# Patient Record
Sex: Male | Born: 1955
Health system: Southern US, Community
[De-identification: ages and names within clinical notes are randomized; demographics above are authoritative.]

## PROBLEM LIST (undated history)

## (undated) ENCOUNTER — Inpatient Hospital Stay: Admission: EM | Payer: Self-pay

## (undated) DIAGNOSIS — E119 Type 2 diabetes mellitus without complications: Secondary | ICD-10-CM

## (undated) DIAGNOSIS — G4730 Sleep apnea, unspecified: Secondary | ICD-10-CM

## (undated) DIAGNOSIS — E785 Hyperlipidemia, unspecified: Secondary | ICD-10-CM

## (undated) DIAGNOSIS — F17200 Nicotine dependence, unspecified, uncomplicated: Secondary | ICD-10-CM

## (undated) DIAGNOSIS — G629 Polyneuropathy, unspecified: Secondary | ICD-10-CM

## (undated) DIAGNOSIS — C3490 Malignant neoplasm of unspecified part of unspecified bronchus or lung: Secondary | ICD-10-CM

## (undated) DIAGNOSIS — I10 Essential (primary) hypertension: Secondary | ICD-10-CM

## (undated) DIAGNOSIS — C801 Malignant (primary) neoplasm, unspecified: Secondary | ICD-10-CM

## (undated) DIAGNOSIS — G709 Myoneural disorder, unspecified: Secondary | ICD-10-CM

## (undated) HISTORY — DX: Sleep apnea, unspecified: G47.30

## (undated) HISTORY — DX: Hyperlipidemia, unspecified: E78.5

## (undated) HISTORY — DX: Type 2 diabetes mellitus without complications: E11.9

## (undated) HISTORY — PX: COLONOSCOPY: SHX174

## (undated) HISTORY — DX: Malignant (primary) neoplasm, unspecified: C80.1

## (undated) HISTORY — PX: PROSTATECTOMY: SHX69

## (undated) HISTORY — DX: Nicotine dependence, unspecified, uncomplicated: F17.200

## (undated) HISTORY — DX: Essential (primary) hypertension: I10

## (undated) HISTORY — PX: BACK SURGERY: SHX140

## (undated) HISTORY — DX: Myoneural disorder, unspecified: G70.9

---

## 2002-08-03 ENCOUNTER — Ambulatory Visit (HOSPITAL_BASED_OUTPATIENT_CLINIC_OR_DEPARTMENT_OTHER): Admission: RE | Admit: 2002-08-03 | Discharge: 2002-08-03 | Payer: Self-pay | Admitting: Internal Medicine

## 2003-06-13 ENCOUNTER — Ambulatory Visit (HOSPITAL_BASED_OUTPATIENT_CLINIC_OR_DEPARTMENT_OTHER): Admission: RE | Admit: 2003-06-13 | Discharge: 2003-06-13 | Payer: Self-pay | Admitting: Orthopedic Surgery

## 2006-01-28 ENCOUNTER — Encounter: Admission: RE | Admit: 2006-01-28 | Discharge: 2006-01-28 | Payer: Self-pay | Admitting: Internal Medicine

## 2006-07-25 ENCOUNTER — Encounter: Admission: RE | Admit: 2006-07-25 | Discharge: 2006-07-25 | Payer: Self-pay | Admitting: Internal Medicine

## 2009-09-30 ENCOUNTER — Encounter: Admission: RE | Admit: 2009-09-30 | Discharge: 2009-09-30 | Payer: Self-pay | Admitting: Internal Medicine

## 2010-08-05 ENCOUNTER — Encounter (INDEPENDENT_AMBULATORY_CARE_PROVIDER_SITE_OTHER): Payer: Self-pay | Admitting: Urology

## 2010-08-05 ENCOUNTER — Inpatient Hospital Stay (HOSPITAL_COMMUNITY)
Admission: RE | Admit: 2010-08-05 | Discharge: 2010-08-07 | Payer: Self-pay | Source: Home / Self Care | Attending: Urology | Admitting: Urology

## 2010-09-22 ENCOUNTER — Encounter: Payer: Self-pay | Admitting: Internal Medicine

## 2010-09-22 ENCOUNTER — Ambulatory Visit (INDEPENDENT_AMBULATORY_CARE_PROVIDER_SITE_OTHER): Payer: PRIVATE HEALTH INSURANCE | Admitting: Internal Medicine

## 2010-09-22 VITALS — BP 120/82 | HR 86 | Ht 68.5 in | Wt 189.0 lb

## 2010-09-22 DIAGNOSIS — E785 Hyperlipidemia, unspecified: Secondary | ICD-10-CM

## 2010-09-22 DIAGNOSIS — E1169 Type 2 diabetes mellitus with other specified complication: Secondary | ICD-10-CM | POA: Insufficient documentation

## 2010-09-22 DIAGNOSIS — F17200 Nicotine dependence, unspecified, uncomplicated: Secondary | ICD-10-CM | POA: Insufficient documentation

## 2010-09-22 DIAGNOSIS — Z79899 Other long term (current) drug therapy: Secondary | ICD-10-CM

## 2010-09-22 DIAGNOSIS — E781 Pure hyperglyceridemia: Secondary | ICD-10-CM

## 2010-09-22 DIAGNOSIS — Z9889 Other specified postprocedural states: Secondary | ICD-10-CM | POA: Insufficient documentation

## 2010-09-22 DIAGNOSIS — C61 Malignant neoplasm of prostate: Secondary | ICD-10-CM | POA: Insufficient documentation

## 2010-09-22 DIAGNOSIS — G5600 Carpal tunnel syndrome, unspecified upper limb: Secondary | ICD-10-CM | POA: Insufficient documentation

## 2010-09-22 LAB — BASIC METABOLIC PANEL
BUN: 12 mg/dL (ref 6–23)
CO2: 30 mEq/L (ref 19–32)
Calcium: 9.4 mg/dL (ref 8.4–10.5)
Chloride: 106 mEq/L (ref 96–112)
Creatinine, Ser: 0.9 mg/dL (ref 0.4–1.5)
GFR: 92.93 mL/min (ref >60.00)
Glucose, Bld: 155 mg/dL — ABNORMAL HIGH (ref 70–99)
Potassium: 4.2 mEq/L (ref 3.5–5.1)
Sodium: 142 mEq/L (ref 135–145)

## 2010-09-22 LAB — HEPATIC FUNCTION PANEL
ALT: 26 U/L (ref 0–53)
AST: 25 U/L (ref 0–37)
Albumin: 4.4 g/dL (ref 3.5–5.2)
Alkaline Phosphatase: 76 U/L (ref 39–117)
Bilirubin, Direct: 0 mg/dL (ref 0.0–0.3)
Total Bilirubin: 0.2 mg/dL — ABNORMAL LOW (ref 0.3–1.2)
Total Protein: 7.2 g/dL (ref 6.0–8.3)

## 2010-09-22 LAB — LIPID PANEL
Cholesterol: 114 mg/dL (ref 0–200)
HDL: 18.3 mg/dL — ABNORMAL LOW (ref >39.00)
Total CHOL/HDL Ratio: 6
Triglycerides: 816 mg/dL — ABNORMAL HIGH (ref 0.0–149.0)
VLDL: 163.2 mg/dL — ABNORMAL HIGH (ref 0.0–40.0)

## 2010-09-22 LAB — LDL CHOLESTEROL, DIRECT: Direct LDL: 25 mg/dL

## 2010-09-22 MED ORDER — ROSUVASTATIN CALCIUM 10 MG PO TABS: 10.0000 mg | ORAL_TABLET | Freq: Every day | ORAL | Status: DC

## 2010-09-22 NOTE — Assessment & Plan Note (Signed)
Obtain fasting lipid profile and liver function tests. Cost may be a consideration for Crestor according to patient. Consider possible Lipitor if Crestor is too expensive. Continue Fish oil.  Recommend low fat diet and regular aerobic exercise thirty minutes at least four days a week. Obtain Chem-7.

## 2010-09-22 NOTE — Assessment & Plan Note (Signed)
Long discussion held. Tobacco cessation reviewed and recommended. Declines Chantix. Discussed Zyban. Wishes to attempt nicotine patches. Instructed to avoid smoking while using patches.

## 2010-09-22 NOTE — Assessment & Plan Note (Signed)
Stable. Status post recent prostatectomy with undetectable PSA. Continue surveillance per urology.

## 2010-09-22 NOTE — Progress Notes (Signed)
  Subjective:    Patient ID: Gregory Jensen, male    DOB: 1955/11/03, 55 y.o.   MRN: [id number]  HPI  Patient presents to clinic to establish primary medical care and for followup of hyperlipidemia. Has long-standing history of hyperlipidemia primarily hypertriglyceridemia currently taking Crestor 10 mg a day with samples. Also takes  Fish oil  Approximately 2400 mg daily. States untreated his triglycerides are approximately 3000 and treated  are approximately five hundred-eight hundred.  Tolerates statin therapy without myalgias or abnormal LFTs. T-System approximately one and half packs a serous daily but is ready for cessation. Wishes to avoid Chantix. States had unremarkable chest x-ray approximately 5 months ago. Believes underwent colonoscopy approximately age 12 without polyps and no family history of colon cancer.  Recently diagnosed with prostate cancer and underwent prostatectomy in December 2011. Followed closely by urology and recalls undetectable PSA within the past week.     reviewed past medical history, problem list, medications, allergies, family history and social history  Review of Systems  Constitutional: Negative for fever, chills, activity change and fatigue.  HENT: Negative for hearing loss, congestion and voice change.   Eyes: Negative for pain, discharge and redness.  Respiratory: Negative for cough, shortness of breath and wheezing.   Cardiovascular: Negative for chest pain and palpitations.  Gastrointestinal: Negative for abdominal pain, diarrhea, constipation and blood in stool.  Musculoskeletal: Positive for back pain. Negative for joint swelling and arthralgias.  Skin: Negative for color change and rash.  Neurological: Negative for dizziness, seizures, syncope and headaches.  Hematological: Negative for adenopathy. Does not bruise/bleed easily.  Psychiatric/Behavioral: Negative for behavioral problems and agitation.       Objective:   Physical Exam    Constitutional: He appears well-developed and well-nourished. No distress.  HENT:  Head: Normocephalic and atraumatic.  Right Ear: External ear normal.  Left Ear: External ear normal.  Nose: Nose normal.  Mouth/Throat: Oropharynx is clear and moist. No oropharyngeal exudate.  Eyes: EOM are normal. Pupils are equal, round, and reactive to light. Right eye exhibits no discharge. Left eye exhibits no discharge. No scleral icterus.  Neck: Normal range of motion. Neck supple. No JVD present.  Cardiovascular: Normal rate, regular rhythm and normal heart sounds.  Exam reveals no gallop and no friction rub.   No murmur heard. Pulmonary/Chest: No respiratory distress. He has no wheezes. He has no rales.  Abdominal: Soft. He exhibits no distension and no mass. There is no tenderness. There is no rebound and no guarding.  Musculoskeletal: Normal range of motion.  Lymphadenopathy:    He has no cervical adenopathy.  Neurological: He is alert.  Skin: Skin is warm and dry. No rash noted. He is not diaphoretic. No erythema. No pallor.  Psychiatric: He has a normal mood and affect. His behavior is normal.          Assessment & Plan:

## 2010-09-24 ENCOUNTER — Telehealth: Payer: Self-pay

## 2010-09-24 NOTE — Telephone Encounter (Signed)
Message copied by Kyung Rudd on Thu Sep 24, 2010  4:49 PM ------      Message from: Letitia Libra, Maisie Fus      Created: Thu Sep 24, 2010  4:28 PM       TG 800. Cont crestor. inrease fish oil to at least 3000mg  daily. (believe is taking around 2000mg ). Blood sugar mildly high. Low sugar/carb diet and regular exercise at least 3x/week. Will recheck sugar at next visit

## 2010-09-24 NOTE — Telephone Encounter (Signed)
Pt aware and verbalized understanding.  

## 2010-10-19 ENCOUNTER — Telehealth: Payer: Self-pay | Admitting: Internal Medicine

## 2010-10-19 NOTE — Telephone Encounter (Signed)
Pt would like to have a cheaper med to replace Crestor sent to [facility] [place]... Pt has taken Simvastatin before and it worked well.... Could it be switched to this?  Pt can be reached at [phone].

## 2010-10-20 MED ORDER — ATORVASTATIN CALCIUM 40 MG PO TABS: 40.0000 mg | ORAL_TABLET | Freq: Every day | ORAL | Status: DC

## 2010-10-20 NOTE — Telephone Encounter (Signed)
Recommend lipitor 40mg  qd.

## 2010-10-20 NOTE — Telephone Encounter (Signed)
Med escribed to [facility]. Pt aware

## 2010-10-26 LAB — DIFFERENTIAL
Basophils Absolute: 0 10*3/uL (ref 0.0–0.1)
Basophils Absolute: 0.1 10*3/uL (ref 0.0–0.1)
Basophils Relative: 0 % (ref 0–1)
Basophils Relative: 0 % (ref 0–1)
Eosinophils Absolute: 0 10*3/uL (ref 0.0–0.7)
Eosinophils Absolute: 0 10*3/uL (ref 0.0–0.7)
Eosinophils Relative: 0 % (ref 0–5)
Eosinophils Relative: 0 % (ref 0–5)
Lymphocytes Relative: 13 % (ref 12–46)
Lymphocytes Relative: 15 % (ref 12–46)
Lymphs Abs: 1.6 10*3/uL (ref 0.7–4.0)
Lymphs Abs: 2 10*3/uL (ref 0.7–4.0)
Monocytes Absolute: 0.2 10*3/uL (ref 0.1–1.0)
Monocytes Absolute: 0.7 10*3/uL (ref 0.1–1.0)
Monocytes Relative: 2 % — ABNORMAL LOW (ref 3–12)
Monocytes Relative: 6 % (ref 3–12)
Neutro Abs: 11.3 10*3/uL — ABNORMAL HIGH (ref 1.7–7.7)
Neutro Abs: 9.8 10*3/uL — ABNORMAL HIGH (ref 1.7–7.7)
Neutrophils Relative %: 80 % — ABNORMAL HIGH (ref 43–77)
Neutrophils Relative %: 83 % — ABNORMAL HIGH (ref 43–77)

## 2010-10-26 LAB — BASIC METABOLIC PANEL
BUN: 10 mg/dL (ref 6–23)
BUN: 15 mg/dL (ref 6–23)
CO2: 25 mEq/L (ref 19–32)
CO2: 25 mEq/L (ref 19–32)
Calcium: 8.2 mg/dL — ABNORMAL LOW (ref 8.4–10.5)
Calcium: 8.5 mg/dL (ref 8.4–10.5)
Chloride: 104 mEq/L (ref 96–112)
Chloride: 109 mEq/L (ref 96–112)
Creatinine, Ser: 1.03 mg/dL (ref 0.4–1.5)
Creatinine, Ser: 1.23 mg/dL (ref 0.4–1.5)
GFR calc Af Amer: >60 mL/min (ref >60)
GFR calc Af Amer: >60 mL/min (ref >60)
GFR calc non Af Amer: >60 mL/min (ref >60)
GFR calc non Af Amer: >60 mL/min (ref >60)
Glucose, Bld: 153 mg/dL — ABNORMAL HIGH (ref 70–99)
Glucose, Bld: 175 mg/dL — ABNORMAL HIGH (ref 70–99)
Potassium: 3.6 mEq/L (ref 3.5–5.1)
Potassium: 3.9 mEq/L (ref 3.5–5.1)
Sodium: 138 mEq/L (ref 135–145)
Sodium: 141 mEq/L (ref 135–145)

## 2010-10-26 LAB — CBC
HCT: 35.7 % — ABNORMAL LOW (ref 39.0–52.0)
HCT: 39 % (ref 39.0–52.0)
Hemoglobin: 12.2 g/dL — ABNORMAL LOW (ref 13.0–17.0)
Hemoglobin: 13.6 g/dL (ref 13.0–17.0)
MCH: 31.4 pg (ref 26.0–34.0)
MCH: 32.2 pg (ref 26.0–34.0)
MCHC: 34.2 g/dL (ref 30.0–36.0)
MCHC: 34.9 g/dL (ref 30.0–36.0)
MCV: 92 fL (ref 78.0–100.0)
MCV: 92.2 fL (ref 78.0–100.0)
Platelets: 138 10*3/uL — ABNORMAL LOW (ref 150–400)
Platelets: 148 10*3/uL — ABNORMAL LOW (ref 150–400)
RBC: 3.88 MIL/uL — ABNORMAL LOW (ref 4.22–5.81)
RBC: 4.23 MIL/uL (ref 4.22–5.81)
RDW: 12.2 % (ref 11.5–15.5)
RDW: 12.2 % (ref 11.5–15.5)
WBC: 12.2 10*3/uL — ABNORMAL HIGH (ref 4.0–10.5)
WBC: 13.5 10*3/uL — ABNORMAL HIGH (ref 4.0–10.5)

## 2010-10-26 LAB — TYPE AND SCREEN
ABO/RH(D): O POS
Antibody Screen: NEGATIVE

## 2010-10-26 LAB — ABO/RH: ABO/RH(D): O POS

## 2010-10-27 LAB — COMPREHENSIVE METABOLIC PANEL
ALT: 33 U/L (ref 0–53)
AST: 29 U/L (ref 0–37)
Albumin: 4.6 g/dL (ref 3.5–5.2)
Alkaline Phosphatase: 79 U/L (ref 39–117)
BUN: 13 mg/dL (ref 6–23)
CO2: 25 mEq/L (ref 19–32)
Calcium: 10.3 mg/dL (ref 8.4–10.5)
Chloride: 105 mEq/L (ref 96–112)
Creatinine, Ser: 0.96 mg/dL (ref 0.4–1.5)
GFR calc Af Amer: >60 mL/min (ref >60)
GFR calc non Af Amer: >60 mL/min (ref >60)
Glucose, Bld: 117 mg/dL — ABNORMAL HIGH (ref 70–99)
Potassium: 4.1 mEq/L (ref 3.5–5.1)
Sodium: 141 mEq/L (ref 135–145)
Total Bilirubin: 0.7 mg/dL (ref 0.3–1.2)
Total Protein: 7.8 g/dL (ref 6.0–8.3)

## 2010-10-27 LAB — CBC
HCT: 44.9 % (ref 39.0–52.0)
Hemoglobin: 16 g/dL (ref 13.0–17.0)
MCH: 32.8 pg (ref 26.0–34.0)
MCHC: 35.6 g/dL (ref 30.0–36.0)
MCV: 92 fL (ref 78.0–100.0)
Platelets: 149 10*3/uL — ABNORMAL LOW (ref 150–400)
RBC: 4.88 MIL/uL (ref 4.22–5.81)
RDW: 12.1 % (ref 11.5–15.5)
WBC: 10.3 10*3/uL (ref 4.0–10.5)

## 2010-10-27 LAB — SURGICAL PCR SCREEN
MRSA, PCR: NEGATIVE
Staphylococcus aureus: NEGATIVE

## 2011-01-01 ENCOUNTER — Ambulatory Visit (INDEPENDENT_AMBULATORY_CARE_PROVIDER_SITE_OTHER): Payer: PRIVATE HEALTH INSURANCE | Admitting: Internal Medicine

## 2011-01-01 ENCOUNTER — Encounter: Payer: Self-pay | Admitting: Internal Medicine

## 2011-01-01 DIAGNOSIS — E785 Hyperlipidemia, unspecified: Secondary | ICD-10-CM

## 2011-01-01 DIAGNOSIS — L0390 Cellulitis, unspecified: Secondary | ICD-10-CM

## 2011-01-01 DIAGNOSIS — L0291 Cutaneous abscess, unspecified: Secondary | ICD-10-CM

## 2011-01-01 MED ORDER — SULFAMETHOXAZOLE-TRIMETHOPRIM 800-160 MG PO TABS: 1.0000 | ORAL_TABLET | Freq: Two times a day (BID) | ORAL | Status: AC

## 2011-01-01 MED ORDER — SIMVASTATIN 40 MG PO TABS: 40.0000 mg | ORAL_TABLET | Freq: Every day | ORAL | Status: DC

## 2011-01-01 NOTE — Op Note (Signed)
NAME:  Gregory Jensen, Gregory Jensen                     ACCOUNT NO.:  [id number]   MEDICAL RECORD NO.:  [id number]                   PATIENT TYPE:  AMB   LOCATION:  [facility]                                  FACILITY:  [facility]   PHYSICIAN:  Katy Fitch. Naaman Plummer., M.D.          DATE OF BIRTH:  04/28/1956   DATE OF PROCEDURE:  06/13/2003  DATE OF DISCHARGE:                                 OPERATIVE REPORT   PREOPERATIVE DIAGNOSES:  1. Entrapment neuropathy, median nerve, right carpal tunnel, with positive     electrodiagnostic studies.  2. Enlarging mass of palmar aspect of right thumb at A-1 pulley with     stenosing tenosynovitis signs, rule out ganglion and stenosing     tenosynovitis at A-1 pulley.  3. Right dorsal ganglion.   POSTOPERATIVE DIAGNOSES:  1. Entrapment neuropathy, median nerve, right carpal tunnel, with positive     electrodiagnostic studies.  2. Enlarging mass of palmar aspect of right thumb at A-1 pulley with     stenosing tenosynovitis signs, rule out ganglion and stenosing     tenosynovitis at A-1 pulley.  3. Right dorsal ganglion.   OPERATION:  1. Release of right transverse carpal ligament.  2. Through separate incision, excision of ganglion from A-1 pulley, right     thumb, followed by release of right thumb A-1 pulley.  3. Through separate incision, excision of large dorsal ganglion overlying     scapholunate interosseous ligament.   OPERATING SURGEON:  Katy Fitch. Sypher, M.D.   ASSISTANT:  Jonni Sanger, P.A.   ANESTHESIA:  General by LMA.   SUPERVISING ANESTHESIOLOGIST:  Janetta Hora. Gelene Mink, M.D.   INDICATIONS:  Gregory Jensen is a 55 year old gentleman employed by  [employer].  He presented for evaluation of a painful hand with a number of  bumps and numbness symptoms.  Clinical examination revealed four  predicaments:  #1 - He had signs of carpal tunnel syndrome; #2 = he had a  mass at the palmar aspect of his right thumb A-1 pulley with triggering; #3  - he had a mass on the dorsal aspect of his wrist; and #4 - he had early  Dupuytren's contracture.  Electrodiagnostic studies confirmed significant  entrapment neuropathy.   After informed consent, he is brought to the operating room at this time  with plans to remedy three of the four predicaments.  He understands his  Dupuytren's contracture may progress over time.   PROCEDURE:  Gregory Jensen was brought to the operating room and placed  in a supine position upon the operating table.  Following induction of  general anesthesia by LMA, the right arm was prepped with Betadine soaping  solution and sterilely draped.  Following exsanguination of the limb with an  Esmarch bandage, the arterial tourniquet was inflated to 230 mmHg.  Procedure commenced with an initial short incision in the line of the ring  finger and palm.  Subcutaneous tissues were carefully divided, revealing the  palmar fascia.  This was split longitudinally to reveal a massive amount of  fat in the mid-palmar space.  Careful dissection revealed the common sensory  branches of the median nerve and the superficial palmar arch.  Due to the  degree of fat, we needed to extend the incision to approximately 3 cm to  allow safe visualization of all the vital structures.   The common sensory branches were followed back to the median nerve proper,  which was gently isolated from the transverse carpal ligament.  The ligament  was then released along its ulnar border with scissors, extending into the  distal forearm.  This widely opened the carpal canal.   The nerve was under significant compression at the distal margin of the  transverse carpal ligament.   This wound was then repaired with two segmental intradermal 3-0 Prolene  sutures.  Attention was then directed to the thumb.  A short transverse  incision was fashioned directly over the palpable mass.  Subcutaneous  tissues were carefully divided, revealing a ganglion  with blood staining  growing off the A-1 pulley.  This was removed with a rongeur, followed by  release of the A-1 pulley.  The tendon was delivered and found to be  otherwise normal.  Thereafter, free range of motion of the IP joint was  recovered.  This wound was repaired with intradermal 3-0 Prolene.   A third incision was fashioned on the dorsal aspect of the wrist directly  over the palpable mass.  Subcutaneous tissues were carefully divided,  revealing the extensor retinaculum.  The radial wrist extensors were  retracted in a radial direction, the fourth dorsal compartment tendons in an  ulnar direction and the capsule dissected.  A large sessile dorsal ganglion  measuring 2.5 cm in maximum width was circumferentially dissected and  removed with a portion of the capsule.  This directly exposed the  scapholunate interosseous ligament.  The ligament appeared normal.  Bleeding  points were electrocauterized with bipolar current.  All abnormal tissues  were carefully removed with the rongeur.   This wound was then repaired with intradermal 3-0 Prolene, allowing the  tendons to return to an anatomic position.   All wounds were dressed with Steri-Strips, followed by sterile gauze,  sterile Webril and a volar plaster splint.  There were no apparent  complications.   NOTE:  For aftercare, he will return to our office for followup in one week.  He is given prescriptions for Percocet 5 mg one or two tablets q.4-6 h.  p.r.n. pain, a total of 24 tablets without refill, also Keflex 500 mg one  p.o. q.8 h. x4 days as a prophylactic antibiotic.                                               Katy Fitch Naaman Plummer., M.D.    RVS/MEDQ  D:  06/13/2003  T:  06/13/2003  Job:  [id number]

## 2011-01-02 ENCOUNTER — Encounter: Payer: Self-pay | Admitting: Internal Medicine

## 2011-01-02 NOTE — Assessment & Plan Note (Signed)
stop Lipitor because of cost. Patient requests Zocor and we'll attempt 40 mg daily. Schedule followup in approximately6-8 weeks.

## 2011-01-02 NOTE — Progress Notes (Signed)
  Subjective:    Patient ID: Gregory Jensen, male    DOB: [birth year], 55 y.o.   MRN: [id number]  HPI Patient presents to clinic for evaluation of skin abscesses. States has had recurrent skin abscesses along her lower abdomen roughly at his belt line. Has had a total of 5. This recently developed one in the left lower area initially larger than today and temporarily had spontaneous minimal discharge of pus. The area now is smaller in size without discharge and remains erythematous. No fever or chills. No known history of MRSA however was told by his urologist that he needs to be tested for it. No exacerbating or alleviating factors. Infection occurs in no other locations. Also has history of hyperlipidemia and was changed from Crestor to Lipitor for cost consideration.Unfortunately even with generic Lipitor the medication is quite expensive. He tolerates it without myalgias. Has a history of predominantly elevated triglycerides also taking fish oil. No other complaints  Reviewed past medical history, medications and allergies    Review of Systems see history of present illness     Objective:   Physical Exam  Nursing note and vitals reviewed. Constitutional: He appears well-developed and well-nourished. No distress.  HENT:  Head: Normocephalic and atraumatic.  Right Ear: External ear normal.  Left Ear: External ear normal.  Eyes: Conjunctivae are normal. No scleral icterus.  Neurological: He is alert.  Skin: Skin is warm and dry. No rash noted. He is not diaphoretic. There is erythema. No pallor.       Left lower abdomen reveals erythematous raised soft tissue mass approximately 1 cm in transverse diameter. No expressible discharge. Slightly tender to palpation. No warmth.          Assessment & Plan:

## 2011-01-02 NOTE — Assessment & Plan Note (Signed)
Recurrent. Not currently amenable to I&D. Agree with consideration of MRSA given recurrent nature however discourage external skin swab culture. In future if as abscess amenable  I&D will obtain wound culture. Begin Septra DS b.i.d. x10 days.Followup if no improvement or worsening.

## 2011-01-21 ENCOUNTER — Ambulatory Visit: Payer: PRIVATE HEALTH INSURANCE | Admitting: Internal Medicine

## 2011-02-26 ENCOUNTER — Encounter: Payer: Self-pay | Admitting: Internal Medicine

## 2011-02-26 ENCOUNTER — Ambulatory Visit (INDEPENDENT_AMBULATORY_CARE_PROVIDER_SITE_OTHER): Payer: PRIVATE HEALTH INSURANCE | Admitting: Internal Medicine

## 2011-02-26 DIAGNOSIS — F17200 Nicotine dependence, unspecified, uncomplicated: Secondary | ICD-10-CM

## 2011-02-26 DIAGNOSIS — E781 Pure hyperglyceridemia: Secondary | ICD-10-CM

## 2011-02-26 MED ORDER — VARENICLINE TARTRATE 1 MG PO TABS: 1.0000 mg | ORAL_TABLET | Freq: Two times a day (BID) | ORAL | Status: DC

## 2011-02-26 MED ORDER — VARENICLINE TARTRATE 1 MG PO TABS: 1.0000 mg | ORAL_TABLET | Freq: Two times a day (BID) | ORAL | Status: AC

## 2011-02-26 MED ORDER — VARENICLINE TARTRATE 0.5 MG X 11 & 1 MG X 42 PO MISC: ORAL | Status: AC

## 2011-02-26 NOTE — Progress Notes (Signed)
  Subjective:    Patient ID: Gregory Jensen, male    DOB: [birth year], 55 y.o.   MRN: [id number]  HPI  55 year old patient who has a history of dyslipidemia. He is on statin therapy. He has a family history of coronary artery disease. Sr. age 56 is status post CABG as well as aortic valve repair a brother age 44 has had a recent MI. He continues to smoke.    Review of Systems  Constitutional: Negative for fever, chills, appetite change and fatigue.  HENT: Negative for hearing loss, ear pain, congestion, sore throat, trouble swallowing, neck stiffness, dental problem, voice change and tinnitus.   Eyes: Negative for pain, discharge and visual disturbance.  Respiratory: Negative for cough, chest tightness, wheezing and stridor.   Cardiovascular: Negative for chest pain, palpitations and leg swelling.  Gastrointestinal: Negative for nausea, vomiting, abdominal pain, diarrhea, constipation, blood in stool and abdominal distention.  Genitourinary: Negative for urgency, hematuria, flank pain, discharge, difficulty urinating and genital sores.  Musculoskeletal: Negative for myalgias, back pain, joint swelling, arthralgias and gait problem.  Skin: Negative for rash.       Skin and soft tissue infections have stabilized  Neurological: Negative for dizziness, syncope, speech difficulty, weakness, numbness and headaches.  Hematological: Negative for adenopathy. Does not bruise/bleed easily.  Psychiatric/Behavioral: Negative for behavioral problems and dysphoric mood. The patient is not nervous/anxious.        Objective:   Physical Exam  Constitutional: He is oriented to person, place, and time. He appears well-developed.  HENT:  Head: Normocephalic.  Right Ear: External ear normal.  Left Ear: External ear normal.  Eyes: Conjunctivae and EOM are normal.  Neck: Normal range of motion.  Cardiovascular: Normal rate and normal heart sounds.   Pulmonary/Chest: Breath sounds normal.  Abdominal:  Bowel sounds are normal.  Musculoskeletal: Normal range of motion. He exhibits no edema and no tenderness.  Neurological: He is alert and oriented to person, place, and time.  Psychiatric: He has a normal mood and affect. His behavior is normal.          Assessment & Plan:    Dyslipidemia. Will continue simvastatin 40 Tobacco abuse. Will give a prescription for Chantix he will try nicotine replacement measures initially  Exercise weight loss encouraged  Recheck 6 months

## 2011-02-26 NOTE — Patient Instructions (Signed)
It is important that you exercise regularly, at least 20 minutes 3 to 4 times per week.  If you develop chest pain or shortness of breath seek  medical attention.  You need to lose weight.  Consider a lower calorie diet and regular exercise.  Smoking tobacco is very bad for your health. You should stop smoking immediately.  CPX in 6 months

## 2011-03-15 ENCOUNTER — Encounter: Payer: Self-pay | Admitting: Family Medicine

## 2011-03-15 ENCOUNTER — Ambulatory Visit (INDEPENDENT_AMBULATORY_CARE_PROVIDER_SITE_OTHER): Payer: PRIVATE HEALTH INSURANCE | Admitting: Family Medicine

## 2011-03-15 VITALS — BP 130/78 | HR 88 | Temp 98.6°F | Wt 190.0 lb

## 2011-03-15 DIAGNOSIS — J329 Chronic sinusitis, unspecified: Secondary | ICD-10-CM

## 2011-03-15 MED ORDER — AZITHROMYCIN 250 MG PO TABS: ORAL_TABLET | ORAL | Status: AC

## 2011-03-15 NOTE — Progress Notes (Signed)
  Subjective:    Patient ID: Synetta Fail, male    DOB: 02-12-1956, 55 y.o.   MRN: [id number]  HPI Here for 4 days of sinus pressure, PND, ST, and a dry cough, No fever.    Review of Systems  Constitutional: Negative.   HENT: Positive for congestion, postnasal drip and sinus pressure.   Eyes: Negative.   Respiratory: Positive for cough.        Objective:   Physical Exam  Constitutional: He appears well-nourished.  HENT:  Right Ear: External ear normal.  Left Ear: External ear normal.  Nose: Nose normal.  Mouth/Throat: Oropharynx is clear and moist. No oropharyngeal exudate.  Eyes: Conjunctivae are normal. Pupils are equal, round, and reactive to light.  Neck: Neck supple. No thyromegaly present.  Pulmonary/Chest: Effort normal and breath sounds normal.  Lymphadenopathy:    He has no cervical adenopathy.          Assessment & Plan:   Add Mucinex prn

## 2011-08-23 ENCOUNTER — Other Ambulatory Visit (INDEPENDENT_AMBULATORY_CARE_PROVIDER_SITE_OTHER): Payer: PRIVATE HEALTH INSURANCE

## 2011-08-23 ENCOUNTER — Other Ambulatory Visit: Payer: Self-pay | Admitting: Internal Medicine

## 2011-08-23 DIAGNOSIS — Z0000 Encounter for general adult medical examination without abnormal findings: Secondary | ICD-10-CM

## 2011-08-23 LAB — LIPID PANEL
Cholesterol: 226 mg/dL — ABNORMAL HIGH (ref 0–200)
HDL: 15.9 mg/dL — ABNORMAL LOW (ref >39.00)
Total CHOL/HDL Ratio: 14
Triglycerides: 1236 mg/dL — ABNORMAL HIGH (ref 0.0–149.0)
VLDL: 247.2 mg/dL — ABNORMAL HIGH (ref 0.0–40.0)

## 2011-08-23 LAB — POCT URINALYSIS DIPSTICK
Glucose, UA: NEGATIVE
Leukocytes, UA: NEGATIVE
Nitrite, UA: NEGATIVE
Spec Grav, UA: >=1.03
Urobilinogen, UA: 0.2
pH, UA: 5

## 2011-08-23 LAB — CBC WITH DIFFERENTIAL/PLATELET
Basophils Absolute: 0 10*3/uL (ref 0.0–0.1)
Basophils Relative: 0.4 % (ref 0.0–3.0)
Eosinophils Absolute: 0.1 10*3/uL (ref 0.0–0.7)
Eosinophils Relative: 1.8 % (ref 0.0–5.0)
HCT: 41.9 % (ref 39.0–52.0)
Hemoglobin: 14.8 g/dL (ref 13.0–17.0)
Lymphocytes Relative: 26.9 % (ref 12.0–46.0)
Lymphs Abs: 2.1 10*3/uL (ref 0.7–4.0)
MCHC: 35.3 g/dL (ref 30.0–36.0)
MCV: 95 fl (ref 78.0–100.0)
Monocytes Absolute: 0.5 10*3/uL (ref 0.1–1.0)
Monocytes Relative: 6.3 % (ref 3.0–12.0)
Neutro Abs: 5.1 10*3/uL (ref 1.4–7.7)
Neutrophils Relative %: 64.6 % (ref 43.0–77.0)
Platelets: 130 10*3/uL — ABNORMAL LOW (ref 150.0–400.0)
RBC: 4.42 Mil/uL (ref 4.22–5.81)
RDW: 12.6 % (ref 11.5–14.6)
WBC: 7.9 10*3/uL (ref 4.5–10.5)

## 2011-08-23 LAB — HEPATIC FUNCTION PANEL
ALT: 5 U/L (ref 0–53)
AST: 32 U/L (ref 0–37)
Albumin: 4.3 g/dL (ref 3.5–5.2)
Alkaline Phosphatase: 73 U/L (ref 39–117)
Bilirubin, Direct: 0 mg/dL (ref 0.0–0.3)
Total Bilirubin: 0.5 mg/dL (ref 0.3–1.2)
Total Protein: 6.6 g/dL (ref 6.0–8.3)

## 2011-08-23 LAB — BASIC METABOLIC PANEL
BUN: 20 mg/dL (ref 6–23)
CO2: 26 mEq/L (ref 19–32)
Calcium: 9.1 mg/dL (ref 8.4–10.5)
Chloride: 105 mEq/L (ref 96–112)
Creatinine, Ser: 0.9 mg/dL (ref 0.4–1.5)
GFR: 95.06 mL/min (ref >60.00)
Glucose, Bld: 138 mg/dL — ABNORMAL HIGH (ref 70–99)
Potassium: 4 mEq/L (ref 3.5–5.1)
Sodium: 140 mEq/L (ref 135–145)

## 2011-08-23 LAB — LDL CHOLESTEROL, DIRECT: Direct LDL: 26.4 mg/dL

## 2011-08-23 LAB — PSA: PSA: 0.01 ng/mL — ABNORMAL LOW (ref 0.10–4.00)

## 2011-08-23 LAB — TSH: TSH: 3.4 u[IU]/mL (ref 0.35–5.50)

## 2011-08-23 MED ORDER — FENOFIBRATE 160 MG PO TABS: 160.0000 mg | ORAL_TABLET | Freq: Every day | ORAL | Status: DC

## 2011-08-24 ENCOUNTER — Other Ambulatory Visit: Payer: Self-pay | Admitting: Internal Medicine

## 2011-08-24 DIAGNOSIS — E785 Hyperlipidemia, unspecified: Secondary | ICD-10-CM

## 2011-08-24 NOTE — Progress Notes (Signed)
Quick Note:  Attempt to call- VM - left instructions r/t lab and med at pharm - repeat lab in 3 month , need to call and schedule lab appt - fasting in 3 months to check lipids ______

## 2011-08-30 ENCOUNTER — Encounter: Payer: Self-pay | Admitting: Internal Medicine

## 2011-08-30 ENCOUNTER — Ambulatory Visit (INDEPENDENT_AMBULATORY_CARE_PROVIDER_SITE_OTHER): Payer: PRIVATE HEALTH INSURANCE | Admitting: Internal Medicine

## 2011-08-30 DIAGNOSIS — C61 Malignant neoplasm of prostate: Secondary | ICD-10-CM

## 2011-08-30 DIAGNOSIS — Z0000 Encounter for general adult medical examination without abnormal findings: Secondary | ICD-10-CM

## 2011-08-30 DIAGNOSIS — E781 Pure hyperglyceridemia: Secondary | ICD-10-CM

## 2011-08-30 DIAGNOSIS — R079 Chest pain, unspecified: Secondary | ICD-10-CM

## 2011-08-30 DIAGNOSIS — F17200 Nicotine dependence, unspecified, uncomplicated: Secondary | ICD-10-CM

## 2011-08-30 NOTE — Patient Instructions (Signed)
Limit your sodium (Salt) intake    It is important that you exercise regularly, at least 20 minutes 3 to 4 times per week.  If you develop chest pain or shortness of breath seek  medical attention.  You need to lose weight.  Consider a lower calorie diet and regular exercise.  Add omega-3 fatty acids in your diet a   followup lipid profile in 3 monthsHypertriglyceridemia   Diet for High blood levels of Triglycerides Most fats in food are triglycerides. Triglycerides in your blood are stored as fat in your body. High levels of triglycerides in your blood may put you at a greater risk for heart disease and stroke.   Normal triglyceride levels are less than 150 mg/dL. Borderline high levels are 150-199 mg/dl. High levels are 200 - 499 mg/dL, and very high triglyceride levels are greater than 500 mg/dL. The decision to treat high triglycerides is generally based on the level. For people with borderline or high triglyceride levels, treatment includes weight loss and exercise. Drugs are recommended for people with very high triglyceride levels. Many people who need treatment for high triglyceride levels have metabolic syndrome. This syndrome is a collection of disorders that often include: insulin resistance, high blood pressure, blood clotting problems, high cholesterol and triglycerides. TESTING PROCEDURE FOR TRIGLYCERIDES  You should not eat 4 hours before getting your triglycerides measured. The normal range of triglycerides is between 10 and 250 milligrams per deciliter (mg/dl). Some people may have extreme levels (1000 or above), but your triglyceride level may be too high if it is above 150 mg/dl, depending on what other risk factors you have for heart disease.     People with high blood triglycerides may also have high blood cholesterol levels. If you have high blood cholesterol as well as high blood triglycerides, your risk for heart disease is probably greater than if you only had high  triglycerides. High blood cholesterol is one of the main risk factors for heart disease.  CHANGING YOUR DIET   Your weight can affect your blood triglyceride level. If you are more than 20% above your ideal body weight, you may be able to lower your blood triglycerides by losing weight. Eating less and exercising regularly is the best way to combat this. Fat provides more calories than any other food. The best way to lose weight is to eat less fat. Only 30% of your total calories should come from fat. Less than 7% of your diet should come from saturated fat. A diet low in fat and saturated fat is the same as a diet to decrease blood cholesterol. By eating a diet lower in fat, you may lose weight, lower your blood cholesterol, and lower your blood triglyceride level.   Eating a diet low in fat, especially saturated fat, may also help you lower your blood triglyceride level. Ask your dietitian to help you figure how much fat you can eat based on the number of calories your caregiver has prescribed for you.   Exercise, in addition to helping with weight loss may also help lower triglyceride levels.   Alcohol can increase blood triglycerides. You may need to stop drinking alcoholic beverages.     Too much carbohydrate in your diet may also increase your blood triglycerides. Some complex carbohydrates are necessary in your diet. These may include bread, rice, potatoes, other starchy vegetables and cereals.     Reduce "simple" carbohydrates. These may include pure sugars, candy, honey, and jelly without losing other nutrients. If  you have the kind of high blood triglycerides that is affected by the amount of carbohydrates in your diet, you will need to eat less sugar and less high-sugar foods. Your caregiver can help you with this.     Adding 2-4 grams of fish oil (EPA+ DHA) may also help lower triglycerides. Speak with your caregiver before adding any supplements to your regimen.  Following the Diet    Maintain your ideal weight. Your caregivers can help you with a diet. Generally, eating less food and getting more exercise will help you lose weight. Joining a weight control group may also help. Ask your caregivers for a good weight control group in your area.   Eat low-fat foods instead of high-fat foods. This can help you lose weight too.   These foods are lower in fat. Eat MORE of these:   Dried beans, peas, and lentils.     Egg whites.     Low-fat cottage cheese.     Fish.    Lean cuts of meat, such as round, sirloin, rump, and flank (cut extra fat off meat you fix).     Whole grain breads, cereals and pasta.     Skim and nonfat dry milk.     Low-fat yogurt.     Poultry without the skin.     Cheese made with skim or part-skim milk, such as mozzarella, parmesan, farmers', ricotta, or pot cheese.  These are higher fat foods. Eat LESS of these:    Whole milk and foods made from whole milk, such as American, blue, cheddar, monterey jack, and swiss cheese     High-fat meats, such as luncheon meats, sausages, knockwurst, bratwurst, hot dogs, ribs, corned beef, ground pork, and regular ground beef.     Fried foods.  Limit saturated fats in your diet. Substituting unsaturated fat for saturated fat may decrease your blood triglyceride level. You will need to read package labels to know which products contain saturated fats.   These foods are high in saturated fat. Eat LESS of these:   Fried pork skins.     Whole milk.     Skin and fat from poultry.     Palm oil.     Butter.    Shortening.    Cream cheese.     Tomasa Blase.    Margarines and baked goods made from listed oils.     Vegetable shortenings.     Chitterlings.    Fat from meats.     Coconut oil.     Palm kernel oil.     Lard.    Cream.    Sour cream.     Fatback.    Coffee whiteners and non-dairy creamers made with these oils.     Cheese made from whole milk.  Use unsaturated fats (both  polyunsaturated and monounsaturated) moderately. Remember, even though unsaturated fats are better than saturated fats; you still want a diet low in total fat.   These foods are high in unsaturated fat:   Canola oil.     Sunflower oil.     Mayonnaise.    Almonds.    Peanuts.    Pine nuts.     Margarines made with these oils.     Safflower oil.     Olive oil.     Avocados.    Cashews.    Peanut butter.     Sunflower seeds.     Soybean oil.     Peanut oil.  Olives.    Pecans.    Walnuts.    Pumpkin seeds.  Avoid sugar and other high-sugar foods. This will decrease carbohydrates without decreasing other nutrients. Sugar in your food goes rapidly to your blood. When there is excess sugar in your blood, your liver may use it to make more triglycerides. Sugar also contains calories without other important nutrients.   Eat LESS of these:   Sugar, brown sugar, powdered sugar, jam, jelly, preserves, honey, syrup, molasses, pies, candy, cakes, cookies, frosting, pastries, colas, soft drinks, punches, fruit drinks, and regular gelatin.     Avoid alcohol. Alcohol, even more than sugar, may increase blood triglycerides. In addition, alcohol is high in calories and low in nutrients. Ask for sparkling water, or a diet soft drink instead of an alcoholic beverage.  Suggestions for planning and preparing meals   Bake, broil, grill or roast meats instead of frying.     Remove fat from meats and skin from poultry before cooking.     Add spices, herbs, lemon juice or vinegar to vegetables instead of salt, rich sauces or gravies.     Use a non-stick skillet without fat or use no-stick sprays.     Cool and refrigerate stews and broth. Then remove the hardened fat floating on the surface before serving.     Refrigerate meat drippings and skim off fat to make low-fat gravies.     Serve more fish.     Use less butter, margarine and other high-fat spreads on bread or vegetables.      Use skim or reconstituted non-fat dry milk for cooking.     Cook with low-fat cheeses.     Substitute low-fat yogurt or cottage cheese for all or part of the sour cream in recipes for sauces, dips or congealed salads.     Use half yogurt/half mayonnaise in salad recipes.     Substitute evaporated skim milk for cream. Evaporated skim milk or reconstituted non-fat dry milk can be whipped and substituted for whipped cream in certain recipes.     Choose fresh fruits for dessert instead of high-fat foods such as pies or cakes. Fruits are naturally low in fat.  When Dining Out   Order low-fat appetizers such as fruit or vegetable juice, pasta with vegetables or tomato sauce.     Select clear, rather than cream soups.     Ask that dressings and gravies be served on the side. Then use less of them.     Order foods that are baked, broiled, poached, steamed, stir-fried, or roasted.     Ask for margarine instead of butter, and use only a small amount.     Drink sparkling water, unsweetened tea or coffee, or diet soft drinks instead of alcohol or other sweet beverages.  QUESTIONS AND ANSWERS ABOUT OTHER FATS IN THE BLOOD: SATURATED FAT, TRANS FAT, AND CHOLESTEROL What is trans fat? Trans fat is a type of fat that is formed when vegetable oil is hardened through a process called hydrogenation. This process helps makes foods more solid, gives them shape, and prolongs their shelf life. Trans fats are also called hydrogenated or partially hydrogenated oils.   What do saturated fat, trans fat, and cholesterol in foods have to do with heart disease? Saturated fat, trans fat, and cholesterol in the diet all raise the level of LDL "bad" cholesterol in the blood. The higher the LDL cholesterol, the greater the risk for coronary heart disease (CHD). Saturated fat and trans fat raise  LDL similarly.   What foods contain saturated fat, trans fat, and cholesterol? High amounts of saturated fat are found in  animal products, such as fatty cuts of meat, chicken skin, and full-fat dairy products like butter, whole milk, cream, and cheese, and in tropical vegetable oils such as palm, palm kernel, and coconut oil. Trans fat is found in some of the same foods as saturated fat, such as vegetable shortening, some margarines (especially hard or stick margarine), crackers, cookies, baked goods, fried foods, salad dressings, and other processed foods made with partially hydrogenated vegetable oils. Small amounts of trans fat also occur naturally in some animal products, such as milk products, beef, and lamb. Foods high in cholesterol include liver, other organ meats, egg yolks, shrimp, and full-fat dairy products. How can I use the new food label to make heart-healthy food choices? Check the Nutrition Facts panel of the food label. Choose foods lower in saturated fat, trans fat, and cholesterol. For saturated fat and cholesterol, you can also use the Percent Daily Value (%DV): 5% DV or less is low, and 20% DV or more is high. (There is no %DV for trans fat.) Use the Nutrition Facts panel to choose foods low in saturated fat and cholesterol, and if the trans fat is not listed, read the ingredients and limit products that list shortening or hydrogenated or partially hydrogenated vegetable oil, which tend to be high in trans fat. POINTS TO REMEMBER: YOU NEED A LITTLE TLC (THERAPEUTIC LIFESTYLE CHANGES)  Discuss your risk for heart disease with your caregivers, and take steps to reduce risk factors.     Change your diet. Choose foods that are low in saturated fat, trans fat, and cholesterol.     Add exercise to your daily routine if it is not already being done. Participate in physical activity of moderate intensity, like brisk walking, for at least 30 minutes on most, and preferably all days of the week. No time? Break the 30 minutes into three, 10-minute segments during the day.     Stop smoking. If you do smoke, contact  your caregiver to discuss ways in which they can help you quit.     Do not use street drugs.     Maintain a normal weight.     Maintain a healthy blood pressure.     Keep up with your blood work for checking the fats in your blood as directed by your caregiver.  Document Released: 05/20/2004 Document Revised: 04/14/2011 Document Reviewed: 12/16/2008 [facility] Patient Information 2012 [place], [place].

## 2011-08-30 NOTE — Progress Notes (Signed)
Subjective:    Patient ID: Gregory Jensen, male    DOB: 1956/04/28, 56 y.o.   MRN: [id number]  HPI  56 year old patient is seen today for a preventive health examination. He has metabolic syndrome and continues to smoke. He has a strong family history of coronary artery disease with a father brother and sister status post CABG. His father and sister have also had aortic valvular stenosis. Laboratory studies were reviewed recently and  Do to  worsening hypertriglyceridemia, and a fibrate  was added to statin therapy.  He is sedentary with suboptimal dietary habits. He is followed closely by urology due to prostate cancer and did have a screening colonoscopy at age 72 or 51   No past medical history on file.  History   Social History  . Marital Status: Divorced    Spouse Name: N/A    Number of Children: N/A  . Years of Education: N/A   Occupational History  . [employer]    Social History Main Topics  . Smoking status: Current Everyday Smoker -- 1.5 packs/day for 40 years    Types: Cigarettes  . Smokeless tobacco: Never Used  . Alcohol Use: Yes  . Drug Use: No  . Sexually Active: Not on file   Other Topics Concern  . Not on file   Social History Narrative  . No narrative on file    No past surgical history on file.  Family History  Problem Relation Age of Onset  . Diabetes      family hx  . Hypertension Sister   . Heart disease Sister   . Heart disease Father   . Alzheimer's disease Mother   . Hypertension Sister     No Known Allergies  Current Outpatient Prescriptions on File Prior to Visit  Medication Sig Dispense Refill  . aspirin 81 MG EC tablet Take 81 mg by mouth daily.        . Cholecalciferol (VITAMIN D3) 1000 UNITS capsule Take 1,000 Units by mouth daily.        . Omega-3 Fatty Acids (FISH OIL) 1200 MG CAPS Take 1,200 mg by mouth daily. 2 caps once daily       . simvastatin (ZOCOR) 40 MG tablet Take 1 tablet (40 mg total) by mouth at bedtime.   30 tablet  11  . fenofibrate 160 MG tablet Take 1 tablet (160 mg total) by mouth daily.  90 tablet  3    BP 130/80  Pulse 78  Temp(Src) 98.4 F (36.9 C) (Oral)  Resp 18  Ht 5' 8.75" (1.746 m)  Wt 207 lb (93.895 kg)  BMI 30.79 kg/m2  SpO2 98%       Review of Systems  Constitutional: Negative for fever, chills, activity change, appetite change and fatigue.  HENT: Negative for hearing loss, ear pain, congestion, rhinorrhea, sneezing, mouth sores, trouble swallowing, neck pain, neck stiffness, dental problem, voice change, sinus pressure and tinnitus.   Eyes: Negative for photophobia, pain, redness and visual disturbance.  Respiratory: Negative for apnea, cough, choking, chest tightness, shortness of breath and wheezing.   Cardiovascular: Negative for chest pain, palpitations and leg swelling.  Gastrointestinal: Negative for nausea, vomiting, abdominal pain, diarrhea, constipation, blood in stool, abdominal distention, anal bleeding and rectal pain.  Genitourinary: Negative for dysuria, urgency, frequency, hematuria, flank pain, decreased urine volume, discharge, penile swelling, scrotal swelling, difficulty urinating, genital sores and testicular pain.  Musculoskeletal: Negative for myalgias, back pain, joint swelling, arthralgias and gait problem.  Skin:  Negative for color change, rash and wound.  Neurological: Negative for dizziness, tremors, seizures, syncope, facial asymmetry, speech difficulty, weakness, light-headedness, numbness (slight numbness of both feet) and headaches.  Hematological: Negative for adenopathy. Does not bruise/bleed easily.  Psychiatric/Behavioral: Negative for suicidal ideas, hallucinations, behavioral problems, confusion, sleep disturbance, self-injury, dysphoric mood, decreased concentration and agitation. The patient is not nervous/anxious.        Objective:   Physical Exam  Constitutional: He appears well-developed and well-nourished.  HENT:  Head:  Normocephalic and atraumatic.  Right Ear: External ear normal.  Left Ear: External ear normal.  Nose: Nose normal.  Mouth/Throat: Oropharynx is clear and moist.  Eyes: Conjunctivae and EOM are normal. Pupils are equal, round, and reactive to light. No scleral icterus.  Neck: Normal range of motion. Neck supple. No JVD present. No thyromegaly present.  Cardiovascular: Regular rhythm, normal heart sounds and intact distal pulses.  Exam reveals no gallop and no friction rub.   No murmur heard. Pulmonary/Chest: Effort normal and breath sounds normal. He exhibits no tenderness.  Abdominal: Soft. Bowel sounds are normal. He exhibits no distension and no mass. There is no tenderness.  Genitourinary: Penis normal.  Musculoskeletal: Normal range of motion. He exhibits no edema and no tenderness.  Lymphadenopathy:    He has no cervical adenopathy.  Neurological: He is alert. He has normal reflexes. No cranial nerve deficit. Coordination normal.  Skin: Skin is warm and dry. No rash noted.  Psychiatric: He has a normal mood and affect. His behavior is normal.          Assessment & Plan:    Metabolic syndrome with mild excess obesity dyslipidemia impaired glucose tolerance and low HDL cholesterol Ongoing tobacco use  family history coronary artery disease  Will add a fibrate as well as omega-3 fatty acids to his regimen. Weight loss and more rigorous exercise discussed at length. We'll check a lipid profile in 3 months. We'll consider a exercise stress test for risk stratification

## 2011-12-13 ENCOUNTER — Other Ambulatory Visit (INDEPENDENT_AMBULATORY_CARE_PROVIDER_SITE_OTHER): Payer: PRIVATE HEALTH INSURANCE

## 2011-12-13 DIAGNOSIS — E785 Hyperlipidemia, unspecified: Secondary | ICD-10-CM

## 2011-12-13 LAB — LIPID PANEL
Cholesterol: 128 mg/dL (ref 0–200)
HDL: 25.9 mg/dL — ABNORMAL LOW (ref >39.00)
Total CHOL/HDL Ratio: 5
Triglycerides: 403 mg/dL — ABNORMAL HIGH (ref 0.0–149.0)
VLDL: 80.6 mg/dL — ABNORMAL HIGH (ref 0.0–40.0)

## 2011-12-13 LAB — LDL CHOLESTEROL, DIRECT: Direct LDL: 43.7 mg/dL

## 2011-12-27 ENCOUNTER — Telehealth: Payer: Self-pay | Admitting: Internal Medicine

## 2011-12-27 NOTE — Telephone Encounter (Signed)
Please advise - this was lab only - was seen in Jan - CPX - repeat lipid

## 2011-12-27 NOTE — Telephone Encounter (Signed)
Pt would like blood work results °

## 2011-12-27 NOTE — Telephone Encounter (Signed)
t cholesterol 128 (down from 226) Triglycerides 403 (down from 1236) HDL 26  (improved from 15)  Nice response- continue same -encourage diet, weight loss, exercise  And d/c tobacco

## 2011-12-27 NOTE — Telephone Encounter (Signed)
spoke with pt- informed of lab results and dr. Vernon Prey instructions

## 2012-02-13 ENCOUNTER — Other Ambulatory Visit: Payer: Self-pay | Admitting: Internal Medicine

## 2012-08-31 ENCOUNTER — Other Ambulatory Visit: Payer: Self-pay | Admitting: Internal Medicine

## 2012-10-06 ENCOUNTER — Other Ambulatory Visit: Payer: Self-pay | Admitting: *Deleted

## 2012-10-06 MED ORDER — SIMVASTATIN 40 MG PO TABS: 40.0000 mg | ORAL_TABLET | Freq: Every day | ORAL | Status: DC

## 2012-12-06 ENCOUNTER — Other Ambulatory Visit: Payer: Self-pay | Admitting: Internal Medicine

## 2012-12-12 ENCOUNTER — Other Ambulatory Visit (INDEPENDENT_AMBULATORY_CARE_PROVIDER_SITE_OTHER): Payer: BC Managed Care – PPO

## 2012-12-12 DIAGNOSIS — Z0000 Encounter for general adult medical examination without abnormal findings: Secondary | ICD-10-CM

## 2012-12-12 LAB — HEPATIC FUNCTION PANEL
ALT: 36 U/L (ref 0–53)
AST: 29 U/L (ref 0–37)
Albumin: 4.4 g/dL (ref 3.5–5.2)
Alkaline Phosphatase: 73 U/L (ref 39–117)
Bilirubin, Direct: 0.1 mg/dL (ref 0.0–0.3)
Total Bilirubin: 0.6 mg/dL (ref 0.3–1.2)
Total Protein: 7.2 g/dL (ref 6.0–8.3)

## 2012-12-12 LAB — BASIC METABOLIC PANEL
BUN: 15 mg/dL (ref 6–23)
CO2: 27 mEq/L (ref 19–32)
Calcium: 9.9 mg/dL (ref 8.4–10.5)
Chloride: 103 mEq/L (ref 96–112)
Creatinine, Ser: 1 mg/dL (ref 0.4–1.5)
GFR: 82.59 mL/min (ref >60.00)
Glucose, Bld: 166 mg/dL — ABNORMAL HIGH (ref 70–99)
Potassium: 4.4 mEq/L (ref 3.5–5.1)
Sodium: 138 mEq/L (ref 135–145)

## 2012-12-12 LAB — POCT URINALYSIS DIPSTICK
Bilirubin, UA: NEGATIVE
Ketones, UA: NEGATIVE
Leukocytes, UA: NEGATIVE
Nitrite, UA: NEGATIVE
Spec Grav, UA: 1.025
Urobilinogen, UA: 0.2
pH, UA: 5

## 2012-12-12 LAB — CBC WITH DIFFERENTIAL/PLATELET
Basophils Absolute: 0 10*3/uL (ref 0.0–0.1)
Basophils Relative: 0.4 % (ref 0.0–3.0)
Eosinophils Absolute: 0.1 10*3/uL (ref 0.0–0.7)
Eosinophils Relative: 1.6 % (ref 0.0–5.0)
HCT: 42.7 % (ref 39.0–52.0)
Hemoglobin: 15.4 g/dL (ref 13.0–17.0)
Lymphocytes Relative: 24.7 % (ref 12.0–46.0)
Lymphs Abs: 2.2 10*3/uL (ref 0.7–4.0)
MCHC: 36 g/dL (ref 30.0–36.0)
MCV: 92.8 fl (ref 78.0–100.0)
Monocytes Absolute: 0.5 10*3/uL (ref 0.1–1.0)
Monocytes Relative: 5.5 % (ref 3.0–12.0)
Neutro Abs: 6.1 10*3/uL (ref 1.4–7.7)
Neutrophils Relative %: 67.8 % (ref 43.0–77.0)
Platelets: 168 10*3/uL (ref 150.0–400.0)
RBC: 4.6 Mil/uL (ref 4.22–5.81)
RDW: 12.8 % (ref 11.5–14.6)
WBC: 9 10*3/uL (ref 4.5–10.5)

## 2012-12-12 LAB — LIPID PANEL
Cholesterol: 137 mg/dL (ref 0–200)
HDL: 18.9 mg/dL — ABNORMAL LOW (ref >39.00)
Total CHOL/HDL Ratio: 7
Triglycerides: 653 mg/dL — ABNORMAL HIGH (ref 0.0–149.0)
VLDL: 130.6 mg/dL — ABNORMAL HIGH (ref 0.0–40.0)

## 2012-12-12 LAB — LDL CHOLESTEROL, DIRECT: Direct LDL: 33.3 mg/dL

## 2012-12-13 LAB — TSH: TSH: 2.52 u[IU]/mL (ref 0.35–5.50)

## 2012-12-13 LAB — PSA: PSA: 0 ng/mL — ABNORMAL LOW (ref 0.10–4.00)

## 2012-12-18 ENCOUNTER — Encounter: Payer: Self-pay | Admitting: Internal Medicine

## 2012-12-18 ENCOUNTER — Ambulatory Visit (INDEPENDENT_AMBULATORY_CARE_PROVIDER_SITE_OTHER): Payer: BC Managed Care – PPO | Admitting: Internal Medicine

## 2012-12-18 VITALS — BP 138/76 | HR 78 | Temp 98.1°F | Resp 18 | Ht 68.5 in | Wt 203.0 lb

## 2012-12-18 DIAGNOSIS — E8881 Metabolic syndrome: Secondary | ICD-10-CM

## 2012-12-18 DIAGNOSIS — R7309 Other abnormal glucose: Secondary | ICD-10-CM

## 2012-12-18 DIAGNOSIS — Z0000 Encounter for general adult medical examination without abnormal findings: Secondary | ICD-10-CM

## 2012-12-18 DIAGNOSIS — R7302 Impaired glucose tolerance (oral): Secondary | ICD-10-CM

## 2012-12-18 NOTE — Patient Instructions (Addendum)
Limit your sodium (Salt) intake    It is important that you exercise regularly, at least 20 minutes 3 to 4 times per week.  If you develop chest pain or shortness of breath seek  medical attention.  You need to lose weight.  Consider a lower calorie diet and regular exercise.  Smoking tobacco is very bad for your health. You should stop smoking immediately.  Return in one year for follow-up  Followup urology  Exercise stress test as discussed

## 2012-12-18 NOTE — Progress Notes (Signed)
Subjective:    Patient ID: Synetta Fail, male    DOB: August 20, 1955, 57 y.o.   MRN: [id number]  HPI Subjective:    Patient ID: Synetta Fail, male    DOB: Nov 18, 1955, 57 y.o.   MRN: [id number]  HPI 10 -year-old patient is seen today for a preventive health examination. He has metabolic syndrome and continues to smoke. He has a strong family history of coronary artery disease with a father brother and sister status post CABG. His father and sister have also had aortic valvular stenosis. Laboratory studies were reviewed recently and  Due  to  worsening hypertriglyceridemia,  a fibrate  was added to statin therapy.  He is sedentary with suboptimal dietary habits. He is followed closely by urology due to prostate cancer and did have a screening colonoscopy at age 16 or 70   No past medical history on file.  History   Social History  . Marital Status: Divorced    Spouse Name: N/A    Number of Children: N/A  . Years of Education: N/A   Occupational History  . [employer]    Social History Main Topics  . Smoking status: Current Everyday Smoker -- 1.5 packs/day for 40 years    Types: Cigarettes  . Smokeless tobacco: Never Used  . Alcohol Use: Yes  . Drug Use: No  . Sexually Active: Not on file   Other Topics Concern  . Not on file   Social History Narrative  . No narrative on file    No past surgical history on file.  Family History  Problem Relation Age of Onset  . Diabetes      family hx  . Hypertension Sister   . Heart disease Sister   . Heart disease Father   . Alzheimer's disease Mother   . Hypertension Sister     No Known Allergies  Current Outpatient Prescriptions on File Prior to Visit  Medication Sig Dispense Refill  . aspirin 81 MG EC tablet Take 81 mg by mouth daily.        . Cholecalciferol (VITAMIN D3) 1000 UNITS capsule Take 1,000 Units by mouth daily.        . Omega-3 Fatty Acids (FISH OIL) 1200 MG CAPS Take 1,200 mg by mouth daily. 2  caps once daily       . simvastatin (ZOCOR) 40 MG tablet Take 1 tablet (40 mg total) by mouth at bedtime.  30 tablet  11  . fenofibrate 160 MG tablet Take 1 tablet (160 mg total) by mouth daily.  90 tablet  3    BP 130/80  Pulse 78  Temp(Src) 98.4 F (36.9 C) (Oral)  Resp 18  Ht 5' 8.75" (1.746 m)  Wt 207 lb (93.895 kg)  BMI 30.79 kg/m2  SpO2 98%       Review of Systems  Constitutional: Negative for fever, chills, activity change, appetite change and fatigue.  HENT: Negative for hearing loss, ear pain, congestion, rhinorrhea, sneezing, mouth sores, trouble swallowing, neck pain, neck stiffness, dental problem, voice change, sinus pressure and tinnitus.   Eyes: Negative for photophobia, pain, redness and visual disturbance.  Respiratory: Negative for apnea, cough, choking, chest tightness, shortness of breath and wheezing.   Cardiovascular: Negative for chest pain, palpitations and leg swelling.  Gastrointestinal: Negative for nausea, vomiting, abdominal pain, diarrhea, constipation, blood in stool, abdominal distention, anal bleeding and rectal pain.  Genitourinary: Negative for dysuria, urgency, frequency, hematuria, flank pain, decreased urine volume, discharge, penile swelling,  scrotal swelling, difficulty urinating, genital sores and testicular pain.  Musculoskeletal: Negative for myalgias, back pain, joint swelling, arthralgias and gait problem.  Skin: Negative for color change, rash and wound.  Neurological: Negative for dizziness, tremors, seizures, syncope, facial asymmetry, speech difficulty, weakness, light-headedness, numbness (slight numbness of both feet) and headaches.  Hematological: Negative for adenopathy. Does not bruise/bleed easily.  Psychiatric/Behavioral: Negative for suicidal ideas, hallucinations, behavioral problems, confusion, sleep disturbance, self-injury, dysphoric mood, decreased concentration and agitation. The patient is not nervous/anxious.         Objective:   Physical Exam  Constitutional: He appears well-developed and well-nourished.  HENT:  Head: Normocephalic and atraumatic.  Right Ear: External ear normal.  Left Ear: External ear normal.  Nose: Nose normal.  Mouth/Throat: Oropharynx is clear and moist.  Eyes: Conjunctivae and EOM are normal. Pupils are equal, round, and reactive to light. No scleral icterus.  Neck: Normal range of motion. Neck supple. No JVD present. No thyromegaly present.  Cardiovascular: Regular rhythm, normal heart sounds and intact distal pulses.  Exam reveals no gallop and no friction rub.   No murmur heard. Pulmonary/Chest: Effort normal and breath sounds normal. He exhibits no tenderness.  Abdominal: Soft. Bowel sounds are normal. He exhibits no distension and no mass. There is no tenderness.  Genitourinary: Penis normal.  Musculoskeletal: Normal range of motion. He exhibits no edema and no tenderness.  Lymphadenopathy:    He has no cervical adenopathy.  Neurological: He is alert. He has normal reflexes. No cranial nerve deficit. Coordination normal.  Skin: Skin is warm and dry. No rash noted.  Psychiatric: He has a normal mood and affect. His behavior is normal.          Assessment & Plan:    Metabolic syndrome with mild excess obesity dyslipidemia impaired glucose tolerance and low HDL cholesterol Ongoing tobacco use Strong family history coronary artery disease  Will add a fibrate as well as omega-3 fatty acids to his regimen. Weight loss and more rigorous exercise discussed at length. We'll check a lipid profile in 3 months. We'll consider a exercise stress test for risk stratification  Wt Readings from Last 3 Encounters:  12/18/12 203 lb (92.08 kg)  08/30/11 207 lb (93.895 kg)  03/15/11 190 lb (86.183 kg)    Review of Systems    see above  Objective:   Physical Exam   As  Above      Assessment & Plan:    Preventive health examination Multiple cardiac risk factors  with dyslipidemia impaired glucose tolerance tobacco use and strong family history of coronary artery disease. Will discuss stress test for further risk stratification. Remains asymptomatic but quite sedentary  Modest weight loss regular exercise regimen encouraged Followup urology

## 2012-12-21 ENCOUNTER — Telehealth: Payer: Self-pay | Admitting: Internal Medicine

## 2012-12-21 NOTE — Telephone Encounter (Signed)
Gregory Jensen called from heart care, and stated that there was no referral in the systems for this patients exercise test. She stated that she has assigned the appointment on 01/16/13 at 10:30am to this patient. They needs to come fasting. Please assist.

## 2013-01-16 ENCOUNTER — Ambulatory Visit (INDEPENDENT_AMBULATORY_CARE_PROVIDER_SITE_OTHER): Payer: BC Managed Care – PPO | Admitting: Physician Assistant

## 2013-01-16 DIAGNOSIS — Z8249 Family history of ischemic heart disease and other diseases of the circulatory system: Secondary | ICD-10-CM

## 2013-01-16 DIAGNOSIS — E8881 Metabolic syndrome: Secondary | ICD-10-CM

## 2013-01-16 DIAGNOSIS — R0789 Other chest pain: Secondary | ICD-10-CM

## 2013-01-16 DIAGNOSIS — R7302 Impaired glucose tolerance (oral): Secondary | ICD-10-CM

## 2013-01-16 NOTE — Progress Notes (Signed)
Gregory Jensen is a 57 y.o. male who is referred by his PCP for an ETT.  He has a hx of HL, tobacco abuse and strong FHx of CAD.  Father and sister also had significant AS. He has occasional atypical chest pain with emotional stress.  No significant dyspnea or syncope.  Exam unremarkable.  ECG with NSR, NSSTTW changes.   Exercise Treadmill Test  Pre-Exercise Testing Evaluation Rhythm: normal sinus  Rate: 71     Test  Exercise Tolerance Test Ordering MD: Cassell Clement, MD  Interpreting MD: Tereso Newcomer, PA-C  Unique Test No: 1  Treadmill:  1  Indication for ETT: chest pain - rule out ischemia  Contraindication to ETT: No   Stress Modality: exercise - treadmill  Cardiac Imaging Performed: non   Protocol: standard Bruce - maximal  Max BP:  217/88  Max MPHR (bpm):  163 85% MPR (bpm):  139  MPHR obtained (bpm):  144 % MPHR obtained:  88  Reached 85% MPHR (min:sec):  6:19 Total Exercise Time (min-sec):  6:30  Workload in METS:  7.7 Borg Scale: 19  Reason ETT Terminated:  fatigue    ST Segment Analysis At Rest: normal ST segments - no evidence of significant ST depression With Exercise: non-specific ST changes  Other Information Arrhythmia:  No Angina during ETT:  absent (0) Quality of ETT:  diagnostic  ETT Interpretation:  normal - no evidence of ischemia by ST analysis  Comments: Fair exercise tolerance. No chest pain.  He did report shortness of breath.  Hypertensive BP response to exercise. Non-specific up-sloping ST depression. No changes to suggest ischemia.   Recommendations: F/u with PCP as directed. Signed,  Tereso Newcomer, PA-C   01/16/2013 11:13 AM

## 2013-02-23 ENCOUNTER — Other Ambulatory Visit: Payer: Self-pay | Admitting: Internal Medicine

## 2013-09-17 ENCOUNTER — Telehealth: Payer: Self-pay | Admitting: Internal Medicine

## 2013-09-17 NOTE — Telephone Encounter (Signed)
Patient Information:  Caller Name: Ashkan  Phone: [phone]  Patient: Gregory, Jensen  Gender: Male  DOB: Jun 21, 1956  Age: 58 Years  PCP: Bluford Kaufmann (Family Practice > 62yrs old)  Office Follow Up:  Does the office need to follow up with this patient?: No  Instructions For The Office: N/A  RN Note:  Pt has concerns about his blood pressure. BP was 145/90 on 09/15/13. Asympomatic today though he feels his pulse in his head at times.  Symptoms  Reason For Call & Symptoms: BP concerns  Reviewed Health History In EMR: Yes  Reviewed Medications In EMR: Yes  Reviewed Allergies In EMR: Yes  Reviewed Surgeries / Procedures: Yes  Date of Onset of Symptoms: 09/15/2013  Guideline(s) Used:  High Blood Pressure  Disposition Per Guideline:   See Within 2 Weeks in Office  Reason For Disposition Reached:   BP > 160/100  Advice Given:  BP less than 120 / 80   This is considered normal blood pressure  Lifestyle Changes  Maintain a healthy weight. Lose weight if you are overweight.  Do 30 minutes of aerobic physical activity (e.g., brisk walking) most days of the week.  Eat a diet high in fresh fruits and low-fat dairy products. Limit your intake of saturated and total fat. Choose foods that are lower in salt.  Call Back If:  Chest pain or difficulty breathing occurs  You become worse.  Patient Will Follow Care Advice:  YES  Appointment Scheduled:  09/19/2013 11:00:00 Appointment Scheduled Provider:  Bluford Kaufmann (Family Practice > 52yrs old)

## 2013-09-19 ENCOUNTER — Encounter: Payer: Self-pay | Admitting: Internal Medicine

## 2013-09-19 ENCOUNTER — Ambulatory Visit (INDEPENDENT_AMBULATORY_CARE_PROVIDER_SITE_OTHER): Payer: BC Managed Care – PPO | Admitting: Internal Medicine

## 2013-09-19 VITALS — BP 130/80 | HR 88 | Temp 97.7°F | Resp 20 | Ht 68.5 in | Wt 201.0 lb

## 2013-09-19 DIAGNOSIS — F17200 Nicotine dependence, unspecified, uncomplicated: Secondary | ICD-10-CM

## 2013-09-19 DIAGNOSIS — E785 Hyperlipidemia, unspecified: Secondary | ICD-10-CM

## 2013-09-19 NOTE — Progress Notes (Signed)
   Subjective:    Patient ID: Gregory Jensen, male    DOB: [birth year], 58 y.o.   MRN: [id number]  HPI BP Readings from Last 3 Encounters:  09/19/13 130/80  12/18/12 138/76  08/30/11 130/80     Review of Systems     Objective:   Physical Exam        Assessment & Plan:

## 2013-09-19 NOTE — Progress Notes (Signed)
Subjective:    Patient ID: Gregory Jensen, male    DOB: 16-Sep-1955, 58 y.o.   MRN: [id number]  HPI  58 year old patient who is seen today with blood pressure concerns. He has obtained a blood pressure monitor for home use and blood pressure readings are occasionally elevated he reports systolic readings as high as 151-761 and diastolics 60-73. Pulse rate is occasionally elevated in the 100 range. He describes some mild headaches.  BP Readings from Last 3 Encounters:  09/19/13 130/80  12/18/12 138/76  08/30/11 130/80    History reviewed. No pertinent past medical history.  History   Social History  . Marital Status: Divorced    Spouse Name: N/A    Number of Children: N/A  . Years of Education: N/A   Occupational History  . [employer]    Social History Main Topics  . Smoking status: Current Every Day Smoker -- 1.50 packs/day for 40 years    Types: Cigarettes  . Smokeless tobacco: Never Used  . Alcohol Use: Yes  . Drug Use: No  . Sexual Activity: Not on file   Other Topics Concern  . Not on file   Social History Narrative  . No narrative on file    History reviewed. No pertinent past surgical history.  Family History  Problem Relation Age of Onset  . Diabetes      family hx  . Hypertension Sister   . Heart disease Sister   . Heart disease Father   . Alzheimer's disease Mother   . Hypertension Sister     No Known Allergies  Current Outpatient Prescriptions on File Prior to Visit  Medication Sig Dispense Refill  . aspirin 81 MG EC tablet Take 81 mg by mouth daily.        . simvastatin (ZOCOR) 40 MG tablet TAKE 1 TABLET BY MOUTH AT BEDTIME  30 tablet  11   No current facility-administered medications on file prior to visit.    BP 130/80  Pulse 88  Temp(Src) 97.7 F (36.5 C) (Oral)  Resp 20  Ht 5' 8.5" (1.74 m)  Wt 201 lb (91.173 kg)  BMI 30.11 kg/m2  SpO2 97%     Review of Systems  Constitutional: Negative for fever, chills, appetite  change and fatigue.  HENT: Negative for congestion, dental problem, ear pain, hearing loss, sore throat, tinnitus, trouble swallowing and voice change.   Eyes: Negative for pain, discharge and visual disturbance.  Respiratory: Negative for cough, chest tightness, wheezing and stridor.   Cardiovascular: Negative for chest pain, palpitations and leg swelling.  Gastrointestinal: Negative for nausea, vomiting, abdominal pain, diarrhea, constipation, blood in stool and abdominal distention.  Genitourinary: Negative for urgency, hematuria, flank pain, discharge, difficulty urinating and genital sores.  Musculoskeletal: Negative for arthralgias, back pain, gait problem, joint swelling, myalgias and neck stiffness.  Skin: Negative for rash.  Neurological: Positive for headaches. Negative for dizziness, syncope, speech difficulty, weakness and numbness.  Hematological: Negative for adenopathy. Does not bruise/bleed easily.  Psychiatric/Behavioral: Negative for behavioral problems and dysphoric mood. The patient is not nervous/anxious.        Objective:   Physical Exam  Constitutional: He is oriented to person, place, and time. He appears well-developed.  Blood pressure 710 to 6:26 systolic over 94-85 diastolic  HENT:  Head: Normocephalic.  Right Ear: External ear normal.  Left Ear: External ear normal.  Eyes: Conjunctivae and EOM are normal.  Neck: Normal range of motion.  Cardiovascular: Normal rate and normal  heart sounds.   Pulmonary/Chest: Breath sounds normal.  Abdominal: Bowel sounds are normal.  Musculoskeletal: Normal range of motion. He exhibits no edema and no tenderness.  Neurological: He is alert and oriented to person, place, and time.  Psychiatric: He has a normal mood and affect. His behavior is normal.          Assessment & Plan:    History labile hypertension          History of labile hypertension History of exaggerated blood pressure response to exercise on  Bruce protocol ETT  Options discussed. We'll try a nonpharmacologic approach. Smoking cessation encouraged as well as low-salt diet. Exercise modest weight loss all recommended. Information concerning a DASH diet dispensed

## 2013-09-19 NOTE — Patient Instructions (Signed)
Limit your sodium (Salt) intake  Smoking tobacco is very bad for your health. You should stop smoking immediately.  Please check your blood pressure on a regular basis.  If it is consistently greater than 150/90, please make an office appointment.  DASH Diet The DASH diet stands for "Dietary Approaches to Stop Hypertension." It is a healthy eating plan that has been shown to reduce high blood pressure (hypertension) in as little as 14 days, while also possibly providing other significant health benefits. These other health benefits include reducing the risk of breast cancer after menopause and reducing the risk of type 2 diabetes, heart disease, colon cancer, and stroke. Health benefits also include weight loss and slowing kidney failure in patients with chronic kidney disease.  DIET GUIDELINES  Limit salt (sodium). Your diet should contain less than 1500 mg of sodium daily.  Limit refined or processed carbohydrates. Your diet should include mostly whole grains. Desserts and added sugars should be used sparingly.  Include small amounts of heart-healthy fats. These types of fats include nuts, oils, and tub margarine. Limit saturated and trans fats. These fats have been shown to be harmful in the body. CHOOSING FOODS  The following food groups are based on a 2000 calorie diet. See your Registered Dietitian for individual calorie needs. Grains and Grain Products (6 to 8 servings daily)  Eat More Often: Whole-wheat bread, brown rice, whole-grain or wheat pasta, quinoa, popcorn without added fat or salt (air popped).  Eat Less Often: White bread, white pasta, white rice, cornbread. Vegetables (4 to 5 servings daily)  Eat More Often: Fresh, frozen, and canned vegetables. Vegetables may be raw, steamed, roasted, or grilled with a minimal amount of fat.  Eat Less Often/Avoid: Creamed or fried vegetables. Vegetables in a cheese sauce. Fruit (4 to 5 servings daily)  Eat More Often: All fresh,  canned (in natural juice), or frozen fruits. Dried fruits without added sugar. One hundred percent fruit juice ( cup [237 mL] daily).  Eat Less Often: Dried fruits with added sugar. Canned fruit in light or heavy syrup. [employer], Fish, and Poultry (2 servings or less daily. One serving is 3 to 4 oz [85-114 g]).  Eat More Often: Ninety percent or leaner ground beef, tenderloin, sirloin. Round cuts of beef, chicken breast, [place] breast. All fish. Grill, bake, or broil your meat. Nothing should be fried.  Eat Less Often/Avoid: Fatty cuts of meat, [place], or chicken leg, thigh, or wing. Fried cuts of meat or fish. Dairy (2 to 3 servings)  Eat More Often: Low-fat or fat-free milk, low-fat plain or light yogurt, reduced-fat or part-skim cheese.  Eat Less Often/Avoid: Milk (whole, 2%).Whole milk yogurt. Full-fat cheeses. Nuts, Seeds, and Legumes (4 to 5 servings per week)  Eat More Often: All without added salt.  Eat Less Often/Avoid: Salted nuts and seeds, canned beans with added salt. Fats and Sweets (limited)  Eat More Often: Vegetable oils, tub margarines without trans fats, sugar-free gelatin. Mayonnaise and salad dressings.  Eat Less Often/Avoid: Coconut oils, palm oils, butter, stick margarine, cream, half and half, cookies, candy, pie. FOR MORE INFORMATION The Dash Diet Eating Plan: www.dashdiet.org Document Released: 07/22/2011 Document Revised: 10/25/2011 Document Reviewed: 07/22/2011 [facility] Patient Information 2014 [place], [place].

## 2013-09-19 NOTE — Progress Notes (Signed)
Pre-visit discussion using our clinic review tool. No additional management support is needed unless otherwise documented below in the visit note.  

## 2013-09-21 ENCOUNTER — Telehealth: Payer: Self-pay | Admitting: Internal Medicine

## 2013-09-21 NOTE — Telephone Encounter (Signed)
Relevant patient education assigned to patient using Emmi. ° °

## 2013-12-13 ENCOUNTER — Other Ambulatory Visit (INDEPENDENT_AMBULATORY_CARE_PROVIDER_SITE_OTHER): Payer: BC Managed Care – PPO

## 2013-12-13 ENCOUNTER — Ambulatory Visit: Payer: BC Managed Care – PPO

## 2013-12-13 DIAGNOSIS — Z0000 Encounter for general adult medical examination without abnormal findings: Secondary | ICD-10-CM

## 2013-12-13 LAB — HEPATIC FUNCTION PANEL
ALT: 25 U/L (ref 0–53)
AST: 19 U/L (ref 0–37)
Albumin: 4.3 g/dL (ref 3.5–5.2)
Alkaline Phosphatase: 98 U/L (ref 39–117)
Bilirubin, Direct: 0.1 mg/dL (ref 0.0–0.3)
Indirect Bilirubin: 0.4 mg/dL (ref 0.2–1.2)
Total Bilirubin: 0.5 mg/dL (ref 0.2–1.2)
Total Protein: 6.8 g/dL (ref 6.0–8.3)

## 2013-12-13 LAB — CBC WITH DIFFERENTIAL/PLATELET
Basophils Absolute: 0 10*3/uL (ref 0.0–0.1)
Basophils Relative: 0 % (ref 0–1)
Eosinophils Absolute: 0.2 10*3/uL (ref 0.0–0.7)
Eosinophils Relative: 2 % (ref 0–5)
HCT: 42 % (ref 39.0–52.0)
Hemoglobin: 15 g/dL (ref 13.0–17.0)
Lymphocytes Relative: 19 % (ref 12–46)
Lymphs Abs: 2 10*3/uL (ref 0.7–4.0)
MCH: 32.5 pg (ref 26.0–34.0)
MCHC: 35.7 g/dL (ref 30.0–36.0)
MCV: 91.1 fL (ref 78.0–100.0)
Monocytes Absolute: 0.6 10*3/uL (ref 0.1–1.0)
Monocytes Relative: 6 % (ref 3–12)
Neutro Abs: 7.5 10*3/uL (ref 1.7–7.7)
Neutrophils Relative %: 73 % (ref 43–77)
Platelets: 148 10*3/uL — ABNORMAL LOW (ref 150–400)
RBC: 4.61 MIL/uL (ref 4.22–5.81)
RDW: 13.3 % (ref 11.5–15.5)
WBC: 10.3 10*3/uL (ref 4.0–10.5)

## 2013-12-13 LAB — POCT URINALYSIS DIPSTICK
Bilirubin, UA: NEGATIVE
Leukocytes, UA: NEGATIVE
Nitrite, UA: NEGATIVE
Spec Grav, UA: 1.025
Urobilinogen, UA: 0.2
pH, UA: 5

## 2013-12-13 LAB — BASIC METABOLIC PANEL
BUN: 11 mg/dL (ref 6–23)
CO2: 25 mEq/L (ref 19–32)
Calcium: 8.7 mg/dL (ref 8.4–10.5)
Chloride: 100 mEq/L (ref 96–112)
Creat: 0.92 mg/dL (ref 0.50–1.35)
Glucose, Bld: 204 mg/dL — ABNORMAL HIGH (ref 70–99)
Potassium: 4 mEq/L (ref 3.5–5.3)
Sodium: 132 mEq/L — ABNORMAL LOW (ref 135–145)

## 2013-12-13 LAB — LIPID PANEL
Cholesterol: 227 mg/dL — ABNORMAL HIGH (ref 0–200)
HDL: 13 mg/dL — ABNORMAL LOW (ref >39)
Total CHOL/HDL Ratio: 17.5 Ratio
Triglycerides: 1909 mg/dL — ABNORMAL HIGH (ref <150)

## 2013-12-14 LAB — TSH: TSH: 3.217 u[IU]/mL (ref 0.350–4.500)

## 2013-12-14 LAB — PSA: PSA: <0.01 ng/mL (ref <=4.00)

## 2013-12-14 NOTE — Progress Notes (Signed)
Pt scheduled for Physical on 5/6 will discuss labs at visit per Dr. Raliegh Ip.

## 2013-12-19 ENCOUNTER — Encounter: Payer: Self-pay | Admitting: Internal Medicine

## 2013-12-19 ENCOUNTER — Ambulatory Visit (INDEPENDENT_AMBULATORY_CARE_PROVIDER_SITE_OTHER): Payer: BC Managed Care – PPO | Admitting: Internal Medicine

## 2013-12-19 ENCOUNTER — Other Ambulatory Visit: Payer: Self-pay | Admitting: *Deleted

## 2013-12-19 VITALS — BP 132/74 | HR 84 | Temp 98.1°F | Resp 20 | Ht 68.5 in | Wt 199.0 lb

## 2013-12-19 DIAGNOSIS — F17200 Nicotine dependence, unspecified, uncomplicated: Secondary | ICD-10-CM

## 2013-12-19 DIAGNOSIS — Z23 Encounter for immunization: Secondary | ICD-10-CM

## 2013-12-19 DIAGNOSIS — E119 Type 2 diabetes mellitus without complications: Secondary | ICD-10-CM

## 2013-12-19 DIAGNOSIS — Z0000 Encounter for general adult medical examination without abnormal findings: Secondary | ICD-10-CM

## 2013-12-19 DIAGNOSIS — R739 Hyperglycemia, unspecified: Secondary | ICD-10-CM

## 2013-12-19 DIAGNOSIS — C61 Malignant neoplasm of prostate: Secondary | ICD-10-CM

## 2013-12-19 MED ORDER — ATORVASTATIN CALCIUM 40 MG PO TABS: 40.0000 mg | ORAL_TABLET | Freq: Every day | ORAL | Status: DC

## 2013-12-19 MED ORDER — FENOFIBRATE 150 MG PO CAPS
150.0000 mg | ORAL_CAPSULE | Freq: Every day | ORAL | Status: DC
Start: 2013-12-19 — End: 2013-12-21

## 2013-12-19 MED ORDER — ONETOUCH DELICA LANCETS 33G MISC: 1.0000 | Freq: Every day | Status: DC | PRN

## 2013-12-19 MED ORDER — GLUCOSE BLOOD VI STRP: 1.0000 | ORAL_STRIP | Freq: Every day | Status: DC | PRN

## 2013-12-19 MED ORDER — SIMVASTATIN 40 MG PO TABS: ORAL_TABLET | ORAL | Status: DC

## 2013-12-19 MED ORDER — METFORMIN HCL 500 MG PO TABS: 500.0000 mg | ORAL_TABLET | Freq: Two times a day (BID) | ORAL | Status: DC

## 2013-12-19 NOTE — Patient Instructions (Addendum)
Smoking tobacco is very bad for your health. You should stop smoking immediately.    It is important that you exercise regularly, at least 20 minutes 3 to 4 times per week.  If you develop chest pain or shortness of breath seek  medical attention.  Resume daily, fish oil supplements  Hypertriglyceridemia  Diet for High blood levels of Triglycerides Most fats in food are triglycerides. Triglycerides in your blood are stored as fat in your body. High levels of triglycerides in your blood may put you at a greater risk for heart disease and stroke.  Normal triglyceride levels are less than 150 mg/dL. Borderline high levels are 150-199 mg/dl. High levels are 200 - 499 mg/dL, and very high triglyceride levels are greater than 500 mg/dL. The decision to treat high triglycerides is generally based on the level. For people with borderline or high triglyceride levels, treatment includes weight loss and exercise. Drugs are recommended for people with very high triglyceride levels. Many people who need treatment for high triglyceride levels have metabolic syndrome. This syndrome is a collection of disorders that often include: insulin resistance, high blood pressure, blood clotting problems, high cholesterol and triglycerides. TESTING PROCEDURE FOR TRIGLYCERIDES  You should not eat 4 hours before getting your triglycerides measured. The normal range of triglycerides is between 10 and 250 milligrams per deciliter (mg/dl). Some people may have extreme levels (1000 or above), but your triglyceride level may be too high if it is above 150 mg/dl, depending on what other risk factors you have for heart disease.  People with high blood triglycerides may also have high blood cholesterol levels. If you have high blood cholesterol as well as high blood triglycerides, your risk for heart disease is probably greater than if you only had high triglycerides. High blood cholesterol is one of the main risk factors for heart  disease. CHANGING YOUR DIET  Your weight can affect your blood triglyceride level. If you are more than 20% above your ideal body weight, you may be able to lower your blood triglycerides by losing weight. Eating less and exercising regularly is the best way to combat this. Fat provides more calories than any other food. The best way to lose weight is to eat less fat. Only 30% of your total calories should come from fat. Less than 7% of your diet should come from saturated fat. A diet low in fat and saturated fat is the same as a diet to decrease blood cholesterol. By eating a diet lower in fat, you may lose weight, lower your blood cholesterol, and lower your blood triglyceride level.  Eating a diet low in fat, especially saturated fat, may also help you lower your blood triglyceride level. Ask your dietitian to help you figure how much fat you can eat based on the number of calories your caregiver has prescribed for you.  Exercise, in addition to helping with weight loss may also help lower triglyceride levels.   Alcohol can increase blood triglycerides. You may need to stop drinking alcoholic beverages.  Too much carbohydrate in your diet may also increase your blood triglycerides. Some complex carbohydrates are necessary in your diet. These may include bread, rice, potatoes, other starchy vegetables and cereals.  Reduce "simple" carbohydrates. These may include pure sugars, candy, honey, and jelly without losing other nutrients. If you have the kind of high blood triglycerides that is affected by the amount of carbohydrates in your diet, you will need to eat less sugar and less high-sugar foods. Your  caregiver can help you with this.  Adding 2-4 grams of fish oil (EPA+ DHA) may also help lower triglycerides. Speak with your caregiver before adding any supplements to your regimen. Following the Diet  Maintain your ideal weight. Your caregivers can help you with a diet. Generally, eating less food  and getting more exercise will help you lose weight. Joining a weight control group may also help. Ask your caregivers for a good weight control group in your area.  Eat low-fat foods instead of high-fat foods. This can help you lose weight too.  These foods are lower in fat. Eat MORE of these:   Dried beans, peas, and lentils.  Egg whites.  Low-fat cottage cheese.  Fish.  Lean cuts of meat, such as round, sirloin, rump, and flank (cut extra fat off meat you fix).  Whole grain breads, cereals and pasta.  Skim and nonfat dry milk.  Low-fat yogurt.  Poultry without the skin.  Cheese made with skim or part-skim milk, such as mozzarella, parmesan, farmers', ricotta, or pot cheese. These are higher fat foods. Eat LESS of these:   Whole milk and foods made from whole milk, such as American, blue, cheddar, monterey jack, and swiss cheese  High-fat meats, such as luncheon meats, sausages, knockwurst, bratwurst, hot dogs, ribs, corned beef, ground pork, and regular ground beef.  Fried foods. Limit saturated fats in your diet. Substituting unsaturated fat for saturated fat may decrease your blood triglyceride level. You will need to read package labels to know which products contain saturated fats.  These foods are high in saturated fat. Eat LESS of these:   Fried pork skins.  Whole milk.  Skin and fat from poultry.  Palm oil.  Butter.  Shortening.  Cream cheese.  Berniece Salines.  Margarines and baked goods made from listed oils.  Vegetable shortenings.  Chitterlings.  Fat from meats.  Coconut oil.  Palm kernel oil.  Lard.  Cream.  Sour cream.  Fatback.  Coffee whiteners and non-dairy creamers made with these oils.  Cheese made from whole milk. Use unsaturated fats (both polyunsaturated and monounsaturated) moderately. Remember, even though unsaturated fats are better than saturated fats; you still want a diet low in total fat.  These foods are high in  unsaturated fat:   Canola oil.  Sunflower oil.  Mayonnaise.  Almonds.  Peanuts.  Pine nuts.  Margarines made with these oils.  Safflower oil.  Olive oil.  Avocados.  Cashews.  Peanut butter.  Sunflower seeds.  Soybean oil.  Peanut oil.  Olives.  Pecans.  Walnuts.  Pumpkin seeds. Avoid sugar and other high-sugar foods. This will decrease carbohydrates without decreasing other nutrients. Sugar in your food goes rapidly to your blood. When there is excess sugar in your blood, your liver may use it to make more triglycerides. Sugar also contains calories without other important nutrients.  Eat LESS of these:   Sugar, brown sugar, powdered sugar, jam, jelly, preserves, honey, syrup, molasses, pies, candy, cakes, cookies, frosting, pastries, colas, soft drinks, punches, fruit drinks, and regular gelatin.  Avoid alcohol. Alcohol, even more than sugar, may increase blood triglycerides. In addition, alcohol is high in calories and low in nutrients. Ask for sparkling water, or a diet soft drink instead of an alcoholic beverage. Suggestions for planning and preparing meals   Bake, broil, grill or roast meats instead of frying.  Remove fat from meats and skin from poultry before cooking.  Add spices, herbs, lemon juice or vinegar to vegetables instead of  salt, rich sauces or gravies.  Use a non-stick skillet without fat or use no-stick sprays.  Cool and refrigerate stews and broth. Then remove the hardened fat floating on the surface before serving.  Refrigerate meat drippings and skim off fat to make low-fat gravies.  Serve more fish.  Use less butter, margarine and other high-fat spreads on bread or vegetables.  Use skim or reconstituted non-fat dry milk for cooking.  Cook with low-fat cheeses.  Substitute low-fat yogurt or cottage cheese for all or part of the sour cream in recipes for sauces, dips or congealed salads.  Use half yogurt/half mayonnaise in  salad recipes.  Substitute evaporated skim milk for cream. Evaporated skim milk or reconstituted non-fat dry milk can be whipped and substituted for whipped cream in certain recipes.  Choose fresh fruits for dessert instead of high-fat foods such as pies or cakes. Fruits are naturally low in fat. When Dining Out   Order low-fat appetizers such as fruit or vegetable juice, pasta with vegetables or tomato sauce.  Select clear, rather than cream soups.  Ask that dressings and gravies be served on the side. Then use less of them.  Order foods that are baked, broiled, poached, steamed, stir-fried, or roasted.  Ask for margarine instead of butter, and use only a small amount.  Drink sparkling water, unsweetened tea or coffee, or diet soft drinks instead of alcohol or other sweet beverages. QUESTIONS AND ANSWERS ABOUT OTHER FATS IN THE BLOOD: SATURATED FAT, TRANS FAT, AND CHOLESTEROL What is trans fat? Trans fat is a type of fat that is formed when vegetable oil is hardened through a process called hydrogenation. This process helps makes foods more solid, gives them shape, and prolongs their shelf life. Trans fats are also called hydrogenated or partially hydrogenated oils.  What do saturated fat, trans fat, and cholesterol in foods have to do with heart disease? Saturated fat, trans fat, and cholesterol in the diet all raise the level of LDL "bad" cholesterol in the blood. The higher the LDL cholesterol, the greater the risk for coronary heart disease (CHD). Saturated fat and trans fat raise LDL similarly.  What foods contain saturated fat, trans fat, and cholesterol? High amounts of saturated fat are found in animal products, such as fatty cuts of meat, chicken skin, and full-fat dairy products like butter, whole milk, cream, and cheese, and in tropical vegetable oils such as palm, palm kernel, and coconut oil. Trans fat is found in some of the same foods as saturated fat, such as vegetable  shortening, some margarines (especially hard or stick margarine), crackers, cookies, baked goods, fried foods, salad dressings, and other processed foods made with partially hydrogenated vegetable oils. Small amounts of trans fat also occur naturally in some animal products, such as milk products, beef, and lamb. Foods high in cholesterol include liver, other organ meats, egg yolks, shrimp, and full-fat dairy products. How can I use the new food label to make heart-healthy food choices? Check the Nutrition Facts panel of the food label. Choose foods lower in saturated fat, trans fat, and cholesterol. For saturated fat and cholesterol, you can also use the Percent Daily Value (%DV): 5% DV or less is low, and 20% DV or more is high. (There is no %DV for trans fat.) Use the Nutrition Facts panel to choose foods low in saturated fat and cholesterol, and if the trans fat is not listed, read the ingredients and limit products that list shortening or hydrogenated or partially hydrogenated vegetable  oil, which tend to be high in trans fat. POINTS TO REMEMBER:   Discuss your risk for heart disease with your caregivers, and take steps to reduce risk factors.  Change your diet. Choose foods that are low in saturated fat, trans fat, and cholesterol.  Add exercise to your daily routine if it is not already being done. Participate in physical activity of moderate intensity, like brisk walking, for at least 30 minutes on most, and preferably all days of the week. No time? Break the 30 minutes into three, 10-minute segments during the day.  Stop smoking. If you do smoke, contact your caregiver to discuss ways in which they can help you quit.  Do not use street drugs.  Maintain a normal weight.  Maintain a healthy blood pressure.  Keep up with your blood work for checking the fats in your blood as directed by your caregiver. Document Released: 05/20/2004 Document Revised: 02/01/2012 Document Reviewed:  12/16/2008 [facility] Patient Information 2014 [place]. Diabetes and Exercise Exercising regularly is important. It is not just about losing weight. It has many health benefits, such as:  Improving your overall fitness, flexibility, and endurance.  Increasing your bone density.  Helping with weight control.  Decreasing your body fat.  Increasing your muscle strength.  Reducing stress and tension.  Improving your overall health. People with diabetes who exercise gain additional benefits because exercise:  Reduces appetite.  Improves the body's use of blood sugar (glucose).  Helps lower or control blood glucose.  Decreases blood pressure.  Helps control blood lipids (such as cholesterol and triglycerides).  Improves the body's use of the hormone insulin by:  Increasing the body's insulin sensitivity.  Reducing the body's insulin needs.  Decreases the risk for heart disease because exercising:  Lowers cholesterol and triglycerides levels.  Increases the levels of good cholesterol (such as high-density lipoproteins [HDL]) in the body.  Lowers blood glucose levels. YOUR ACTIVITY PLAN  Choose an activity that you enjoy and set realistic goals. Your health care provider or diabetes educator can help you make an activity plan that works for you. You can break activities into 2 or 3 sessions throughout the day. Doing so is as good as one long session. Exercise ideas include:  Taking the dog for a walk.  Taking the stairs instead of the elevator.  Dancing to your favorite song.  Doing your favorite exercise with a friend. RECOMMENDATIONS FOR EXERCISING WITH TYPE 1 OR TYPE 2 DIABETES   Check your blood glucose before exercising. If blood glucose levels are greater than 240 mg/dL, check for urine ketones. Do not exercise if ketones are present.  Avoid injecting insulin into areas of the body that are going to be exercised. For example, avoid injecting insulin  into:  The arms when playing tennis.  The legs when jogging.  Keep a record of:  Food intake before and after you exercise.  Expected peak times of insulin action.  Blood glucose levels before and after you exercise.  The type and amount of exercise you have done.  Review your records with your health care provider. Your health care provider will help you to develop guidelines for adjusting food intake and insulin amounts before and after exercising.  If you take insulin or oral hypoglycemic agents, watch for signs and symptoms of hypoglycemia. They include:  Dizziness.  Shaking.  Sweating.  Chills.  Confusion.  Drink plenty of water while you exercise to prevent dehydration or heat stroke. Body water is lost during  exercise and must be replaced.  Talk to your health care provider before starting an exercise program to make sure it is safe for you. Remember, almost any type of activity is better than none. Document Released: 10/23/2003 Document Revised: 04/04/2013 Document Reviewed: 01/09/2013 [facility] Patient Information 2014 [place]. Diabetes and Standards of Medical Care  Diabetes is complicated. You may find that your diabetes team includes a dietitian, nurse, diabetes educator, eye doctor, and more. To help everyone know what is going on and to help you get the care you deserve, the following schedule of care was developed to help keep you on track. Below are the tests, exams, vaccines, medicines, education, and plans you will need. HbA1c test This test shows how well you have controlled your glucose over the past 2 3 months. It is used to see if your diabetes management plan needs to be adjusted.   It is performed at least 2 times a year if you are meeting treatment goals.  It is performed 4 times a year if therapy has changed or if you are not meeting treatment goals. Blood pressure test  This test is performed at every routine medical visit. The goal is  less than 140/90 mmHg for most people, but 130/80 mmHg in some cases. Ask your health care provider about your goal. Dental exam  Follow up with the dentist regularly. Eye exam  If you are diagnosed with type 1 diabetes as a child, get an exam upon reaching the age of 56 years or older and have had diabetes for 3 5 years. Yearly eye exams are recommended after that initial eye exam.  If you are diagnosed with type 1 diabetes as an adult, get an exam within 5 years of diagnosis and then yearly.  If you are diagnosed with type 2 diabetes, get an exam as soon as possible after the diagnosis and then yearly. Foot care exam  Visual foot exams are performed at every routine medical visit. The exams check for cuts, injuries, or other problems with the feet.  A comprehensive foot exam should be done yearly. This includes visual inspection as well as assessing foot pulses and testing for loss of sensation.  Check your feet nightly for cuts, injuries, or other problems with your feet. Tell your health care provider if anything is not healing. Kidney function test (urine microalbumin)  This test is performed once a year.  Type 1 diabetes: The first test is performed 5 years after diagnosis.  Type 2 diabetes: The first test is performed at the time of diagnosis.  A serum creatinine and estimated glomerular filtration rate (eGFR) test is done once a year to assess the level of chronic kidney disease (CKD), if present. Lipid profile (cholesterol, HDL, LDL, triglycerides)  Performed every 5 years for most people.  The goal for LDL is less than 100 mg/dL. If you are at high risk, the goal is less than 70 mg/dL.  The goal for HDL is 40 mg/dL 50 mg/dL for men and 50 mg/dL 60 mg/dL for women. An HDL cholesterol of 60 mg/dL or higher gives some protection against heart disease.  The goal for triglycerides is less than 150 mg/dL. Influenza vaccine, pneumococcal vaccine, and hepatitis B vaccine  The  influenza vaccine is recommended yearly.  The pneumococcal vaccine is generally given once in a lifetime. However, there are some instances when another vaccination is recommended. Check with your health care provider.  The hepatitis B vaccine is also recommended for adults  with diabetes. Diabetes self-management education  Education is recommended at diagnosis and ongoing as needed. Treatment plan  Your treatment plan is reviewed at every medical visit. Document Released: 05/30/2009 Document Revised: 04/04/2013 Document Reviewed: 01/02/2013 [facility] Patient Information 2014 [place]. Diabetes Meal Planning Guide The diabetes meal planning guide is a tool to help you plan your meals and snacks. It is important for people with diabetes to manage their blood glucose (sugar) levels. Choosing the right foods and the right amounts throughout your day will help control your blood glucose. Eating right can even help you improve your blood pressure and reach or maintain a healthy weight. CARBOHYDRATE COUNTING MADE EASY When you eat carbohydrates, they turn to sugar. This raises your blood glucose level. Counting carbohydrates can help you control this level so you feel better. When you plan your meals by counting carbohydrates, you can have more flexibility in what you eat and balance your medicine with your food intake. Carbohydrate counting simply means adding up the total amount of carbohydrate grams in your meals and snacks. Try to eat about the same amount at each meal. Foods with carbohydrates are listed below. Each portion below is 1 carbohydrate serving or 15 grams of carbohydrates. Ask your dietician how many grams of carbohydrates you should eat at each meal or snack. Grains and Starches  1 slice bread.   English muffin or hotdog/hamburger bun.   cup cold cereal (unsweetened).   cup cooked pasta or rice.   cup starchy vegetables (corn, potatoes, peas, beans, winter  squash).  1 tortilla (6 inches).   bagel.  1 waffle or pancake (size of a CD).   cup cooked cereal.  4 to 6 small crackers. *Whole grain is recommended. Fruit  1 cup fresh unsweetened berries, melon, papaya, pineapple.  1 small fresh fruit.   banana or mango.   cup fruit juice (4 oz unsweetened).   cup canned fruit in natural juice or water.  2 tbs dried fruit.  12 to 15 grapes or cherries. Milk and Yogurt  1 cup fat-free or 1% milk.  1 cup soy milk.  6 oz light yogurt with sugar-free sweetener.  6 oz low-fat soy yogurt.  6 oz plain yogurt. Vegetables  1 cup raw or  cup cooked is counted as 0 carbohydrates or a "free" food.  If you eat 3 or more servings at 1 meal, count them as 1 carbohydrate serving. Other Carbohydrates   oz chips or pretzels.   cup ice cream or frozen yogurt.   cup sherbet or sorbet.  2 inch square cake, no frosting.  1 tbs honey, sugar, jam, jelly, or syrup.  2 small cookies.  3 squares of graham crackers.  3 cups popcorn.  6 crackers.  1 cup broth-based soup.  Count 1 cup casserole or other mixed foods as 2 carbohydrate servings.  Foods with less than 20 calories in a serving may be counted as 0 carbohydrates or a "free" food. You may want to purchase a book or computer software that lists the carbohydrate gram counts of different foods. In addition, the nutrition facts panel on the labels of the foods you eat are a good source of this information. The label will tell you how big the serving size is and the total number of carbohydrate grams you will be eating per serving. Divide this number by 15 to obtain the number of carbohydrate servings in a portion. Remember, 1 carbohydrate serving equals 15 grams of carbohydrate. SERVING SIZES Measuring  foods and serving sizes helps you make sure you are getting the right amount of food. The list below tells how big or small some common serving sizes are.  1 oz.........4  stacked dice.  3 oz........Marland KitchenDeck of cards.  1 tsp.......Marland KitchenTip of little finger.  1 tbs......Marland KitchenMarland KitchenThumb.  2 tbs.......Marland KitchenGolf ball.   cup......Marland KitchenHalf of a fist.  1 cup.......Marland KitchenA fist. SAMPLE DIABETES MEAL PLAN Below is a sample meal plan that includes foods from the grain and starches, dairy, vegetable, fruit, and meat groups. A dietician can individualize a meal plan to fit your calorie needs and tell you the number of servings needed from each food group. However, controlling the total amount of carbohydrates in your meal or snack is more important than making sure you include all of the food groups at every meal. You may interchange carbohydrate containing foods (dairy, starches, and fruits). The meal plan below is an example of a 2000 calorie diet using carbohydrate counting. This meal plan has 17 carbohydrate servings. Breakfast  1 cup oatmeal (2 carb servings).   cup light yogurt (1 carb serving).  1 cup blueberries (1 carb serving).   cup almonds. Snack  1 large apple (2 carb servings).  1 low-fat string cheese stick. Lunch  Chicken breast salad.  1 cup spinach.   cup chopped tomatoes.  2 oz chicken breast, sliced.  2 tbs low-fat [place] dressing.  12 whole-wheat crackers (2 carb servings).  12 to 15 grapes (1 carb serving).  1 cup low-fat milk (1 carb serving). Snack  1 cup carrots.   cup hummus (1 carb serving). Dinner  3 oz broiled salmon.  1 cup brown rice (3 carb servings). Snack  1  cups steamed broccoli (1 carb serving) drizzled with 1 tsp olive oil and lemon juice.  1 cup light pudding (2 carb servings). DIABETES MEAL PLANNING WORKSHEET Your dietician can use this worksheet to help you decide how many servings of foods and what types of foods are right for you.  BREAKFAST Food Group and Servings / Carb Servings Grain/Starches __________________________________ Dairy __________________________________________ Vegetable  ______________________________________ Fruit ___________________________________________ Meat __________________________________________ Fat ____________________________________________ LUNCH Food Group and Servings / Carb Servings Grain/Starches ___________________________________ Dairy ___________________________________________ Fruit ____________________________________________ Meat ___________________________________________ Fat _____________________________________________ Wonda Cheng Food Group and Servings / Carb Servings Grain/Starches ___________________________________ Dairy ___________________________________________ Fruit ____________________________________________ Meat ___________________________________________ Fat _____________________________________________ SNACKS Food Group and Servings / Carb Servings Grain/Starches ___________________________________ Dairy ___________________________________________ Vegetable _______________________________________ Fruit ____________________________________________ Meat ___________________________________________ Fat _____________________________________________ DAILY TOTALS Starches _________________________ Vegetable ________________________ Fruit ____________________________ Dairy ____________________________ Meat ____________________________ Fat ______________________________ Document Released: 04/29/2005 Document Revised: 10/25/2011 Document Reviewed: 03/10/2009 [employer] Patient Information 2014 [employer] Diabetes, Type 2, Am I At Risk? Diabetes is a lasting (chronic) disease. In type 2 diabetes, the pancreas does not make enough insulin, and the body does not respond normally to the insulin that is made. This type of diabetes was also previously called adult onset diabetes. About 90% of all those who have diabetes have type 2. It usually occurs after the age of 45, but can occur at any age.  People develop type 2  diabetes because they do not use insulin properly. Eventually, the pancreas cannot make enough insulin for the body's needs. Over time, the amount of glucose (sugar) in the blood increases. RISK FACTORS  Overweight  the more weight you have, the more resistant your cells become to insulin.  Family history  you are more likely to get diabetes if a parent or sibling has diabetes.  Race certain races get diabetes  more.  African Americans.  American Indians.  Asian Americans.  Hispanics.  Pacific Islander.  Inactive exercise helps control weight and helps your cells be more sensitive to insulin.  Gestational diabetes  some women develop diabetes while they are pregnant. This goes away when they deliver. However, they are 50-60% more likely to develop type 2 diabetes at a later time.  Having a baby over 9 pounds  a sign that you may have had gestational diabetes.  Age the risk of diabetes goes up as you get older, especially after age 71.  High blood pressure (hypertension). SYMPTOMS Many people have no signs or symptoms. Symptoms can be so mild that you might not even notice them. Some of these signs are:  Increased thirst.  Increased hunger.  Tiredness (fatigue).  Increased urination, especially at night.  Weight loss.  Blurred vision.  Sores that do not heal. WHO SHOULD BE TESTED?  Anyone 34 years or older, especially if overweight, should consider getting tested.  If you are younger than 73, overweight, and have one or more of the risk factors, you should consider getting tested. DIAGNOSIS  Fasting blood glucose (FBS). Usually, 2 are done.  FBS 101-125 mg/dl is considered pre-diabetes.  FBS 126 mg/dl or greater is considered diabetes.  2 hour Oral Glucose Tolerance Test (OGTT). This test is preformed by first having you not eat or drink for several hours. You are then given something sweet to drink and your blood glucose is measured fasting, at one hour and 2  hours. This test tells how well you are able to handle sugars or carbohydrates.  Fasting: 60-100 mg/dl.  1 hour: less than 200 mg/dl.  2 hours: less than 140 mg/dl.  A1c A1c is a blood glucose test that gives and average of your blood glucose over 3 months. It is the accepted method to use to diagnose diabetes.  A1c 5.7-6.4% is considered pre-diabetes.  A1c 6.5% or greater is considered diabetes. WHAT DOES IT MEAN TO HAVE PRE-DIABETES? Pre-diabetes means you are at risk for getting type 2 diabetes. Your blood glucose is higher than normal, but not yet high enough to diagnose diabetes. The good news is, if you have pre-diabetes you can reduce the risk of getting diabetes and even return to normal blood glucose levels. With modest weight loss and moderate physical activity, you can delay or prevent type 2 diabetes.  PREVENTION You cannot do anything about race, age or family history, but you can lower your chances of getting diabetes. You can:   Exercise regularly and be active.  Reduce fat and calorie intake.  Make wise food choices as much as you can.  Reduce your intake of salt and alcohol.  Maintain a reasonable weight.  Keep blood pressure in an acceptable range. Take medication if needed.  Not smoke.  Maintain an acceptable cholesterol level (HDL, LDL, Triglycerides). Take medication if needed. DOING MY PART: GETTING STARTED Making big changes in your life is hard, especially if you are faced with more than one change. You can make it easier by taking these steps:  Make a plan to change behavior.  Decide exactly what you will do and when you will do it.  Plan what you need to get ready.  Think about what might prevent you from reaching your goals.  Find family and friends who will support and encourage you.  Decide how you will reward yourself when you do what you have planned.  Your doctor, dietitian, or counselor can  help you make a plan. HERE ARE SOME OF THE  AREAS YOU MAY WISH TO CHANGE TO REDUCE YOUR RISK OF DIABETES. If you are overweight or obese, choose sensible ways to get in shape. Even small amounts of weight loss, like 5-10 pounds, can help reduce the effects of insulin resistance and help blood glucose control. Diet  Avoid crash diets. Instead, eat less of the foods you usually have. Limit the amount of fat you eat.  Increase your physical activity. Aim for at least 30 minutes of exercise most days of the week.  Set a reasonable weight-loss goal, such as losing 1 pound a week. Aim for a long-term goal of losing 5-7% of your total body weight.  Make wise food choices most of the time.  What you eat has a big impact on your health. By making wise food choices, you can help control your body weight, blood pressure, and cholesterol.  Take a hard look at the serving sizes of the foods you eat. Reduce serving sizes of meat, desserts, and foods high in fat. Increase your intake of fruits and vegetables.  Limit your fat intake to about 25% of your total calories. For example, if your food choices add up to about 2,000 calories a day, try to eat no more than 56 grams of fat. Your caregiver or a dietitian can help you figure out how much fat to have. You can check food labels for fat content too.  You may also want to reduce the number of calories you have each day.  Keep a food log. Write down what you eat, how much you eat, and anything else that helps keep you on track.  When you meet your goal, reward yourself with a nonfood item or activity. Exercise  Be physically active every day.  Keep and exercise log. Write down what exercise you did, for how long, and anything else that keeps you on track.  Regular exercise (like brisk walking) tackles several risk factors at once. It helps you lose weight, it keeps your cholesterol and blood pressure under control, and it helps your body use insulin. People who are physically active for 30 minutes  a day, 5 days a week, reduced their risk of type 2 diabetes. If you are not very active, you should start slowly at first. Talk with your caregiver first about what kinds of exercise would be safe for you. Make a plan to increase your activity level with the goal of being active for at least 30 minutes a day, most days of the week.  Choose activities you enjoy. Here are some ways to work extra activity into your daily routine:  Take the stairs rather than an elevator or escalator.  Park at the far end of the lot and walk.  Get off the bus a few stops early and walk the rest of the way.  Walk or bicycle instead of drive whenever you can. Medications Some people need medication to help control their blood pressure or cholesterol levels. If you do, take your medicines as directed. Ask your caregiver whether there are any medicines you can take to prevent type 2 diabetes. Document Released: 08/05/2003 Document Revised: 10/25/2011 Document Reviewed: 04/30/2009 [facility] Patient Information 2014 [place]. Diets for Diabetes, Food Labeling Look at food labels to help you decide how much of a product you can eat. You will want to check the amount of total carbohydrate in a serving to see how the food fits into your meal  plan. In the list of ingredients, the ingredient present in the largest amount by weight must be listed first, followed by the other ingredients in descending order. STANDARD OF IDENTITY Most products have a list of ingredients. However, foods that the Food and Drug Administration (FDA) has given a standard of identity do not need a list of ingredients. A standard of identity means that a food must contain certain ingredients if it is called a particular name. Examples are mayonnaise, peanut butter, ketchup, jelly, and cheese. LABELING TERMS There are many terms found on food labels. Some of these terms have specific definitions. Some terms are regulated by the FDA, and the FDA has  clearly specified how they can be used. Others are not regulated or well-defined and can be misleading and confusing. SPECIFICALLY DEFINED TERMS Nutritive Sweetener.  A sweetener that contains calories,such as table sugar or honey. Nonnutritive Sweetener.  A sweetener with few or no calories,such as saccharin, aspartame, sucralose, and cyclamate. LABELING TERMS REGULATED BY THE FDA Free.  The product contains only a tiny or small amount of fat, cholesterol, sodium, sugar, or calories. For example, a "fat-free" product will contain less than 0.5 g of fat per serving. Low.  A food described as "low" in fat, saturated fat, cholesterol, sodium, or calories could be eaten fairly often without exceeding dietary guidelines. For example, "low in fat" means no more than 3 g of fat per serving. Lean.  "Lean" and "extra lean" are U.S. Department of Agriculture Scientist, research (physical sciences)) terms for use on meat and poultry products. "Lean" means the product contains less than 10 g of fat, 4 g of saturated fat, and 95 mg of cholesterol per serving. "Lean" is not as low in fat as a product labeled "low." Extra Lean.  "Extra lean" means the product contains less than 5 g of fat, 2 g of saturated fat, and 95 mg of cholesterol per serving. While "extra lean" has less fat than "lean," it is still higher in fat than a product labeled "low." Reduced, Less, Fewer.  A diet product that contains 25% less of a nutrient or calories than the regular version. For example, hot dogs might be labeled "25% less fat than our regular hot dogs." Light/Lite.  A diet product that contains  fewer calories or  the fat of the original. For example, "light in sodium" means a product with  the usual sodium. More.  One serving contains at least 10% more of the daily value of a vitamin, mineral, or fiber than usual. Good Source Of.  One serving contains 10% to 19% of the daily value for a particular vitamin, mineral, or fiber. Excellent Source  Of.  One serving contains 20% or more of the daily value for a particular nutrient. Other terms used might be "high in" or "rich in." Enriched or Fortified.  The product contains added vitamins, minerals, or protein. Nutrition labeling must be used on enriched or fortified foods. Imitation.  The product has been altered so that it is lower in protein, vitamins, or minerals than the usual food,such as imitation peanut butter. Total Fat.  The number listed is the total of all fat found in a serving of the product. Under total fat, food labels must list saturated fat and trans fat, which are associated with raising bad cholesterol and an increased risk of heart blood vessel disease. Saturated Fat.  Mainly fats from animal-based sources. Some examples are red meat, cheese, cream, whole milk, and coconut oil. Trans Fat.  Found  in some fried snack foods, packaged foods, and fried restaurant foods. It is recommended you eat as close to 0 g of trans fat as possible, since it raises bad cholesterol and lowers good cholesterol. Polyunsaturated and Monounsaturated Fats.  More healthful fats. These fats are from plant sources. Total Carbohydrate.  The number of carbohydrate grams in a serving of the product. Under total carbohydrate are listed the other carbohydrate sources, such as dietary fiber and sugars. Dietary Fiber.  A carbohydrate from plant sources. Sugars.  Sugars listed on the label contain all naturally occurring sugars as well as added sugars. LABELING TERMS NOT REGULATED BY THE FDA Sugarless.  Table sugar (sucrose) has not been added. However, the manufacturer may use another form of sugar in place of sucrose to sweeten the product. For example, sugar alcohols are used to sweeten foods. Sugar alcohols are a form of sugar but are not table sugar. If a product contains sugar alcohols in place of sucrose, it can still be labeled "sugarless." Low Salt, Salt-Free, Unsalted, No Salt, No  Salt Added, Without Added Salt.  Food that is usually processed with salt has been made without salt. However, the food may contain sodium-containing additives, such as preservatives, leavening agents, or flavorings. Natural.  This term has no legal meaning. Organic.  Foods that are certified as organic have been inspected and approved by the USDA to ensure they are produced without pesticides, fertilizers containing synthetic ingredients, bioengineering, or ionizing radiation. Document Released: 08/05/2003 Document Revised: 10/25/2011 Document Reviewed: 02/20/2009 [facility] Patient Information 2014 [place], [place]. Type 2 Diabetes Mellitus, Adult Type 2 diabetes mellitus, often simply referred to as type 2 diabetes, is a long-lasting (chronic) disease. In type 2 diabetes, the pancreas does not make enough insulin (a hormone), the cells are less responsive to the insulin that is made (insulin resistance), or both. Normally, insulin moves sugars from food into the tissue cells. The tissue cells use the sugars for energy. The lack of insulin or the lack of normal response to insulin causes excess sugars to build up in the blood instead of going into the tissue cells. As a result, high blood sugar (hyperglycemia) develops. The effect of high sugar (glucose) levels can cause many complications. Type 2 diabetes was also previously called adult-onset diabetes but it can occur at any age.  RISK FACTORS  A person is predisposed to developing type 2 diabetes if someone in the family has the disease and also has one or more of the following primary risk factors:  Overweight.  An inactive lifestyle.  A history of consistently eating high-calorie foods. Maintaining a normal weight and regular physical activity can reduce the chance of developing type 2 diabetes. SYMPTOMS  A person with type 2 diabetes may not show symptoms initially. The symptoms of type 2 diabetes appear slowly. The symptoms  include:  Increased thirst (polydipsia).  Increased urination (polyuria).  Increased urination during the night (nocturia).  Weight loss. This weight loss may be rapid.  Frequent, recurring infections.  Tiredness (fatigue).  Weakness.  Vision changes, such as blurred vision.  Fruity smell to your breath.  Abdominal pain.  Nausea or vomiting.  Cuts or bruises which are slow to heal.  Tingling or numbness in the hands or feet. DIAGNOSIS Type 2 diabetes is frequently not diagnosed until complications of diabetes are present. Type 2 diabetes is diagnosed when symptoms or complications are present and when blood glucose levels are increased. Your blood glucose level may be checked by one or  more of the following blood tests:  A fasting blood glucose test. You will not be allowed to eat for at least 8 hours before a blood sample is taken.  A random blood glucose test. Your blood glucose is checked at any time of the day regardless of when you ate.  A hemoglobin A1c blood glucose test. A hemoglobin A1c test provides information about blood glucose control over the previous 3 months.  An oral glucose tolerance test (OGTT). Your blood glucose is measured after you have not eaten (fasted) for 2 hours and then after you drink a glucose-containing beverage. TREATMENT   You may need to take insulin or diabetes medicine daily to keep blood glucose levels in the desired range.  You will need to match insulin dosing with exercise and healthy food choices. The treatment goal is to maintain the before meal blood sugar (preprandial glucose) level at 70 130 mg/dL. HOME CARE INSTRUCTIONS   Have your hemoglobin A1c level checked twice a year.  Perform daily blood glucose monitoring as directed by your caregiver.  Monitor urine ketones when you are ill and as directed by your caregiver.  Take your diabetes medicine or insulin as directed by your caregiver to maintain your blood glucose  levels in the desired range.  Never run out of diabetes medicine or insulin. It is needed every day.  Adjust insulin based on your intake of carbohydrates. Carbohydrates can raise blood glucose levels but need to be included in your diet. Carbohydrates provide vitamins, minerals, and fiber which are an essential part of a healthy diet. Carbohydrates are found in fruits, vegetables, whole grains, dairy products, legumes, and foods containing added sugars.    Eat healthy foods. Alternate 3 meals with 3 snacks.  Lose weight if overweight.  Carry a medical alert card or wear your medical alert jewelry.  Carry a 15 gram carbohydrate snack with you at all times to treat low blood glucose (hypoglycemia). Some examples of 15 gram carbohydrate snacks include:  Glucose tablets, 3 or 4   Glucose gel, 15 gram tube  Raisins, 2 tablespoons (24 grams)  Jelly beans, 6  Animal crackers, 8  Regular pop, 4 ounces (120 mL)  Gummy treats, 9  Recognize hypoglycemia. Hypoglycemia occurs with blood glucose levels of 70 mg/dL and below. The risk for hypoglycemia increases when fasting or skipping meals, during or after intense exercise, and during sleep. Hypoglycemia symptoms can include:  Tremors or shakes.  Decreased ability to concentrate.  Sweating.  Increased heart rate.  Headache.  Dry mouth.  Hunger.  Irritability.  Anxiety.  Restless sleep.  Altered speech or coordination.  Confusion.  Treat hypoglycemia promptly. If you are alert and able to safely swallow, follow the 15:15 rule:  Take 15 20 grams of rapid-acting glucose or carbohydrate. Rapid-acting options include glucose gel, glucose tablets, or 4 ounces (120 mL) of fruit juice, regular soda, or low fat milk.  Check your blood glucose level 15 minutes after taking the glucose.  Take 15 20 grams more of glucose if the repeat blood glucose level is still 70 mg/dL or below.  Eat a meal or snack within 1 hour once  blood glucose levels return to normal.    Be alert to polyuria and polydipsia which are early signs of hyperglycemia. An early awareness of hyperglycemia allows for prompt treatment. Treat hyperglycemia as directed by your caregiver.  Engage in at least 150 minutes of moderate-intensity physical activity a week, spread over at least 3 days of  the week or as directed by your caregiver. In addition, you should engage in resistance exercise at least 2 times a week or as directed by your caregiver.  Adjust your medicine and food intake as needed if you start a new exercise or sport.  Follow your sick day plan at any time you are unable to eat or drink as usual.  Avoid tobacco use.  Limit alcohol intake to no more than 1 drink per day for nonpregnant women and 2 drinks per day for men. You should drink alcohol only when you are also eating food. Talk with your caregiver whether alcohol is safe for you. Tell your caregiver if you drink alcohol several times a week.  Follow up with your caregiver regularly.  Schedule an eye exam soon after the diagnosis of type 2 diabetes and then annually.  Perform daily skin and foot care. Examine your skin and feet daily for cuts, bruises, redness, nail problems, bleeding, blisters, or sores. A foot exam by a caregiver should be done annually.  Brush your teeth and gums at least twice a day and floss at least once a day. Follow up with your dentist regularly.  Share your diabetes management plan with your workplace or school.  Stay up-to-date with immunizations.  Learn to manage stress.  Obtain ongoing diabetes education and support as needed.  Participate in, or seek rehabilitation as needed to maintain or improve independence and quality of life. Request a physical or occupational therapy referral if you are having foot or hand numbness or difficulties with grooming, dressing, eating, or physical activity. SEEK MEDICAL CARE IF:   You are unable to  eat food or drink fluids for more than 6 hours.  You have nausea and vomiting for more than 6 hours.  Your blood glucose level is over 240 mg/dL.  There is a change in mental status.  You develop an additional serious illness.  You have diarrhea for more than 6 hours.  You have been sick or have had a fever for a couple of days and are not getting better.  You have pain during any physical activity.  SEEK IMMEDIATE MEDICAL CARE IF:  You have difficulty breathing.  You have moderate to large ketone levels. MAKE SURE YOU:  Understand these instructions.  Will watch your condition.  Will get help right away if you are not doing well or get worse. Document Released: 08/02/2005 Document Revised: 04/26/2012 Document Reviewed: 02/29/2012 [facility] Patient Information 2014 [place].

## 2013-12-19 NOTE — Progress Notes (Signed)
Pre-visit discussion using our clinic review tool. No additional management support is needed unless otherwise documented below in the visit note.  

## 2013-12-20 ENCOUNTER — Encounter: Payer: Self-pay | Admitting: Internal Medicine

## 2013-12-20 DIAGNOSIS — E119 Type 2 diabetes mellitus without complications: Secondary | ICD-10-CM | POA: Insufficient documentation

## 2013-12-20 NOTE — Progress Notes (Signed)
Subjective:    Patient ID: Gregory Jensen, male    DOB: Aug 01, 1956, 58 y.o.   MRN: [id number]  HPI  Subjective:    Patient ID: Gregory Jensen, male    DOB: [birth year], 58 y.o.   MRN: [id number]  HPI 58 year old patient is seen today for a preventive health examination.  He has history of metabolic syndrome and continues to smoke. He has a strong family history of coronary artery disease with a father brother and sister status post CABG. His father and sister have also had aortic valvular stenosis. Laboratory studies were reviewed recently and  due  to  worsening hypertriglyceridemia,  a fibrate  was added to statin therapy at the time of his last exam.  He no longer takes this medication, but remains on simvastatin 10 daily, aspirin.  He is sedentary with suboptimal dietary habits. He is followed closely by urology due to prostate cancer and did have a screening colonoscopy at age 18 or 43   Results for orders placed in visit on 12/13/13  CBC WITH DIFFERENTIAL      Result Value Ref Range   WBC 10.3  4.0 - 10.5 K/uL   RBC 4.61  4.22 - 5.81 MIL/uL   Hemoglobin 15.0  13.0 - 17.0 g/dL   HCT 42.0  39.0 - 52.0 %   MCV 91.1  78.0 - 100.0 fL   MCH 32.5  26.0 - 34.0 pg   MCHC 35.7  30.0 - 36.0 g/dL   RDW 13.3  11.5 - 15.5 %   Platelets 148 (*) 150 - 400 K/uL   Neutrophils Relative % 73  43 - 77 %   Neutro Abs 7.5  1.7 - 7.7 K/uL   Lymphocytes Relative 19  12 - 46 %   Lymphs Abs 2.0  0.7 - 4.0 K/uL   Monocytes Relative 6  3 - 12 %   Monocytes Absolute 0.6  0.1 - 1.0 K/uL   Eosinophils Relative 2  0 - 5 %   Eosinophils Absolute 0.2  0.0 - 0.7 K/uL   Basophils Relative 0  0 - 1 %   Basophils Absolute 0.0  0.0 - 0.1 K/uL   Smear Review Criteria for review not met    HEPATIC FUNCTION PANEL      Result Value Ref Range   Total Bilirubin 0.5  0.2 - 1.2 mg/dL   Bilirubin, Direct 0.1  0.0 - 0.3 mg/dL   Indirect Bilirubin 0.4  0.2 - 1.2 mg/dL   Alkaline Phosphatase 98  39 - 117 U/L   AST 19  0 - 37 U/L   ALT 25  0 - 53 U/L   Total Protein 6.8  6.0 - 8.3 g/dL   Albumin 4.3  3.5 - 5.2 g/dL  LIPID PANEL      Result Value Ref Range   Cholesterol 227 (*) 0 - 200 mg/dL   Triglycerides 1909 (*) <150 mg/dL   HDL 13 (*) >39 mg/dL   Total CHOL/HDL Ratio 17.5     VLDL NOT CALC  0 - 40 mg/dL   LDL Cholesterol NOT CALC  0 - 99 mg/dL  BASIC METABOLIC PANEL      Result Value Ref Range   Sodium 132 (*) 135 - 145 mEq/L   Potassium 4.0  3.5 - 5.3 mEq/L   Chloride 100  96 - 112 mEq/L   CO2 25  19 - 32 mEq/L   Glucose, Bld 204 (*) 70 - 99 mg/dL  BUN 11  6 - 23 mg/dL   Creat 0.92  0.50 - 1.35 mg/dL   Calcium 8.7  8.4 - 10.5 mg/dL  PSA      Result Value Ref Range   PSA <0.01  <=4.00 ng/mL  TSH      Result Value Ref Range   TSH 3.217  0.350 - 4.500 uIU/mL    History   Social History  . Marital Status: Divorced    Spouse Name: N/A    Number of Children: N/A  . Years of Education: N/A   Occupational History  . [employer]    Social History Main Topics  . Smoking status: Current Everyday Smoker -- 1.5 packs/day for 40 years    Types: Cigarettes  . Smokeless tobacco: Never Used  . Alcohol Use: Yes  . Drug Use: No  . Sexually Active: Not on file   Other Topics Concern  . Not on file   Social History Narrative  . No narrative on file    No past surgical history on file.  Family History  Problem Relation Age of Onset  . Diabetes      family hx  . Hypertension Sister   . Heart disease Sister   . Heart disease Father   . Alzheimer's disease Mother   . Hypertension Sister     No Known Allergies  Current Outpatient Prescriptions on File Prior to Visit  Medication Sig Dispense Refill  . aspirin 81 MG EC tablet Take 81 mg by mouth daily.        . Cholecalciferol (VITAMIN D3) 1000 UNITS capsule Take 1,000 Units by mouth daily.        . Omega-3 Fatty Acids (FISH OIL) 1200 MG CAPS Take 1,200 mg by mouth daily. 2 caps once daily       . simvastatin (ZOCOR)  40 MG tablet Take 1 tablet (40 mg total) by mouth at bedtime.  30 tablet  11  . fenofibrate 160 MG tablet Take 1 tablet (160 mg total) by mouth daily.  90 tablet  3    BP 130/80  Pulse 78  Temp(Src) 98.4 F (36.9 C) (Oral)  Resp 18  Ht 5' 8.75" (1.746 m)  Wt 207 lb (93.895 kg)  BMI 30.79 kg/m2  SpO2 98%       Review of Systems  Constitutional: Negative for fever, chills, activity change, appetite change and fatigue.  HENT: Negative for hearing loss, ear pain, congestion, rhinorrhea, sneezing, mouth sores, trouble swallowing, neck pain, neck stiffness, dental problem, voice change, sinus pressure and tinnitus.   Eyes: Negative for photophobia, pain, redness and visual disturbance.  Respiratory: Negative for apnea, cough, choking, chest tightness, shortness of breath and wheezing.   Cardiovascular: Negative for chest pain, palpitations and leg swelling.  Gastrointestinal: Negative for nausea, vomiting, abdominal pain, diarrhea, constipation, blood in stool, abdominal distention, anal bleeding and rectal pain.  Genitourinary: Negative for dysuria, urgency, frequency, hematuria, flank pain, decreased urine volume, discharge, penile swelling, scrotal swelling, difficulty urinating, genital sores and testicular pain.  Musculoskeletal: Negative for myalgias, back pain, joint swelling, arthralgias and gait problem.  Skin: Negative for color change, rash and wound.  Neurological: Negative for dizziness, tremors, seizures, syncope, facial asymmetry, speech difficulty, weakness, light-headedness, numbness (slight numbness of both feet) and headaches.  Hematological: Negative for adenopathy. Does not bruise/bleed easily.  Psychiatric/Behavioral: Negative for suicidal ideas, hallucinations, behavioral problems, confusion, sleep disturbance, self-injury, dysphoric mood, decreased concentration and agitation. The patient is not nervous/anxious.  Objective:   Physical Exam  Constitutional:  He appears well-developed and well-nourished.  HENT:  Head: Normocephalic and atraumatic.  Right Ear: External ear normal.  Left Ear: External ear normal.  Nose: Nose normal.  Mouth/Throat: Oropharynx is clear and moist.  Eyes: Conjunctivae and EOM are normal. Pupils are equal, round, and reactive to light. No scleral icterus.  Neck: Normal range of motion. Neck supple. No JVD present. No thyromegaly present.  Cardiovascular: Regular rhythm,.  Diabetes mellitus type 2 normal heart sounds and intact distal pulses.  Exam reveals no gallop and no friction rub.   No murmur heard. Pulmonary/Chest: Effort normal and breath sounds normal. He exhibits no tenderness.  Abdominal: Soft. Bowel sounds are normal. He exhibits no distension and no mass. There is no tenderness.  Genitourinary: Penis normal.  Musculoskeletal: Normal range of motion. He exhibits no edema and no tenderness.  Lymphadenopathy:    He has no cervical adenopathy.  Neurological: He is alert. He has normal reflexes. No cranial nerve deficit. Coordination normal.  Skin: Skin is warm and dry. No rash noted.  Psychiatric: He has a normal mood and affect. His behavior is normal.          Assessment & Plan:    Metabolic syndrome with mild excess obesity dyslipidemia impaired glucose tolerance and low HDL cholesterol Ongoing tobacco use Strong family history coronary artery disease  Will add a fibrate as well as omega-3 fatty acids to his regimen. Weight loss and more rigorous exercise discussed at length. We'll check a lipid profile in 3 months. We'll consider a exercise stress test for risk stratification  Wt Readings from Last 3 Encounters:  12/19/13 199 lb (90.266 kg)  09/19/13 201 lb (91.173 kg)  12/18/12 203 lb (92.08 kg)    Review of Systems     see above  Objective:   Physical Exam    As  Above      Assessment & Plan:    Preventive health examination Multiple cardiac risk factors with dyslipidemia  impaired glucose tolerance tobacco use and strong family history of coronary artery disease. Will discuss stress test for further risk stratification. Remains asymptomatic but quite sedentary DM2- will start Metformin Rx  Resume fibrates and fish oil Cessation of smoking encouraged Home BS monitoring  Modest weight loss regular exercise regimen encouraged Followup urology

## 2013-12-21 ENCOUNTER — Encounter: Payer: Self-pay | Admitting: Internal Medicine

## 2013-12-21 ENCOUNTER — Telehealth: Payer: Self-pay | Admitting: Internal Medicine

## 2013-12-21 ENCOUNTER — Ambulatory Visit (INDEPENDENT_AMBULATORY_CARE_PROVIDER_SITE_OTHER): Payer: BC Managed Care – PPO | Admitting: Internal Medicine

## 2013-12-21 VITALS — BP 118/70 | HR 89 | Temp 99.2°F | Resp 20 | Ht 68.5 in | Wt 195.0 lb

## 2013-12-21 DIAGNOSIS — J329 Chronic sinusitis, unspecified: Secondary | ICD-10-CM

## 2013-12-21 DIAGNOSIS — F17200 Nicotine dependence, unspecified, uncomplicated: Secondary | ICD-10-CM

## 2013-12-21 DIAGNOSIS — E119 Type 2 diabetes mellitus without complications: Secondary | ICD-10-CM

## 2013-12-21 MED ORDER — FENOFIBRATE 160 MG PO TABS: 160.0000 mg | ORAL_TABLET | Freq: Every day | ORAL | Status: DC

## 2013-12-21 MED ORDER — DOXYCYCLINE HYCLATE 100 MG PO TABS: 100.0000 mg | ORAL_TABLET | Freq: Two times a day (BID) | ORAL | Status: DC

## 2013-12-21 MED ORDER — HYDROCODONE-HOMATROPINE 5-1.5 MG/5ML PO SYRP: 5.0000 mL | ORAL_SOLUTION | Freq: Four times a day (QID) | ORAL | Status: DC | PRN

## 2013-12-21 NOTE — Telephone Encounter (Signed)
Discuss with Dr. Raliegh Ip, okay to change Rx to Fenofibrate 160 mg one tablet daily. New Rx sent to pharmacy. Pt aware.

## 2013-12-21 NOTE — Telephone Encounter (Signed)
Pharm margaret called to ask if we could changed the dosage of Fenofibrate 150 MG CAPS to 160MG .  She states 150mg  is an uncommon dosage and insurance company is charging an outrageous amount. To change this to 160mg  will cut copay in half. Pt has appt at 11 am today.  [facility] [place]

## 2013-12-21 NOTE — Patient Instructions (Signed)
Use saline irrigation, warm  moist compresses and over-the-counter decongestants only as directed.  Call if there is no improvement in 5 to 7 days, or sooner if you develop increasing pain, fever, or any new symptoms. 

## 2013-12-21 NOTE — Progress Notes (Signed)
Pre-visit discussion using our clinic review tool. No additional management support is needed unless otherwise documented below in the visit note.  

## 2013-12-21 NOTE — Progress Notes (Signed)
Subjective:    Patient ID: Gregory Jensen, male    DOB: 1955-09-26, 58 y.o.   MRN: [id number]  HPI  58 year old patient who was seen recently for a preventive health examination.  At that time.  URI symptoms were treated symptomatically with Allegra-D.  He feels he has clinically worsened with refractory cough quite bothersome through the night.  He's developed more productive cough, as well as yellow sinus drainage.  He has had progressive headache and some sore throat, fever.   History reviewed. No pertinent past medical history.  History   Social History  . Marital Status: Divorced    Spouse Name: N/A    Number of Children: N/A  . Years of Education: N/A   Occupational History  . [employer]    Social History Main Topics  . Smoking status: Current Every Day Smoker -- 1.50 packs/day for 40 years    Types: Cigarettes  . Smokeless tobacco: Never Used  . Alcohol Use: Yes  . Drug Use: No  . Sexual Activity: Not on file   Other Topics Concern  . Not on file   Social History Narrative  . No narrative on file    History reviewed. No pertinent past surgical history.  Family History  Problem Relation Age of Onset  . Diabetes      family hx  . Hypertension Sister   . Heart disease Sister   . Heart disease Father   . Alzheimer's disease Mother   . Hypertension Sister     No Known Allergies  Current Outpatient Prescriptions on File Prior to Visit  Medication Sig Dispense Refill  . aspirin 81 MG EC tablet Take 81 mg by mouth daily.        Marland Kitchen atorvastatin (LIPITOR) 40 MG tablet Take 1 tablet (40 mg total) by mouth daily.  90 tablet  3  . glucose blood (ONETOUCH VERIO) test strip 1 each by Other route daily as needed for other. Use as instructed  100 each  4  . metFORMIN (GLUCOPHAGE) 500 MG tablet Take 1 tablet (500 mg total) by mouth 2 (two) times daily with a meal.  180 tablet  3  . ONETOUCH DELICA LANCETS 10F MISC 1 each by Does not apply route daily as needed.   100 each  4   No current facility-administered medications on file prior to visit.    BP 118/70  Pulse 89  Temp(Src) 99.2 F (37.3 C) (Oral)  Resp 20  Ht 5' 8.5" (1.74 m)  Wt 195 lb (88.451 kg)  BMI 29.21 kg/m2  SpO2 97%     Review of Systems  Constitutional: Positive for fever, activity change, appetite change and fatigue. Negative for chills.  HENT: Positive for congestion, postnasal drip, rhinorrhea and sinus pressure. Negative for dental problem, ear pain, hearing loss, sore throat, tinnitus, trouble swallowing and voice change.   Eyes: Negative for pain, discharge and visual disturbance.  Respiratory: Positive for cough. Negative for chest tightness, wheezing and stridor.   Cardiovascular: Negative for chest pain, palpitations and leg swelling.  Gastrointestinal: Negative for nausea, vomiting, abdominal pain, diarrhea, constipation, blood in stool and abdominal distention.  Genitourinary: Negative for urgency, hematuria, flank pain, discharge, difficulty urinating and genital sores.  Musculoskeletal: Negative for arthralgias, back pain, gait problem, joint swelling, myalgias and neck stiffness.  Skin: Negative for rash.  Neurological: Positive for headaches. Negative for dizziness, syncope, speech difficulty, weakness and numbness.  Hematological: Negative for adenopathy. Does not bruise/bleed easily.  Psychiatric/Behavioral:  Negative for behavioral problems and dysphoric mood. The patient is not nervous/anxious.        Objective:   Physical Exam  Constitutional: He is oriented to person, place, and time. He appears well-developed.  Appears unwell temperature 9 .2   HENT:  Head: Normocephalic.  Right Ear: External ear normal.  Left Ear: External ear normal.  Eyes: Conjunctivae and EOM are normal.  Neck: Normal range of motion.  Cardiovascular: Normal rate and normal heart sounds.   Pulmonary/Chest: Breath sounds normal.  Abdominal: Bowel sounds are normal.    Musculoskeletal: Normal range of motion. He exhibits no edema and no tenderness.  Neurological: He is alert and oriented to person, place, and time.  Psychiatric: He has a normal mood and affect. His behavior is normal.          Assessment & Plan:   URI with sinusitis - will treat with doxy and cont expectorants and decongestants DM Metformin started

## 2014-01-16 ENCOUNTER — Ambulatory Visit (INDEPENDENT_AMBULATORY_CARE_PROVIDER_SITE_OTHER): Payer: BC Managed Care – PPO | Admitting: Internal Medicine

## 2014-01-16 ENCOUNTER — Encounter: Payer: Self-pay | Admitting: Internal Medicine

## 2014-01-16 VITALS — BP 110/62 | HR 69 | Temp 98.1°F | Resp 20 | Ht 68.5 in | Wt 187.0 lb

## 2014-01-16 DIAGNOSIS — E785 Hyperlipidemia, unspecified: Secondary | ICD-10-CM

## 2014-01-16 DIAGNOSIS — E119 Type 2 diabetes mellitus without complications: Secondary | ICD-10-CM

## 2014-01-16 DIAGNOSIS — F17200 Nicotine dependence, unspecified, uncomplicated: Secondary | ICD-10-CM

## 2014-01-16 NOTE — Progress Notes (Signed)
Pre-visit discussion using our clinic review tool. No additional management support is needed unless otherwise documented below in the visit note.  

## 2014-01-16 NOTE — Patient Instructions (Signed)
It is important that you exercise regularly, at least 20 minutes 3 to 4 times per week.  If you develop chest pain or shortness of breath seek  medical attention.  Add omega-3 fatty acids to your regimenHypertriglyceridemia  Diet for High blood levels of Triglycerides Most fats in food are triglycerides. Triglycerides in your blood are stored as fat in your body. High levels of triglycerides in your blood may put you at a greater risk for heart disease and stroke.  Normal triglyceride levels are less than 150 mg/dL. Borderline high levels are 150-199 mg/dl. High levels are 200 - 499 mg/dL, and very high triglyceride levels are greater than 500 mg/dL. The decision to treat high triglycerides is generally based on the level. For people with borderline or high triglyceride levels, treatment includes weight loss and exercise. Drugs are recommended for people with very high triglyceride levels. Many people who need treatment for high triglyceride levels have metabolic syndrome. This syndrome is a collection of disorders that often include: insulin resistance, high blood pressure, blood clotting problems, high cholesterol and triglycerides. TESTING PROCEDURE FOR TRIGLYCERIDES  You should not eat 4 hours before getting your triglycerides measured. The normal range of triglycerides is between 10 and 250 milligrams per deciliter (mg/dl). Some people may have extreme levels (1000 or above), but your triglyceride level may be too high if it is above 150 mg/dl, depending on what other risk factors you have for heart disease.  People with high blood triglycerides may also have high blood cholesterol levels. If you have high blood cholesterol as well as high blood triglycerides, your risk for heart disease is probably greater than if you only had high triglycerides. High blood cholesterol is one of the main risk factors for heart disease. CHANGING YOUR DIET  Your weight can affect your blood triglyceride level. If  you are more than 20% above your ideal body weight, you may be able to lower your blood triglycerides by losing weight. Eating less and exercising regularly is the best way to combat this. Fat provides more calories than any other food. The best way to lose weight is to eat less fat. Only 30% of your total calories should come from fat. Less than 7% of your diet should come from saturated fat. A diet low in fat and saturated fat is the same as a diet to decrease blood cholesterol. By eating a diet lower in fat, you may lose weight, lower your blood cholesterol, and lower your blood triglyceride level.  Eating a diet low in fat, especially saturated fat, may also help you lower your blood triglyceride level. Ask your dietitian to help you figure how much fat you can eat based on the number of calories your caregiver has prescribed for you.  Exercise, in addition to helping with weight loss may also help lower triglyceride levels.   Alcohol can increase blood triglycerides. You may need to stop drinking alcoholic beverages.  Too much carbohydrate in your diet may also increase your blood triglycerides. Some complex carbohydrates are necessary in your diet. These may include bread, rice, potatoes, other starchy vegetables and cereals.  Reduce "simple" carbohydrates. These may include pure sugars, candy, honey, and jelly without losing other nutrients. If you have the kind of high blood triglycerides that is affected by the amount of carbohydrates in your diet, you will need to eat less sugar and less high-sugar foods. Your caregiver can help you with this.  Adding 2-4 grams of fish oil (EPA+ DHA) may  also help lower triglycerides. Speak with your caregiver before adding any supplements to your regimen. Following the Diet  Maintain your ideal weight. Your caregivers can help you with a diet. Generally, eating less food and getting more exercise will help you lose weight. Joining a weight control group may  also help. Ask your caregivers for a good weight control group in your area.  Eat low-fat foods instead of high-fat foods. This can help you lose weight too.  These foods are lower in fat. Eat MORE of these:   Dried beans, peas, and lentils.  Egg whites.  Low-fat cottage cheese.  Fish.  Lean cuts of meat, such as round, sirloin, rump, and flank (cut extra fat off meat you fix).  Whole grain breads, cereals and pasta.  Skim and nonfat dry milk.  Low-fat yogurt.  Poultry without the skin.  Cheese made with skim or part-skim milk, such as mozzarella, parmesan, farmers', ricotta, or pot cheese. These are higher fat foods. Eat LESS of these:   Whole milk and foods made from whole milk, such as American, blue, cheddar, monterey jack, and swiss cheese  High-fat meats, such as luncheon meats, sausages, knockwurst, bratwurst, hot dogs, ribs, corned beef, ground pork, and regular ground beef.  Fried foods. Limit saturated fats in your diet. Substituting unsaturated fat for saturated fat may decrease your blood triglyceride level. You will need to read package labels to know which products contain saturated fats.  These foods are high in saturated fat. Eat LESS of these:   Fried pork skins.  Whole milk.  Skin and fat from poultry.  Palm oil.  Butter.  Shortening.  Cream cheese.  Berniece Salines.  Margarines and baked goods made from listed oils.  Vegetable shortenings.  Chitterlings.  Fat from meats.  Coconut oil.  Palm kernel oil.  Lard.  Cream.  Sour cream.  Fatback.  Coffee whiteners and non-dairy creamers made with these oils.  Cheese made from whole milk. Use unsaturated fats (both polyunsaturated and monounsaturated) moderately. Remember, even though unsaturated fats are better than saturated fats; you still want a diet low in total fat.  These foods are high in unsaturated fat:   Canola oil.  Sunflower oil.  Mayonnaise.  Almonds.  Peanuts.  Pine  nuts.  Margarines made with these oils.  Safflower oil.  Olive oil.  Avocados.  Cashews.  Peanut butter.  Sunflower seeds.  Soybean oil.  Peanut oil.  Olives.  Pecans.  Walnuts.  Pumpkin seeds. Avoid sugar and other high-sugar foods. This will decrease carbohydrates without decreasing other nutrients. Sugar in your food goes rapidly to your blood. When there is excess sugar in your blood, your liver may use it to make more triglycerides. Sugar also contains calories without other important nutrients.  Eat LESS of these:   Sugar, brown sugar, powdered sugar, jam, jelly, preserves, honey, syrup, molasses, pies, candy, cakes, cookies, frosting, pastries, colas, soft drinks, punches, fruit drinks, and regular gelatin.  Avoid alcohol. Alcohol, even more than sugar, may increase blood triglycerides. In addition, alcohol is high in calories and low in nutrients. Ask for sparkling water, or a diet soft drink instead of an alcoholic beverage. Suggestions for planning and preparing meals   Bake, broil, grill or roast meats instead of frying.  Remove fat from meats and skin from poultry before cooking.  Add spices, herbs, lemon juice or vinegar to vegetables instead of salt, rich sauces or gravies.  Use a non-stick skillet without fat or use no-stick sprays.  Cool and refrigerate stews and broth. Then remove the hardened fat floating on the surface before serving.  Refrigerate meat drippings and skim off fat to make low-fat gravies.  Serve more fish.  Use less butter, margarine and other high-fat spreads on bread or vegetables.  Use skim or reconstituted non-fat dry milk for cooking.  Cook with low-fat cheeses.  Substitute low-fat yogurt or cottage cheese for all or part of the sour cream in recipes for sauces, dips or congealed salads.  Use half yogurt/half mayonnaise in salad recipes.  Substitute evaporated skim milk for cream. Evaporated skim milk or reconstituted  non-fat dry milk can be whipped and substituted for whipped cream in certain recipes.  Choose fresh fruits for dessert instead of high-fat foods such as pies or cakes. Fruits are naturally low in fat. When Dining Out   Order low-fat appetizers such as fruit or vegetable juice, pasta with vegetables or tomato sauce.  Select clear, rather than cream soups.  Ask that dressings and gravies be served on the side. Then use less of them.  Order foods that are baked, broiled, poached, steamed, stir-fried, or roasted.  Ask for margarine instead of butter, and use only a small amount.  Drink sparkling water, unsweetened tea or coffee, or diet soft drinks instead of alcohol or other sweet beverages. QUESTIONS AND ANSWERS ABOUT OTHER FATS IN THE BLOOD: SATURATED FAT, TRANS FAT, AND CHOLESTEROL What is trans fat? Trans fat is a type of fat that is formed when vegetable oil is hardened through a process called hydrogenation. This process helps makes foods more solid, gives them shape, and prolongs their shelf life. Trans fats are also called hydrogenated or partially hydrogenated oils.  What do saturated fat, trans fat, and cholesterol in foods have to do with heart disease? Saturated fat, trans fat, and cholesterol in the diet all raise the level of LDL "bad" cholesterol in the blood. The higher the LDL cholesterol, the greater the risk for coronary heart disease (CHD). Saturated fat and trans fat raise LDL similarly.  What foods contain saturated fat, trans fat, and cholesterol? High amounts of saturated fat are found in animal products, such as fatty cuts of meat, chicken skin, and full-fat dairy products like butter, whole milk, cream, and cheese, and in tropical vegetable oils such as palm, palm kernel, and coconut oil. Trans fat is found in some of the same foods as saturated fat, such as vegetable shortening, some margarines (especially hard or stick margarine), crackers, cookies, baked goods, fried  foods, salad dressings, and other processed foods made with partially hydrogenated vegetable oils. Small amounts of trans fat also occur naturally in some animal products, such as milk products, beef, and lamb. Foods high in cholesterol include liver, other organ meats, egg yolks, shrimp, and full-fat dairy products. How can I use the new food label to make heart-healthy food choices? Check the Nutrition Facts panel of the food label. Choose foods lower in saturated fat, trans fat, and cholesterol. For saturated fat and cholesterol, you can also use the Percent Daily Value (%DV): 5% DV or less is low, and 20% DV or more is high. (There is no %DV for trans fat.) Use the Nutrition Facts panel to choose foods low in saturated fat and cholesterol, and if the trans fat is not listed, read the ingredients and limit products that list shortening or hydrogenated or partially hydrogenated vegetable oil, which tend to be high in trans fat. POINTS TO REMEMBER:   Discuss your risk  for heart disease with your caregivers, and take steps to reduce risk factors.  Change your diet. Choose foods that are low in saturated fat, trans fat, and cholesterol.  Add exercise to your daily routine if it is not already being done. Participate in physical activity of moderate intensity, like brisk walking, for at least 30 minutes on most, and preferably all days of the week. No time? Break the 30 minutes into three, 10-minute segments during the day.  Stop smoking. If you do smoke, contact your caregiver to discuss ways in which they can help you quit.  Do not use street drugs.  Maintain a normal weight.  Maintain a healthy blood pressure.  Keep up with your blood work for checking the fats in your blood as directed by your caregiver. Document Released: 05/20/2004 Document Revised: 02/01/2012 Document Reviewed: 12/16/2008 [facility] Patient Information 2014 [place]. Triglycerides, TG, TRIG This is a test to check  your risk of developing heart disease. It is often done as part of a lipid profile during a regular medical exam or if you are being treated for high triglycerides. This test measures the amount of triglycerides in your blood. Triglycerides are the body's storage form for fat. Most triglycerides are found in fat tissue. Some triglycerides circulate in the blood to provide fuel for muscles to work. Extra triglycerides are found in the blood after eating a meal when fat is being sent from the gut to fat tissue for storage. The test for triglycerides should be done when you are fasting and no extra triglycerides from a recent meal are present.  SAMPLE COLLECTION The test for triglycerides uses a blood sample. Most often, the blood sample is collected using a needle to collect blood from a vein. Sometimes triglycerides are measured using a drop of blood collected by puncturing the skin on a finger. Testing should be done when you are fasting. For 12 to 14 hours before the test, only water is permitted. In addition, alcohol should not be consumed for the 24 hours just before the test. If you are diabetic and your blood sugar is out of control, triglycerides will be very high. NORMAL FINDINGS  Adult/elderly  Male: 40-160 mg/dL or 0.45-1.81 mmol/L (SI units)  Male: 35-135 mg/dL or 0.40-1.52 mmol/L (SI units)  0-59 years  Male: 30-86 mg/dL  Male: 32-99 mg/dL  6-59 years  Male: 31-108 mg/dL  Male: 35-114 mg/dL  12-59 years  Male: 36-138 mg/dL  Male: 41-138 mg/dL  16-59 years  Male: 40-163 mg/dL  Male: 40-128 mg/dL Ranges for normal findings may vary among different laboratories and hospitals. You should always check with your doctor after having lab work or other tests done to discuss the meaning of your test results and whether your values are considered within normal limits. MEANING OF TEST  Your caregiver will go over the test results with you and discuss the importance and  meaning of your results, as well as treatment options and the need for additional tests if necessary. OBTAINING THE TEST RESULTS It is your responsibility to obtain your test results. Ask the lab or department performing the test when and how you will get your results. Document Released: 09/04/2004 Document Revised: 10/25/2011 Document Reviewed: 07/14/2008 [facility] Patient Information 2014 [place], [place].

## 2014-01-16 NOTE — Progress Notes (Signed)
Subjective:    Patient ID: Gregory Jensen, male    DOB: [birth year], 58 y.o.   MRN: [id number]  HPI  58 year old patient who has placed on metformin therapy due to diabetes.  A random blood sugar was greater than 200.  He has a history of hypertriglyceridemia and has also recently placed on fibrate therapy.  He remains on statin therapy.  He has tolerated this medication well.  He is adhering to a much better.  Diet and there has been some modest weight loss.  2 days ago.  He has made a significant effort to discontinue tobacco use.  No concerns or complaints today.  History reviewed. No pertinent past medical history.  History   Social History  . Marital Status: Divorced    Spouse Name: N/A    Number of Children: N/A  . Years of Education: N/A   Occupational History  . [employer]    Social History Main Topics  . Smoking status: Current Every Day Smoker -- 1.50 packs/day for 40 years    Types: Cigarettes  . Smokeless tobacco: Never Used  . Alcohol Use: Yes  . Drug Use: No  . Sexual Activity: Not on file   Other Topics Concern  . Not on file   Social History Narrative  . No narrative on file    History reviewed. No pertinent past surgical history.  Family History  Problem Relation Age of Onset  . Diabetes      family hx  . Hypertension Sister   . Heart disease Sister   . Heart disease Father   . Alzheimer's disease Mother   . Hypertension Sister     No Known Allergies  Current Outpatient Prescriptions on File Prior to Visit  Medication Sig Dispense Refill  . aspirin 81 MG EC tablet Take 81 mg by mouth daily.        Marland Kitchen atorvastatin (LIPITOR) 40 MG tablet Take 1 tablet (40 mg total) by mouth daily.  90 tablet  3  . fenofibrate 160 MG tablet Take 1 tablet (160 mg total) by mouth daily.  90 tablet  3  . glucose blood (ONETOUCH VERIO) test strip 1 each by Other route daily as needed for other. Use as instructed  100 each  4  . metFORMIN (GLUCOPHAGE) 500 MG  tablet Take 1 tablet (500 mg total) by mouth 2 (two) times daily with a meal.  180 tablet  3  . ONETOUCH DELICA LANCETS 38G MISC 1 each by Does not apply route daily as needed.  100 each  4  . HYDROcodone-homatropine (HYCODAN) 5-1.5 MG/5ML syrup Take 5 mLs by mouth every 6 (six) hours as needed for cough.  120 mL  0   No current facility-administered medications on file prior to visit.    BP 110/62  Pulse 69  Temp(Src) 98.1 F (36.7 C) (Oral)  Resp 20  Ht 5' 8.5" (1.74 m)  Wt 187 lb (84.823 kg)  BMI 28.02 kg/m2  SpO2 98%       Review of Systems  Constitutional: Negative for fever, chills, appetite change and fatigue.  HENT: Negative for congestion, dental problem, ear pain, hearing loss, sore throat, tinnitus, trouble swallowing and voice change.   Eyes: Negative for pain, discharge and visual disturbance.  Respiratory: Negative for cough, chest tightness, wheezing and stridor.   Cardiovascular: Negative for chest pain, palpitations and leg swelling.  Gastrointestinal: Negative for nausea, vomiting, abdominal pain, diarrhea, constipation, blood in stool and abdominal distention.  Genitourinary: Negative for urgency, hematuria, flank pain, discharge, difficulty urinating and genital sores.  Musculoskeletal: Negative for arthralgias, back pain, gait problem, joint swelling, myalgias and neck stiffness.  Skin: Negative for rash.  Neurological: Negative for dizziness, syncope, speech difficulty, weakness, numbness and headaches.  Hematological: Negative for adenopathy. Does not bruise/bleed easily.  Psychiatric/Behavioral: Negative for behavioral problems and dysphoric mood. The patient is not nervous/anxious.        Objective:   Physical Exam  Constitutional: He is oriented to person, place, and time. He appears well-developed.  HENT:  Head: Normocephalic.  Right Ear: External ear normal.  Left Ear: External ear normal.  Eyes: Conjunctivae and EOM are normal.  Neck: Normal  range of motion.  Cardiovascular: Normal rate and normal heart sounds.   Pulmonary/Chest: Breath sounds normal.  Abdominal: Bowel sounds are normal.  Musculoskeletal: Normal range of motion. He exhibits no edema and no tenderness.  Neurological: He is alert and oriented to person, place, and time.  Psychiatric: He has a normal mood and affect. His behavior is normal.          Assessment & Plan:   Diabetes mellitus.  We'll continue metformin 500 twice a day.  Check hemoglobin A1c in 3 months Hypertriglyceridemia.  We'll continue fibrate therapy and diet.  We'll check lipid panel in 3 months Tobacco use.  Continue efforts at smoking cessation

## 2014-01-21 ENCOUNTER — Telehealth: Payer: Self-pay

## 2014-01-21 NOTE — Telephone Encounter (Signed)
Relevant patient education assigned to patient using Emmi. ° °

## 2014-04-15 ENCOUNTER — Encounter: Payer: Self-pay | Admitting: Internal Medicine

## 2014-04-15 ENCOUNTER — Ambulatory Visit (INDEPENDENT_AMBULATORY_CARE_PROVIDER_SITE_OTHER): Payer: BC Managed Care – PPO | Admitting: Internal Medicine

## 2014-04-15 VITALS — BP 97/60 | HR 61 | Temp 98.1°F | Resp 18 | Ht 68.5 in | Wt 180.0 lb

## 2014-04-15 DIAGNOSIS — F17200 Nicotine dependence, unspecified, uncomplicated: Secondary | ICD-10-CM

## 2014-04-15 DIAGNOSIS — E785 Hyperlipidemia, unspecified: Secondary | ICD-10-CM

## 2014-04-15 DIAGNOSIS — E1165 Type 2 diabetes mellitus with hyperglycemia: Secondary | ICD-10-CM

## 2014-04-15 DIAGNOSIS — E119 Type 2 diabetes mellitus without complications: Secondary | ICD-10-CM

## 2014-04-15 DIAGNOSIS — E8881 Metabolic syndrome: Secondary | ICD-10-CM

## 2014-04-15 LAB — LIPID PANEL
Cholesterol: 91 mg/dL (ref 0–200)
HDL: 21.4 mg/dL — ABNORMAL LOW (ref >39.00)
LDL Cholesterol: 49 mg/dL (ref 0–99)
NonHDL: 69.6
Total CHOL/HDL Ratio: 4
Triglycerides: 105 mg/dL (ref 0.0–149.0)
VLDL: 21 mg/dL (ref 0.0–40.0)

## 2014-04-15 LAB — HEMOGLOBIN A1C: Hgb A1c MFr Bld: 6 % (ref 4.6–6.5)

## 2014-04-15 MED ORDER — METFORMIN HCL ER (MOD) 500 MG PO TB24: 1000.0000 mg | ORAL_TABLET | Freq: Every day | ORAL | Status: DC

## 2014-04-15 NOTE — Progress Notes (Signed)
Pre visit review using our clinic review tool, if applicable. No additional management support is needed unless otherwise documented below in the visit note. 

## 2014-04-15 NOTE — Progress Notes (Signed)
Subjective:    Patient ID: Gregory Jensen, male    DOB: 1955-09-21, 58 y.o.   MRN: [id number]  HPI  58 year old patient who has a history of impaired glucose tolerance.  A recent diagnosis of diabetes was made with a random blood sugar of greater than 200.  He has been on metformin 500 mg twice a day.  He often omits the second daily dose.  He has done well and tolerated the medication well. He was also resumed on statin therapy He has discontinued tobacco use as of June 1  He faees ls well today  History reviewed. No pertinent past medical history.  History   Social History  . Marital Status: Divorced    Spouse Name: N/A    Number of Children: N/A  . Years of Education: N/A   Occupational History  . [employer]    Social History Main Topics  . Smoking status: Current Every Day Smoker -- 1.50 packs/day for 40 years    Types: Cigarettes  . Smokeless tobacco: Never Used  . Alcohol Use: Yes  . Drug Use: No  . Sexual Activity: Not on file   Other Topics Concern  . Not on file   Social History Narrative  . No narrative on file    History reviewed. No pertinent past surgical history.  Family History  Problem Relation Age of Onset  . Diabetes      family hx  . Hypertension Sister   . Heart disease Sister   . Heart disease Father   . Alzheimer's disease Mother   . Hypertension Sister     No Known Allergies  Current Outpatient Prescriptions on File Prior to Visit  Medication Sig Dispense Refill  . aspirin 81 MG EC tablet Take 81 mg by mouth daily.        Marland Kitchen atorvastatin (LIPITOR) 40 MG tablet Take 1 tablet (40 mg total) by mouth daily.  90 tablet  3  . fenofibrate 160 MG tablet Take 1 tablet (160 mg total) by mouth daily.  90 tablet  3  . glucose blood (ONETOUCH VERIO) test strip 1 each by Other route daily as needed for other. Use as instructed  100 each  4  . metFORMIN (GLUCOPHAGE) 500 MG tablet Take 1 tablet (500 mg total) by mouth 2 (two) times daily  with a meal.  180 tablet  3  . ONETOUCH DELICA LANCETS 81X MISC 1 each by Does not apply route daily as needed.  100 each  4   No current facility-administered medications on file prior to visit.    BP 97/60  Pulse 61  Temp(Src) 98.1 F (36.7 C) (Oral)  Resp 18  Ht 5' 8.5" (1.74 m)  Wt 180 lb (81.647 kg)  BMI 26.97 kg/m2    Review of Systems  Constitutional: Negative for fever, chills, appetite change and fatigue.  HENT: Negative for congestion, dental problem, ear pain, hearing loss, sore throat, tinnitus, trouble swallowing and voice change.   Eyes: Negative for pain, discharge and visual disturbance.  Respiratory: Negative for cough, chest tightness, wheezing and stridor.   Cardiovascular: Negative for chest pain, palpitations and leg swelling.  Gastrointestinal: Negative for nausea, vomiting, abdominal pain, diarrhea, constipation, blood in stool and abdominal distention.  Genitourinary: Negative for urgency, hematuria, flank pain, discharge, difficulty urinating and genital sores.  Musculoskeletal: Negative for arthralgias, back pain, gait problem, joint swelling, myalgias and neck stiffness.  Skin: Negative for rash.  Neurological: Negative for dizziness, syncope, speech  difficulty, weakness, numbness and headaches.  Hematological: Negative for adenopathy. Does not bruise/bleed easily.  Psychiatric/Behavioral: Negative for behavioral problems and dysphoric mood. The patient is not nervous/anxious.        Objective:   Physical Exam  Constitutional: He is oriented to person, place, and time. He appears well-developed.  Blood pressure 100/52  HENT:  Head: Normocephalic.  Right Ear: External ear normal.  Left Ear: External ear normal.  Eyes: Conjunctivae and EOM are normal.  Neck: Normal range of motion.  Cardiovascular: Normal rate and normal heart sounds.   Pulmonary/Chest: Breath sounds normal.  Abdominal: Bowel sounds are normal.  Musculoskeletal: Normal range of  motion. He exhibits no edema and no tenderness.  Neurological: He is alert and oriented to person, place, and time.  Psychiatric: He has a normal mood and affect. His behavior is normal.          Assessment & Plan:   Diabetes mellitus.  We'll check a hemoglobin A1c.  Will switch to extended release metformin therapy Dyslipidemia.  Will continue statin therapy.  Check lipid profile History tobacco use  Recheck 3 months

## 2014-04-15 NOTE — Patient Instructions (Signed)
Please check your hemoglobin A1c every 3 months    It is important that you exercise regularly, at least 20 minutes 3 to 4 times per week.  If you develop chest pain or shortness of breath seek  medical attention.  Please see your eye doctor yearly to check for diabetic eye damage

## 2014-04-18 ENCOUNTER — Telehealth: Payer: Self-pay | Admitting: Internal Medicine

## 2014-04-18 NOTE — Telephone Encounter (Signed)
Spoke to pt, gave pt results for Lipid panel and Hemoglobin A1c. Pt verbalized understanding.

## 2014-04-18 NOTE — Telephone Encounter (Signed)
Pt would like a call about his lab results .Marland Kitchen

## 2014-04-23 ENCOUNTER — Telehealth: Payer: Self-pay | Admitting: *Deleted

## 2014-04-23 NOTE — Telephone Encounter (Signed)
Spoke to pt, told him Hemoglobin A1c came back at 6.0 and Cholesterol and Triglycerides were normal. Pt verbalized understanding.

## 2014-07-08 ENCOUNTER — Encounter: Payer: Self-pay | Admitting: Family Medicine

## 2014-07-08 ENCOUNTER — Ambulatory Visit (INDEPENDENT_AMBULATORY_CARE_PROVIDER_SITE_OTHER): Payer: BC Managed Care – PPO | Admitting: Family Medicine

## 2014-07-08 VITALS — BP 111/60 | HR 64 | Temp 98.0°F | Ht 68.5 in | Wt 187.0 lb

## 2014-07-08 DIAGNOSIS — H109 Unspecified conjunctivitis: Secondary | ICD-10-CM

## 2014-07-08 DIAGNOSIS — J0100 Acute maxillary sinusitis, unspecified: Secondary | ICD-10-CM

## 2014-07-08 MED ORDER — AZITHROMYCIN 250 MG PO TABS: ORAL_TABLET | ORAL | Status: DC

## 2014-07-08 MED ORDER — NEOMYCIN-POLYMYXIN-HC 3.5-10000-1 OP SUSP: 4.0000 [drp] | Freq: Four times a day (QID) | OPHTHALMIC | Status: DC

## 2014-07-08 NOTE — Progress Notes (Signed)
   Subjective:    Patient ID: Gregory Jensen, male    DOB: 1956-07-28, 58 y.o.   MRN: [id number]  HPI Here for one week of sinus pressure, PND, ST ,and a dry cough. Now for 2 days he also has redness and itching in the left eye with a yellow DC.    Review of Systems  Constitutional: Negative.   HENT: Positive for congestion, postnasal drip and sinus pressure.   Eyes: Positive for discharge, redness and itching. Negative for photophobia and pain.  Respiratory: Positive for cough.        Objective:   Physical Exam  Constitutional: He appears well-developed and well-nourished.  HENT:  Right Ear: External ear normal.  Left Ear: External ear normal.  Nose: Nose normal.  Mouth/Throat: Oropharynx is clear and moist.  Eyes: Pupils are equal, round, and reactive to light. Right eye exhibits no discharge. Left eye exhibits no discharge.  Left conjunctiva is red, the right is clear. No photophobia   Pulmonary/Chest: Effort normal and breath sounds normal.  Lymphadenopathy:    He has no cervical adenopathy.          Assessment & Plan:  Treat the sinusitis with a Zpack. Use Cortisporin drops and hot compresses for the eye.

## 2014-07-08 NOTE — Progress Notes (Signed)
Pre visit review using our clinic review tool, if applicable. No additional management support is needed unless otherwise documented below in the visit note. 

## 2014-07-10 ENCOUNTER — Telehealth: Payer: Self-pay | Admitting: Family Medicine

## 2014-07-10 MED ORDER — NEOMYCIN-POLYMYXIN-DEXAMETH 0.1 % OP SUSP: 2.0000 [drp] | Freq: Four times a day (QID) | OPHTHALMIC | Status: DC

## 2014-07-10 NOTE — Telephone Encounter (Signed)
Request to change eye drops due to cost of $ 120 out of pocket. Can we substitute Maxitrol, the cost is a $ 21 co pay?

## 2014-07-10 NOTE — Telephone Encounter (Signed)
Per Dr. Sarajane Jews change to Maxitrol suspension 2 drops to left eye 4 times a day. I did send new script e-scribe.

## 2014-07-15 ENCOUNTER — Encounter: Payer: Self-pay | Admitting: Internal Medicine

## 2014-07-15 ENCOUNTER — Ambulatory Visit (INDEPENDENT_AMBULATORY_CARE_PROVIDER_SITE_OTHER): Payer: BC Managed Care – PPO | Admitting: Internal Medicine

## 2014-07-15 VITALS — BP 128/72 | HR 76 | Temp 98.0°F | Resp 20 | Ht 68.5 in | Wt 190.0 lb

## 2014-07-15 DIAGNOSIS — M25511 Pain in right shoulder: Secondary | ICD-10-CM

## 2014-07-15 DIAGNOSIS — E109 Type 1 diabetes mellitus without complications: Secondary | ICD-10-CM | POA: Diagnosis not present

## 2014-07-15 DIAGNOSIS — F17200 Nicotine dependence, unspecified, uncomplicated: Secondary | ICD-10-CM

## 2014-07-15 NOTE — Progress Notes (Signed)
Pre visit review using our clinic review tool, if applicable. No additional management support is needed unless otherwise documented below in the visit note. 

## 2014-07-15 NOTE — Patient Instructions (Signed)
Smoking tobacco is very bad for your health. You should stop smoking immediately.Impingement Syndrome, Rotator Cuff, Bursitis with Rehab Impingement syndrome is a condition that involves inflammation of the tendons of the rotator cuff and the subacromial bursa, that causes pain in the shoulder. The rotator cuff consists of four tendons and muscles that control much of the shoulder and upper arm function. The subacromial bursa is a fluid filled sac that helps reduce friction between the rotator cuff and one of the bones of the shoulder (acromion). Impingement syndrome is usually an overuse injury that causes swelling of the bursa (bursitis), swelling of the tendon (tendonitis), and/or a tear of the tendon (strain). Strains are classified into three categories. Grade 1 strains cause pain, but the tendon is not lengthened. Grade 2 strains include a lengthened ligament, due to the ligament being stretched or partially ruptured. With grade 2 strains there is still function, although the function may be decreased. Grade 3 strains include a complete tear of the tendon or muscle, and function is usually impaired. SYMPTOMS   Pain around the shoulder, often at the outer portion of the upper arm.  Pain that gets worse with shoulder function, especially when reaching overhead or lifting.  Sometimes, aching when not using the arm.  Pain that wakes you up at night.  Sometimes, tenderness, swelling, warmth, or redness over the affected area.  Loss of strength.  Limited motion of the shoulder, especially reaching behind the back (to the back pocket or to unhook bra) or across your body.  Crackling sound (crepitation) when moving the arm.  Biceps tendon pain and inflammation (in the front of the shoulder). Worse when bending the elbow or lifting. CAUSES  Impingement syndrome is often an overuse injury, in which chronic (repetitive) motions cause the tendons or bursa to become inflamed. A strain occurs when a  force is paced on the tendon or muscle that is greater than it can withstand. Common mechanisms of injury include: Stress from sudden increase in duration, frequency, or intensity of training.  Direct hit (trauma) to the shoulder.  Aging, erosion of the tendon with normal use.  Bony bump on shoulder (acromial spur). RISK INCREASES WITH:  Contact sports (football, wrestling, boxing).  Throwing sports (baseball, tennis, volleyball).  Weightlifting and bodybuilding.  Heavy labor.  Previous injury to the rotator cuff, including impingement.  Poor shoulder strength and flexibility.  Failure to warm up properly before activity.  Inadequate protective equipment.  Old age.  Bony bump on shoulder (acromial spur). PREVENTION   Warm up and stretch properly before activity.  Allow for adequate recovery between workouts.  Maintain physical fitness:  Strength, flexibility, and endurance.  Cardiovascular fitness.  Learn and use proper exercise technique. PROGNOSIS  If treated properly, impingement syndrome usually goes away within 6 weeks. Sometimes surgery is required.  RELATED COMPLICATIONS   Longer healing time if not properly treated, or if not given enough time to heal.  Recurring symptoms, that result in a chronic condition.  Shoulder stiffness, frozen shoulder, or loss of motion.  Rotator cuff tendon tear.  Recurring symptoms, especially if activity is resumed too soon, with overuse, with a direct blow, or when using poor technique. TREATMENT  Treatment first involves the use of ice and medicine, to reduce pain and inflammation. The use of strengthening and stretching exercises may help reduce pain with activity. These exercises may be performed at home or with a therapist. If non-surgical treatment is unsuccessful after more than 6 months, surgery may be  advised. After surgery and rehabilitation, activity is usually possible in 3 months.  MEDICATION  If pain  medicine is needed, nonsteroidal anti-inflammatory medicines (aspirin and ibuprofen), or other minor pain relievers (acetaminophen), are often advised.  Do not take pain medicine for 7 days before surgery.  Prescription pain relievers may be given, if your caregiver thinks they are needed. Use only as directed and only as much as you need.  Corticosteroid injections may be given by your caregiver. These injections should be reserved for the most serious cases, because they may only be given a certain number of times. HEAT AND COLD  Cold treatment (icing) should be applied for 10 to 15 minutes every 2 to 3 hours for inflammation and pain, and immediately after activity that aggravates your symptoms. Use ice packs or an ice massage.  Heat treatment may be used before performing stretching and strengthening activities prescribed by your caregiver, physical therapist, or athletic trainer. Use a heat pack or a warm water soak. SEEK MEDICAL CARE IF:   Symptoms get worse or do not improve in 4 to 6 weeks, despite treatment.  New, unexplained symptoms develop. (Drugs used in treatment may produce side effects.) EXERCISES  RANGE OF MOTION (ROM) AND STRETCHING EXERCISES - Impingement Syndrome (Rotator Cuff  Tendinitis, Bursitis) These exercises may help you when beginning to rehabilitate your injury. Your symptoms may go away with or without further involvement from your physician, physical therapist or athletic trainer. While completing these exercises, remember:   Restoring tissue flexibility helps normal motion to return to the joints. This allows healthier, less painful movement and activity.  An effective stretch should be held for at least 30 seconds.  A stretch should never be painful. You should only feel a gentle lengthening or release in the stretched tissue. STRETCH - Flexion, Standing  Stand with good posture. With an underhand grip on your right / left hand, and an overhand grip on  the opposite hand, grasp a broomstick or cane so that your hands are a little more than shoulder width apart.  Keeping your right / left elbow straight and shoulder muscles relaxed, push the stick with your opposite hand, to raise your right / left arm in front of your body and then overhead. Raise your arm until you feel a stretch in your right / left shoulder, but before you have increased shoulder pain.  Try to avoid shrugging your right / left shoulder as your arm rises, by keeping your shoulder blade tucked down and toward your mid-back spine. Hold for __________ seconds.  Slowly return to the starting position. Repeat __________ times. Complete this exercise __________ times per day. STRETCH - Abduction, Supine  Lie on your back. With an underhand grip on your right / left hand and an overhand grip on the opposite hand, grasp a broomstick or cane so that your hands are a little more than shoulder width apart.  Keeping your right / left elbow straight and your shoulder muscles relaxed, push the stick with your opposite hand, to raise your right / left arm out to the side of your body and then overhead. Raise your arm until you feel a stretch in your right / left shoulder, but before you have increased shoulder pain.  Try to avoid shrugging your right / left shoulder as your arm rises, by keeping your shoulder blade tucked down and toward your mid-back spine. Hold for __________ seconds.  Slowly return to the starting position. Repeat __________ times. Complete this exercise  __________ times per day. ROM - Flexion, Active-Assisted  Lie on your back. You may bend your knees for comfort.  Grasp a broomstick or cane so your hands are about shoulder width apart. Your right / left hand should grip the end of the stick, so that your hand is positioned "thumbs-up," as if you were about to shake hands.  Using your healthy arm to lead, raise your right / left arm overhead, until you feel a gentle  stretch in your shoulder. Hold for __________ seconds.  Use the stick to assist in returning your right / left arm to its starting position. Repeat __________ times. Complete this exercise __________ times per day.  ROM - Internal Rotation, Supine   Lie on your back on a firm surface. Place your right / left elbow about 60 degrees away from your side. Elevate your elbow with a folded towel, so that the elbow and shoulder are the same height.  Using a broomstick or cane and your strong arm, pull your right / left hand toward your body until you feel a gentle stretch, but no increase in your shoulder pain. Keep your shoulder and elbow in place throughout the exercise.  Hold for __________ seconds. Slowly return to the starting position. Repeat __________ times. Complete this exercise __________ times per day. STRETCH - Internal Rotation  Place your right / left hand behind your back, palm up.  Throw a towel or belt over your opposite shoulder. Grasp the towel with your right / left hand.  While keeping an upright posture, gently pull up on the towel, until you feel a stretch in the front of your right / left shoulder.  Avoid shrugging your right / left shoulder as your arm rises, by keeping your shoulder blade tucked down and toward your mid-back spine.  Hold for __________ seconds. Release the stretch, by lowering your healthy hand. Repeat __________ times. Complete this exercise __________ times per day. ROM - Internal Rotation   Using an underhand grip, grasp a stick behind your back with both hands.  While standing upright with good posture, slide the stick up your back until you feel a mild stretch in the front of your shoulder.  Hold for __________ seconds. Slowly return to your starting position. Repeat __________ times. Complete this exercise __________ times per day.  STRETCH - Posterior Shoulder Capsule   Stand or sit with good posture. Grasp your right / left elbow and draw  it across your chest, keeping it at the same height as your shoulder.  Pull your elbow, so your upper arm comes in closer to your chest. Pull until you feel a gentle stretch in the back of your shoulder.  Hold for __________ seconds. Repeat __________ times. Complete this exercise __________ times per day. STRENGTHENING EXERCISES - Impingement Syndrome (Rotator Cuff Tendinitis, Bursitis) These exercises may help you when beginning to rehabilitate your injury. They may resolve your symptoms with or without further involvement from your physician, physical therapist or athletic trainer. While completing these exercises, remember:  Muscles can gain both the endurance and the strength needed for everyday activities through controlled exercises.  Complete these exercises as instructed by your physician, physical therapist or athletic trainer. Increase the resistance and repetitions only as guided.  You may experience muscle soreness or fatigue, but the pain or discomfort you are trying to eliminate should never worsen during these exercises. If this pain does get worse, stop and make sure you are following the directions exactly. If the  pain is still present after adjustments, discontinue the exercise until you can discuss the trouble with your clinician.  During your recovery, avoid activity or exercises which involve actions that place your injured hand or elbow above your head or behind your back or head. These positions stress the tissues which you are trying to heal. STRENGTH - Scapular Depression and Adduction   With good posture, sit on a firm chair. Support your arms in front of you, with pillows, arm rests, or on a table top. Have your elbows in line with the sides of your body.  Gently draw your shoulder blades down and toward your mid-back spine. Gradually increase the tension, without tensing the muscles along the top of your shoulders and the back of your neck.  Hold for __________  seconds. Slowly release the tension and relax your muscles completely before starting the next repetition.  After you have practiced this exercise, remove the arm support and complete the exercise in standing as well as sitting position. Repeat __________ times. Complete this exercise __________ times per day.  STRENGTH - Shoulder Abductors, Isometric  With good posture, stand or sit about 4-6 inches from a wall, with your right / left side facing the wall.  Bend your right / left elbow. Gently press your right / left elbow into the wall. Increase the pressure gradually, until you are pressing as hard as you can, without shrugging your shoulder or increasing any shoulder discomfort.  Hold for __________ seconds.  Release the tension slowly. Relax your shoulder muscles completely before you begin the next repetition. Repeat __________ times. Complete this exercise __________ times per day.  STRENGTH - External Rotators, Isometric  Keep your right / left elbow at your side and bend it 90 degrees.  Step into a door frame so that the outside of your right / left wrist can press against the door frame without your upper arm leaving your side.  Gently press your right / left wrist into the door frame, as if you were trying to swing the back of your hand away from your stomach. Gradually increase the tension, until you are pressing as hard as you can, without shrugging your shoulder or increasing any shoulder discomfort.  Hold for __________ seconds.  Release the tension slowly. Relax your shoulder muscles completely before you begin the next repetition. Repeat __________ times. Complete this exercise __________ times per day.  STRENGTH - Supraspinatus   Stand or sit with good posture. Grasp a __________ weight, or an exercise band or tubing, so that your hand is "thumbs-up," like you are shaking hands.  Slowly lift your right / left arm in a "V" away from your thigh, diagonally into the space  between your side and straight ahead. Lift your hand to shoulder height or as far as you can, without increasing any shoulder pain. At first, many people do not lift their hands above shoulder height.  Avoid shrugging your right / left shoulder as your arm rises, by keeping your shoulder blade tucked down and toward your mid-back spine.  Hold for __________ seconds. Control the descent of your hand, as you slowly return to your starting position. Repeat __________ times. Complete this exercise __________ times per day.  STRENGTH - External Rotators  Secure a rubber exercise band or tubing to a fixed object (table, pole) so that it is at the same height as your right / left elbow when you are standing or sitting on a firm surface.  Stand or sit  so that the secured exercise band is at your uninjured side.  Bend your right / left elbow 90 degrees. Place a folded towel or small pillow under your right / left arm, so that your elbow is a few inches away from your side.  Keeping the tension on the exercise band, pull it away from your body, as if pivoting on your elbow. Be sure to keep your body steady, so that the movement is coming only from your rotating shoulder.  Hold for __________ seconds. Release the tension in a controlled manner, as you return to the starting position. Repeat __________ times. Complete this exercise __________ times per day.  STRENGTH - Internal Rotators   Secure a rubber exercise band or tubing to a fixed object (table, pole) so that it is at the same height as your right / left elbow when you are standing or sitting on a firm surface.  Stand or sit so that the secured exercise band is at your right / left side.  Bend your elbow 90 degrees. Place a folded towel or small pillow under your right / left arm so that your elbow is a few inches away from your side.  Keeping the tension on the exercise band, pull it across your body, toward your stomach. Be sure to keep your  body steady, so that the movement is coming only from your rotating shoulder.  Hold for __________ seconds. Release the tension in a controlled manner, as you return to the starting position. Repeat __________ times. Complete this exercise __________ times per day.  STRENGTH - Scapular Protractors, Standing   Stand arms length away from a wall. Place your hands on the wall, keeping your elbows straight.  Begin by dropping your shoulder blades down and toward your mid-back spine.  To strengthen your protractors, keep your shoulder blades down, but slide them forward on your rib cage. It will feel as if you are lifting the back of your rib cage away from the wall. This is a subtle motion and can be challenging to complete. Ask your caregiver for further instruction, if you are not sure you are doing the exercise correctly.  Hold for __________ seconds. Slowly return to the starting position, resting the muscles completely before starting the next repetition. Repeat __________ times. Complete this exercise __________ times per day. STRENGTH - Scapular Protractors, Supine  Lie on your back on a firm surface. Extend your right / left arm straight into the air while holding a __________ weight in your hand.  Keeping your head and back in place, lift your shoulder off the floor.  Hold for __________ seconds. Slowly return to the starting position, and allow your muscles to relax completely before starting the next repetition. Repeat __________ times. Complete this exercise __________ times per day. STRENGTH - Scapular Protractors, Quadruped  Get onto your hands and knees, with your shoulders directly over your hands (or as close as you can be, comfortably).  Keeping your elbows locked, lift the back of your rib cage up into your shoulder blades, so your mid-back rounds out. Keep your neck muscles relaxed.  Hold this position for __________ seconds. Slowly return to the starting position and  allow your muscles to relax completely before starting the next repetition. Repeat __________ times. Complete this exercise __________ times per day.  STRENGTH - Scapular Retractors  Secure a rubber exercise band or tubing to a fixed object (table, pole), so that it is at the height of your shoulders when you  are either standing, or sitting on a firm armless chair.  With a palm down grip, grasp an end of the band in each hand. Straighten your elbows and lift your hands straight in front of you, at shoulder height. Step back, away from the secured end of the band, until it becomes tense.  Squeezing your shoulder blades together, draw your elbows back toward your sides, as you bend them. Keep your upper arms lifted away from your body throughout the exercise.  Hold for __________ seconds. Slowly ease the tension on the band, as you reverse the directions and return to the starting position. Repeat __________ times. Complete this exercise __________ times per day. STRENGTH - Shoulder Extensors   Secure a rubber exercise band or tubing to a fixed object (table, pole) so that it is at the height of your shoulders when you are either standing, or sitting on a firm armless chair.  With a thumbs-up grip, grasp an end of the band in each hand. Straighten your elbows and lift your hands straight in front of you, at shoulder height. Step back, away from the secured end of the band, until it becomes tense.  Squeezing your shoulder blades together, pull your hands down to the sides of your thighs. Do not allow your hands to go behind you.  Hold for __________ seconds. Slowly ease the tension on the band, as you reverse the directions and return to the starting position. Repeat __________ times. Complete this exercise __________ times per day.  STRENGTH - Scapular Retractors and External Rotators   Secure a rubber exercise band or tubing to a fixed object (table, pole) so that it is at the height as your  shoulders, when you are either standing, or sitting on a firm armless chair.  With a palm down grip, grasp an end of the band in each hand. Bend your elbows 90 degrees and lift your elbows to shoulder height, at your sides. Step back, away from the secured end of the band, until it becomes tense.  Squeezing your shoulder blades together, rotate your shoulders so that your upper arms and elbows remain stationary, but your fists travel upward to head height.  Hold for __________ seconds. Slowly ease the tension on the band, as you reverse the directions and return to the starting position. Repeat __________ times. Complete this exercise __________ times per day.  STRENGTH - Scapular Retractors and External Rotators, Rowing   Secure a rubber exercise band or tubing to a fixed object (table, pole) so that it is at the height of your shoulders, when you are either standing, or sitting on a firm armless chair.  With a palm down grip, grasp an end of the band in each hand. Straighten your elbows and lift your hands straight in front of you, at shoulder height. Step back, away from the secured end of the band, until it becomes tense.  Step 1: Squeeze your shoulder blades together. Bending your elbows, draw your hands to your chest, as if you are rowing a boat. At the end of this motion, your hands and elbow should be at shoulder height and your elbows should be out to your sides.  Step 2: Rotate your shoulders, to raise your hands above your head. Your forearms should be vertical and your upper arms should be horizontal.  Hold for __________ seconds. Slowly ease the tension on the band, as you reverse the directions and return to the starting position. Repeat __________ times. Complete this exercise __________  times per day.  STRENGTH - Scapular Depressors  Find a sturdy chair without wheels, such as a dining room chair.  Keeping your feet on the floor, and your hands on the chair arms, lift your  bottom up from the seat, and lock your elbows.  Keeping your elbows straight, allow gravity to pull your body weight down. Your shoulders will rise toward your ears.  Raise your body against gravity by drawing your shoulder blades down your back, shortening the distance between your shoulders and ears. Although your feet should always maintain contact with the floor, your feet should progressively support less body weight, as you get stronger.  Hold for __________ seconds. In a controlled and slow manner, lower your body weight to begin the next repetition. Repeat __________ times. Complete this exercise __________ times per day.  Document Released: 08/02/2005 Document Revised: 10/25/2011 Document Reviewed: 11/14/2008 [facility] Patient Information 2015 [place], [place]. This information is not intended to replace advice given to you by your health care provider. Make sure you discuss any questions you have with your health care provider.

## 2014-07-15 NOTE — Progress Notes (Signed)
Subjective:    Patient ID: Gregory Jensen, male    DOB: September 28, 1955, 58 y.o.   MRN: [id number]  HPI  58 year old patient who has type 2 diabetes and dyslipidemia. He has recently resumed smoking after 4 months of abstinence. Doing quite well today except for some right shoulder discomfort with activity.  Lipid profile was reviewed from 3 months ago.  Hemoglobin A1c on metformin therapy alone was 6.0.  He generally feels well  No past medical history on file.  History   Social History  . Marital Status: Divorced    Spouse Name: N/A    Number of Children: N/A  . Years of Education: N/A   Occupational History  . [employer]    Social History Main Topics  . Smoking status: Current Every Day Smoker -- 1.00 packs/day for 40 years    Types: Cigarettes  . Smokeless tobacco: Never Used  . Alcohol Use: No  . Drug Use: No  . Sexual Activity: Not on file   Other Topics Concern  . Not on file   Social History Narrative    No past surgical history on file.  Family History  Problem Relation Age of Onset  . Diabetes      family hx  . Hypertension Sister   . Heart disease Sister   . Heart disease Father   . Alzheimer's disease Mother   . Hypertension Sister     No Known Allergies  Current Outpatient Prescriptions on File Prior to Visit  Medication Sig Dispense Refill  . aspirin 81 MG EC tablet Take 81 mg by mouth daily.      Marland Kitchen atorvastatin (LIPITOR) 40 MG tablet Take 1 tablet (40 mg total) by mouth daily. 90 tablet 3  . fenofibrate 160 MG tablet Take 1 tablet (160 mg total) by mouth daily. 90 tablet 3  . glucose blood (ONETOUCH VERIO) test strip 1 each by Other route daily as needed for other. Use as instructed 100 each 4  . metFORMIN (GLUMETZA) 500 MG (MOD) 24 hr tablet Take 2 tablets (1,000 mg total) by mouth daily with breakfast. 180 tablet 4  . ONETOUCH DELICA LANCETS 40H MISC 1 each by Does not apply route daily as needed. 100 each 4   No current  facility-administered medications on file prior to visit.    BP 128/72 mmHg  Pulse 76  Temp(Src) 98 F (36.7 C) (Oral)  Resp 20  Ht 5' 8.5" (1.74 m)  Wt 190 lb (86.183 kg)  BMI 28.47 kg/m2  SpO2 97%      Review of Systems  Constitutional: Negative for fever, chills, appetite change and fatigue.  HENT: Negative for congestion, dental problem, ear pain, hearing loss, sore throat, tinnitus, trouble swallowing and voice change.   Eyes: Negative for pain, discharge and visual disturbance.  Respiratory: Negative for cough, chest tightness, wheezing and stridor.   Cardiovascular: Negative for chest pain, palpitations and leg swelling.  Gastrointestinal: Negative for nausea, vomiting, abdominal pain, diarrhea, constipation, blood in stool and abdominal distention.  Genitourinary: Negative for urgency, hematuria, flank pain, discharge, difficulty urinating and genital sores.  Musculoskeletal: Negative for myalgias, back pain, joint swelling, arthralgias, gait problem and neck stiffness.       Right shoulder pain  Skin: Negative for rash.  Neurological: Negative for dizziness, syncope, speech difficulty, weakness, numbness and headaches.  Hematological: Negative for adenopathy. Does not bruise/bleed easily.  Psychiatric/Behavioral: Negative for behavioral problems and dysphoric mood. The patient is not nervous/anxious.  Objective:   Physical Exam  Constitutional: He is oriented to person, place, and time. He appears well-developed.  HENT:  Head: Normocephalic.  Right Ear: External ear normal.  Left Ear: External ear normal.  Eyes: Conjunctivae and EOM are normal.  Neck: Normal range of motion.  Cardiovascular: Normal rate and normal heart sounds.   Pulmonary/Chest: Breath sounds normal.  Abdominal: Bowel sounds are normal.  Musculoskeletal: Normal range of motion. He exhibits no edema or tenderness.  Neurological: He is alert and oriented to person, place, and time.    Psychiatric: He has a normal mood and affect. His behavior is normal.          Assessment & Plan:   DM2 controlled.  Well-controlled on metformin therapy HLD- stable and excellent control R  Shoulder pain- probable rotator cuff tendinopathy.  Will provide the rehabilitation exercises and stretching maneuvers

## 2014-12-15 ENCOUNTER — Other Ambulatory Visit: Payer: Self-pay | Admitting: Internal Medicine

## 2014-12-16 ENCOUNTER — Ambulatory Visit: Payer: BC Managed Care – PPO | Admitting: Internal Medicine

## 2014-12-19 ENCOUNTER — Telehealth: Payer: Self-pay

## 2014-12-19 MED ORDER — FENOFIBRATE 160 MG PO TABS: 160.0000 mg | ORAL_TABLET | Freq: Every day | ORAL | Status: DC

## 2014-12-19 NOTE — Telephone Encounter (Signed)
[facility] request for fenofibrate 160 MG tablet

## 2014-12-19 NOTE — Telephone Encounter (Signed)
Rx sent 

## 2014-12-23 ENCOUNTER — Ambulatory Visit (INDEPENDENT_AMBULATORY_CARE_PROVIDER_SITE_OTHER): Payer: 59 | Admitting: Internal Medicine

## 2014-12-23 ENCOUNTER — Encounter: Payer: Self-pay | Admitting: Internal Medicine

## 2014-12-23 VITALS — BP 130/76 | HR 70 | Temp 98.2°F | Resp 20 | Ht 68.25 in | Wt 186.0 lb

## 2014-12-23 DIAGNOSIS — C61 Malignant neoplasm of prostate: Secondary | ICD-10-CM

## 2014-12-23 DIAGNOSIS — E119 Type 2 diabetes mellitus without complications: Secondary | ICD-10-CM | POA: Diagnosis not present

## 2014-12-23 DIAGNOSIS — Z0000 Encounter for general adult medical examination without abnormal findings: Secondary | ICD-10-CM | POA: Diagnosis not present

## 2014-12-23 DIAGNOSIS — M25511 Pain in right shoulder: Secondary | ICD-10-CM

## 2014-12-23 LAB — CBC WITH DIFFERENTIAL/PLATELET
Basophils Absolute: 0.1 10*3/uL (ref 0.0–0.1)
Basophils Relative: 0.8 % (ref 0.0–3.0)
Eosinophils Absolute: 0.1 10*3/uL (ref 0.0–0.7)
Eosinophils Relative: 0.9 % (ref 0.0–5.0)
HCT: 42.2 % (ref 39.0–52.0)
Hemoglobin: 14.8 g/dL (ref 13.0–17.0)
Lymphocytes Relative: 23.9 % (ref 12.0–46.0)
Lymphs Abs: 1.8 10*3/uL (ref 0.7–4.0)
MCHC: 35 g/dL (ref 30.0–36.0)
MCV: 90.6 fl (ref 78.0–100.0)
Monocytes Absolute: 0.4 10*3/uL (ref 0.1–1.0)
Monocytes Relative: 5.3 % (ref 3.0–12.0)
Neutro Abs: 5.2 10*3/uL (ref 1.4–7.7)
Neutrophils Relative %: 69.1 % (ref 43.0–77.0)
Platelets: 184 10*3/uL (ref 150.0–400.0)
RBC: 4.66 Mil/uL (ref 4.22–5.81)
RDW: 12.5 % (ref 11.5–15.5)
WBC: 7.5 10*3/uL (ref 4.0–10.5)

## 2014-12-23 LAB — COMPREHENSIVE METABOLIC PANEL
ALT: 22 U/L (ref 0–53)
AST: 20 U/L (ref 0–37)
Albumin: 4.3 g/dL (ref 3.5–5.2)
Alkaline Phosphatase: 57 U/L (ref 39–117)
BUN: 15 mg/dL (ref 6–23)
CO2: 27 mEq/L (ref 19–32)
Calcium: 9.5 mg/dL (ref 8.4–10.5)
Chloride: 105 mEq/L (ref 96–112)
Creatinine, Ser: 0.93 mg/dL (ref 0.40–1.50)
GFR: 88.15 mL/min (ref >60.00)
Glucose, Bld: 119 mg/dL — ABNORMAL HIGH (ref 70–99)
Potassium: 3.8 mEq/L (ref 3.5–5.1)
Sodium: 139 mEq/L (ref 135–145)
Total Bilirubin: 0.4 mg/dL (ref 0.2–1.2)
Total Protein: 6.9 g/dL (ref 6.0–8.3)

## 2014-12-23 LAB — LIPID PANEL
Cholesterol: 91 mg/dL (ref 0–200)
HDL: 21.2 mg/dL — ABNORMAL LOW (ref >39.00)
NonHDL: 69.8
Total CHOL/HDL Ratio: 4
Triglycerides: 208 mg/dL — ABNORMAL HIGH (ref 0.0–149.0)
VLDL: 41.6 mg/dL — ABNORMAL HIGH (ref 0.0–40.0)

## 2014-12-23 LAB — PSA: PSA: 0 ng/mL — ABNORMAL LOW (ref 0.10–4.00)

## 2014-12-23 LAB — MICROALBUMIN / CREATININE URINE RATIO
Creatinine,U: 169.2 mg/dL
Microalb Creat Ratio: 1.5 mg/g (ref 0.0–30.0)
Microalb, Ur: 2.6 mg/dL — ABNORMAL HIGH (ref 0.0–1.9)

## 2014-12-23 LAB — LDL CHOLESTEROL, DIRECT: Direct LDL: 39 mg/dL

## 2014-12-23 LAB — TSH: TSH: 1.91 u[IU]/mL (ref 0.35–4.50)

## 2014-12-23 LAB — HEMOGLOBIN A1C: Hgb A1c MFr Bld: 6.2 % (ref 4.6–6.5)

## 2014-12-23 NOTE — Progress Notes (Signed)
Pre visit review using our clinic review tool, if applicable. No additional management support is needed unless otherwise documented below in the visit note. 

## 2014-12-23 NOTE — Patient Instructions (Addendum)
Limit your sodium (Salt) intake    It is important that you exercise regularly, at least 20 minutes 3 to 4 times per week.  If you develop chest pain or shortness of breath seek  medical attention.  Return in 6 months for follow-up  Smoking tobacco is very bad for your health. You should stop smoking immediately.    Health Maintenance A healthy lifestyle and preventative care can promote health and wellness.  Maintain regular health, dental, and eye exams.  Eat a healthy diet. Foods like vegetables, fruits, whole grains, low-fat dairy products, and lean protein foods contain the nutrients you need and are low in calories. Decrease your intake of foods high in solid fats, added sugars, and salt. Get information about a proper diet from your health care provider, if necessary.  Regular physical exercise is one of the most important things you can do for your health. Most adults should get at least 150 minutes of moderate-intensity exercise (any activity that increases your heart rate and causes you to sweat) each week. In addition, most adults need muscle-strengthening exercises on 2 or more days a week.   Maintain a healthy weight. The body mass index (BMI) is a screening tool to identify possible weight problems. It provides an estimate of body fat based on height and weight. Your health care provider can find your BMI and can help you achieve or maintain a healthy weight. For males 20 years and older:  A BMI below 18.5 is considered underweight.  A BMI of 18.5 to 24.9 is normal.  A BMI of 25 to 29.9 is considered overweight.  A BMI of 30 and above is considered obese.  Maintain normal blood lipids and cholesterol by exercising and minimizing your intake of saturated fat. Eat a balanced diet with plenty of fruits and vegetables. Blood tests for lipids and cholesterol should begin at age 63 and be repeated every 5 years. If your lipid or cholesterol levels are high, you are over age 84,  or you are at high risk for heart disease, you may need your cholesterol levels checked more frequently.Ongoing high lipid and cholesterol levels should be treated with medicines if diet and exercise are not working.  If you smoke, find out from your health care provider how to quit. If you do not use tobacco, do not start.  Lung cancer screening is recommended for adults aged 51-80 years who are at high risk for developing lung cancer because of a history of smoking. A yearly low-dose CT scan of the lungs is recommended for people who have at least a 30-pack-year history of smoking and are current smokers or have quit within the past 15 years. A pack year of smoking is smoking an average of 1 pack of cigarettes a day for 1 year (for example, a 30-pack-year history of smoking could mean smoking 1 pack a day for 30 years or 2 packs a day for 15 years). Yearly screening should continue until the smoker has stopped smoking for at least 15 years. Yearly screening should be stopped for people who develop a health problem that would prevent them from having lung cancer treatment.  If you choose to drink alcohol, do not have more than 2 drinks per day. One drink is considered to be 12 oz (360 mL) of beer, 5 oz (150 mL) of wine, or 1.5 oz (45 mL) of liquor.  Avoid the use of street drugs. Do not share needles with anyone. Ask for help if you  need support or instructions about stopping the use of drugs.  High blood pressure causes heart disease and increases the risk of stroke. Blood pressure should be checked at least every 1-2 years. Ongoing high blood pressure should be treated with medicines if weight loss and exercise are not effective.  If you are 63-42 years old, ask your health care provider if you should take aspirin to prevent heart disease.  Diabetes screening involves taking a blood sample to check your fasting blood sugar level. This should be done once every 3 years after age 54 if you are at a  normal weight and without risk factors for diabetes. Testing should be considered at a younger age or be carried out more frequently if you are overweight and have at least 1 risk factor for diabetes.  Colorectal cancer can be detected and often prevented. Most routine colorectal cancer screening begins at the age of 48 and continues through age 64. However, your health care provider may recommend screening at an earlier age if you have risk factors for colon cancer. On a yearly basis, your health care provider may provide home test kits to check for hidden blood in the stool. A small camera at the end of a tube may be used to directly examine the colon (sigmoidoscopy or colonoscopy) to detect the earliest forms of colorectal cancer. Talk to your health care provider about this at age 68 when routine screening begins. A direct exam of the colon should be repeated every 5-10 years through age 72, unless early forms of precancerous polyps or small growths are found.  People who are at an increased risk for hepatitis B should be screened for this virus. You are considered at high risk for hepatitis B if:  You were born in a country where hepatitis B occurs often. Talk with your health care provider about which countries are considered high risk.  Your parents were born in a high-risk country and you have not received a shot to protect against hepatitis B (hepatitis B vaccine).  You have HIV or AIDS.  You use needles to inject street drugs.  You live with, or have sex with, someone who has hepatitis B.  You are a man who has sex with other men (MSM).  You get hemodialysis treatment.  You take certain medicines for conditions like cancer, organ transplantation, and autoimmune conditions.  Hepatitis C blood testing is recommended for all people born from [birth year] through 1965 and any individual with known risk factors for hepatitis C.  Healthy men should no longer receive prostate-specific antigen  (PSA) blood tests as part of routine cancer screening. Talk to your health care provider about prostate cancer screening.  Testicular cancer screening is not recommended for adolescents or adult males who have no symptoms. Screening includes self-exam, a health care provider exam, and other screening tests. Consult with your health care provider about any symptoms you have or any concerns you have about testicular cancer.  Practice safe sex. Use condoms and avoid high-risk sexual practices to reduce the spread of sexually transmitted infections (STIs).  You should be screened for STIs, including gonorrhea and chlamydia if:  You are sexually active and are younger than 24 years.  You are older than 24 years, and your health care provider tells you that you are at risk for this type of infection.  Your sexual activity has changed since you were last screened, and you are at an increased risk for chlamydia or gonorrhea. Ask your health  care provider if you are at risk.  If you are at risk of being infected with HIV, it is recommended that you take a prescription medicine daily to prevent HIV infection. This is called pre-exposure prophylaxis (PrEP). You are considered at risk if:  You are a man who has sex with other men (MSM).  You are a heterosexual man who is sexually active with multiple partners.  You take drugs by injection.  You are sexually active with a partner who has HIV.  Talk with your health care provider about whether you are at high risk of being infected with HIV. If you choose to begin PrEP, you should first be tested for HIV. You should then be tested every 3 months for as long as you are taking PrEP.  Use sunscreen. Apply sunscreen liberally and repeatedly throughout the day. You should seek shade when your shadow is shorter than you. Protect yourself by wearing long sleeves, pants, a wide-brimmed hat, and sunglasses year round whenever you are outdoors.  Tell your health  care provider of new moles or changes in moles, especially if there is a change in shape or color. Also, tell your health care provider if a mole is larger than the size of a pencil eraser.  A one-time screening for abdominal aortic aneurysm (AAA) and surgical repair of large AAAs by ultrasound is recommended for men aged 18-75 years who are current or former smokers.  Stay current with your vaccines (immunizations). Document Released: 01/29/2008 Document Revised: 08/07/2013 Document Reviewed: 12/28/2010 [facility] Patient Information 2015 [place], [place]. This information is not intended to replace advice given to you by your health care provider. Make sure you discuss any questions you have with your health care provider. Cardiac Diet A cardiac diet can help stop heart disease or a stroke from happening. It involves eating less unhealthy fats and eating more healthy fats.  FOODS TO AVOID OR LIMIT  Limit saturated fats. This type of fat is found in oils and dairy products, such as:  Coconut oil.  Palm oil.  Cocoa butter.  Butter.  Avoid trans-fat or hydrogenated oils. These are found in fried or pre-made baked goods, such as:  Margarine.  Pre-made cookies, cakes, and crackers.  Limit processed meats (hot dogs, deli meats, sausage) to 3 ounces a week.  Limit high-fat meats (marbled meats, fried chicken, or chicken with skin) to 3 ounces a week.  Limit salt (sodium) to 1500 milligrams a day.   Limit sweets and drinks with added sugar to no more than 5 servings a week. One serving is:  1 tablespoon of sugar.  1 tablespoon of jelly or jam.   cup sorbet.  1 cup lemonade.   cup regular soda. EAT MORE OF THE FOLLOWING FOODS Fruit  Eat 4to 5 servings a day. One serving of fruit is:  1 medium whole fruit.   cup dried fruit.   cup of fresh, frozen, or canned fruit.   cup 100% fruit juice. Vegetables  Eat 4 to 5 servings a day. One serving is:  1 cup raw leafy  vegetables.   cup raw or cooked, cut-up vegetables.   cup vegetable juice. Whole Grains  Eat 3 servings a day (1 ounce equals 1 serving). Legumes (such as beans, peas, and lentils)   Eat at least 4 servings a week ( cup equals 1 serving). Nuts and Seeds   Eat at least 4 servings a week ( cup equals 1 serving). Dietary Fiber  Eat 20 to  30 grams a day. Some foods high in dietary fiber include:  Dried beans.  Citrus fruits.  Apples, bananas.  Broccoli, Brussels sprouts, and eggplant.  Oats. Omega-3 Fats  Eat food with omega-3 fats. You can also take a dietary pill (supplement) that has 1 gram of DHA and EPA. Have 3.5 ounces of fatty fish a week, such as:  Salmon.  Mackerel.  [place] tuna.  Sardines.  Lake trout.  Herring. PREPARING YOUR FOOD  Broil, bake, steam, or roast foods. Do not fry food. Do not cook food in butter (fat).  Use non-stick cooking sprays.  Remove skin from poultry, such as chicken and [place].  Remove fat from meat.  Take the fat off the top of stews, soups, and gravy.  Use lemon or herbs to flavor food instead of using butter or margarine.  Use nonfat yogurt, salsa, or low-fat dressings for salads. Document Released: 02/01/2012 Document Reviewed: 02/01/2012 [facility] Patient Information 2015 [place]. This information is not intended to replace advice given to you by your health care provider. Make sure you discuss any questions you have with your health care provider.

## 2014-12-23 NOTE — Progress Notes (Signed)
Subjective:    Patient ID: Gregory Jensen, male    DOB: 1955/08/29, 59 y.o.   MRN: [id number]  HPI  Subjective:    Patient ID: Gregory Jensen, male    DOB: Aug 17, 1955, 59 y.o.   MRN: [id number]  HPI 59 year-old patient is seen today for a preventive health examination.  He has history of metabolic syndrome and continues to smoke. He has a strong family history of coronary artery disease with a father brother and sister status post CABG. His father and sister have also had aortic valvular stenosis. He is sedentary with suboptimal dietary habits. He is followed closely by urology due to prostate cancer and did have a screening colonoscopy at age 19 or 54   Results for orders placed or performed in visit on 04/15/14  Lipid panel  Result Value Ref Range   Cholesterol 91 0 - 200 mg/dL   Triglycerides 105.0 0.0 - 149.0 mg/dL   HDL 21.40 (L) >39.00 mg/dL   VLDL 21.0 0.0 - 40.0 mg/dL   LDL Cholesterol 49 0 - 99 mg/dL   Total CHOL/HDL Ratio 4    NonHDL 69.60   Hemoglobin A1c  Result Value Ref Range   Hgb A1c MFr Bld 6.0 4.6 - 6.5 %    History   Social History  . Marital Status: Divorced    Spouse Name: N/A    Number of Children: N/A  . Years of Education: N/A   Occupational History  . [employer]    Social History Main Topics  . Smoking status: Current Everyday Smoker -- 1.5 packs/day for 40 years    Types: Cigarettes  . Smokeless tobacco: Never Used  . Alcohol Use: Yes  . Drug Use: No  . Sexually Active: Not on file   Other Topics Concern  . Not on file   Social History Narrative  . No narrative on file    No past surgical history on file.  Family History  Problem Relation Age of Onset  . Diabetes      family hx  . Hypertension Sister   . Heart disease Sister   . Heart disease Father   . Alzheimer's disease Mother   . Hypertension Sister     No Known Allergies  Current Outpatient Prescriptions on File Prior to Visit  Medication Sig Dispense  Refill  . aspirin 81 MG EC tablet Take 81 mg by mouth daily.        . Cholecalciferol (VITAMIN D3) 1000 UNITS capsule Take 1,000 Units by mouth daily.        . Omega-3 Fatty Acids (FISH OIL) 1200 MG CAPS Take 1,200 mg by mouth daily. 2 caps once daily       . simvastatin (ZOCOR) 40 MG tablet Take 1 tablet (40 mg total) by mouth at bedtime.  30 tablet  11  . fenofibrate 160 MG tablet Take 1 tablet (160 mg total) by mouth daily.  90 tablet  3    BP 130/80  Pulse 78  Temp(Src) 98.4 F (36.9 C) (Oral)  Resp 18  Ht 5' 8.75" (1.746 m)  Wt 207 lb (93.895 kg)  BMI 30.79 kg/m2  SpO2 98%       Review of Systems  Constitutional: Negative for fever, chills, activity change, appetite change and fatigue.  HENT: Negative for hearing loss, ear pain, congestion, rhinorrhea, sneezing, mouth sores, trouble swallowing, neck pain, neck stiffness, dental problem, voice change, sinus pressure and tinnitus.   Eyes: Negative for photophobia,  pain, redness and visual disturbance.  Respiratory: Negative for apnea, cough, choking, chest tightness, shortness of breath and wheezing.   Cardiovascular: Negative for chest pain, palpitations and leg swelling.  Gastrointestinal: Negative for nausea, vomiting, abdominal pain, diarrhea, constipation, blood in stool, abdominal distention, anal bleeding and rectal pain.  Genitourinary: Negative for dysuria, urgency, frequency, hematuria, flank pain, decreased urine volume, discharge, penile swelling, scrotal swelling, difficulty urinating, genital sores and testicular pain.  Musculoskeletal: Negative for myalgias, back pain, joint swelling, arthralgias and gait problem.  Skin: Negative for color change, rash and wound.  Neurological: Negative for dizziness, tremors, seizures, syncope, facial asymmetry, speech difficulty, weakness, light-headedness, numbness (slight numbness of both feet) and headaches.  Hematological: Negative for adenopathy. Does not bruise/bleed  easily.  Psychiatric/Behavioral: Negative for suicidal ideas, hallucinations, behavioral problems, confusion, sleep disturbance, self-injury, dysphoric mood, decreased concentration and agitation. The patient is not nervous/anxious.        Objective:   Physical Exam  Constitutional: He appears well-developed and well-nourished.  HENT:  Head: Normocephalic and atraumatic.  Right Ear: External ear normal.  Left Ear: External ear normal.  Nose: Nose normal.  Mouth/Throat: Oropharynx is clear and moist.  Eyes: Conjunctivae and EOM are normal. Pupils are equal, round, and reactive to light. No scleral icterus.  Neck: Normal range of motion. Neck supple. No JVD present. No thyromegaly present.  Cardiovascular: Regular rhythm,.  Diabetes mellitus type 2 normal heart sounds and intact distal pulses.  Exam reveals no gallop and no friction rub.   No murmur heard. Pulmonary/Chest: Effort normal and breath sounds normal. He exhibits no tenderness.  Abdominal: Soft. Bowel sounds are normal. He exhibits no distension and no mass. There is no tenderness.  Genitourinary: Penis normal.  Musculoskeletal: Normal range of motion. He exhibits no edema and no tenderness.  Lymphadenopathy:    He has no cervical adenopathy.  Neurological: He is alert. He has normal reflexes. No cranial nerve deficit. Coordination normal.  Skin: Skin is warm and dry. No rash noted.  Psychiatric: He has a normal mood and affect. His behavior is normal.          Assessment & Plan:    Metabolic syndrome with mild excess obesity dyslipidemia impaired glucose tolerance and low HDL cholesterol Ongoing tobacco use Strong family history coronary artery disease  We'll continue aggressive risk factor modification.  Total smoking cessation encouraged.  Early last year he was abstinent for about 3 months but has resumed  Wt Readings from Last 3 Encounters:  07/15/14 190 lb (86.183 kg)  07/08/14 187 lb (84.823 kg)  04/15/14  180 lb (81.647 kg)    Review of Systems     see above  Objective:   Physical Exam    As  Above      Assessment & Plan:    Preventive health examination Multiple cardiac risk factors with dyslipidemia impaired glucose tolerance tobacco use and strong family history of coronary artery disease.  Remains asymptomatic but quite sedentary DM2- will continue Metformin Rx Chronic right shoulder pain.  Will set up for orthopedic evaluation  Continue fibrate  and statin therapy Cessation of smoking encouraged Home BS monitoring  Modest weight loss regular exercise regimen encouraged Followup urology Orthopedic evaluation Check screening lab and PSA

## 2014-12-30 ENCOUNTER — Telehealth: Payer: Self-pay | Admitting: *Deleted

## 2014-12-30 NOTE — Telephone Encounter (Signed)
Pt wanting lab results from 5/9, please advise.

## 2014-12-30 NOTE — Telephone Encounter (Signed)
Please call/notify patient that lab/test/procedure is normal  PSA 0 Hemoglobin A1c 6.2 Cholesterol 91

## 2014-12-31 NOTE — Telephone Encounter (Signed)
Spoke to pt, told him labs normal, PSA 0, Hemoglobin A1c was 6.2 and Cholesterol was 91. Pt verbalized understanding.

## 2015-02-18 ENCOUNTER — Other Ambulatory Visit: Payer: Self-pay | Admitting: Internal Medicine

## 2015-03-16 ENCOUNTER — Other Ambulatory Visit: Payer: Self-pay | Admitting: Internal Medicine

## 2015-03-17 NOTE — Telephone Encounter (Signed)
He was wanting to know if Dr. Burnice Logan can do the prescription for a year so he don't have to keep doing this every time he needs his prescription filled.

## 2015-03-17 NOTE — Telephone Encounter (Signed)
Gregory Jensen needs refills for: atorvastatin (LIPITOR) 40 MG tablet, fenofibrate 160 MG tablet, and metFORMIN (GLUCOPHAGE) 500 MG tablet sent to Pharmacy:  [id number] - [place], [place] - [address]

## 2015-03-25 ENCOUNTER — Ambulatory Visit: Payer: 59 | Admitting: Internal Medicine

## 2015-06-14 ENCOUNTER — Other Ambulatory Visit: Payer: Self-pay | Admitting: Internal Medicine

## 2015-06-24 ENCOUNTER — Ambulatory Visit: Payer: 59 | Admitting: Internal Medicine

## 2015-06-25 ENCOUNTER — Ambulatory Visit: Payer: 59 | Admitting: Internal Medicine

## 2015-07-15 ENCOUNTER — Encounter: Payer: Self-pay | Admitting: Internal Medicine

## 2015-07-15 ENCOUNTER — Ambulatory Visit (INDEPENDENT_AMBULATORY_CARE_PROVIDER_SITE_OTHER): Payer: 59 | Admitting: Internal Medicine

## 2015-07-15 VITALS — BP 130/70 | HR 70 | Temp 98.4°F | Resp 20 | Ht 68.25 in | Wt 195.0 lb

## 2015-07-15 DIAGNOSIS — E785 Hyperlipidemia, unspecified: Secondary | ICD-10-CM

## 2015-07-15 DIAGNOSIS — E1165 Type 2 diabetes mellitus with hyperglycemia: Secondary | ICD-10-CM | POA: Insufficient documentation

## 2015-07-15 DIAGNOSIS — E119 Type 2 diabetes mellitus without complications: Secondary | ICD-10-CM

## 2015-07-15 LAB — HEMOGLOBIN A1C: Hgb A1c MFr Bld: 6.7 % — ABNORMAL HIGH (ref 4.6–6.5)

## 2015-07-15 NOTE — Progress Notes (Signed)
Pre visit review using our clinic review tool, if applicable. No additional management support is needed unless otherwise documented below in the visit note. 

## 2015-07-15 NOTE — Patient Instructions (Signed)
Limit your sodium (Salt) intake   Please check your hemoglobin A1c every 3-6 months     It is important that you exercise regularly, at least 20 minutes 3 to 4 times per week.  If you develop chest pain or shortness of breath seek  medical attention.  Please see your eye doctor yearly to check for diabetic eye damage  Smoking tobacco is very bad for your health. You should stop smoking immediately.

## 2015-07-15 NOTE — Progress Notes (Signed)
Subjective:    Patient ID: Gregory Jensen, male    DOB: 1956-06-14, 59 y.o.   MRN: [id number]  HPI  59 year old patient seen today for follow-up of diabetes.  He has dyslipidemia.  Doing quite well.  No cardiopulmonary complaints.  Lab Results  Component Value Date   HGBA1C 6.2 12/23/2014    No past medical history on file.  Social History   Social History  . Marital Status: Divorced    Spouse Name: N/A  . Number of Children: N/A  . Years of Education: N/A   Occupational History  . [employer]    Social History Main Topics  . Smoking status: Current Every Day Smoker -- 1.00 packs/day for 40 years    Types: Cigarettes  . Smokeless tobacco: Never Used  . Alcohol Use: No  . Drug Use: No  . Sexual Activity: Not on file   Other Topics Concern  . Not on file   Social History Narrative    No past surgical history on file.  Family History  Problem Relation Age of Onset  . Diabetes      family hx  . Hypertension Sister   . Heart disease Sister   . Heart disease Father   . Alzheimer's disease Mother   . Hypertension Sister     No Known Allergies  Current Outpatient Prescriptions on File Prior to Visit  Medication Sig Dispense Refill  . aspirin 81 MG EC tablet Take 81 mg by mouth daily.      Marland Kitchen atorvastatin (LIPITOR) 40 MG tablet TAKE 1 TABLET BY MOUTH EVERY DAY 90 tablet 3  . fenofibrate 160 MG tablet TAKE 1 TABLET(160 MG) BY MOUTH DAILY 90 tablet 1  . glucose blood (ONETOUCH VERIO) test strip 1 each by Other route daily as needed for other. Use as instructed 100 each 4  . metFORMIN (GLUCOPHAGE) 500 MG tablet TAKE 1 TABLET BY MOUTH TWICE DAILY WITH A MEAL (Patient taking differently: TAKE 1 TABLET BY MOUTH DAILY WITH A MEAL) 180 tablet 1  . ONETOUCH DELICA LANCETS 14G MISC 1 each by Does not apply route daily as needed. 100 each 4   No current facility-administered medications on file prior to visit.    BP 130/70 mmHg  Pulse 70  Temp(Src) 98.4 F  (36.9 C) (Oral)  Resp 20  Ht 5' 8.25" (1.734 m)  Wt 195 lb (88.451 kg)  BMI 29.42 kg/m2  SpO2 99%     Review of Systems  Constitutional: Negative for fever, chills, appetite change and fatigue.  HENT: Negative for congestion, dental problem, ear pain, hearing loss, sore throat, tinnitus, trouble swallowing and voice change.   Eyes: Negative for pain, discharge and visual disturbance.  Respiratory: Negative for cough, chest tightness, wheezing and stridor.   Cardiovascular: Negative for chest pain, palpitations and leg swelling.  Gastrointestinal: Negative for nausea, vomiting, abdominal pain, diarrhea, constipation, blood in stool and abdominal distention.  Genitourinary: Negative for urgency, hematuria, flank pain, discharge, difficulty urinating and genital sores.  Musculoskeletal: Negative for myalgias, back pain, joint swelling, arthralgias, gait problem and neck stiffness.  Skin: Negative for rash.  Neurological: Negative for dizziness, syncope, speech difficulty, weakness, numbness and headaches.  Hematological: Negative for adenopathy. Does not bruise/bleed easily.  Psychiatric/Behavioral: Negative for behavioral problems and dysphoric mood. The patient is not nervous/anxious.        Objective:   Physical Exam  Constitutional: He is oriented to person, place, and time. He appears well-developed.  HENT:  Head: Normocephalic.  Right Ear: External ear normal.  Left Ear: External ear normal.  Eyes: Conjunctivae and EOM are normal.  Neck: Normal range of motion.  Cardiovascular: Normal rate and normal heart sounds.   Pulmonary/Chest: Breath sounds normal.  Abdominal: Bowel sounds are normal.  Musculoskeletal: Normal range of motion. He exhibits no edema or tenderness.  Neurological: He is alert and oriented to person, place, and time.  Psychiatric: He has a normal mood and affect. His behavior is normal.          Assessment & Plan:   Diabetes mellitus  stable Dyslipidemia Ongoing tobacco use.  Total smoking cessation encouraged History prostate cancer.  PSA 0   Check hemoglobin A1c CPX 6 months

## 2015-12-13 ENCOUNTER — Other Ambulatory Visit: Payer: Self-pay | Admitting: Internal Medicine

## 2015-12-31 ENCOUNTER — Ambulatory Visit (INDEPENDENT_AMBULATORY_CARE_PROVIDER_SITE_OTHER): Payer: BLUE CROSS/BLUE SHIELD | Admitting: Internal Medicine

## 2015-12-31 ENCOUNTER — Encounter: Payer: Self-pay | Admitting: Internal Medicine

## 2015-12-31 VITALS — BP 124/70 | HR 76 | Temp 98.2°F | Resp 20 | Ht 68.25 in | Wt 193.0 lb

## 2015-12-31 DIAGNOSIS — E119 Type 2 diabetes mellitus without complications: Secondary | ICD-10-CM | POA: Diagnosis not present

## 2015-12-31 DIAGNOSIS — Z1211 Encounter for screening for malignant neoplasm of colon: Secondary | ICD-10-CM | POA: Diagnosis not present

## 2015-12-31 DIAGNOSIS — M25552 Pain in left hip: Secondary | ICD-10-CM | POA: Diagnosis not present

## 2015-12-31 MED ORDER — METHYLPREDNISOLONE ACETATE 80 MG/ML IJ SUSP
80.0000 mg | Freq: Once | INTRAMUSCULAR | Status: AC
  Administered 2015-12-31: 80 mg via INTRAMUSCULAR

## 2015-12-31 MED ORDER — TRAMADOL HCL 50 MG PO TABS: 50.0000 mg | ORAL_TABLET | Freq: Four times a day (QID) | ORAL | Status: DC | PRN

## 2015-12-31 NOTE — Addendum Note (Signed)
Addended by: Marian Sorrow on: 12/31/2015 12:09 PM   Modules accepted: Orders

## 2015-12-31 NOTE — Progress Notes (Signed)
Subjective:    Patient ID: Gregory Jensen, male    DOB: Oct 19, 1955, 60 y.o.   MRN: [id number]  HPI  60 year old patient has a history of type 2 diabetes.  He also has a history of prior back surgery for a lumbar radiculopathy.  He presents with a four-day history of pain in the left hip area.  Pain occasionally radiates as far as the knee.  There is been no motor weakness.  Pain is improved with the activity and moving about and seems to intensify at night.  No fever or other constitutional complaints. He does use his wallet in his left back, hip pocket  No past medical history on file.   Social History   Social History  . Marital Status: Divorced    Spouse Name: N/A  . Number of Children: N/A  . Years of Education: N/A   Occupational History  . [employer]    Social History Main Topics  . Smoking status: Current Every Day Smoker -- 1.00 packs/day for 40 years    Types: Cigarettes  . Smokeless tobacco: Never Used  . Alcohol Use: No  . Drug Use: No  . Sexual Activity: Not on file   Other Topics Concern  . Not on file   Social History Narrative    No past surgical history on file.  Family History  Problem Relation Age of Onset  . Diabetes      family hx  . Hypertension Sister   . Heart disease Sister   . Heart disease Father   . Alzheimer's disease Mother   . Hypertension Sister     No Known Allergies  Current Outpatient Prescriptions on File Prior to Visit  Medication Sig Dispense Refill  . aspirin 81 MG EC tablet Take 81 mg by mouth daily.      Marland Kitchen atorvastatin (LIPITOR) 40 MG tablet TAKE 1 TABLET BY MOUTH EVERY DAY 90 tablet 3  . fenofibrate 160 MG tablet TAKE 1 TABLET(160 MG) BY MOUTH DAILY 90 tablet 1  . glucose blood (ONETOUCH VERIO) test strip 1 each by Other route daily as needed for other. Use as instructed 100 each 4  . metFORMIN (GLUCOPHAGE) 500 MG tablet TAKE 1 TABLET BY MOUTH TWICE DAILY WITH A MEAL (Patient taking differently: TAKE 1 TABLET  BY MOUTH DAILY WITH A MEAL) 180 tablet 1  . ONETOUCH DELICA LANCETS 93X MISC 1 each by Does not apply route daily as needed. 100 each 4   No current facility-administered medications on file prior to visit.    BP 124/70 mmHg  Pulse 76  Temp(Src) 98.2 F (36.8 C) (Oral)  Resp 20  Ht 5' 8.25" (1.734 m)  Wt 193 lb (87.544 kg)  BMI 29.12 kg/m2  SpO2 98%     Review of Systems  Constitutional: Negative for fever, chills, appetite change and fatigue.  HENT: Negative for congestion, dental problem, ear pain, hearing loss, sore throat, tinnitus, trouble swallowing and voice change.   Eyes: Negative for pain, discharge and visual disturbance.  Respiratory: Negative for cough, chest tightness, wheezing and stridor.   Cardiovascular: Negative for chest pain, palpitations and leg swelling.  Gastrointestinal: Negative for nausea, vomiting, abdominal pain, diarrhea, constipation, blood in stool and abdominal distention.  Genitourinary: Negative for urgency, hematuria, flank pain, discharge, difficulty urinating and genital sores.  Musculoskeletal: Negative for myalgias, back pain, joint swelling, arthralgias, gait problem and neck stiffness.       Right hip pain  Skin: Negative for rash.  Neurological: Negative for dizziness, syncope, speech difficulty, weakness, numbness and headaches.  Hematological: Negative for adenopathy. Does not bruise/bleed easily.  Psychiatric/Behavioral: Negative for behavioral problems and dysphoric mood. The patient is not nervous/anxious.        Objective:   Physical Exam  Constitutional: He appears well-developed and well-nourished. No distress.  Musculoskeletal:  Negative straight leg test Full range of motion both hips Reflexes symmetrical No motor weakness Able to walk on his toes and heels without difficulty          Assessment & Plan:   Left hip pain.  Lumbar radiculopathy versus hip bursitis.  Will treat with Depo-Medrol 80.  Patient will  moderate his activities and call if any new or worsening symptoms.  He has been asked to not use his left back, hip pocket for his wallet Will consider lumbar MRI.  If unimproved

## 2015-12-31 NOTE — Patient Instructions (Signed)
Call or return to clinic prn if these symptoms worsen or fail to improve as anticipated.  Sciatica Sciatica is pain, weakness, numbness, or tingling along the path of the sciatic nerve. The nerve starts in the lower back and runs down the back of each leg. The nerve controls the muscles in the lower leg and in the back of the knee, while also providing sensation to the back of the thigh, lower leg, and the sole of your foot. Sciatica is a symptom of another medical condition. For instance, nerve damage or certain conditions, such as a herniated disk or bone spur on the spine, pinch or put pressure on the sciatic nerve. This causes the pain, weakness, or other sensations normally associated with sciatica. Generally, sciatica only affects one side of the body. CAUSES   Herniated or slipped disc.  Degenerative disk disease.  A pain disorder involving the narrow muscle in the buttocks (piriformis syndrome).  Pelvic injury or fracture.  Pregnancy.  Tumor (rare). SYMPTOMS  Symptoms can vary from mild to very severe. The symptoms usually travel from the low back to the buttocks and down the back of the leg. Symptoms can include:  Mild tingling or dull aches in the lower back, leg, or hip.  Numbness in the back of the calf or sole of the foot.  Burning sensations in the lower back, leg, or hip.  Sharp pains in the lower back, leg, or hip.  Leg weakness.  Severe back pain inhibiting movement. These symptoms may get worse with coughing, sneezing, laughing, or prolonged sitting or standing. Also, being overweight may worsen symptoms. DIAGNOSIS  Your caregiver will perform a physical exam to look for common symptoms of sciatica. He or she may ask you to do certain movements or activities that would trigger sciatic nerve pain. Other tests may be performed to find the cause of the sciatica. These may include:  Blood tests.  X-rays.  Imaging tests, such as an MRI or CT scan. TREATMENT    Treatment is directed at the cause of the sciatic pain. Sometimes, treatment is not necessary and the pain and discomfort goes away on its own. If treatment is needed, your caregiver may suggest:  Over-the-counter medicines to relieve pain.  Prescription medicines, such as anti-inflammatory medicine, muscle relaxants, or narcotics.  Applying heat or ice to the painful area.  Steroid injections to lessen pain, irritation, and inflammation around the nerve.  Reducing activity during periods of pain.  Exercising and stretching to strengthen your abdomen and improve flexibility of your spine. Your caregiver may suggest losing weight if the extra weight makes the back pain worse.  Physical therapy.  Surgery to eliminate what is pressing or pinching the nerve, such as a bone spur or part of a herniated disk. HOME CARE INSTRUCTIONS   Only take over-the-counter or prescription medicines for pain or discomfort as directed by your caregiver.  Apply ice to the affected area for 20 minutes, 3-4 times a day for the first 48-72 hours. Then try heat in the same way.  Exercise, stretch, or perform your usual activities if these do not aggravate your pain.  Attend physical therapy sessions as directed by your caregiver.  Keep all follow-up appointments as directed by your caregiver.  Do not wear high heels or shoes that do not provide proper support.  Check your mattress to see if it is too soft. A firm mattress may lessen your pain and discomfort. SEEK IMMEDIATE MEDICAL CARE IF:   You lose  control of your bowel or bladder (incontinence).  You have increasing weakness in the lower back, pelvis, buttocks, or legs.  You have redness or swelling of your back.  You have a burning sensation when you urinate.  You have pain that gets worse when you lie down or awakens you at night.  Your pain is worse than you have experienced in the past.  Your pain is lasting longer than 4 weeks.  You  are suddenly losing weight without reason. MAKE SURE YOU:  Understand these instructions.  Will watch your condition.  Will get help right away if you are not doing well or get worse.   This information is not intended to replace advice given to you by your health care provider. Make sure you discuss any questions you have with your health care provider.   Document Released: 07/27/2001 Document Revised: 04/23/2015 Document Reviewed: 12/12/2011 Elsevier Interactive Patient Education [employer].

## 2015-12-31 NOTE — Progress Notes (Signed)
Pre visit review using our clinic review tool, if applicable. No additional management support is needed unless otherwise documented below in the visit note. 

## 2016-01-01 ENCOUNTER — Encounter: Payer: Self-pay | Admitting: Gastroenterology

## 2016-01-07 ENCOUNTER — Other Ambulatory Visit (INDEPENDENT_AMBULATORY_CARE_PROVIDER_SITE_OTHER): Payer: BLUE CROSS/BLUE SHIELD

## 2016-01-07 DIAGNOSIS — R7989 Other specified abnormal findings of blood chemistry: Secondary | ICD-10-CM

## 2016-01-07 DIAGNOSIS — Z113 Encounter for screening for infections with a predominantly sexual mode of transmission: Secondary | ICD-10-CM

## 2016-01-07 DIAGNOSIS — Z0000 Encounter for general adult medical examination without abnormal findings: Secondary | ICD-10-CM

## 2016-01-07 LAB — POC URINALSYSI DIPSTICK (AUTOMATED)
Bilirubin, UA: NEGATIVE
Glucose, UA: NEGATIVE
Ketones, UA: NEGATIVE
Leukocytes, UA: NEGATIVE
Nitrite, UA: NEGATIVE
Spec Grav, UA: >=1.03
Urobilinogen, UA: 0.2
pH, UA: 5

## 2016-01-07 LAB — CBC WITH DIFFERENTIAL/PLATELET
Basophils Absolute: 0.1 10*3/uL (ref 0.0–0.1)
Basophils Relative: 0.5 % (ref 0.0–3.0)
Eosinophils Absolute: 0.2 10*3/uL (ref 0.0–0.7)
Eosinophils Relative: 1.5 % (ref 0.0–5.0)
HCT: 43.6 % (ref 39.0–52.0)
Hemoglobin: 15.3 g/dL (ref 13.0–17.0)
Lymphocytes Relative: 23.2 % (ref 12.0–46.0)
Lymphs Abs: 2.5 10*3/uL (ref 0.7–4.0)
MCHC: 35.1 g/dL (ref 30.0–36.0)
MCV: 92.1 fl (ref 78.0–100.0)
Monocytes Absolute: 0.6 10*3/uL (ref 0.1–1.0)
Monocytes Relative: 5.7 % (ref 3.0–12.0)
Neutro Abs: 7.5 10*3/uL (ref 1.4–7.7)
Neutrophils Relative %: 69.1 % (ref 43.0–77.0)
Platelets: 176 10*3/uL (ref 150.0–400.0)
RBC: 4.74 Mil/uL (ref 4.22–5.81)
RDW: 12.9 % (ref 11.5–15.5)
WBC: 10.9 10*3/uL — ABNORMAL HIGH (ref 4.0–10.5)

## 2016-01-07 LAB — HEPATIC FUNCTION PANEL
ALT: 31 U/L (ref 0–53)
AST: 27 U/L (ref 0–37)
Albumin: 4.9 g/dL (ref 3.5–5.2)
Alkaline Phosphatase: 55 U/L (ref 39–117)
Bilirubin, Direct: 0.1 mg/dL (ref 0.0–0.3)
Total Bilirubin: 0.5 mg/dL (ref 0.2–1.2)
Total Protein: 7.1 g/dL (ref 6.0–8.3)

## 2016-01-07 LAB — HEMOGLOBIN A1C: Hgb A1c MFr Bld: 6.5 % (ref 4.6–6.5)

## 2016-01-07 LAB — BASIC METABOLIC PANEL
BUN: 28 mg/dL — ABNORMAL HIGH (ref 6–23)
CO2: 26 mEq/L (ref 19–32)
Calcium: 10 mg/dL (ref 8.4–10.5)
Chloride: 108 mEq/L (ref 96–112)
Creatinine, Ser: 1.1 mg/dL (ref 0.40–1.50)
GFR: 72.37 mL/min (ref >60.00)
Glucose, Bld: 107 mg/dL — ABNORMAL HIGH (ref 70–99)
Potassium: 3.9 mEq/L (ref 3.5–5.1)
Sodium: 141 mEq/L (ref 135–145)

## 2016-01-07 LAB — PSA: PSA: 0 ng/mL — ABNORMAL LOW (ref 0.10–4.00)

## 2016-01-07 LAB — LIPID PANEL
Cholesterol: 114 mg/dL (ref 0–200)
HDL: 20.4 mg/dL — ABNORMAL LOW (ref >39.00)
NonHDL: 93.74
Total CHOL/HDL Ratio: 6
Triglycerides: 211 mg/dL — ABNORMAL HIGH (ref 0.0–149.0)
VLDL: 42.2 mg/dL — ABNORMAL HIGH (ref 0.0–40.0)

## 2016-01-07 LAB — MICROALBUMIN / CREATININE URINE RATIO
Creatinine,U: 207.7 mg/dL
Microalb Creat Ratio: 1.8 mg/g (ref 0.0–30.0)
Microalb, Ur: 3.7 mg/dL — ABNORMAL HIGH (ref 0.0–1.9)

## 2016-01-07 LAB — TSH: TSH: 3.54 u[IU]/mL (ref 0.35–4.50)

## 2016-01-07 LAB — LDL CHOLESTEROL, DIRECT: Direct LDL: 57 mg/dL

## 2016-01-08 LAB — HIV ANTIBODY (ROUTINE TESTING W REFLEX): HIV 1&2 Ab, 4th Generation: NONREACTIVE

## 2016-01-08 LAB — HEPATITIS C ANTIBODY: HCV Ab: NEGATIVE

## 2016-01-14 ENCOUNTER — Encounter: Payer: Self-pay | Admitting: Internal Medicine

## 2016-01-14 ENCOUNTER — Ambulatory Visit (INDEPENDENT_AMBULATORY_CARE_PROVIDER_SITE_OTHER): Payer: BLUE CROSS/BLUE SHIELD | Admitting: Internal Medicine

## 2016-01-14 VITALS — BP 140/74 | HR 72 | Temp 97.9°F | Ht 69.5 in | Wt 189.0 lb

## 2016-01-14 DIAGNOSIS — Z0000 Encounter for general adult medical examination without abnormal findings: Secondary | ICD-10-CM

## 2016-01-14 DIAGNOSIS — C61 Malignant neoplasm of prostate: Secondary | ICD-10-CM

## 2016-01-14 DIAGNOSIS — E785 Hyperlipidemia, unspecified: Secondary | ICD-10-CM | POA: Diagnosis not present

## 2016-01-14 DIAGNOSIS — E119 Type 2 diabetes mellitus without complications: Secondary | ICD-10-CM

## 2016-01-14 DIAGNOSIS — F17200 Nicotine dependence, unspecified, uncomplicated: Secondary | ICD-10-CM

## 2016-01-14 MED ORDER — VARENICLINE TARTRATE 1 MG PO TABS: 1.0000 mg | ORAL_TABLET | Freq: Two times a day (BID) | ORAL | Status: DC

## 2016-01-14 NOTE — Progress Notes (Signed)
Pre visit review using our clinic review tool, if applicable. No additional management support is needed unless otherwise documented below in the visit note. 

## 2016-01-14 NOTE — Progress Notes (Signed)
Subjective:    Patient ID: Gregory Jensen, male    DOB: March 08, 1956, 60 y.o.   MRN: [id number]  HPI  Subjective:    Patient ID: Gregory Jensen, male    DOB: 07/13/1956, 60 y.o.   MRN: [id number]  HPI 60 year-old patient is seen today for a preventive health examination.  He has history of metabolic syndrome and continues to smoke. He has a strong family history of coronary artery disease with a father brother and sister status post CABG. His father and sister have also had aortic valvular stenosis. He is sedentary with suboptimal dietary habits. He is followed closely by urology due to prostate cancer and did have a screening colonoscopy at age 42 or 27.  He is scheduled for follow-up colonoscopy July 2017   Results for orders placed or performed in visit on 85/27/78  Basic metabolic panel  Result Value Ref Range   Sodium 141 135 - 145 mEq/L   Potassium 3.9 3.5 - 5.1 mEq/L   Chloride 108 96 - 112 mEq/L   CO2 26 19 - 32 mEq/L   Glucose, Bld 107 (H) 70 - 99 mg/dL   BUN 28 (H) 6 - 23 mg/dL   Creatinine, Ser 1.10 0.40 - 1.50 mg/dL   Calcium 10.0 8.4 - 10.5 mg/dL   GFR 72.37 >60.00 mL/min  CBC with Differential/Platelet  Result Value Ref Range   WBC 10.9 (H) 4.0 - 10.5 K/uL   RBC 4.74 4.22 - 5.81 Mil/uL   Hemoglobin 15.3 13.0 - 17.0 g/dL   HCT 43.6 39.0 - 52.0 %   MCV 92.1 78.0 - 100.0 fl   MCHC 35.1 30.0 - 36.0 g/dL   RDW 12.9 11.5 - 15.5 %   Platelets 176.0 150.0 - 400.0 K/uL   Neutrophils Relative % 69.1 43.0 - 77.0 %   Lymphocytes Relative 23.2 12.0 - 46.0 %   Monocytes Relative 5.7 3.0 - 12.0 %   Eosinophils Relative 1.5 0.0 - 5.0 %   Basophils Relative 0.5 0.0 - 3.0 %   Neutro Abs 7.5 1.4 - 7.7 K/uL   Lymphs Abs 2.5 0.7 - 4.0 K/uL   Monocytes Absolute 0.6 0.1 - 1.0 K/uL   Eosinophils Absolute 0.2 0.0 - 0.7 K/uL   Basophils Absolute 0.1 0.0 - 0.1 K/uL  Hepatic function panel  Result Value Ref Range   Total Bilirubin 0.5 0.2 - 1.2 mg/dL   Bilirubin, Direct 0.1  0.0 - 0.3 mg/dL   Alkaline Phosphatase 55 39 - 117 U/L   AST 27 0 - 37 U/L   ALT 31 0 - 53 U/L   Total Protein 7.1 6.0 - 8.3 g/dL   Albumin 4.9 3.5 - 5.2 g/dL  Hepatitis C antibody  Result Value Ref Range   HCV Ab NEGATIVE NEGATIVE  TSH  Result Value Ref Range   TSH 3.54 0.35 - 4.50 uIU/mL  PSA  Result Value Ref Range   PSA 0.00 (L) 0.10 - 4.00 ng/mL  Microalbumin / creatinine urine ratio  Result Value Ref Range   Microalb, Ur 3.7 (H) 0.0 - 1.9 mg/dL   Creatinine,U 207.7 mg/dL   Microalb Creat Ratio 1.8 0.0 - 30.0 mg/g  Lipid panel  Result Value Ref Range   Cholesterol 114 0 - 200 mg/dL   Triglycerides 211.0 (H) 0.0 - 149.0 mg/dL   HDL 20.40 (L) >39.00 mg/dL   VLDL 42.2 (H) 0.0 - 40.0 mg/dL   Total CHOL/HDL Ratio 6    NonHDL 93.74  HIV antibody  Result Value Ref Range   HIV 1&2 Ab, 4th Generation NONREACTIVE NONREACTIVE  Hemoglobin A1c  Result Value Ref Range   Hgb A1c MFr Bld 6.5 4.6 - 6.5 %  LDL cholesterol, direct  Result Value Ref Range   Direct LDL 57.0 mg/dL  POCT Urinalysis Dipstick (Automated)  Result Value Ref Range   Color, UA yellow    Clarity, UA clear    Glucose, UA n    Bilirubin, UA n    Ketones, UA n    Spec Grav, UA >=1.030    Blood, UA trace-lysed    pH, UA 5.0    Protein, UA trace    Urobilinogen, UA 0.2    Nitrite, UA n    Leukocytes, UA Negative Negative    History   Social History  . Marital Status: Divorced    Spouse Name: N/A    Number of Children: N/A  . Years of Education: N/A   Occupational History  . [employer]    Social History Main Topics  . Smoking status: Current Everyday Smoker -- 1.5 packs/day for 40 years    Types: Cigarettes  . Smokeless tobacco: Never Used  . Alcohol Use: Yes  . Drug Use: No  . Sexually Active: Not on file   Other Topics Concern  . Not on file   Social History Narrative  . No narrative on file    No past surgical history on file.  Family History  Problem Relation Age of Onset   . Diabetes      family hx  . Hypertension Sister   . Heart disease Sister   . Heart disease Father   . Alzheimer's disease Mother   . Hypertension Sister     No Known Allergies  Current Outpatient Prescriptions on File Prior to Visit  Medication Sig Dispense Refill  . aspirin 81 MG EC tablet Take 81 mg by mouth daily.        . Cholecalciferol (VITAMIN D3) 1000 UNITS capsule Take 1,000 Units by mouth daily.        . Omega-3 Fatty Acids (FISH OIL) 1200 MG CAPS Take 1,200 mg by mouth daily. 2 caps once daily       . simvastatin (ZOCOR) 40 MG tablet Take 1 tablet (40 mg total) by mouth at bedtime.  30 tablet  11  . fenofibrate 160 MG tablet Take 1 tablet (160 mg total) by mouth daily.  90 tablet  3    BP 130/80  Pulse 78  Temp(Src) 98.4 F (36.9 C) (Oral)  Resp 18  Ht 5' 8.75" (1.746 m)  Wt 207 lb (93.895 kg)  BMI 30.79 kg/m2  SpO2 98%       Review of Systems  Constitutional: Negative for fever, chills, activity change, appetite change and fatigue.  HENT: Negative for hearing loss, ear pain, congestion, rhinorrhea, sneezing, mouth sores, trouble swallowing, neck pain, neck stiffness, dental problem, voice change, sinus pressure and tinnitus.   Eyes: Negative for photophobia, pain, redness and visual disturbance.  Respiratory: Negative for apnea, cough, choking, chest tightness, shortness of breath and wheezing.   Cardiovascular: Negative for chest pain, palpitations and leg swelling.  Gastrointestinal: Negative for nausea, vomiting, abdominal pain, diarrhea, constipation, blood in stool, abdominal distention, anal bleeding and rectal pain.  Genitourinary: Negative for dysuria, urgency, frequency, hematuria, flank pain, decreased urine volume, discharge, penile swelling, scrotal swelling, difficulty urinating, genital sores and testicular pain.  Musculoskeletal: Negative for myalgias, back pain, joint swelling,  arthralgias and gait problem.  Skin: Negative for color change,  rash and wound.  Neurological: Negative for dizziness, tremors, seizures, syncope, facial asymmetry, speech difficulty, weakness, light-headedness, numbness (slight numbness of both feet) and headaches.  Hematological: Negative for adenopathy. Does not bruise/bleed easily.  Psychiatric/Behavioral: Negative for suicidal ideas, hallucinations, behavioral problems, confusion, sleep disturbance, self-injury, dysphoric mood, decreased concentration and agitation. The patient is not nervous/anxious.        Objective:   Physical Exam  Constitutional: He appears well-developed and well-nourished.  HENT:  Head: Normocephalic and atraumatic.  Right Ear: External ear normal.  Left Ear: External ear normal.  Nose: Nose normal.  Mouth/Throat: Oropharynx is clear and moist.  Eyes: Conjunctivae and EOM are normal. Pupils are equal, round, and reactive to light. No scleral icterus.  Neck: Normal range of motion. Neck supple. No JVD present. No thyromegaly present.  Cardiovascular: Regular rhythm,.  Diabetes mellitus type 2 normal heart sounds and intact distal pulses.  Exam reveals no gallop and no friction rub.   No murmur heard. Pulmonary/Chest: Effort normal and breath sounds normal. He exhibits no tenderness.  Abdominal: Soft. Bowel sounds are normal. He exhibits no distension and no mass. There is no tenderness.  Genitourinary: Penis normal.  Musculoskeletal: Normal range of motion. He exhibits no edema and no tenderness.  Lymphadenopathy:    He has no cervical adenopathy.  Neurological: He is alert. He has normal reflexes. No cranial nerve deficit. Coordination normal.  Skin: Skin is warm and dry. No rash noted.  Psychiatric: He has a normal mood and affect. His behavior is normal.          Assessment & Plan:    Metabolic syndrome with mild  obesity dyslipidemia impaired glucose tolerance and low HDL cholesterol Ongoing tobacco use Strong family history coronary artery disease  We'll  continue aggressive risk factor modification.  Total smoking cessation encouraged.  Early last year he was abstinent for about 3 months but has resumed  Wt Readings from Last 3 Encounters:  01/14/16 189 lb (85.73 kg)  12/31/15 193 lb (87.544 kg)  07/15/15 195 lb (88.451 kg)    Review of Systems     see above  Objective:   Physical Exam  Cardiovascular:  Decreased left dorsalis pedis pulse  Genitourinary:  Uncircumcised     As  Above      Assessment & Plan:    Preventive health examination Multiple cardiac risk factors with dyslipidemia impaired glucose tolerance tobacco use and strong family history of coronary artery disease.  Remains asymptomatic but quite sedentary DM2- will continue Metformin Rx   Continue fibrate  and statin therapy Cessation of smoking encouraged.  Patient agreeable to trial of Chantix Home BS monitoring  Modest weight loss regular exercise regimen encouraged Followup urology  Screening lab reviewed Colonoscopy is scheduled Patient will consider low intensity, chest CT scan

## 2016-01-14 NOTE — Patient Instructions (Signed)
Schedule your colonoscopy to help detect colon cancer.  Smoking tobacco is very bad for your health. You should stop smoking immediately.  Continue low-dose chest CT scanning to rule out primary lung cancer  Return in 6 months for follow-up  Health Maintenance, Male A healthy lifestyle and preventative care can promote health and wellness.  Maintain regular health, dental, and eye exams.  Eat a healthy diet. Foods like vegetables, fruits, whole grains, low-fat dairy products, and lean protein foods contain the nutrients you need and are low in calories. Decrease your intake of foods high in solid fats, added sugars, and salt. Get information about a proper diet from your health care provider, if necessary.  Regular physical exercise is one of the most important things you can do for your health. Most adults should get at least 150 minutes of moderate-intensity exercise (any activity that increases your heart rate and causes you to sweat) each week. In addition, most adults need muscle-strengthening exercises on 2 or more days a week.   Maintain a healthy weight. The body mass index (BMI) is a screening tool to identify possible weight problems. It provides an estimate of body fat based on height and weight. Your health care provider can find your BMI and can help you achieve or maintain a healthy weight. For males 20 years and older:  A BMI below 18.5 is considered underweight.  A BMI of 18.5 to 24.9 is normal.  A BMI of 25 to 29.9 is considered overweight.  A BMI of 30 and above is considered obese.  Maintain normal blood lipids and cholesterol by exercising and minimizing your intake of saturated fat. Eat a balanced diet with plenty of fruits and vegetables. Blood tests for lipids and cholesterol should begin at age 53 and be repeated every 5 years. If your lipid or cholesterol levels are high, you are over age 80, or you are at high risk for heart disease, you may need your cholesterol  levels checked more frequently.Ongoing high lipid and cholesterol levels should be treated with medicines if diet and exercise are not working.  If you smoke, find out from your health care provider how to quit. If you do not use tobacco, do not start.  Lung cancer screening is recommended for adults aged 33-80 years who are at high risk for developing lung cancer because of a history of smoking. A yearly low-dose CT scan of the lungs is recommended for people who have at least a 30-pack-year history of smoking and are current smokers or have quit within the past 15 years. A pack year of smoking is smoking an average of 1 pack of cigarettes a day for 1 year (for example, a 30-pack-year history of smoking could mean smoking 1 pack a day for 30 years or 2 packs a day for 15 years). Yearly screening should continue until the smoker has stopped smoking for at least 15 years. Yearly screening should be stopped for people who develop a health problem that would prevent them from having lung cancer treatment.  If you choose to drink alcohol, do not have more than 2 drinks per day. One drink is considered to be 12 oz (360 mL) of beer, 5 oz (150 mL) of wine, or 1.5 oz (45 mL) of liquor.  Avoid the use of street drugs. Do not share needles with anyone. Ask for help if you need support or instructions about stopping the use of drugs.  High blood pressure causes heart disease and increases the  risk of stroke. High blood pressure is more likely to develop in:  People who have blood pressure in the end of the normal range (100-139/85-89 mm Hg).  People who are overweight or obese.  People who are African American.  If you are 57-28 years of age, have your blood pressure checked every 3-5 years. If you are 32 years of age or older, have your blood pressure checked every year. You should have your blood pressure measured twice--once when you are at a hospital or clinic, and once when you are not at a hospital or  clinic. Record the average of the two measurements. To check your blood pressure when you are not at a hospital or clinic, you can use:  An automated blood pressure machine at a pharmacy.  A home blood pressure monitor.  If you are 31-40 years old, ask your health care provider if you should take aspirin to prevent heart disease.  Diabetes screening involves taking a blood sample to check your fasting blood sugar level. This should be done once every 3 years after age 2 if you are at a normal weight and without risk factors for diabetes. Testing should be considered at a younger age or be carried out more frequently if you are overweight and have at least 1 risk factor for diabetes.  Colorectal cancer can be detected and often prevented. Most routine colorectal cancer screening begins at the age of 94 and continues through age 61. However, your health care provider may recommend screening at an earlier age if you have risk factors for colon cancer. On a yearly basis, your health care provider may provide home test kits to check for hidden blood in the stool. A small camera at the end of a tube may be used to directly examine the colon (sigmoidoscopy or colonoscopy) to detect the earliest forms of colorectal cancer. Talk to your health care provider about this at age 53 when routine screening begins. A direct exam of the colon should be repeated every 5-10 years through age 32, unless early forms of precancerous polyps or small growths are found.  People who are at an increased risk for hepatitis B should be screened for this virus. You are considered at high risk for hepatitis B if:  You were born in a country where hepatitis B occurs often. Talk with your health care provider about which countries are considered high risk.  Your parents were born in a high-risk country and you have not received a shot to protect against hepatitis B (hepatitis B vaccine).  You have HIV or AIDS.  You use needles  to inject street drugs.  You live with, or have sex with, someone who has hepatitis B.  You are a man who has sex with other men (MSM).  You get hemodialysis treatment.  You take certain medicines for conditions like cancer, organ transplantation, and autoimmune conditions.  Hepatitis C blood testing is recommended for all people born from [birth year] through 1966 and any individual with known risk factors for hepatitis C.  Healthy men should no longer receive prostate-specific antigen (PSA) blood tests as part of routine cancer screening. Talk to your health care provider about prostate cancer screening.  Testicular cancer screening is not recommended for adolescents or adult males who have no symptoms. Screening includes self-exam, a health care provider exam, and other screening tests. Consult with your health care provider about any symptoms you have or any concerns you have about testicular cancer.  Practice safe  sex. Use condoms and avoid high-risk sexual practices to reduce the spread of sexually transmitted infections (STIs).  You should be screened for STIs, including gonorrhea and chlamydia if:  You are sexually active and are younger than 24 years.  You are older than 24 years, and your health care provider tells you that you are at risk for this type of infection.  Your sexual activity has changed since you were last screened, and you are at an increased risk for chlamydia or gonorrhea. Ask your health care provider if you are at risk.  If you are at risk of being infected with HIV, it is recommended that you take a prescription medicine daily to prevent HIV infection. This is called pre-exposure prophylaxis (PrEP). You are considered at risk if:  You are a man who has sex with other men (MSM).  You are a heterosexual man who is sexually active with multiple partners.  You take drugs by injection.  You are sexually active with a partner who has HIV.  Talk with your health  care provider about whether you are at high risk of being infected with HIV. If you choose to begin PrEP, you should first be tested for HIV. You should then be tested every 3 months for as long as you are taking PrEP.  Use sunscreen. Apply sunscreen liberally and repeatedly throughout the day. You should seek shade when your shadow is shorter than you. Protect yourself by wearing long sleeves, pants, a wide-brimmed hat, and sunglasses year round whenever you are outdoors.  Tell your health care provider of new moles or changes in moles, especially if there is a change in shape or color. Also, tell your health care provider if a mole is larger than the size of a pencil eraser.  A one-time screening for abdominal aortic aneurysm (AAA) and surgical repair of large AAAs by ultrasound is recommended for men aged 64-75 years who are current or former smokers.  Stay current with your vaccines (immunizations).   This information is not intended to replace advice given to you by your health care provider. Make sure you discuss any questions you have with your health care provider.   Document Released: 01/29/2008 Document Revised: 08/23/2014 Document Reviewed: 12/28/2010 Elsevier Interactive Patient Education 2016 [employer] Fat and Cholesterol Restricted Diet High levels of fat and cholesterol in your blood may lead to various health problems, such as diseases of the heart, blood vessels, gallbladder, liver, and pancreas. Fats are concentrated sources of energy that come in various forms. Certain types of fat, including saturated fat, may be harmful in excess. Cholesterol is a substance needed by your body in small amounts. Your body makes all the cholesterol it needs. Excess cholesterol comes from the food you eat. When you have high levels of cholesterol and saturated fat in your blood, health problems can develop because the excess fat and cholesterol will gather along the walls of your blood vessels,  causing them to narrow. Choosing the right foods will help you control your intake of fat and cholesterol. This will help keep the levels of these substances in your blood within normal limits and reduce your risk of disease. WHAT IS MY PLAN? Your health care provider recommends that you:  Get no more than __________ % of the total calories in your daily diet from fat.  Limit your intake of saturated fat to less than ______% of your total calories each day.  Limit the amount of cholesterol in your diet to  less than _________mg per day. WHAT TYPES OF FAT SHOULD I CHOOSE?  Choose healthy fats more often. Choose monounsaturated and polyunsaturated fats, such as olive and canola oil, flaxseeds, walnuts, almonds, and seeds.  Eat more omega-3 fats. Good choices include salmon, mackerel, sardines, tuna, flaxseed oil, and ground flaxseeds. Aim to eat fish at least two times a week.  Limit saturated fats. Saturated fats are primarily found in animal products, such as meats, butter, and cream. Plant sources of saturated fats include palm oil, palm kernel oil, and coconut oil.  Avoid foods with partially hydrogenated oils in them. These contain trans fats. Examples of foods that contain trans fats are stick margarine, some tub margarines, cookies, crackers, and other baked goods. WHAT GENERAL GUIDELINES DO I NEED TO FOLLOW? These guidelines for healthy eating will help you control your intake of fat and cholesterol:  Check food labels carefully to identify foods with trans fats or high amounts of saturated fat.  Fill one half of your plate with vegetables and green salads.  Fill one fourth of your plate with whole grains. Look for the word "whole" as the first word in the ingredient list.  Fill one fourth of your plate with lean protein foods.  Limit fruit to two servings a day. Choose fruit instead of juice.  Eat more foods that contain soluble fiber. Examples of foods that contain this type of  fiber are apples, broccoli, carrots, beans, peas, and barley. Aim to get 20-30 g of fiber per day.  Eat more home-cooked food and less restaurant, buffet, and fast food.  Limit or avoid alcohol.  Limit foods high in starch and sugar.  Limit fried foods.  Cook foods using methods other than frying. Baking, boiling, grilling, and broiling are all great options.  Lose weight if you are overweight. Losing just 5-10% of your initial body weight can help your overall health and prevent diseases such as diabetes and heart disease. WHAT FOODS CAN I EAT? Grains Whole grains, such as whole wheat or whole grain breads, crackers, cereals, and pasta. Unsweetened oatmeal, bulgur, barley, quinoa, or brown rice. Corn or whole wheat flour tortillas. Vegetables Fresh or frozen vegetables (raw, steamed, roasted, or grilled). Green salads. Fruits All fresh, canned (in natural juice), or frozen fruits. Meat and Other Protein Products Ground beef (85% or leaner), grass-fed beef, or beef trimmed of fat. Skinless chicken or [place]. Ground chicken or [place]. Pork trimmed of fat. All fish and seafood. Eggs. Dried beans, peas, or lentils. Unsalted nuts or seeds. Unsalted canned or dry beans. Dairy Low-fat dairy products, such as skim or 1% milk, 2% or reduced-fat cheeses, low-fat ricotta or cottage cheese, or plain low-fat yogurt. Fats and Oils Tub margarines without trans fats. Light or reduced-fat mayonnaise and salad dressings. Avocado. Olive, canola, sesame, or safflower oils. Natural peanut or almond butter (choose ones without added sugar and oil). The items listed above may not be a complete list of recommended foods or beverages. Contact your dietitian for more options. WHAT FOODS ARE NOT RECOMMENDED? Grains White bread. White pasta. White rice. Cornbread. Bagels, pastries, and croissants. Crackers that contain trans fat. Vegetables White potatoes. Corn. Creamed or fried vegetables. Vegetables in a  cheese sauce. Fruits Dried fruits. Canned fruit in light or heavy syrup. Fruit juice. Meat and Other Protein Products Fatty cuts of meat. Ribs, chicken wings, bacon, sausage, bologna, salami, chitterlings, fatback, hot dogs, bratwurst, and packaged luncheon meats. Liver and organ meats. Dairy Whole or 2% milk, cream, half-and-half,  and cream cheese. Whole milk cheeses. Whole-fat or sweetened yogurt. Full-fat cheeses. Nondairy creamers and whipped toppings. Processed cheese, cheese spreads, or cheese curds. Sweets and Desserts Corn syrup, sugars, honey, and molasses. Candy. Jam and jelly. Syrup. Sweetened cereals. Cookies, pies, cakes, donuts, muffins, and ice cream. Fats and Oils Butter, stick margarine, lard, shortening, ghee, or bacon fat. Coconut, palm kernel, or palm oils. Beverages Alcohol. Sweetened drinks (such as sodas, lemonade, and fruit drinks or punches). The items listed above may not be a complete list of foods and beverages to avoid. Contact your dietitian for more information.   This information is not intended to replace advice given to you by your health care provider. Make sure you discuss any questions you have with your health care provider.   Document Released: 08/02/2005 Document Revised: 08/23/2014 Document Reviewed: 10/31/2013 Elsevier Interactive Patient Education [employer].

## 2016-02-14 ENCOUNTER — Other Ambulatory Visit: Payer: Self-pay | Admitting: Internal Medicine

## 2016-02-16 ENCOUNTER — Ambulatory Visit (AMBULATORY_SURGERY_CENTER): Payer: Self-pay | Admitting: *Deleted

## 2016-02-16 VITALS — Ht 68.0 in | Wt 188.0 lb

## 2016-02-16 DIAGNOSIS — Z1211 Encounter for screening for malignant neoplasm of colon: Secondary | ICD-10-CM

## 2016-02-16 MED ORDER — NA SULFATE-K SULFATE-MG SULF 17.5-3.13-1.6 GM/177ML PO SOLN: ORAL | Status: DC

## 2016-02-16 NOTE — Progress Notes (Signed)
Patient denies any allergies to eggs or soy. Patient denies any problems with anesthesia/sedation. Patient denies any oxygen use at home and does not take any diet/weight loss medications. Patient is aware to hold Metformin morning of procedure.

## 2016-02-17 ENCOUNTER — Encounter: Payer: Self-pay | Admitting: Gastroenterology

## 2016-03-01 ENCOUNTER — Encounter: Payer: Self-pay | Admitting: Gastroenterology

## 2016-03-01 ENCOUNTER — Ambulatory Visit (AMBULATORY_SURGERY_CENTER): Payer: BLUE CROSS/BLUE SHIELD | Admitting: Gastroenterology

## 2016-03-01 VITALS — BP 119/68 | HR 62 | Temp 98.4°F | Resp 11 | Ht 68.0 in | Wt 188.0 lb

## 2016-03-01 DIAGNOSIS — D122 Benign neoplasm of ascending colon: Secondary | ICD-10-CM

## 2016-03-01 DIAGNOSIS — Z1211 Encounter for screening for malignant neoplasm of colon: Secondary | ICD-10-CM | POA: Diagnosis not present

## 2016-03-01 MED ORDER — SODIUM CHLORIDE 0.9 % IV SOLN: 500.0000 mL | INTRAVENOUS | Status: DC

## 2016-03-01 NOTE — Progress Notes (Signed)
Called to room to assist during endoscopic procedure.  Patient ID and intended procedure confirmed with present staff. Received instructions for my participation in the procedure from the performing physician.  

## 2016-03-01 NOTE — Progress Notes (Signed)
No egg or soy allergy known to patient  No issues with past sedation with any surgeries  or procedures, no intubation problems  No diet pills per patient No home 02 use per patient  No blood thinners per patient  Pt denies issues with constipation   

## 2016-03-01 NOTE — Progress Notes (Signed)
A/ox3, pleased with MAC, report to RN 

## 2016-03-01 NOTE — Patient Instructions (Signed)
YOU HAD AN ENDOSCOPIC PROCEDURE TODAY AT [facility]:   Refer to the procedure report that was given to you for any specific questions about what was found during the examination.  If the procedure report does not answer your questions, please call your gastroenterologist to clarify.  If you requested that your care partner not be given the details of your procedure findings, then the procedure report has been included in a sealed envelope for you to review at your convenience later.  YOU SHOULD EXPECT: Some feelings of bloating in the abdomen. Passage of more gas than usual.  Walking can help get rid of the air that was put into your GI tract during the procedure and reduce the bloating. If you had a lower endoscopy (such as a colonoscopy or flexible sigmoidoscopy) you may notice spotting of blood in your stool or on the toilet paper. If you underwent a bowel prep for your procedure, you may not have a normal bowel movement for a few days.  Please Note:  You might notice some irritation and congestion in your nose or some drainage.  This is from the oxygen used during your procedure.  There is no need for concern and it should clear up in a day or so.  SYMPTOMS TO REPORT IMMEDIATELY:   Following lower endoscopy (colonoscopy or flexible sigmoidoscopy):  Excessive amounts of blood in the stool  Significant tenderness or worsening of abdominal pains  Swelling of the abdomen that is new, acute  Fever of 100F or higher   For urgent or emergent issues, a gastroenterologist can be reached at any hour by calling [phone].   DIET: Your first meal following the procedure should be a small meal and then it is ok to progress to your normal diet. Heavy or fried foods are harder to digest and may make you feel nauseous or bloated.  Likewise, meals heavy in dairy and vegetables can increase bloating.  Drink plenty of fluids but you should avoid alcoholic beverages for 24  hours.  ACTIVITY:  You should plan to take it easy for the rest of today and you should NOT DRIVE or use heavy machinery until tomorrow (because of the sedation medicines used during the test).    FOLLOW UP: Our staff will call the number listed on your records the next business day following your procedure to check on you and address any questions or concerns that you may have regarding the information given to you following your procedure. If we do not reach you, we will leave a message.  However, if you are feeling well and you are not experiencing any problems, there is no need to return our call.  We will assume that you have returned to your regular daily activities without incident.  If any biopsies were taken you will be contacted by phone or by letter within the next 1-3 weeks.  Please call us at [phone] if you have not heard about the biopsies in 3 weeks.    SIGNATURES/CONFIDENTIALITY: You and/or your care partner have signed paperwork which will be entered into your electronic medical record.  These signatures attest to the fact that that the information above on your After Visit Summary has been reviewed and is understood.  Full responsibility of the confidentiality of this discharge information lies with you and/or your care-partner.  Polyps handouts given. Resume previous medications.

## 2016-03-01 NOTE — Op Note (Signed)
[facility] Patient Name: Usbaldo Pannone Procedure Date: 03/01/2016 7:48 AM MRN: [id number] Endoscopist: Ladene Artist , MD Age: 60 Referring MD:  Date of Birth: 1955/10/30 Gender: Male Account #: [id number] Procedure:                Colonoscopy Indications:              Screening for colorectal malignant neoplasm Medicines:                Monitored Anesthesia Care Procedure:                Pre-Anesthesia Assessment:                           - Prior to the procedure, a History and Physical                            was performed, and patient medications and                            allergies were reviewed. The patient's tolerance of                            previous anesthesia was also reviewed. The risks                            and benefits of the procedure and the sedation                            options and risks were discussed with the patient.                            All questions were answered, and informed consent                            was obtained. Prior Anticoagulants: The patient has                            taken no previous anticoagulant or antiplatelet                            agents. ASA Grade Assessment: II - A patient with                            mild systemic disease. After reviewing the risks                            and benefits, the patient was deemed in                            satisfactory condition to undergo the procedure.                           After obtaining informed consent, the colonoscope  was passed under direct vision. Throughout the                            procedure, the patient's blood pressure, pulse, and                            oxygen saturations were monitored continuously. The                            Model PCF-H190L [id number]) scope was introduced                            through the anus and advanced to the the cecum,                            identified by  appendiceal orifice and ileocecal                            valve. The ileocecal valve, appendiceal orifice,                            and rectum were photographed. The quality of the                            bowel preparation was excellent. The colonoscopy                            was performed without difficulty. The patient                            tolerated the procedure well. Scope In: 8:08:52 AM Scope Out: 8:25:06 AM Scope Withdrawal Time: 0 hours 13 minutes 40 seconds  Total Procedure Duration: 0 hours 16 minutes 14 seconds  Findings:                 Two sessile polyps were found in the ascending                            colon. The polyps were 5 to 6 mm in size. These                            polyps were removed with a cold snare. Resection                            and retrieval were complete.                           The exam was otherwise without abnormality on                            direct and retroflexion views. Complications:            No immediate complications. Estimated blood loss:  None. Estimated Blood Loss:     Estimated blood loss: none. Impression:               - Two 5 to 6 mm polyps in the ascending colon,                            removed with a cold snare. Resected and retrieved.                           - The examination was otherwise normal on direct                            and retroflexion views. Recommendation:           - Repeat colonoscopy in 5 years for surveillance if                            polyp(s) precancerous, otherwise 10 years.                           - Patient has a contact number available for                            emergencies. The signs and symptoms of potential                            delayed complications were discussed with the                            patient. Return to normal activities tomorrow.                            Written discharge instructions were provided to the                             patient.                           - Resume previous diet.                           - Continue present medications.                           - Await pathology results. Ladene Artist, MD 03/01/2016 8:32:44 AM This report has been signed electronically.

## 2016-03-02 ENCOUNTER — Telehealth: Payer: Self-pay

## 2016-03-02 NOTE — Telephone Encounter (Signed)
  Follow up Call-  Call back number 03/01/2016  Post procedure Call Back phone  # [phone]  Permission to leave phone message Yes     Patient questions:  Do you have a fever, pain , or abdominal swelling? No. Pain Score  0 *  Have you tolerated food without any problems? Yes.    Have you been able to return to your normal activities? Yes.    Do you have any questions about your discharge instructions: Diet   No. Medications  No. Follow up visit  No.  Do you have questions or concerns about your Care? No.  Actions: * If pain score is 4 or above: No action needed, pain <4.

## 2016-03-09 ENCOUNTER — Encounter: Payer: Self-pay | Admitting: Gastroenterology

## 2016-03-14 ENCOUNTER — Other Ambulatory Visit: Payer: Self-pay | Admitting: Internal Medicine

## 2016-03-22 ENCOUNTER — Encounter: Payer: Self-pay | Admitting: Internal Medicine

## 2016-03-22 ENCOUNTER — Ambulatory Visit (INDEPENDENT_AMBULATORY_CARE_PROVIDER_SITE_OTHER): Payer: BLUE CROSS/BLUE SHIELD | Admitting: Internal Medicine

## 2016-03-22 VITALS — BP 130/70 | HR 69 | Temp 98.2°F | Ht 68.0 in | Wt 192.0 lb

## 2016-03-22 DIAGNOSIS — E119 Type 2 diabetes mellitus without complications: Secondary | ICD-10-CM | POA: Diagnosis not present

## 2016-03-22 DIAGNOSIS — L739 Follicular disorder, unspecified: Secondary | ICD-10-CM

## 2016-03-22 MED ORDER — DOXYCYCLINE HYCLATE 100 MG PO TABS: 100.0000 mg | ORAL_TABLET | Freq: Two times a day (BID) | ORAL | 0 refills | Status: DC

## 2016-03-22 NOTE — Progress Notes (Signed)
Subjective:    Patient ID: Gregory Jensen, male    DOB: 09-Apr-1956, 60 y.o.   MRN: [id number]  HPI  60 year old patient who has a history of well-controlled type 2 diabetes.  He presents with a chief complaint of intermittent skin infections that it been problematic for the past 2 or 3 years.  These usually occur on extremities medication in the lower abdominal region. He began as a small macule and develop  into a larger erythematous lesion with a central pustule and resolve fairly quickly without scarring.  Over the past few days,   he has noted 2 lesions involving the right lateral distal leg just proximal to the ankle.  He is concerned because these have been slightly more painful and there are 2 lesions in close proximity  Past Medical History:  Diagnosis Date  . Cancer ([place]) 4 years ago   Prostate Cancer   . Diabetes mellitus without complication ([place])   . Hyperlipidemia   . Sleep apnea   . Smoker      Social History   Social History  . Marital status: Divorced    Spouse name: N/A  . Number of children: N/A  . Years of education: N/A   Occupational History  . [employer]    Social History Main Topics  . Smoking status: Current Every Day Smoker    Packs/day: 1.00    Years: 40.00    Types: Cigarettes  . Smokeless tobacco: Never Used  . Alcohol use No  . Drug use: No  . Sexual activity: Not on file   Other Topics Concern  . Not on file   Social History Narrative  . No narrative on file    Past Surgical History:  Procedure Laterality Date  . BACK SURGERY  15 years ago  . COLONOSCOPY  2007 (at age 79)   pt does not know MD name/normal exam per pt.  Marland Kitchen PROSTATECTOMY  4 years ago    Family History  Problem Relation Age of Onset  . Diabetes      family hx  . Hypertension Sister   . Heart disease Sister   . Heart disease Father   . Alzheimer's disease Mother   . Hypertension Sister   . Colon cancer Neg Hx     No Known Allergies  Current  Outpatient Prescriptions on File Prior to Visit  Medication Sig Dispense Refill  . aspirin 81 MG EC tablet Take 81 mg by mouth daily.      Marland Kitchen atorvastatin (LIPITOR) 40 MG tablet TAKE 1 TABLET BY MOUTH EVERY DAY 90 tablet 0  . fenofibrate 160 MG tablet TAKE 1 TABLET(160 MG) BY MOUTH DAILY 90 tablet 1  . glucose blood (ONETOUCH VERIO) test strip 1 each by Other route daily as needed for other. Use as instructed 100 each 4  . metFORMIN (GLUCOPHAGE) 500 MG tablet TAKE 1 TABLET BY MOUTH TWICE DAILY WITH A MEAL 180 tablet 1  . Omega-3 Fatty Acids (FISH OIL) 1200 MG CAPS Take 1 capsule by mouth daily.    Glory Rosebush DELICA LANCETS 78L MISC 1 each by Does not apply route daily as needed. 100 each 4  . varenicline (CHANTIX CONTINUING MONTH PAK) 1 MG tablet Take 1 tablet (1 mg total) by mouth 2 (two) times daily. (Patient not taking: Reported on 02/16/2016) 60 tablet 3   No current facility-administered medications on file prior to visit.     BP 130/70 (BP Location: Left Arm, Patient Position: Sitting,  Cuff Size: Normal)   Pulse 69   Temp 98.2 F (36.8 C) (Oral)   Ht '5\' 8"'$  (1.727 m)   Wt 192 lb (87.1 kg)   SpO2 98%   BMI 29.19 kg/m     Review of Systems  Constitutional: Negative.   Skin: Positive for rash.       Objective:   Physical Exam  Constitutional: He appears well-developed and well-nourished. No distress.  Skin:  2.  Erythematous lesions involving the right lateral lower leg 1 cm and 1.5 centimeters in diameter.  The larger lesion had a small central pustule and the smaller lesion had a small crusted ulcer in the center.          Assessment & Plan:   Probable folliculitis.  Doubt drug sensitivity vasculitis or other neutrophilic dermatoses.  These things have always resolved spontaneously.  Local skin care discussed.  He was given a prescription for doxycycline to take only if clinical worsening Diabetes mellitus  Nyoka Cowden, MD

## 2016-03-22 NOTE — Patient Instructions (Addendum)
Folliculitis °Folliculitis is redness, soreness, and swelling (inflammation) of the hair follicles. This condition can occur anywhere on the body. People with weakened immune systems, diabetes, or obesity have a greater risk of getting folliculitis. °CAUSES °· Bacterial infection. This is the most common cause. °· Fungal infection. °· Viral infection. °· Contact with certain chemicals, especially oils and tars. °Long-term folliculitis can result from bacteria that live in the nostrils. The bacteria may trigger multiple outbreaks of folliculitis over time. °SYMPTOMS °Folliculitis most commonly occurs on the scalp, thighs, legs, back, buttocks, and areas where hair is shaved frequently. An early sign of folliculitis is a small, white or yellow, pus-filled, itchy lesion (pustule). These lesions appear on a red, inflamed follicle. They are usually less than 0.2 inches (5 mm) wide. When there is an infection of the follicle that goes deeper, it becomes a boil or furuncle. A group of closely packed boils creates a larger lesion (carbuncle). Carbuncles tend to occur in hairy, sweaty areas of the body. °DIAGNOSIS  °Your caregiver can usually tell what is wrong by doing a physical exam. A sample may be taken from one of the lesions and tested in a lab. This can help determine what is causing your folliculitis. °TREATMENT  °Treatment may include: °· Applying warm compresses to the affected areas. °· Taking antibiotic medicines orally or applying them to the skin. °· Draining the lesions if they contain a large amount of pus or fluid. °· Laser hair removal for cases of long-lasting folliculitis. This helps to prevent regrowth of the hair. °HOME CARE INSTRUCTIONS °· Apply warm compresses to the affected areas as directed by your caregiver. °· If antibiotics are prescribed, take them as directed. Finish them even if you start to feel better. °· You may take over-the-counter medicines to relieve itching. °· Do not shave irritated  skin. °· Follow up with your caregiver as directed. °SEEK IMMEDIATE MEDICAL CARE IF:  °· You have increasing redness, swelling, or pain in the affected area. °· You have a fever. °MAKE SURE YOU: °· Understand these instructions. °· Will watch your condition. °· Will get help right away if you are not doing well or get worse. °  °This information is not intended to replace advice given to you by your health care provider. Make sure you discuss any questions you have with your health care provider. °  °Document Released: 10/11/2001 Document Revised: 08/23/2014 Document Reviewed: 11/02/2011 °Elsevier Interactive Patient Education ©2016 [employer] Inc[employer] ° °

## 2016-06-12 ENCOUNTER — Other Ambulatory Visit: Payer: Self-pay | Admitting: Internal Medicine

## 2016-06-14 ENCOUNTER — Other Ambulatory Visit: Payer: Self-pay | Admitting: Internal Medicine

## 2016-06-17 DIAGNOSIS — L218 Other seborrheic dermatitis: Secondary | ICD-10-CM | POA: Diagnosis not present

## 2016-06-17 DIAGNOSIS — L821 Other seborrheic keratosis: Secondary | ICD-10-CM | POA: Diagnosis not present

## 2016-06-17 DIAGNOSIS — L570 Actinic keratosis: Secondary | ICD-10-CM | POA: Diagnosis not present

## 2016-06-24 ENCOUNTER — Telehealth: Payer: Self-pay | Admitting: Internal Medicine

## 2016-06-24 NOTE — Telephone Encounter (Signed)
Pt states he thinks he is supposed to have a fasting lab prior to his follow up on 12/01. Pt states Dr Raliegh Ip wants to check his lipids? Please advise

## 2016-06-24 NOTE — Telephone Encounter (Signed)
Pt is suppose to just have follow up. Nothing mentioned in note to recheck Lipids.

## 2016-06-25 DIAGNOSIS — E119 Type 2 diabetes mellitus without complications: Secondary | ICD-10-CM | POA: Diagnosis not present

## 2016-06-25 LAB — HM DIABETES EYE EXAM

## 2016-07-01 NOTE — Telephone Encounter (Signed)
Pt is aware.  

## 2016-07-16 ENCOUNTER — Encounter: Payer: Self-pay | Admitting: Internal Medicine

## 2016-07-16 ENCOUNTER — Ambulatory Visit (INDEPENDENT_AMBULATORY_CARE_PROVIDER_SITE_OTHER): Payer: BLUE CROSS/BLUE SHIELD | Admitting: Internal Medicine

## 2016-07-16 VITALS — BP 122/70 | HR 74 | Temp 98.5°F | Resp 20 | Ht 68.0 in | Wt 195.0 lb

## 2016-07-16 DIAGNOSIS — E119 Type 2 diabetes mellitus without complications: Secondary | ICD-10-CM

## 2016-07-16 DIAGNOSIS — E8881 Metabolic syndrome: Secondary | ICD-10-CM | POA: Diagnosis not present

## 2016-07-16 NOTE — Progress Notes (Signed)
Subjective:    Patient ID: Gregory Jensen, male    DOB: [birth year], 60 y.o.   MRN: [id number]  HPI  60 year old patient who is seen today for his six-month follow-up.  He has a history of type 2 diabetes without complications.  He remains on metformin therapy alone.  Remains on statin therapy, but last visit.  Fenofibrate was discontinued.  No concerns or complaints.  He does have a history of prostate cancer.  No cardiopulmonary complaints  Past Medical History:  Diagnosis Date  . Cancer ([place]) 4 years ago   Prostate Cancer   . Diabetes mellitus without complication ([place])   . Hyperlipidemia   . Sleep apnea   . Smoker      Social History   Social History  . Marital status: Divorced    Spouse name: N/A  . Number of children: N/A  . Years of education: N/A   Occupational History  . [employer]    Social History Main Topics  . Smoking status: Current Every Day Smoker    Packs/day: 1.00    Years: 40.00    Types: Cigarettes  . Smokeless tobacco: Never Used  . Alcohol use No  . Drug use: No  . Sexual activity: Not on file   Other Topics Concern  . Not on file   Social History Narrative  . No narrative on file    Past Surgical History:  Procedure Laterality Date  . BACK SURGERY  15 years ago  . COLONOSCOPY  2007 (at age 66)   pt does not know MD name/normal exam per pt.  Marland Kitchen PROSTATECTOMY  4 years ago    Family History  Problem Relation Age of Onset  . Diabetes      family hx  . Hypertension Sister   . Heart disease Sister   . Heart disease Father   . Alzheimer's disease Mother   . Hypertension Sister   . Colon cancer Neg Hx     No Known Allergies  Current Outpatient Prescriptions on File Prior to Visit  Medication Sig Dispense Refill  . aspirin 81 MG EC tablet Take 81 mg by mouth daily.      Marland Kitchen atorvastatin (LIPITOR) 40 MG tablet TAKE 1 TABLET BY MOUTH EVERY DAY 90 tablet 1  . glucose blood (ONETOUCH VERIO) test strip 1 each by Other route daily  as needed for other. Use as instructed 100 each 4  . metFORMIN (GLUCOPHAGE) 500 MG tablet TAKE 1 TABLET BY MOUTH TWICE DAILY WITH A MEAL 180 tablet 1  . Omega-3 Fatty Acids (FISH OIL) 1200 MG CAPS Take 1 capsule by mouth daily.    Glory Rosebush DELICA LANCETS 14N MISC 1 each by Does not apply route daily as needed. 100 each 4  . fenofibrate 160 MG tablet TAKE 1 TABLET(160 MG) BY MOUTH DAILY (Patient not taking: Reported on 07/16/2016) 90 tablet 1   No current facility-administered medications on file prior to visit.     BP 122/70 (BP Location: Left Arm, Patient Position: Sitting, Cuff Size: Normal)   Pulse 74   Temp 98.5 F (36.9 C) (Oral)   Resp 20   Ht '5\' 8"'$  (1.727 m)   Wt 195 lb (88.5 kg)   SpO2 98%   BMI 29.65 kg/m     Review of Systems  Constitutional: Negative for appetite change, chills, fatigue and fever.  HENT: Negative for congestion, dental problem, ear pain, hearing loss, sore throat, tinnitus, trouble swallowing and voice change.  Eyes: Negative for pain, discharge and visual disturbance.  Respiratory: Negative for cough, chest tightness, wheezing and stridor.   Cardiovascular: Negative for chest pain, palpitations and leg swelling.  Gastrointestinal: Negative for abdominal distention, abdominal pain, blood in stool, constipation, diarrhea, nausea and vomiting.  Genitourinary: Negative for difficulty urinating, discharge, flank pain, genital sores, hematuria and urgency.  Musculoskeletal: Negative for arthralgias, back pain, gait problem, joint swelling, myalgias and neck stiffness.  Skin: Negative for rash.  Neurological: Negative for dizziness, syncope, speech difficulty, weakness, numbness and headaches.  Hematological: Negative for adenopathy. Does not bruise/bleed easily.  Psychiatric/Behavioral: Negative for behavioral problems and dysphoric mood. The patient is not nervous/anxious.        Objective:   Physical Exam  Constitutional: He is oriented to person,  place, and time. He appears well-developed.  HENT:  Head: Normocephalic.  Right Ear: External ear normal.  Left Ear: External ear normal.  Eyes: Conjunctivae and EOM are normal.  Neck: Normal range of motion.  Cardiovascular: Normal rate and normal heart sounds.   Pulmonary/Chest: Breath sounds normal.  Abdominal: Bowel sounds are normal.  Musculoskeletal: Normal range of motion. He exhibits no edema or tenderness.  Neurological: He is alert and oriented to person, place, and time.  Psychiatric: He has a normal mood and affect. His behavior is normal.          Assessment & Plan:   Diabetes mellitus.  Appears to be well controlled.  Will check hemoglobin A1c.  No change in therapy.  Patient has had a recent eye exam. Dyslipidemia.  Continue statin therapy  CPX 6 months  Nishant Schrecengost Pilar Plate

## 2016-07-16 NOTE — Progress Notes (Signed)
Pre visit review using our clinic review tool, if applicable. No additional management support is needed unless otherwise documented below in the visit note. 

## 2016-07-16 NOTE — Patient Instructions (Signed)
Limit your sodium (Salt) intake    It is important that you exercise regularly, at least 20 minutes 3 to 4 times per week.  If you develop chest pain or shortness of breath seek  medical attention.   Please check your hemoglobin A1c every 6  months

## 2016-07-17 LAB — HEMOGLOBIN A1C
Hgb A1c MFr Bld: 6.3 % — ABNORMAL HIGH (ref <5.7)
Mean Plasma Glucose: 134 mg/dL

## 2016-07-22 ENCOUNTER — Encounter: Payer: Self-pay | Admitting: Internal Medicine

## 2016-11-19 ENCOUNTER — Ambulatory Visit (INDEPENDENT_AMBULATORY_CARE_PROVIDER_SITE_OTHER): Payer: BLUE CROSS/BLUE SHIELD | Admitting: Internal Medicine

## 2016-11-19 ENCOUNTER — Encounter: Payer: Self-pay | Admitting: Internal Medicine

## 2016-11-19 VITALS — BP 118/60 | HR 70 | Temp 98.5°F | Ht 68.0 in | Wt 192.3 lb

## 2016-11-19 DIAGNOSIS — M549 Dorsalgia, unspecified: Secondary | ICD-10-CM

## 2016-11-19 DIAGNOSIS — R3989 Other symptoms and signs involving the genitourinary system: Secondary | ICD-10-CM | POA: Diagnosis not present

## 2016-11-19 NOTE — Patient Instructions (Signed)
Urine test does not look like obvious infection and there is no blood by screening. However we will culture the urine to be sure there are no bacteria.  Contact us if you get fever chills progressive symptoms. Would follow-up with Dr. Raliegh Ip if you're having persistent back pain. This could still be mechanical back pain  And get better with time. Marland Kitchen

## 2016-11-19 NOTE — Progress Notes (Signed)
Chief Complaint  Patient presents with  . Back Pain    mid line/ urine a dark color     HPI: Gregory Jensen Nails 61 y.o.  sda appt States that he has noted a darker colored urine for the last almost a week. And had some lower or mid back pain that was different than his regular back pain but no associated chills fever or dysuria urinary obstructive symptoms are other. History of prostate cancer 4 years ago treated with her injury no radiation other treatments and no history of UTI. No radiation discomfort. No change in medicines. ROS: See pertinent positives and negatives per HPI.  Past Medical History:  Diagnosis Date  . Cancer ([place]) 4 years ago   Prostate Cancer   . Diabetes mellitus without complication ([place])   . Hyperlipidemia   . Sleep apnea   . Smoker     Family History  Problem Relation Age of Onset  . Diabetes      family hx  . Hypertension Sister   . Heart disease Sister   . Heart disease Father   . Alzheimer's disease Mother   . Hypertension Sister   . Colon cancer Neg Hx     Social History   Social History  . Marital status: Divorced    Spouse name: N/A  . Number of children: N/A  . Years of education: N/A   Occupational History  . [employer]    Social History Main Topics  . Smoking status: Current Every Day Smoker    Packs/day: 1.00    Years: 40.00    Types: Cigarettes  . Smokeless tobacco: Never Used  . Alcohol use No  . Drug use: No  . Sexual activity: Not Asked   Other Topics Concern  . None   Social History Narrative  . None    Outpatient Medications Prior to Visit  Medication Sig Dispense Refill  . aspirin 81 MG EC tablet Take 81 mg by mouth daily.      Marland Kitchen atorvastatin (LIPITOR) 40 MG tablet TAKE 1 TABLET BY MOUTH EVERY DAY 90 tablet 1  . glucose blood (ONETOUCH VERIO) test strip 1 each by Other route daily as needed for other. Use as instructed 100 each 4  . metFORMIN (GLUCOPHAGE) 500 MG tablet TAKE 1 TABLET BY MOUTH TWICE  DAILY WITH A MEAL 180 tablet 1  . Omega-3 Fatty Acids (FISH OIL) 1200 MG CAPS Take 1 capsule by mouth daily.    Glory Rosebush DELICA LANCETS 20U MISC 1 each by Does not apply route daily as needed. 100 each 4  . fenofibrate 160 MG tablet TAKE 1 TABLET(160 MG) BY MOUTH DAILY 90 tablet 1   No facility-administered medications prior to visit.      EXAM:  BP 118/60 (BP Location: Left Arm, Patient Position: Sitting, Cuff Size: Normal)   Pulse 70   Temp 98.5 F (36.9 C) (Oral)   Ht '5\' 8"'$  (1.727 m)   Wt 192 lb 4.8 oz (87.2 kg)   BMI 29.24 kg/m   Body mass index is 29.24 kg/m.  GENERAL: vitals reviewed and listed above, alert, oriented, appears well hydrated and in no acute distress HEENT: atraumatic, conjunctiva  clear, no obvious abnormalities on inspection of external nose and earsNECK: no obvious masses on inspection palpation  Abdomen:  Sof,t normal bowel sounds without hepatosplenomegaly, no guarding rebound or masses no CVA tenderness  Points to right para verteb flank area   Of cincern  Neg psoas sign  Or  Acute finding CV: HRRR, no clubbing cyanosis or  peripheral edema nl cap refill  MS: moves all extremities without noticeable focal  abnormality PSYCH: pleasant and cooperative, no obvious depression or anxiety  ASSESSMENT AND PLAN:  Discussed the following assessment and plan:  Back pain, unspecified back location, unspecified back pain laterality, unspecified chronicity - Plan: POCT Urinalysis Dipstick (Automated), Urine culture  Abnormal urine color - Plan: POCT Urinalysis Dipstick (Automated), Urine culture Doubt UTI based on urine findings and back pain may be mechanical but will do culture over the weekend. Follow-up with PCP if persistent progressive. Stay hydrated. -Patient advised to return or notify health care team  if symptoms worsen ,persist or new concerns arise.  Patient Instructions  Urine test does not look like obvious infection and there is no blood by  screening. However we will culture the urine to be sure there are no bacteria.  Contact us if you get fever chills progressive symptoms. Would follow-up with Dr. Raliegh Ip if you're having persistent back pain. This could still be mechanical back pain  And get better with time. Standley Brooking. Gregory Jensen M.D.

## 2016-11-22 LAB — URINE CULTURE

## 2016-12-08 ENCOUNTER — Other Ambulatory Visit: Payer: Self-pay | Admitting: Internal Medicine

## 2017-01-11 ENCOUNTER — Other Ambulatory Visit (INDEPENDENT_AMBULATORY_CARE_PROVIDER_SITE_OTHER): Payer: BLUE CROSS/BLUE SHIELD

## 2017-01-11 ENCOUNTER — Other Ambulatory Visit: Payer: BLUE CROSS/BLUE SHIELD

## 2017-01-11 DIAGNOSIS — R7989 Other specified abnormal findings of blood chemistry: Secondary | ICD-10-CM | POA: Diagnosis not present

## 2017-01-11 DIAGNOSIS — Z0000 Encounter for general adult medical examination without abnormal findings: Secondary | ICD-10-CM

## 2017-01-11 DIAGNOSIS — O24919 Unspecified diabetes mellitus in pregnancy, unspecified trimester: Secondary | ICD-10-CM

## 2017-01-11 DIAGNOSIS — E785 Hyperlipidemia, unspecified: Secondary | ICD-10-CM | POA: Diagnosis not present

## 2017-01-11 DIAGNOSIS — E119 Type 2 diabetes mellitus without complications: Secondary | ICD-10-CM

## 2017-01-11 LAB — CBC WITH DIFFERENTIAL/PLATELET
Basophils Absolute: 0 cells/uL (ref 0–200)
Basophils Relative: 0 %
Eosinophils Absolute: 180 cells/uL (ref 15–500)
Eosinophils Relative: 2 %
HCT: 45.5 % (ref 38.5–50.0)
Hemoglobin: 15.4 g/dL (ref 13.2–17.1)
Lymphocytes Relative: 25 %
Lymphs Abs: 2250 cells/uL (ref 850–3900)
MCH: 31.8 pg (ref 27.0–33.0)
MCHC: 33.8 g/dL (ref 32.0–36.0)
MCV: 93.8 fL (ref 80.0–100.0)
MPV: 11.4 fL (ref 7.5–12.5)
Monocytes Absolute: 450 cells/uL (ref 200–950)
Monocytes Relative: 5 %
Neutro Abs: 6120 cells/uL (ref 1500–7800)
Neutrophils Relative %: 68 %
Platelets: 156 10*3/uL (ref 140–400)
RBC: 4.85 MIL/uL (ref 4.20–5.80)
RDW: 13.5 % (ref 11.0–15.0)
WBC: 9 10*3/uL (ref 3.8–10.8)

## 2017-01-11 LAB — HEPATIC FUNCTION PANEL
ALT: 32 U/L (ref 0–53)
AST: 24 U/L (ref 0–37)
Albumin: 4.7 g/dL (ref 3.5–5.2)
Alkaline Phosphatase: 80 U/L (ref 39–117)
Bilirubin, Direct: 0.1 mg/dL (ref 0.0–0.3)
Total Bilirubin: 0.5 mg/dL (ref 0.2–1.2)
Total Protein: 7.1 g/dL (ref 6.0–8.3)

## 2017-01-11 LAB — POC URINALSYSI DIPSTICK (AUTOMATED)
Bilirubin, UA: NEGATIVE
Glucose, UA: NEGATIVE
Ketones, UA: NEGATIVE
Leukocytes, UA: NEGATIVE
Nitrite, UA: NEGATIVE
Protein, UA: NEGATIVE
Spec Grav, UA: >=1.03 — AB (ref 1.010–1.025)
Urobilinogen, UA: 0.2 E.U./dL
pH, UA: 6 (ref 5.0–8.0)

## 2017-01-11 LAB — LIPID PANEL
Cholesterol: 169 mg/dL (ref 0–200)
HDL: 17.5 mg/dL — ABNORMAL LOW (ref >39.00)
Total CHOL/HDL Ratio: 10
Triglycerides: 1427 mg/dL — ABNORMAL HIGH (ref 0.0–149.0)

## 2017-01-11 LAB — LDL CHOLESTEROL, DIRECT: Direct LDL: 25 mg/dL

## 2017-01-11 LAB — BASIC METABOLIC PANEL
BUN: 16 mg/dL (ref 6–23)
CO2: 26 mEq/L (ref 19–32)
Calcium: 10.5 mg/dL (ref 8.4–10.5)
Chloride: 104 mEq/L (ref 96–112)
Creatinine, Ser: 0.94 mg/dL (ref 0.40–1.50)
GFR: 86.47 mL/min (ref >60.00)
Glucose, Bld: 171 mg/dL — ABNORMAL HIGH (ref 70–99)
Potassium: 4.4 mEq/L (ref 3.5–5.1)
Sodium: 138 mEq/L (ref 135–145)

## 2017-01-11 LAB — HEMOGLOBIN A1C: Hgb A1c MFr Bld: 6.8 % — ABNORMAL HIGH (ref 4.6–6.5)

## 2017-01-14 ENCOUNTER — Encounter: Payer: Self-pay | Admitting: Internal Medicine

## 2017-01-14 ENCOUNTER — Ambulatory Visit (INDEPENDENT_AMBULATORY_CARE_PROVIDER_SITE_OTHER): Payer: BLUE CROSS/BLUE SHIELD | Admitting: Internal Medicine

## 2017-01-14 DIAGNOSIS — E119 Type 2 diabetes mellitus without complications: Secondary | ICD-10-CM

## 2017-01-14 DIAGNOSIS — Z0001 Encounter for general adult medical examination with abnormal findings: Secondary | ICD-10-CM

## 2017-01-14 DIAGNOSIS — Z86010 Personal history of colonic polyps: Secondary | ICD-10-CM | POA: Diagnosis not present

## 2017-01-14 DIAGNOSIS — E785 Hyperlipidemia, unspecified: Secondary | ICD-10-CM | POA: Diagnosis not present

## 2017-01-14 MED ORDER — FENOFIBRATE 160 MG PO TABS: 160.0000 mg | ORAL_TABLET | Freq: Every day | ORAL | 6 refills | Status: DC

## 2017-01-14 NOTE — Progress Notes (Signed)
Subjective:    Patient ID: Gregory Jensen, male    DOB: Jun 29, 1956, 61 y.o.   MRN: [id number]  HPI  61 year old patient who is seen today for a preventive health examination He has a history of dyslipidemia.  Remains on statin therapy, but approximate one year ago.  Was given a trial off fenofibrate.  He remains on omega-3 fatty acids. Recent laboratory studies reviewed and revealed significantly elevated triglycerides. He has type 2 diabetes which has been stable. He has remote history of prostate cancer. No new concerns or complaints.  Patient did have colonoscopy one year ago that did reveal colonic polyps  Past Medical History:  Diagnosis Date  . Cancer ([place]) 4 years ago   Prostate Cancer   . Diabetes mellitus without complication ([place])   . Hyperlipidemia   . Sleep apnea   . Smoker      Social History   Social History  . Marital status: Divorced    Spouse name: N/A  . Number of children: N/A  . Years of education: N/A   Occupational History  . [employer]    Social History Main Topics  . Smoking status: Current Every Day Smoker    Packs/day: 1.00    Years: 40.00    Types: Cigarettes  . Smokeless tobacco: Never Used  . Alcohol use No  . Drug use: No  . Sexual activity: Not on file   Other Topics Concern  . Not on file   Social History Narrative  . No narrative on file    Past Surgical History:  Procedure Laterality Date  . BACK SURGERY  15 years ago  . COLONOSCOPY  2007 (at age 61)   pt does not know MD name/normal exam per pt.  Marland Kitchen PROSTATECTOMY  4 years ago    Family History  Problem Relation Age of Onset  . Diabetes Unknown        family hx  . Hypertension Sister   . Heart disease Sister   . Heart disease Father   . Alzheimer's disease Mother   . Hypertension Sister   . Colon cancer Neg Hx     No Known Allergies  Current Outpatient Prescriptions on File Prior to Visit  Medication Sig Dispense Refill  . aspirin 81 MG EC tablet  Take 81 mg by mouth daily.      Marland Kitchen atorvastatin (LIPITOR) 40 MG tablet TAKE 1 TABLET BY MOUTH EVERY DAY 90 tablet 0  . glucose blood (ONETOUCH VERIO) test strip 1 each by Other route daily as needed for other. Use as instructed 100 each 4  . metFORMIN (GLUCOPHAGE) 500 MG tablet TAKE 1 TABLET BY MOUTH TWICE DAILY WITH A MEAL 180 tablet 1  . Omega-3 Fatty Acids (FISH OIL) 1200 MG CAPS Take 1 capsule by mouth daily.    Glory Rosebush DELICA LANCETS 17B MISC 1 each by Does not apply route daily as needed. 100 each 4   No current facility-administered medications on file prior to visit.     BP 108/68 (BP Location: Left Arm, Patient Position: Sitting, Cuff Size: Normal)   Pulse 78   Temp 98.3 F (36.8 C) (Oral)   Ht 5\' 8"  (1.727 m)   Wt 191 lb 9.6 oz (86.9 kg)   SpO2 98%   BMI 29.13 kg/m     Review of Systems  Constitutional: Negative for appetite change, chills, fatigue and fever.  HENT: Negative for congestion, dental problem, ear pain, hearing loss, sore throat, tinnitus, trouble  swallowing and voice change.   Eyes: Negative for pain, discharge and visual disturbance.  Respiratory: Negative for cough, chest tightness, wheezing and stridor.   Cardiovascular: Negative for chest pain, palpitations and leg swelling.  Gastrointestinal: Negative for abdominal distention, abdominal pain, blood in stool, constipation, diarrhea, nausea and vomiting.  Genitourinary: Negative for difficulty urinating, discharge, flank pain, genital sores, hematuria and urgency.  Musculoskeletal: Negative for arthralgias, back pain, gait problem, joint swelling, myalgias and neck stiffness.  Skin: Negative for rash.  Neurological: Negative for dizziness, syncope, speech difficulty, weakness, numbness and headaches.  Hematological: Negative for adenopathy. Does not bruise/bleed easily.  Psychiatric/Behavioral: Negative for behavioral problems and dysphoric mood. The patient is not nervous/anxious.        Objective:     Physical Exam  Constitutional: He appears well-developed and well-nourished.  HENT:  Head: Normocephalic and atraumatic.  Right Ear: External ear normal.  Left Ear: External ear normal.  Nose: Nose normal.  Mouth/Throat: Oropharynx is clear and moist.  Eyes: Conjunctivae and EOM are normal. Pupils are equal, round, and reactive to light. No scleral icterus.  Neck: Normal range of motion. Neck supple. No JVD present. No thyromegaly present.  Cardiovascular: Regular rhythm, normal heart sounds and intact distal pulses.  Exam reveals no gallop and no friction rub.   No murmur heard. Pulmonary/Chest: Effort normal and breath sounds normal. He exhibits no tenderness.  Abdominal: Soft. Bowel sounds are normal. He exhibits no distension and no mass. There is no tenderness.  Genitourinary: Penis normal. Rectal exam shows guaiac negative stool.  Genitourinary Comments: Nonpalpable prostate  Musculoskeletal: Normal range of motion. He exhibits no edema or tenderness.  Lymphadenopathy:    He has no cervical adenopathy.  Neurological: He is alert. He has normal reflexes. No cranial nerve deficit. Coordination normal.  Skin: Skin is warm and dry. No rash noted.  Psychiatric: He has a normal mood and affect. His behavior is normal.          Assessment & Plan:   Preventive health examination Diabetes mellitus.  Continue metformin.  Hemoglobin A1c at [place] Mixed dyslipidemia.  We'll continue statin therapy and resume fenofibrate.  Weight loss and exercise encouraged History colonic polyps.  Will need follow-up colonoscopy in 5 years  Follow-up 6 months  Shawnae Leiva Pilar Plate

## 2017-01-14 NOTE — Patient Instructions (Addendum)
WE NOW OFFER   Gregory Jensen's FAST TRACK!!!  SAME DAY Appointments for ACUTE CARE  Such as: Sprains, Injuries, cuts, abrasions, rashes, muscle pain, joint pain, back pain Colds, flu, sore throats, headache, allergies, cough, fever  Ear pain, sinus and eye infections Abdominal pain, nausea, vomiting, diarrhea, upset stomach Animal/insect bites  3 Easy Ways to Schedule: Walk-In Scheduling Call in scheduling Mychart Sign-up: [email or web address]      It is important that you exercise regularly, at least 20 minutes 3 to 4 times per week.  If you develop chest pain or shortness of breath seek  medical attention.  You need to lose weight.  Consider a lower calorie diet and regular exercise.  Resume fenofibrate

## 2017-01-18 ENCOUNTER — Encounter: Payer: BLUE CROSS/BLUE SHIELD | Admitting: Internal Medicine

## 2017-02-14 ENCOUNTER — Other Ambulatory Visit: Payer: Self-pay | Admitting: Internal Medicine

## 2017-03-10 ENCOUNTER — Other Ambulatory Visit: Payer: Self-pay | Admitting: Internal Medicine

## 2017-05-05 ENCOUNTER — Encounter: Payer: Self-pay | Admitting: Internal Medicine

## 2017-06-15 ENCOUNTER — Other Ambulatory Visit: Payer: Self-pay | Admitting: Internal Medicine

## 2017-07-21 ENCOUNTER — Ambulatory Visit: Payer: BLUE CROSS/BLUE SHIELD | Admitting: Internal Medicine

## 2017-07-22 ENCOUNTER — Ambulatory Visit: Payer: BLUE CROSS/BLUE SHIELD | Admitting: Internal Medicine

## 2017-07-28 DIAGNOSIS — E119 Type 2 diabetes mellitus without complications: Secondary | ICD-10-CM | POA: Diagnosis not present

## 2017-07-28 LAB — HM DIABETES EYE EXAM

## 2017-08-05 ENCOUNTER — Encounter: Payer: Self-pay | Admitting: Internal Medicine

## 2017-09-14 ENCOUNTER — Other Ambulatory Visit: Payer: Self-pay | Admitting: Internal Medicine

## 2017-12-15 ENCOUNTER — Other Ambulatory Visit: Payer: Self-pay | Admitting: Internal Medicine

## 2017-12-15 NOTE — Telephone Encounter (Signed)
Patient scheduled an ov for more refills. No further action needed.

## 2018-01-14 ENCOUNTER — Other Ambulatory Visit: Payer: Self-pay | Admitting: Internal Medicine

## 2018-01-16 ENCOUNTER — Ambulatory Visit (INDEPENDENT_AMBULATORY_CARE_PROVIDER_SITE_OTHER): Payer: BLUE CROSS/BLUE SHIELD | Admitting: Internal Medicine

## 2018-01-16 ENCOUNTER — Telehealth: Payer: Self-pay | Admitting: Acute Care

## 2018-01-16 ENCOUNTER — Encounter: Payer: Self-pay | Admitting: Internal Medicine

## 2018-01-16 VITALS — BP 124/70 | HR 71 | Temp 98.1°F | Wt 192.0 lb

## 2018-01-16 DIAGNOSIS — Z125 Encounter for screening for malignant neoplasm of prostate: Secondary | ICD-10-CM

## 2018-01-16 DIAGNOSIS — E119 Type 2 diabetes mellitus without complications: Secondary | ICD-10-CM | POA: Diagnosis not present

## 2018-01-16 DIAGNOSIS — F17200 Nicotine dependence, unspecified, uncomplicated: Secondary | ICD-10-CM | POA: Diagnosis not present

## 2018-01-16 DIAGNOSIS — C61 Malignant neoplasm of prostate: Secondary | ICD-10-CM

## 2018-01-16 DIAGNOSIS — Z0000 Encounter for general adult medical examination without abnormal findings: Secondary | ICD-10-CM

## 2018-01-16 LAB — CBC WITH DIFFERENTIAL/PLATELET
Basophils Absolute: 0.1 10*3/uL (ref 0.0–0.1)
Basophils Relative: 0.9 % (ref 0.0–3.0)
Eosinophils Absolute: 0.2 10*3/uL (ref 0.0–0.7)
Eosinophils Relative: 2.4 % (ref 0.0–5.0)
HCT: 41.9 % (ref 39.0–52.0)
Hemoglobin: 15.1 g/dL (ref 13.0–17.0)
Lymphocytes Relative: 27.9 % (ref 12.0–46.0)
Lymphs Abs: 2.2 10*3/uL (ref 0.7–4.0)
MCHC: 35.9 g/dL (ref 30.0–36.0)
MCV: 91.7 fl (ref 78.0–100.0)
Monocytes Absolute: 0.4 10*3/uL (ref 0.1–1.0)
Monocytes Relative: 5.5 % (ref 3.0–12.0)
Neutro Abs: 5.1 10*3/uL (ref 1.4–7.7)
Neutrophils Relative %: 63.3 % (ref 43.0–77.0)
Platelets: 155 10*3/uL (ref 150.0–400.0)
RBC: 4.57 Mil/uL (ref 4.22–5.81)
RDW: 12.6 % (ref 11.5–15.5)
WBC: 8 10*3/uL (ref 4.0–10.5)

## 2018-01-16 LAB — COMPREHENSIVE METABOLIC PANEL
ALT: 34 U/L (ref 0–53)
AST: 23 U/L (ref 0–37)
Albumin: 4.6 g/dL (ref 3.5–5.2)
Alkaline Phosphatase: 72 U/L (ref 39–117)
BUN: 15 mg/dL (ref 6–23)
CO2: 24 mEq/L (ref 19–32)
Calcium: 9.6 mg/dL (ref 8.4–10.5)
Chloride: 104 mEq/L (ref 96–112)
Creatinine, Ser: 0.91 mg/dL (ref 0.40–1.50)
GFR: 89.48 mL/min (ref >60.00)
Glucose, Bld: 189 mg/dL — ABNORMAL HIGH (ref 70–99)
Potassium: 3.9 mEq/L (ref 3.5–5.1)
Sodium: 137 mEq/L (ref 135–145)
Total Bilirubin: 0.5 mg/dL (ref 0.2–1.2)
Total Protein: 7 g/dL (ref 6.0–8.3)

## 2018-01-16 LAB — MICROALBUMIN / CREATININE URINE RATIO
Creatinine,U: 106 mg/dL
Microalb Creat Ratio: 2.9 mg/g (ref 0.0–30.0)
Microalb, Ur: 3.1 mg/dL — ABNORMAL HIGH (ref 0.0–1.9)

## 2018-01-16 LAB — POCT GLYCOSYLATED HEMOGLOBIN (HGB A1C): Hemoglobin A1C: 7.7 % — AB (ref 4.0–5.6)

## 2018-01-16 LAB — PSA: PSA: 0 ng/mL — ABNORMAL LOW (ref 0.10–4.00)

## 2018-01-16 LAB — TSH: TSH: 4.24 u[IU]/mL (ref 0.35–4.50)

## 2018-01-16 LAB — LIPID PANEL
Cholesterol: 132 mg/dL (ref 0–200)
HDL: 17.5 mg/dL — ABNORMAL LOW (ref >39.00)
Total CHOL/HDL Ratio: 8
Triglycerides: 770 mg/dL — ABNORMAL HIGH (ref 0.0–149.0)

## 2018-01-16 LAB — LDL CHOLESTEROL, DIRECT: Direct LDL: 39 mg/dL

## 2018-01-16 MED ORDER — GLUCOSE BLOOD VI STRP: 1.0000 | ORAL_STRIP | Freq: Every day | 4 refills | Status: DC | PRN

## 2018-01-16 MED ORDER — FENOFIBRATE 160 MG PO TABS: 160.0000 mg | ORAL_TABLET | Freq: Every day | ORAL | 6 refills | Status: DC

## 2018-01-16 MED ORDER — METFORMIN HCL 500 MG PO TABS: ORAL_TABLET | ORAL | 6 refills | Status: DC

## 2018-01-16 MED ORDER — ONETOUCH DELICA LANCETS 33G MISC
1.0000 | Freq: Every day | 4 refills | Status: DC | PRN
Start: 2018-01-16 — End: 2022-05-18

## 2018-01-16 NOTE — Progress Notes (Signed)
Subjective:    Patient ID: Gregory Jensen, male    DOB: 12-22-1955, 62 y.o.   MRN: [id number]  HPI  Lab Results  Component Value Date   HGBA1C 6.8 (H) 01/11/2017   62 year old patient who is seen today for a preventive health examination  He has a history of diabetes but has not been seen in 1 year.  He did have a eye examination about 5 months ago.  For the past 6 to 8 months he states that he has had some balance issues but no falls His only other complaint is some stiffness involving the small joints of his hands.  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 Colonoscopy approximately 2 years ago  He continues to smoke tobacco 1 pack/day for almost 50 years.  He has a history of dyslipidemia  He states that he discontinued metformin therapy months ago.  Present medications include aspirin atorvastatin and fenofibrate only  Family history Father died at 40 of an MI mother died at 68 complications of dementia one brother with coronary artery disease no family history of cancer  Past Medical History:  Diagnosis Date  . Cancer ([place]) 4 years ago   Prostate Cancer   . Diabetes mellitus without complication ([place])   . Hyperlipidemia   . Sleep apnea   . Smoker      Social History   Socioeconomic History  . Marital status: Divorced    Spouse name: Not on file  . Number of children: Not on file  . Years of education: Not on file  . Highest education level: Not on file  Occupational History  . 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 Financial resource strain: Not on file  . Food insecurity:    Worry: Not on file    Inability: Not on file  . Transportation needs:    Medical: Not on file    Non-medical: Not on file  Tobacco Use  . Smoking status: Current Every Day Smoker    Packs/day: 1.00    Years: 40.00    Pack years: 40.00    Types: Cigarettes  . Smokeless tobacco: Never Used  Substance and Sexual  Activity  . Alcohol use: No    Alcohol/week: 0.0 oz  . Drug use: No  . Sexual activity: Not on file  Lifestyle  . Physical activity:    Days per week: Not on file    Minutes per session: Not on file  . Stress: Not on file  Relationships  . Social connections:    Talks on phone: Not on file    Gets together: Not on file    Attends religious service: Not on file    Active member of club or organization: Not on file    Attends meetings of clubs or organizations: Not on file    Relationship status: Not on file  . Intimate partner violence:    Fear of current or ex partner: Not on file    Emotionally abused: Not on file    Physically abused: Not on file    Forced sexual activity: Not on file  Other Topics Concern  . Not on file  Social History Narrative  . Not on file    Past Surgical History:  Procedure Laterality Date  . BACK SURGERY  15 years ago  . COLONOSCOPY  2007 (at age 61)   pt does not know MD name/normal exam  per pt.  Marland Kitchen PROSTATECTOMY  4 years ago    Family History  Problem Relation Age of Onset  . Diabetes Unknown        family hx  . Hypertension Sister   . Heart disease Sister   . Heart disease Father   . Alzheimer's disease Mother   . Hypertension Sister   . Colon cancer Neg Hx     No Known Allergies  Current Outpatient Medications on File Prior to Visit  Medication Sig Dispense Refill  . aspirin 81 MG EC tablet Take 81 mg by mouth daily.      Marland Kitchen atorvastatin (LIPITOR) 40 MG tablet TAKE 1 TABLET BY MOUTH EVERY DAY 30 tablet 0  . fenofibrate 160 MG tablet Take 1 tablet (160 mg total) by mouth daily. 90 tablet 6  . glucose blood (ONETOUCH VERIO) test strip 1 each by Other route daily as needed for other. Use as instructed (Patient not taking: Reported on 01/16/2018) 100 each 4  . metFORMIN (GLUCOPHAGE) 500 MG tablet TAKE 1 TABLET BY MOUTH TWICE DAILY WITH A MEAL (Patient not taking: Reported on 01/16/2018) 180 tablet 0  . Omega-3 Fatty Acids (FISH OIL) 1200  MG CAPS Take 1 capsule by mouth daily.    Glory Rosebush DELICA LANCETS 18H MISC 1 each by Does not apply route daily as needed. (Patient not taking: Reported on 01/16/2018) 100 each 4   No current facility-administered medications on file prior to visit.     BP 124/70 (BP Location: Right Arm, Patient Position: Sitting, Cuff Size: Large)   Pulse 71   Temp 98.1 F (36.7 C) (Oral)   Wt 192 lb (87.1 kg)   SpO2 97%   BMI 29.19 kg/m      Review of Systems  Constitutional: Negative for activity change, appetite change, chills, fatigue and fever.  HENT: Negative for congestion, dental problem, ear pain, hearing loss, mouth sores, rhinorrhea, sinus pressure, sneezing, tinnitus, trouble swallowing and voice change.   Eyes: Negative for photophobia, pain, redness and visual disturbance.  Respiratory: Negative for apnea, cough, choking, chest tightness, shortness of breath and wheezing.   Cardiovascular: Negative for chest pain, palpitations and leg swelling.  Gastrointestinal: Negative for abdominal distention, abdominal pain, anal bleeding, blood in stool, constipation, diarrhea, nausea, rectal pain and vomiting.  Genitourinary: Negative for decreased urine volume, difficulty urinating, discharge, dysuria, flank pain, frequency, genital sores, hematuria, penile swelling, scrotal swelling, testicular pain and urgency.  Musculoskeletal: Positive for arthralgias and gait problem. Negative for back pain, joint swelling, myalgias, neck pain and neck stiffness.  Skin: Negative for color change, rash and wound.  Neurological: Positive for numbness. Negative for dizziness, tremors, seizures, syncope, facial asymmetry, speech difficulty, weakness, light-headedness and headaches.  Hematological: Negative for adenopathy. Does not bruise/bleed easily.  Psychiatric/Behavioral: Negative for agitation, behavioral problems, confusion, decreased concentration, dysphoric mood, hallucinations, self-injury, sleep  disturbance and suicidal ideas. The patient is not nervous/anxious.        Objective:   Physical Exam  Constitutional: He appears well-developed and well-nourished.  HENT:  Head: Normocephalic and atraumatic.  Right Ear: External ear normal.  Left Ear: External ear normal.  Nose: Nose normal.  Mouth/Throat: Oropharynx is clear and moist.  Eyes: Pupils are equal, round, and reactive to light. Conjunctivae and EOM are normal. No scleral icterus.  Neck: Normal range of motion. Neck supple. No JVD present. No thyromegaly present.  Cardiovascular: Regular rhythm, normal heart sounds and intact distal pulses. Exam reveals no gallop and no  friction rub.  No murmur heard. Dorsalis pedis pulses faint.  Posterior tibial pulses full  Pulmonary/Chest: Effort normal and breath sounds normal. He exhibits no tenderness.  Abdominal: Soft. Bowel sounds are normal. He exhibits no distension and no mass. There is no tenderness.  Small umbilical hernia  Genitourinary: Penis normal. Rectal exam shows guaiac negative stool.  Genitourinary Comments: Uncircumcised Prostate absent Hematest negative  Musculoskeletal: Normal range of motion. He exhibits no edema or tenderness.  Lymphadenopathy:    He has no cervical adenopathy.  Neurological: He is alert. He has normal reflexes. No cranial nerve deficit. Coordination normal.  Skin: Skin is warm and dry. No rash noted.  Psychiatric: He has a normal mood and affect. His behavior is normal.          Assessment & Plan:  Preventive health examination Diabetes mellitus.  Will resume metformin therapy follow-up in 3 months encouraged Dyslipidemia will review lipid profile.  Continue atorvastatin and fenofibrate Diabetic peripheral neuropathy Ongoing tobacco use.  Will order low-dose chest CT scanning History of prostate cancer.  Review PSA  Follow-up 3 months  Genavive Kubicki Pilar Plate

## 2018-01-16 NOTE — Patient Instructions (Signed)
Return in 3 months for follow-up Low-dose chest CT scanning to rule out early lung cancer  Please check your hemoglobin A1c every 3 months   Smoking tobacco is very bad for your health. You should stop smoking immediately.

## 2018-01-17 LAB — HEPATITIS C ANTIBODY
Hepatitis C Ab: NONREACTIVE
SIGNAL TO CUT-OFF: 0.02 (ref <1.00)

## 2018-01-17 NOTE — Telephone Encounter (Signed)
Will route to Denise 

## 2018-01-18 NOTE — Telephone Encounter (Signed)
Spoke with pt and discussed lung cancer screening,  Pt advised that [employer] does not cover this and was given the out of pocket cost options.  Pt would like to think about it and have me call him back next week.  Will defer until that time.  Will close this message and refer to referral notes.

## 2018-01-23 ENCOUNTER — Other Ambulatory Visit: Payer: Self-pay | Admitting: Acute Care

## 2018-01-23 DIAGNOSIS — F17210 Nicotine dependence, cigarettes, uncomplicated: Secondary | ICD-10-CM

## 2018-01-23 DIAGNOSIS — Z122 Encounter for screening for malignant neoplasm of respiratory organs: Secondary | ICD-10-CM

## 2018-02-03 ENCOUNTER — Encounter: Payer: Self-pay | Admitting: Acute Care

## 2018-02-03 ENCOUNTER — Ambulatory Visit: Payer: BLUE CROSS/BLUE SHIELD | Admitting: Acute Care

## 2018-02-03 ENCOUNTER — Ambulatory Visit
Admission: RE | Admit: 2018-02-03 | Discharge: 2018-02-03 | Disposition: A | Discharge status: Home / Self Care | Payer: BLUE CROSS/BLUE SHIELD | Source: Ambulatory Visit | Attending: Acute Care | Admitting: Acute Care

## 2018-02-03 DIAGNOSIS — Z122 Encounter for screening for malignant neoplasm of respiratory organs: Secondary | ICD-10-CM

## 2018-02-03 DIAGNOSIS — F17210 Nicotine dependence, cigarettes, uncomplicated: Secondary | ICD-10-CM | POA: Diagnosis not present

## 2018-02-03 NOTE — Progress Notes (Signed)
Shared Decision Making Visit Lung Cancer Screening Program [phone])   Eligibility:  Age 62 y.o.  Pack Years Smoking History Calculation 47 pack year smoking history (# packs/per year x # years smoked)  Recent History of coughing up blood  no  Unexplained weight loss? no ( >Than 15 pounds within the last 6 months )  Prior History Lung / other cancer no (Diagnosis within the last 5 years already requiring surveillance chest CT Scans).  Smoking Status Current Smoker  Former Smokers: Years since quit: NA  Quit Date: NA  Visit Components:  Discussion included one or more decision making aids. yes  Discussion included risk/benefits of screening. yes  Discussion included potential follow up diagnostic testing for abnormal scans. yes  Discussion included meaning and risk of over diagnosis. yes  Discussion included meaning and risk of False Positives. yes  Discussion included meaning of total radiation exposure. yes  Counseling Included:  Importance of adherence to annual lung cancer LDCT screening. yes  Impact of comorbidities on ability to participate in the program. yes  Ability and willingness to under diagnostic treatment. yes  Smoking Cessation Counseling:  Current Smokers:   Discussed importance of smoking cessation. yes  Information about tobacco cessation classes and interventions provided to patient. yes  Patient provided with "ticket" for LDCT Scan. yes  Symptomatic Patient. no  Counseling  Diagnosis Code: Tobacco Use Z72.0  Asymptomatic Patient yes  Counseling (Intermediate counseling: > three minutes counseling) B3419  Former Smokers:   Discussed the importance of maintaining cigarette abstinence. yes  Diagnosis Code: Personal History of Nicotine Dependence. F79.024  Information about tobacco cessation classes and interventions provided to patient. Yes  Patient provided with "ticket" for LDCT Scan. yes  Written Order for Lung Cancer  Screening with LDCT placed in Epic. Yes (CT Chest Lung Cancer Screening Low Dose W/O CM) [id number] Z12.2-Screening of respiratory organs Z87.891-Personal history of nicotine dependence  I have spent 25 minutes of face to face time with Mr. Castilleja discussing the risks and benefits of lung cancer screening. We viewed a power point together that explained in detail the above noted topics. We paused at intervals to allow for questions to be asked and answered to ensure understanding.We discussed that the single most powerful action that he can take to decrease his risk of developing lung cancer is to quit smoking. We discussed whether or not he is ready to commit to setting a quit date. We discussed options for tools to aid in quitting smoking including nicotine replacement therapy, non-nicotine medications, support groups, Quit Smart classes, and behavior modification. We discussed that often times setting smaller, more achievable goals, such as eliminating 1 cigarette a day for a week and then 2 cigarettes a day for a week can be helpful in slowly decreasing the number of cigarettes smoked. This allows for a sense of accomplishment as well as providing a clinical benefit. I gave him the " Be Stronger Than Your Excuses" card with contact information for community resources, classes, free nicotine replacement therapy, and access to mobile apps, text messaging, and on-line smoking cessation help. I have also given him my card and contact information in the event he needs to contact me. We discussed the time and location of the scan, and that either Doroteo Glassman RN or I will call with the results within 24-48 hours of receiving them. I have offered him  a copy of the power point we viewed  as a resource in the event they need reinforcement of  the concepts we discussed today in the office. The patient verbalized understanding of all of  the above and had no further questions upon leaving the office. They have my  contact information in the event they have any further questions.  I spent 3 minutes counseling on smoking cessation and the health risks of continued tobacco abuse.  I explained to the patient that there has been a high incidence of coronary artery disease noted on these exams. I explained that this is a non-gated exam therefore degree or severity cannot be determined. This patient is currently on statin therapy. I have asked the patient to follow-up with their PCP regarding any incidental finding of coronary artery disease and management with diet or medication as their PCP  feels is clinically indicated. The patient verbalized understanding of the above and had no further questions upon completion of the visit.        Magdalen Spatz, NP 02/03/2018 4:24 PM

## 2018-02-08 ENCOUNTER — Other Ambulatory Visit: Payer: Self-pay | Admitting: Acute Care

## 2018-02-08 DIAGNOSIS — F17210 Nicotine dependence, cigarettes, uncomplicated: Secondary | ICD-10-CM

## 2018-02-08 DIAGNOSIS — Z122 Encounter for screening for malignant neoplasm of respiratory organs: Secondary | ICD-10-CM

## 2018-02-10 ENCOUNTER — Other Ambulatory Visit: Payer: Self-pay | Admitting: Internal Medicine

## 2018-03-12 ENCOUNTER — Other Ambulatory Visit: Payer: Self-pay | Admitting: Internal Medicine

## 2018-04-09 ENCOUNTER — Other Ambulatory Visit: Payer: Self-pay | Admitting: Internal Medicine

## 2018-04-11 ENCOUNTER — Other Ambulatory Visit: Payer: Self-pay | Admitting: Internal Medicine

## 2018-04-19 ENCOUNTER — Ambulatory Visit: Payer: BLUE CROSS/BLUE SHIELD | Admitting: Internal Medicine

## 2018-04-19 ENCOUNTER — Encounter: Payer: Self-pay | Admitting: Internal Medicine

## 2018-04-19 VITALS — BP 120/62 | HR 70 | Temp 98.0°F | Wt 184.2 lb

## 2018-04-19 DIAGNOSIS — D2330 Other benign neoplasm of skin of unspecified part of face: Secondary | ICD-10-CM

## 2018-04-19 DIAGNOSIS — E782 Mixed hyperlipidemia: Secondary | ICD-10-CM

## 2018-04-19 DIAGNOSIS — F17200 Nicotine dependence, unspecified, uncomplicated: Secondary | ICD-10-CM | POA: Diagnosis not present

## 2018-04-19 DIAGNOSIS — I2510 Atherosclerotic heart disease of native coronary artery without angina pectoris: Secondary | ICD-10-CM | POA: Diagnosis not present

## 2018-04-19 DIAGNOSIS — E119 Type 2 diabetes mellitus without complications: Secondary | ICD-10-CM

## 2018-04-19 DIAGNOSIS — Z0000 Encounter for general adult medical examination without abnormal findings: Secondary | ICD-10-CM

## 2018-04-19 DIAGNOSIS — I2584 Coronary atherosclerosis due to calcified coronary lesion: Secondary | ICD-10-CM

## 2018-04-19 LAB — POCT GLYCOSYLATED HEMOGLOBIN (HGB A1C): Hemoglobin A1C: 6 % — AB (ref 4.0–5.6)

## 2018-04-19 MED ORDER — METFORMIN HCL 500 MG PO TABS: ORAL_TABLET | ORAL | 6 refills | Status: DC

## 2018-04-19 MED ORDER — FENOFIBRATE 160 MG PO TABS: 160.0000 mg | ORAL_TABLET | Freq: Every day | ORAL | 6 refills | Status: DC

## 2018-04-19 MED ORDER — ATORVASTATIN CALCIUM 40 MG PO TABS: 40.0000 mg | ORAL_TABLET | Freq: Every day | ORAL | 4 refills | Status: DC

## 2018-04-19 NOTE — Patient Instructions (Signed)
Smoking tobacco is very bad for your health. You should stop smoking immediately.   Please check your hemoglobin A1c every 3-6 months

## 2018-04-19 NOTE — Progress Notes (Signed)
Subjective:    Patient ID: Gregory Jensen, male    DOB: Jul 31, 1956, 62 y.o.   MRN: [id number]  HPI  01/16/18.  62 year old patient who is seen today for a preventive health examination  He has a history of diabetes but has not been seen in 1 year.  He did have a eye examination about 5 months ago.  For the past 6 to 8 months he states that he has had some balance issues but no falls His only other complaint is some stiffness involving the small joints of his hands.  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 Colonoscopy approximately 2 years ago  He continues to smoke tobacco 1 pack/day for almost 50 years.  He has a history of dyslipidemia  He states that he discontinued metformin therapy months ago.  04/19/18.  Patient seen today for follow-up of type 2 diabetes.  3 months ago he was resumed on metformin which she has taken faithfully.  He states his highest blood sugar over the past 3 months was 145.  Blood sugars generally run in the 120 range He had a low-dose chest CT which revealed emphysematous changes as well as coronary artery and generalized atherosclerotic changes. He continues to smoke 1 pack/day He has dyslipidemia and remains on statin and fibrillate therapy  Past Medical History:  Diagnosis Date  . Cancer ([place]) 4 years ago   Prostate Cancer   . Diabetes mellitus without complication ([place])   . Hyperlipidemia   . Sleep apnea   . Smoker      Social History   Socioeconomic History  . Marital status: Divorced    Spouse name: Not on file  . Number of children: Not on file  . Years of education: Not on file  . Highest education level: Not on file  Occupational History  . 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 Financial resource strain: Not on file  . Food insecurity:    Worry: Not on file    Inability: Not on file  . Transportation needs:    Medical: Not on file    Non-medical: Not on  file  Tobacco Use  . Smoking status: Current Every Day Smoker    Packs/day: 1.00    Years: 47.00    Pack years: 47.00    Types: Cigarettes  . Smokeless tobacco: Never Used  Substance and Sexual Activity  . Alcohol use: No    Alcohol/week: 0.0 standard drinks  . Drug use: No  . Sexual activity: Not on file  Lifestyle  . Physical activity:    Days per week: Not on file    Minutes per session: Not on file  . Stress: Not on file  Relationships  . Social connections:    Talks on phone: Not on file    Gets together: Not on file    Attends religious service: Not on file    Active member of club or organization: Not on file    Attends meetings of clubs or organizations: Not on file    Relationship status: Not on file  . Intimate partner violence:    Fear of current or ex partner: Not on file    Emotionally abused: Not on file    Physically abused: Not on file    Forced sexual activity: Not on file  Other Topics Concern  . Not on file  Social History Narrative  . Not on  file    Past Surgical History:  Procedure Laterality Date  . BACK SURGERY  15 years ago  . COLONOSCOPY  2007 (at age 45)   pt does not know MD name/normal exam per pt.  Marland Kitchen PROSTATECTOMY  4 years ago    Family History  Problem Relation Age of Onset  . Diabetes Unknown        family hx  . Hypertension Sister   . Heart disease Sister   . Heart disease Father   . Alzheimer's disease Mother   . Hypertension Sister   . Colon cancer Neg Hx     No Known Allergies  Current Outpatient Medications on File Prior to Visit  Medication Sig Dispense Refill  . aspirin 81 MG EC tablet Take 81 mg by mouth daily.      Marland Kitchen atorvastatin (LIPITOR) 40 MG tablet TAKE 1 TABLET BY MOUTH EVERY DAY 30 tablet 0  . fenofibrate 160 MG tablet Take 1 tablet (160 mg total) by mouth daily. 90 tablet 6  . glucose blood (ONETOUCH VERIO) test strip 1 each by Other route daily as needed for other. Use as instructed 100 each 4  .  metFORMIN (GLUCOPHAGE) 500 MG tablet TAKE 1 TABLET BY MOUTH TWICE DAILY WITH A MEAL 180 tablet 6  . Omega-3 Fatty Acids (FISH OIL) 1200 MG CAPS Take 1 capsule by mouth daily.    Glory Rosebush DELICA LANCETS 41D MISC 1 each by Does not apply route daily as needed. 100 each 4   No current facility-administered medications on file prior to visit.     BP 120/62 (BP Location: Right Arm, Patient Position: Sitting, Cuff Size: Normal)   Pulse 70   Temp 98 F (36.7 C) (Oral)   Wt 184 lb 3.2 oz (83.6 kg)   SpO2 96%   BMI 28.01 kg/m      Review of Systems  Constitutional: Negative for appetite change, chills, fatigue and fever.  HENT: Negative for congestion, dental problem, ear pain, hearing loss, sore throat, tinnitus, trouble swallowing and voice change.   Eyes: Negative for pain, discharge and visual disturbance.  Respiratory: Negative for cough, chest tightness, wheezing and stridor.   Cardiovascular: Negative for chest pain, palpitations and leg swelling.  Gastrointestinal: Negative for abdominal distention, abdominal pain, blood in stool, constipation, diarrhea, nausea and vomiting.  Genitourinary: Negative for difficulty urinating, discharge, flank pain, genital sores, hematuria and urgency.  Musculoskeletal: Negative for arthralgias, back pain, gait problem, joint swelling, myalgias and neck stiffness.  Skin: Negative for rash.  Neurological: Negative for dizziness, syncope, speech difficulty, weakness, numbness and headaches.  Hematological: Negative for adenopathy. Does not bruise/bleed easily.  Psychiatric/Behavioral: Negative for behavioral problems and dysphoric mood. The patient is not nervous/anxious.        Objective:   Physical Exam  Constitutional: He is oriented to person, place, and time. He appears well-developed and well-nourished. No distress.  Blood pressure 116/60  HENT:  Head: Normocephalic.  Right Ear: External ear normal.  Left Ear: External ear normal.    Eyes: Conjunctivae and EOM are normal.  Neck: Normal range of motion.  Cardiovascular: Normal rate and normal heart sounds.  Pulmonary/Chest: Breath sounds normal.  Abdominal: Bowel sounds are normal.  Musculoskeletal: Normal range of motion. He exhibits no edema or tenderness.  Neurological: He is alert and oriented to person, place, and time.  Skin:  5-6 mm scaly nonspecific papule involving the left infra auricular area  Psychiatric: He has a normal mood and affect.  His behavior is normal.          Assessment & Plan:   Diabetes mellitus.  Hemoglobin A1c today 6.0 Dyslipidemia History of prostate cancer Coronary artery calcification Radiographic evidence of emphysema Ongoing tobacco use Papule left face region.  Rule out BCE.  Set up to see dermatology  No change in medical regimen Cessation of tobacco use strongly encouraged  Follow-up 4 months.  Check lipid profile at that time Diabetic eye examination will be due prior to his next visit All medications updated  Marletta Lor

## 2018-06-20 DIAGNOSIS — L570 Actinic keratosis: Secondary | ICD-10-CM | POA: Diagnosis not present

## 2018-06-20 DIAGNOSIS — D485 Neoplasm of uncertain behavior of skin: Secondary | ICD-10-CM | POA: Diagnosis not present

## 2018-06-20 DIAGNOSIS — L821 Other seborrheic keratosis: Secondary | ICD-10-CM | POA: Diagnosis not present

## 2018-09-21 ENCOUNTER — Encounter: Payer: Self-pay | Admitting: Family Medicine

## 2018-09-21 ENCOUNTER — Ambulatory Visit (INDEPENDENT_AMBULATORY_CARE_PROVIDER_SITE_OTHER): Payer: BLUE CROSS/BLUE SHIELD

## 2018-09-21 ENCOUNTER — Ambulatory Visit: Payer: BLUE CROSS/BLUE SHIELD | Admitting: Family Medicine

## 2018-09-21 VITALS — BP 124/64 | HR 68 | Temp 98.3°F | Ht 68.0 in | Wt 192.8 lb

## 2018-09-21 DIAGNOSIS — E119 Type 2 diabetes mellitus without complications: Secondary | ICD-10-CM

## 2018-09-21 DIAGNOSIS — R2689 Other abnormalities of gait and mobility: Secondary | ICD-10-CM

## 2018-09-21 DIAGNOSIS — R29898 Other symptoms and signs involving the musculoskeletal system: Secondary | ICD-10-CM

## 2018-09-21 DIAGNOSIS — M25531 Pain in right wrist: Secondary | ICD-10-CM | POA: Diagnosis not present

## 2018-09-21 DIAGNOSIS — C61 Malignant neoplasm of prostate: Secondary | ICD-10-CM

## 2018-09-21 DIAGNOSIS — I2584 Coronary atherosclerosis due to calcified coronary lesion: Secondary | ICD-10-CM

## 2018-09-21 DIAGNOSIS — E785 Hyperlipidemia, unspecified: Secondary | ICD-10-CM

## 2018-09-21 DIAGNOSIS — I2510 Atherosclerotic heart disease of native coronary artery without angina pectoris: Secondary | ICD-10-CM

## 2018-09-21 DIAGNOSIS — M1811 Unilateral primary osteoarthritis of first carpometacarpal joint, right hand: Secondary | ICD-10-CM | POA: Diagnosis not present

## 2018-09-21 DIAGNOSIS — E1169 Type 2 diabetes mellitus with other specified complication: Secondary | ICD-10-CM

## 2018-09-21 NOTE — Patient Instructions (Signed)
It was very nice to see you today!  We will get an x-ray of the wrist to make sure there is not any arthritis or anything else going on.  I would like for you to see a neurologist for your balance and weakness.  Please come back to see me in June for your physical with blood work, or sooner as needed.  Take care, Dr [name]

## 2018-09-21 NOTE — Assessment & Plan Note (Signed)
Stable.  Continue Lipitor 40 mg daily and fenofibrate 160 mg daily.  Check lipid panel next blood draw.

## 2018-09-21 NOTE — Assessment & Plan Note (Signed)
Stable.  Check PSA with next blood draw.

## 2018-09-21 NOTE — Progress Notes (Signed)
Chief Complaint:  Gregory Jensen is a 63 y.o. male who presents today with a chief complaint of wrist pain and to transfer care to this office.   Assessment/Plan:  Right wrist pain Unclear etiology.  Will check x-ray to rule out bony abnormality or osteoarthritis.  Possibly could be component of ulnar neuropathy.  Depending on results of x-ray would consider referral to orthopedics.  Imbalance /lower extremity weakness Patient unable to resist ankle dorsiflexion or toe extension.  Unsure if this is a progression of his diabetic neuropathy or a new issue.  Given that symptoms are relatively stable and have been present for several months, do not think that he needs emergent evaluation at this time.  Will place referral to neurology for further evaluation.  Discussed strict reasons to return to care and seek emergent care.  Prostate cancer Stable.  Check PSA with next blood draw.  Dyslipidemia associated with type 2 diabetes mellitus (HCC) Stable.  Continue Lipitor 40 mg daily and fenofibrate 160 mg daily.  Check lipid panel next blood draw.  Diabetes mellitus without complication (HCC) Last H0W of 6.0.  Continue metformin 500 mg twice daily.  He will follow-up in about 4 months.  Recheck A1c at that time.  Coronary artery calcification Continue aspirin and statin.     Subjective:  HPI:  Right Wrist Pain Started several months ago. Radiates up arm. Worse with certain motions. No swelling.  No treatments tried. No history of trauma or obvious precipitating events.   Balance Issues Started a few years ago. Getting worse. No dizziness. No syncope. No weakness or numbness. Has not fallen. Feels like his feet flopping. Worse with certain footware.  Also started several months ago.  His stable, chronic medical conditions are outlined below:  # T2DM  - On Metformin 500mg  twice daily and tolerating well.  - ROS: No polyuria or polydipsia  # Dyslipidemia / Coronary Artery  Disease - On lipitor 40mg  daily, aspirin 81mg  daily, and fenofibrate 160mg  daily. Tolerating well without side effects  # Nicotine Dependence - Currently smoking about a pack per day  % Prostate Cancer s/p prostatectomy  - Follows with urology - Dr Risa Grill  ROS: Per HPI  PMH: He reports that he has been smoking cigarettes. He has a 47.00 pack-year smoking history. He has never used smokeless tobacco. He reports that he does not drink alcohol or use drugs.      Objective:  Physical Exam: BP 124/64 (BP Location: Left Arm, Patient Position: Sitting, Cuff Size: Normal)   Pulse 68   Temp 98.3 F (36.8 C) (Oral)   Ht 5\' 8"  (1.727 m)   Wt 192 lb 12.8 oz (87.5 kg)   SpO2 98%   BMI 29.32 kg/m   Gen: NAD, resting comfortably CV: Regular rate and rhythm with no murmurs appreciated Pulm: Normal work of breathing, clear to auscultation bilaterally with no crackles, wheezes, or rhonchi GI: Normal bowel sounds present. Soft, Nontender, Nondistended. MSK:  -Right upper extremity: No deformities.  Mildly tender on palpation along distal ulna.  Tinel sign negative at cubital tunnel.  Neurovascular intact distally. Skin: Warm, dry Neuro: Cranial nerves II through XII intact.  Finger-nose-finger testing intact bilaterally.  Strength 5 out of 5 in upper extremities throughout.  He has 4 out of 5 strength with ankle dorsiflexion and great toe extension.  Otherwise 5 out of 5 strength throughout.  Decreased sensation light touch intact throughout lower extremities as well. Psych: Normal affect and thought content  Algis Greenhouse. [name], MD 09/21/2018 2:12 PM

## 2018-09-21 NOTE — Assessment & Plan Note (Signed)
Last A1c of 6.0.  Continue metformin 500 mg twice daily.  He will follow-up in about 4 months.  Recheck A1c at that time.

## 2018-09-21 NOTE — Assessment & Plan Note (Signed)
Continue aspirin and statin. 

## 2018-09-22 ENCOUNTER — Encounter: Payer: Self-pay | Admitting: Neurology

## 2018-09-25 NOTE — Progress Notes (Signed)
Please inform patient of the following:  His xray showed that he has slight arthritis but everything else was normal. Recommend referral to sports med or orthopedics if still bothersome.  Gregory Jensen. [name], MD 09/25/2018 1:39 PM

## 2018-10-24 NOTE — Progress Notes (Signed)
[facility] Clinic Note - Initial Visit   Date: 10/25/18  Gregory Jensen MRN: [id number] DOB: 1955-11-22   Dear Dr. [name]:  Thank you for your kind referral of Gregory Jensen for consultation of bilateral feet weakness. Although his history is well known to you, please allow [place] to reiterate it for the purpose of our medical record. The patient was accompanied to the clinic by self.     History of Present Illness: Gregory Jensen is a 63 y.o. right-handed Caucasian male with diabetes mellitus, CAD, hyperlipidemia, history of prostate cancer, and tobacco use presenting for evaluation of bilateral feet weakness.   Starting around 2015, he began having numbness of the toes which progressed into the feet.  He also noticed weakness of the toes, such that he cannot flex or extend the toes. He does not have problems manipulating car pedals.  He complains of imbalance, often noticing that his feet will hit the floor hard when walking.  He walks unassisted and has not had any falls.  Diabetes is well-controlled, last HbA1c 6.0  He does have chronic low back pain, nothing that radiates into the legs.  He admits to getting tired after prolonged walking, but does not have achy leg pain.  He had noticed that when he hyperextends his neck to look up for a period of time and then returns head to neutral position, he develops severe debilitating low back pain.   Out-side paper records, electronic medical record, and images have been reviewed where available and summarized as:  Lab Results  Component Value Date   HGBA1C 6.0 (A) 04/19/2018   Lab Results  Component Value Date   TSH 4.24 01/16/2018     Past Medical History:  Diagnosis Date  . Cancer ([place]) 4 years ago   Prostate Cancer   . Diabetes mellitus without complication ([place])   . Hyperlipidemia   . Sleep apnea   . Smoker     Past Surgical History:  Procedure Laterality Date  . BACK SURGERY   15 years ago  . COLONOSCOPY  2007 (at age 11)   pt does not know MD name/normal exam per pt.  Marland Kitchen PROSTATECTOMY  4 years ago     Medications:  Outpatient Encounter Medications as of 10/25/2018  Medication Sig  . aspirin 81 MG EC tablet Take 81 mg by mouth daily.    Marland Kitchen atorvastatin (LIPITOR) 40 MG tablet Take 1 tablet (40 mg total) by mouth daily.  . fenofibrate 160 MG tablet Take 1 tablet (160 mg total) by mouth daily.  Marland Kitchen glucose blood (ONETOUCH VERIO) test strip 1 each by Other route daily as needed for other. Use as instructed  . metFORMIN (GLUCOPHAGE) 500 MG tablet TAKE 1 TABLET BY MOUTH TWICE DAILY WITH A MEAL  . ONETOUCH DELICA LANCETS 52W MISC 1 each by Does not apply route daily as needed.   No facility-administered encounter medications on file as of 10/25/2018.     Allergies: No Known Allergies  Family History: Family History  Problem Relation Age of Onset  . Heart disease Father   . Alzheimer's disease Mother   . Diabetes Other        family hx  . Hypertension Sister   . Heart disease Sister   . Hypertension Sister   . Stomach cancer Brother   . Colon cancer Neg Hx     Social History: Social History   Tobacco Use  . Smoking status: Current Every Day Smoker  Packs/day: 1.25    Years: 47.00    Pack years: 58.75    Types: Cigarettes  . Smokeless tobacco: Never Used  Substance Use Topics  . Alcohol use: No    Alcohol/week: 0.0 standard drinks  . Drug use: No   Social History   Social History Narrative   Lives with wife. Has one son who lives in [place].    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      Review of Systems:  CONSTITUTIONAL: No fevers, chills, night sweats, or weight loss.   EYES: No visual changes or eye pain ENT: No hearing changes.  No history of nose bleeds.   RESPIRATORY: No cough, wheezing and shortness of breath.   CARDIOVASCULAR: Negative for chest pain, and palpitations.   GI: Negative for  abdominal discomfort, blood in stools or black stools.  No recent change in bowel habits.   GU:  No history of incontinence.   MUSCLOSKELETAL: No history of joint pain or swelling.  No myalgias.   SKIN: Negative for lesions, rash, and itching.   HEMATOLOGY/ONCOLOGY: Negative for prolonged bleeding, bruising easily, and swollen nodes.  No history of cancer.   ENDOCRINE: Negative for cold or heat intolerance, polydipsia or goiter.   PSYCH:  No depression or anxiety symptoms.   NEURO: As Above.   Vital Signs:  BP 134/64   Pulse 72   Ht '5\' 8"'  (1.727 m)   Wt 190 lb (86.2 kg)   SpO2 97%   BMI 28.89 kg/m    General Medical Exam:   General:  Well appearing, comfortable.   Eyes/ENT: see cranial nerve examination.   Neck:   No carotid bruits. Respiratory:  Clear to auscultation, good air entry bilaterally.   Cardiac:  Regular rate and rhythm, no murmur.   Extremities:  No deformities, edema, or skin discoloration.  Skin:  No rashes or lesions.  Neurological Exam: MENTAL STATUS including orientation to time, place, person, recent and remote memory, attention span and concentration, language, and fund of knowledge is normal.  Speech is not dysarthric.  CRANIAL NERVES: II:  No visual field defects.  Unremarkable fundi.   III-IV-VI: Pupils equal round and reactive to light.  Normal conjugate, extra-ocular eye movements in all directions of gaze.  No nystagmus.  No ptosis.   V:  Normal facial sensation.    VII:  Normal facial symmetry and movements.   VIII:  Normal hearing and vestibular function.   IX-X:  Normal palatal movement.   XI:  Normal shoulder shrug and head rotation.   XII:  Normal tongue strength and range of motion, no deviation or fasciculation.  MOTOR:  Moderate atrophy of the lower legs (TA and gastrocnemius) bilaterally.  No fasciculations or abnormal movements.  No pronator drift.   Upper Extremity:  Right  Left  Deltoid  5/5   5/5   Biceps  5/5   5/5   Triceps  5/5    5/5   Infraspinatus 5/5  5/5  Medial pectoralis 5/5  5/5  Wrist extensors  5/5   5/5   Wrist flexors  5/5   5/5   Finger extensors  5/5   5/5   Finger flexors  5/5   5/5   Dorsal interossei  5/5   5/5   Abductor pollicis  5/5   5/5   Tone (Ashworth scale)  0  0   Lower Extremity:  Right  Left  Hip flexors  5/5   5/5   Hip extensors  5/5   5/5   Adductor 5/5  5/5  Abductor 5/5  5/5  Knee flexors  5/5   5/5   Knee extensors  5/5   5/5   Dorsiflexors  4/5   4/5   Plantarflexors  4/5   4/5   Inversion 4/5  4/5  Eversion 4/5  4/5  Toe extensors  3/5   3/5   Toe flexors  3/5   3/5   Tone (Ashworth scale)  0+  0+   MSRs:  Right        Left                  brachioradialis 2+  2+  biceps 2+  2+  triceps 2+  2+  patellar 1+  2+  ankle jerk 0  0  plantar response down  down   SENSORY:  Absent vibration at the knees and feet.  Temperature and pin prick is reduced in a gradient pattern below the feet.  Vibration, temperature, and pin prick is normal in the hands.  There is sway with Rhomberg testing.  COORDINATION/GAIT: Normal finger-to- nose-finger.  Intact rapid alternating movements bilaterally.  Gait appears wide-based with mild steppage.  He is unable to stand on toes or heels.  He cannot perform tandem gait.    IMPRESSION: Peripheral neuropathy affecting bilateral feet with sensorimotor involvement and sensory ataxia.  With his diabetes being so well-controlled, it is surprising to see such profound neurological deficits.  Therefore, in addition to NCS/EMG of the legs, I also want to check his lumbar spine for compressive pathology with MRI lumbar spine wwo contrast given his history of prostate cancer. Check ESR, CRP, vitamin B12, folate, SPEP with IFE, copper, zinc  Tobacco use with CAD.  Currently smoking 1.25 packs/day  Patient was informed of the dangers of tobacco abuse including stroke, cancer, and MI, as well as benefits of tobacco cessation. Patient will consider  quitting, but does not seem to be ready. Approximately 5 mins were spent counseling patient cessation techniques. We discussed various methods to help quit smoking, including deciding on a date to quit, joining a support group, pharmacological agents- nicotine gum/patch/lozenges, chantix.  I will reassess his progress at the next follow-up visit   Further recommendations pending results.   Thank you for allowing me to participate in patient's care.  If I can answer any additional questions, I would be pleased to do so.    Sincerely,    Kadar Chance K. Posey Pronto, DO

## 2018-10-25 ENCOUNTER — Other Ambulatory Visit: Payer: Self-pay

## 2018-10-25 ENCOUNTER — Other Ambulatory Visit (INDEPENDENT_AMBULATORY_CARE_PROVIDER_SITE_OTHER): Payer: BLUE CROSS/BLUE SHIELD

## 2018-10-25 ENCOUNTER — Encounter: Payer: Self-pay | Admitting: Neurology

## 2018-10-25 ENCOUNTER — Ambulatory Visit: Payer: BLUE CROSS/BLUE SHIELD | Admitting: Neurology

## 2018-10-25 VITALS — BP 134/64 | HR 72 | Ht 68.0 in | Wt 190.0 lb

## 2018-10-25 DIAGNOSIS — Z720 Tobacco use: Secondary | ICD-10-CM

## 2018-10-25 DIAGNOSIS — R29898 Other symptoms and signs involving the musculoskeletal system: Secondary | ICD-10-CM

## 2018-10-25 DIAGNOSIS — Z8546 Personal history of malignant neoplasm of prostate: Secondary | ICD-10-CM

## 2018-10-25 DIAGNOSIS — R292 Abnormal reflex: Secondary | ICD-10-CM | POA: Diagnosis not present

## 2018-10-25 DIAGNOSIS — E1142 Type 2 diabetes mellitus with diabetic polyneuropathy: Secondary | ICD-10-CM

## 2018-10-25 LAB — SEDIMENTATION RATE: Sed Rate: 17 mm/hr (ref 0–20)

## 2018-10-25 LAB — C-REACTIVE PROTEIN: CRP: <1 mg/dL (ref 0.5–20.0)

## 2018-10-25 LAB — VITAMIN B12: Vitamin B-12: 312 pg/mL (ref 211–911)

## 2018-10-25 LAB — FOLATE: Folate: 11.9 ng/mL (ref >5.9)

## 2018-10-25 NOTE — Patient Instructions (Addendum)
MRI lumbar spine wwo contrast NCS/EMG of the legs Check labs Please try to stop smoking  We will call you with the results and let you know the next step

## 2018-10-27 LAB — IMMUNOFIXATION ELECTROPHORESIS
IgG (Immunoglobin G), Serum: 917 mg/dL (ref 600–1540)
IgM, Serum: 134 mg/dL (ref 50–300)
Immunofix Electr Int: NOT DETECTED
Immunoglobulin A: 287 mg/dL (ref 70–320)

## 2018-10-27 LAB — PROTEIN ELECTROPHORESIS, SERUM
Albumin ELP: 4.3 g/dL (ref 3.8–4.8)
Alpha 1: 0.3 g/dL (ref 0.2–0.3)
Alpha 2: 0.7 g/dL (ref 0.5–0.9)
Beta 2: 0.3 g/dL (ref 0.2–0.5)
Beta Globulin: 0.3 g/dL — ABNORMAL LOW (ref 0.4–0.6)
Gamma Globulin: 0.9 g/dL (ref 0.8–1.7)
Total Protein: 6.9 g/dL (ref 6.1–8.1)

## 2018-10-27 LAB — ZINC: Zinc: 68 ug/dL (ref 60–130)

## 2018-10-27 LAB — COPPER, SERUM: Copper: 94 ug/dL (ref 70–175)

## 2018-11-07 ENCOUNTER — Other Ambulatory Visit: Payer: Self-pay

## 2018-11-07 ENCOUNTER — Encounter: Payer: BLUE CROSS/BLUE SHIELD | Admitting: Neurology

## 2018-11-07 ENCOUNTER — Ambulatory Visit
Admission: RE | Admit: 2018-11-07 | Discharge: 2018-11-07 | Disposition: A | Discharge status: Home / Self Care | Payer: BLUE CROSS/BLUE SHIELD | Source: Ambulatory Visit | Attending: Neurology | Admitting: Neurology

## 2018-11-07 DIAGNOSIS — M545 Low back pain: Secondary | ICD-10-CM | POA: Diagnosis not present

## 2018-11-07 DIAGNOSIS — E1142 Type 2 diabetes mellitus with diabetic polyneuropathy: Secondary | ICD-10-CM

## 2018-11-07 DIAGNOSIS — R292 Abnormal reflex: Secondary | ICD-10-CM

## 2018-11-07 DIAGNOSIS — R29898 Other symptoms and signs involving the musculoskeletal system: Secondary | ICD-10-CM

## 2018-11-07 MED ORDER — GADOBENATE DIMEGLUMINE 529 MG/ML IV SOLN
18.0000 mL | Freq: Once | INTRAVENOUS | Status: AC | PRN
  Administered 2018-11-07: 18 mL via INTRAVENOUS

## 2018-11-08 ENCOUNTER — Telehealth: Payer: Self-pay | Admitting: *Deleted

## 2018-11-08 ENCOUNTER — Encounter: Payer: Self-pay | Admitting: *Deleted

## 2018-11-08 NOTE — Telephone Encounter (Signed)
Results sent via My Chart.  

## 2018-11-08 NOTE — Telephone Encounter (Signed)
-----   Message from Alda Berthold, DO sent at 11/08/2018  1:02 PM EDT ----- Please inform patient that his MRI lumbar spine shows degenerative changes with mild old compression fracture and arthritis in the back. Some of this may be contributing to his unsteadiness and feet numbness/tingling.  Recommend starting out-patient PT for low back strengthening and balance training.

## 2018-11-10 ENCOUNTER — Ambulatory Visit: Payer: BLUE CROSS/BLUE SHIELD | Admitting: Neurology

## 2018-12-25 DIAGNOSIS — Z8546 Personal history of malignant neoplasm of prostate: Secondary | ICD-10-CM | POA: Diagnosis not present

## 2018-12-25 DIAGNOSIS — R109 Unspecified abdominal pain: Secondary | ICD-10-CM | POA: Diagnosis not present

## 2019-01-18 ENCOUNTER — Encounter: Payer: Self-pay | Admitting: Family Medicine

## 2019-01-18 ENCOUNTER — Other Ambulatory Visit: Payer: Self-pay

## 2019-01-18 ENCOUNTER — Ambulatory Visit (INDEPENDENT_AMBULATORY_CARE_PROVIDER_SITE_OTHER): Payer: BC Managed Care – PPO | Admitting: Family Medicine

## 2019-01-18 VITALS — BP 124/72 | HR 65 | Temp 97.8°F | Ht 68.0 in | Wt 187.6 lb

## 2019-01-18 DIAGNOSIS — C61 Malignant neoplasm of prostate: Secondary | ICD-10-CM

## 2019-01-18 DIAGNOSIS — E1169 Type 2 diabetes mellitus with other specified complication: Secondary | ICD-10-CM | POA: Diagnosis not present

## 2019-01-18 DIAGNOSIS — I2510 Atherosclerotic heart disease of native coronary artery without angina pectoris: Secondary | ICD-10-CM

## 2019-01-18 DIAGNOSIS — E119 Type 2 diabetes mellitus without complications: Secondary | ICD-10-CM | POA: Diagnosis not present

## 2019-01-18 DIAGNOSIS — E785 Hyperlipidemia, unspecified: Secondary | ICD-10-CM

## 2019-01-18 DIAGNOSIS — I2584 Coronary atherosclerosis due to calcified coronary lesion: Secondary | ICD-10-CM

## 2019-01-18 DIAGNOSIS — R2241 Localized swelling, mass and lump, right lower limb: Secondary | ICD-10-CM

## 2019-01-18 DIAGNOSIS — Z0001 Encounter for general adult medical examination with abnormal findings: Secondary | ICD-10-CM | POA: Diagnosis not present

## 2019-01-18 DIAGNOSIS — F17200 Nicotine dependence, unspecified, uncomplicated: Secondary | ICD-10-CM

## 2019-01-18 DIAGNOSIS — Z6828 Body mass index (BMI) 28.0-28.9, adult: Secondary | ICD-10-CM

## 2019-01-18 LAB — COMPREHENSIVE METABOLIC PANEL
ALT: 28 U/L (ref 0–53)
AST: 25 U/L (ref 0–37)
Albumin: 4.6 g/dL (ref 3.5–5.2)
Alkaline Phosphatase: 54 U/L (ref 39–117)
BUN: 20 mg/dL (ref 6–23)
CO2: 27 mEq/L (ref 19–32)
Calcium: 9.5 mg/dL (ref 8.4–10.5)
Chloride: 105 mEq/L (ref 96–112)
Creatinine, Ser: 1.01 mg/dL (ref 0.40–1.50)
GFR: 74.4 mL/min (ref >60.00)
Glucose, Bld: 124 mg/dL — ABNORMAL HIGH (ref 70–99)
Potassium: 4.1 mEq/L (ref 3.5–5.1)
Sodium: 140 mEq/L (ref 135–145)
Total Bilirubin: 0.7 mg/dL (ref 0.2–1.2)
Total Protein: 7 g/dL (ref 6.0–8.3)

## 2019-01-18 LAB — LDL CHOLESTEROL, DIRECT: Direct LDL: 47 mg/dL

## 2019-01-18 LAB — LIPID PANEL
Cholesterol: 103 mg/dL (ref 0–200)
HDL: 23.3 mg/dL — ABNORMAL LOW (ref >39.00)
NonHDL: 79.59
Total CHOL/HDL Ratio: 4
Triglycerides: 219 mg/dL — ABNORMAL HIGH (ref 0.0–149.0)
VLDL: 43.8 mg/dL — ABNORMAL HIGH (ref 0.0–40.0)

## 2019-01-18 LAB — CBC
HCT: 42.2 % (ref 39.0–52.0)
Hemoglobin: 15 g/dL (ref 13.0–17.0)
MCHC: 35.5 g/dL (ref 30.0–36.0)
MCV: 92.8 fl (ref 78.0–100.0)
Platelets: 154 10*3/uL (ref 150.0–400.0)
RBC: 4.55 Mil/uL (ref 4.22–5.81)
RDW: 12.7 % (ref 11.5–15.5)
WBC: 8.5 10*3/uL (ref 4.0–10.5)

## 2019-01-18 LAB — PSA: PSA: 0 ng/mL — ABNORMAL LOW (ref 0.10–4.00)

## 2019-01-18 LAB — TSH: TSH: 2.83 u[IU]/mL (ref 0.35–4.50)

## 2019-01-18 LAB — HEMOGLOBIN A1C: Hgb A1c MFr Bld: 6.7 % — ABNORMAL HIGH (ref 4.6–6.5)

## 2019-01-18 MED ORDER — METFORMIN HCL 500 MG PO TABS: ORAL_TABLET | ORAL | 6 refills | Status: DC

## 2019-01-18 NOTE — Assessment & Plan Note (Signed)
Stable.  Check lipid panel.  Continue Lipitor 40 mg daily and fenofibrate 160 mg daily.

## 2019-01-18 NOTE — Patient Instructions (Signed)
It was very nice to see you today!  Keep up the good work!  Let me know if the lump on your leg does not continue to improve or if it worsens over the next few weeks.  No medication changes today.  We will check blood work today.  Take care, Dr [name]   Preventive Care 40-64 Years, Male Preventive care refers to lifestyle choices and visits with your health care provider that can promote health and wellness. What does preventive care include?   A yearly physical exam. This is also called an annual well check.  Dental exams once or twice a year.  Routine eye exams. Ask your health care provider how often you should have your eyes checked.  Personal lifestyle choices, including: ? Daily care of your teeth and gums. ? Regular physical activity. ? Eating a healthy diet. ? Avoiding tobacco and drug use. ? Limiting alcohol use. ? Practicing safe sex. ? Taking low-dose aspirin every day starting at age 77. What happens during an annual well check? The services and screenings done by your health care provider during your annual well check will depend on your age, overall health, lifestyle risk factors, and family history of disease. Counseling Your health care provider may ask you questions about your:  Alcohol use.  Tobacco use.  Drug use.  Emotional well-being.  Home and relationship well-being.  Sexual activity.  Eating habits.  Work and work Statistician. Screening You may have the following tests or measurements:  Height, weight, and BMI.  Blood pressure.  Lipid and cholesterol levels. These may be checked every 5 years, or more frequently if you are over 33 years old.  Skin check.  Lung cancer screening. You may have this screening every year starting at age 65 if you have a 30-pack-year history of smoking and currently smoke or have quit within the past 15 years.  Colorectal cancer screening. All adults should have this screening starting at age 16 and  continuing until age 6. Your health care provider may recommend screening at age 67. You will have tests every 1-10 years, depending on your results and the type of screening test. People at increased risk should start screening at an earlier age. Screening tests may include: ? Guaiac-based fecal occult blood testing. ? Fecal immunochemical test (FIT). ? Stool DNA test. ? Virtual colonoscopy. ? Sigmoidoscopy. During this test, a flexible tube with a tiny camera (sigmoidoscope) is used to examine your rectum and lower colon. The sigmoidoscope is inserted through your anus into your rectum and lower colon. ? Colonoscopy. During this test, a long, thin, flexible tube with a tiny camera (colonoscope) is used to examine your entire colon and rectum.  Prostate cancer screening. Recommendations will vary depending on your family history and other risks.  Hepatitis C blood test.  Hepatitis B blood test.  Sexually transmitted disease (STD) testing.  Diabetes screening. This is done by checking your blood sugar (glucose) after you have not eaten for a while (fasting). You may have this done every 1-3 years. Discuss your test results, treatment options, and if necessary, the need for more tests with your health care provider. Vaccines Your health care provider may recommend certain vaccines, such as:  Influenza vaccine. This is recommended every year.  Tetanus, diphtheria, and acellular pertussis (Tdap, Td) vaccine. You may need a Td booster every 10 years.  Varicella vaccine. You may need this if you have not been vaccinated.  Zoster vaccine. You may need this after age  65.  Measles, mumps, and rubella (MMR) vaccine. You may need at least one dose of MMR if you were born in 1957 or later. You may also need a second dose.  Pneumococcal 13-valent conjugate (PCV13) vaccine. You may need this if you have certain conditions and have not been vaccinated.  Pneumococcal polysaccharide (PPSV23)  vaccine. You may need one or two doses if you smoke cigarettes or if you have certain conditions.  Meningococcal vaccine. You may need this if you have certain conditions.  Hepatitis A vaccine. You may need this if you have certain conditions or if you travel or work in places where you may be exposed to hepatitis A.  Hepatitis B vaccine. You may need this if you have certain conditions or if you travel or work in places where you may be exposed to hepatitis B.  Haemophilus influenzae type b (Hib) vaccine. You may need this if you have certain risk factors. Talk to your health care provider about which screenings and vaccines you need and how often you need them. This information is not intended to replace advice given to you by your health care provider. Make sure you discuss any questions you have with your health care provider. Document Released: 08/29/2015 Document Revised: 09/22/2017 Document Reviewed: 06/03/2015 Elsevier Interactive Patient Education  2019 [employer].

## 2019-01-18 NOTE — Progress Notes (Signed)
Please inform patient of the following:  A1c looks good - up some since last time, but still well within goal.  Cholesterol levels and everything else look good.  Recommend no changes at this point. Would like for him to follow up in 6 months to recheck A1c.  Gregory Jensen. [name], MD 01/18/2019 4:44 PM

## 2019-01-18 NOTE — Assessment & Plan Note (Signed)
Patient not urinating quite.  Discussed treatment options.  He will follow-up with me as needed.

## 2019-01-18 NOTE — Progress Notes (Signed)
Chief Complaint:  Gregory Jensen is a 63 y.o. male who presents today for his annual comprehensive physical exam.    Assessment/Plan:  Prostate cancer Stable.  Check PSA.  Nicotine dependence with current use Patient not urinating quite.  Discussed treatment options.  He will follow-up with me as needed.  Dyslipidemia associated with type 2 diabetes mellitus (HCC) Stable.  Check lipid panel.  Continue Lipitor 40 mg daily and fenofibrate 160 mg daily.  Diabetes mellitus without complication (HCC) Stable.  Check A1c.  Continue metformin 500 mg daily.  Also check CBC, C met, and TSH.  Coronary artery calcification Stable.  Continue aspirin and statin.  Leg Lump No red flags.  Continue with watchful waiting.  If does not continue to improve over the next couple weeks will check ultrasound of the area.  Discussed strict return precautions.  BMI 28 Discussed lifestyle modifications.   Preventative Healthcare: We will obtain records for eye exam.  Patient Counseling(The following topics were reviewed and/or handout was given):  -Nutrition: Stressed importance of moderation in sodium/caffeine intake, saturated fat and cholesterol, caloric balance, sufficient intake of fresh fruits, vegetables, and fiber.  -Stressed the importance of regular exercise.   -Substance Abuse: Discussed cessation/primary prevention of tobacco, alcohol, or other drug use; driving or other dangerous activities under the influence; availability of treatment for abuse.   -Injury prevention: Discussed safety belts, safety helmets, smoke detector, smoking near bedding or upholstery.   -Sexuality: Discussed sexually transmitted diseases, partner selection, use of condoms, avoidance of unintended pregnancy and contraceptive alternatives.   -Dental health: Discussed importance of regular tooth brushing, flossing, and dental visits.  -Health maintenance and immunizations reviewed. Please refer to Health  maintenance section.  Return to care in 1 year for next preventative visit.     Subjective:  HPI:  He has no acute complaints today.   Neurologist and urologist.  They are working up his lower extremity and balance.  Neurologist thinks that it could be a nerve issue.  He is also been having some penile pain.  Urologist thinks this is due to his low back issues.  He is also noticed a lump on his right lower leg for the past few weeks.  No obvious trauma or precipitating events.  Symptoms seem to be improving.  His stable, chronic medical conditions are outlined below:  # T2DM  - On Metformin 578m once daily and tolerating well.  - ROS: No polyuria or polydipsia  # Dyslipidemia / Coronary Artery Disease - On lipitor 479mdaily, aspirin 8184maily, and fenofibrate 160m5mily. Tolerating well without side effects  # Nicotine Dependence - Currently smoking about a pack per day  % Chronic Low Back Pain with imbalance and penile pain - Sees Neurology  for this - Urology thinks penile pain likely due to back pain  % Prostate Cancer s/p prostatectomy  - Follows with urology - Dr GrapRisa Grillfestyle Diet: No specific diets or eating plans.  Exercise: No specific exercises.   Depression screen PHQ [facility] 01/18/2019  Decreased Interest 0  Down, Depressed, Hopeless 0  PHQ - 2 Score 0  Altered sleeping 0  Tired, decreased energy 1  Change in appetite 0  Feeling bad or failure about yourself  0  Trouble concentrating 0  Moving slowly or fidgety/restless 0  Suicidal thoughts 0  PHQ-9 Score 1  Difficult doing work/chores Not difficult at all    Health Maintenance Due  Topic Date Due  . OPHTHALMOLOGY EXAM  07/28/2018  . HEMOGLOBIN A1C  10/18/2018  . FOOT EXAM  01/17/2019  . URINE MICROALBUMIN  01/17/2019     ROS: Per HPI, otherwise a complete review of systems was negative.   PMH:  The following were reviewed and entered/updated in epic: Past Medical History:  Diagnosis  Date  . Cancer ([place]) 4 years ago   Prostate Cancer   . Diabetes mellitus without complication ([place])  63 . Hyperlipidemia   . Sleep apnea   . Smoker    Patient Active Problem List   Diagnosis Date Noted  . Coronary artery calcification 04/19/2018  . History of colonic polyps 01/14/2017  . Diabetes mellitus without complication ([place]) 64/10/8331  . Prostate cancer ([place]) 09/22/2010  . Previous back surgery 09/22/2010  . Carpal tunnel syndrome 09/22/2010  . Nicotine dependence with current use 09/22/2010  . Dyslipidemia associated with type 2 diabetes mellitus ([place]) 09/22/2010   Past Surgical History:  Procedure Laterality Date  . BACK SURGERY  15 years ago  . COLONOSCOPY  2007 (at age 63)   pt does not know MD name/normal exam per pt.  Marland Kitchen PROSTATECTOMY  4 years ago    Family History  Problem Relation Age of Onset  . Heart disease Father   . Alzheimer's disease Mother   . Diabetes Other        family hx  . Hypertension Sister   . Heart disease Sister   . Hypertension Sister   . Stomach cancer Brother   . Colon cancer Neg Hx     Medications- reviewed and updated Current Outpatient Medications  Medication Sig Dispense Refill  . aspirin 81 MG EC tablet Take 81 mg by mouth daily.      Marland Kitchen atorvastatin (LIPITOR) 40 MG tablet Take 1 tablet (40 mg total) by mouth daily. 90 tablet 4  . fenofibrate 160 MG tablet Take 1 tablet (160 mg total) by mouth daily. 90 tablet 6  . glucose blood (ONETOUCH VERIO) test strip 1 each by Other route daily as needed for other. Use as instructed 100 each 4  . metFORMIN (GLUCOPHAGE) 500 MG tablet TAKE 1 TABLET BY MOUTH ONCE DAILY WITH A MEAL 180 tablet 6  . ONETOUCH DELICA LANCETS 83A MISC 1 each by Does not apply route daily as needed. 100 each 4   No current facility-administered medications for this visit.     Allergies-reviewed and updated No Known Allergies  Social History   Socioeconomic History  . Marital status: Significant Other     Spouse name: [name]  . Number of children: 1  . Years of education: 15  . Highest education level: Not on file  Occupational History  . 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 Financial resource strain: Not on file  . Food insecurity:    Worry: Not on file    Inability: Not on file  . Transportation needs:    Medical: Not on file    Non-medical: Not on file  Tobacco Use  . Smoking status: Current Every Day Smoker    Packs/day: 1.25    Years: 47.00    Pack years: 58.75    Types: Cigarettes  . Smokeless tobacco: Never Used  Substance and Sexual Activity  . Alcohol use: No    Alcohol/week: 0.0 standard drinks  . Drug use: No  . Sexual activity: Not on file  Lifestyle  . Physical activity:    Days per week: Not on file    Minutes per session: Not on  file  . Stress: Not on file  Relationships  . Social connections:    Talks on phone: Not on file    Gets together: Not on file    Attends religious service: Not on file    Active member of club or organization: Not on file    Attends meetings of clubs or organizations: Not on file    Relationship status: Not on file  Other Topics Concern  . Not on file  Social History Narrative   Lives with wife. Has one son who lives in [place].    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          Objective:  Physical Exam: BP 124/72 (BP Location: Left Arm, Patient Position: Sitting, Cuff Size: Normal)   Pulse 65   Temp 97.8 F (36.6 C) (Oral)   Ht '5\' 8"'  (1.727 m)   Wt 187 lb 9.6 oz (85.1 kg)   SpO2 96%   BMI 28.52 kg/m   Body mass index is 28.52 kg/m. Wt Readings from Last 3 Encounters:  01/18/19 187 lb 9.6 oz (85.1 kg)  10/25/18 190 lb (86.2 kg)  09/21/18 192 lb 12.8 oz (87.5 kg)   Gen: NAD, resting comfortably HEENT: TMs normal bilaterally. OP clear. No thyromegaly noted.  CV: RRR with no murmurs appreciated Pulm: NWOB, CTAB with no crackles, wheezes, or rhonchi GI: Normal bowel  sounds present. Soft, Nontender, Nondistended. MSK: no edema, cyanosis, or clubbing noted.  Subtle lump noted on medial aspect of right shin. Skin: warm, dry Neuro: CN2-12 grossly intact. Strength 5/5 in upper and lower extremities. Reflexes symmetric and intact bilaterally.  Psych: Normal affect and thought content     Caleb M. [name], MD 01/18/2019 8:51 AM

## 2019-01-18 NOTE — Assessment & Plan Note (Signed)
Stable.  Check A1c.  Continue metformin 500 mg daily.  Also check CBC, C met, and TSH.

## 2019-01-18 NOTE — Assessment & Plan Note (Signed)
Stable.  Check PSA.

## 2019-01-18 NOTE — Assessment & Plan Note (Signed)
Stable.  Continue aspirin and statin.

## 2019-02-05 ENCOUNTER — Ambulatory Visit
Admission: RE | Admit: 2019-02-05 | Discharge: 2019-02-05 | Disposition: A | Discharge status: Home / Self Care | Payer: BC Managed Care – PPO | Source: Ambulatory Visit | Attending: Acute Care | Admitting: Acute Care

## 2019-02-05 DIAGNOSIS — F17210 Nicotine dependence, cigarettes, uncomplicated: Secondary | ICD-10-CM | POA: Diagnosis not present

## 2019-02-05 DIAGNOSIS — Z122 Encounter for screening for malignant neoplasm of respiratory organs: Secondary | ICD-10-CM

## 2019-02-13 ENCOUNTER — Other Ambulatory Visit: Payer: Self-pay | Admitting: *Deleted

## 2019-02-13 DIAGNOSIS — Z122 Encounter for screening for malignant neoplasm of respiratory organs: Secondary | ICD-10-CM

## 2019-02-13 DIAGNOSIS — F17210 Nicotine dependence, cigarettes, uncomplicated: Secondary | ICD-10-CM

## 2019-04-19 DIAGNOSIS — Z7984 Long term (current) use of oral hypoglycemic drugs: Secondary | ICD-10-CM | POA: Diagnosis not present

## 2019-04-19 DIAGNOSIS — E119 Type 2 diabetes mellitus without complications: Secondary | ICD-10-CM | POA: Diagnosis not present

## 2019-04-19 DIAGNOSIS — H2513 Age-related nuclear cataract, bilateral: Secondary | ICD-10-CM | POA: Diagnosis not present

## 2019-05-15 ENCOUNTER — Other Ambulatory Visit: Payer: Self-pay | Admitting: Family Medicine

## 2019-07-04 ENCOUNTER — Other Ambulatory Visit: Payer: Self-pay

## 2019-07-05 ENCOUNTER — Ambulatory Visit (INDEPENDENT_AMBULATORY_CARE_PROVIDER_SITE_OTHER): Payer: BC Managed Care – PPO | Admitting: Neurology

## 2019-07-05 DIAGNOSIS — R292 Abnormal reflex: Secondary | ICD-10-CM

## 2019-07-05 DIAGNOSIS — E1142 Type 2 diabetes mellitus with diabetic polyneuropathy: Secondary | ICD-10-CM | POA: Diagnosis not present

## 2019-07-05 DIAGNOSIS — R269 Unspecified abnormalities of gait and mobility: Secondary | ICD-10-CM | POA: Diagnosis not present

## 2019-07-05 DIAGNOSIS — R29898 Other symptoms and signs involving the musculoskeletal system: Secondary | ICD-10-CM

## 2019-07-05 NOTE — Progress Notes (Signed)
Follow-up Visit   Date: 07/05/19   Gregory Jensen MRN: [id number] DOB: 1956-02-27   Interim History: Gregory Jensen is a 63 y.o. right-handed Caucasian male with diabetes mellitus, CAD, hyperlipidemia, history of prostate cancer, and tobacco use returning for follow-up of neuropathy.    History of present illness: Starting around 2015, he began having numbness of the toes which progressed into the feet.  He also noticed weakness of the toes, such that he cannot flex or extend the toes. He does not have problems manipulating car pedals.  He complains of imbalance, often noticing that his feet will hit the floor hard when walking.  He walks unassisted and has not had any falls.  Diabetes is well-controlled, last HbA1c 6.0  He does have chronic low back pain, nothing that radiates into the legs.  He admits to getting tired after prolonged walking, but does not have achy leg pain.  He had noticed that when he hyperextends his neck to look up for a period of time and then returns head to neutral position, he develops severe debilitating low back pain.  UPDATE 07/05/2019:  He was last seen in March 2020 at which time I recommended MRI lumbar spine and EMG.  Imaging of the lumbar spine shows mild chronic compression fracture and multilevel degenerative changes with lateral recess stenosis at L3-4 and L4-5 bilaterally, as well as neural foraminal stenosis at bilateral T11, L3, right L4 and bilateral L5 levels.  He is here for EMG.  He continues to have weakness in the feet and imbalance.  He suffered one fall while walking unassisted on uneven ground.  He does not use a cane, even though he has one at home.  He did not wish to do physical therapy for low back strengthening of gait training.    Medications:  Current Outpatient Medications on File Prior to Visit  Medication Sig Dispense Refill  . aspirin 81 MG EC tablet Take 81 mg by mouth daily.      Marland Kitchen atorvastatin (LIPITOR) 40 MG  tablet TAKE 1 TABLET(40 MG) BY MOUTH DAILY 90 tablet 4  . fenofibrate 160 MG tablet Take 1 tablet (160 mg total) by mouth daily. 90 tablet 6  . glucose blood (ONETOUCH VERIO) test strip 1 each by Other route daily as needed for other. Use as instructed 100 each 4  . metFORMIN (GLUCOPHAGE) 500 MG tablet TAKE 1 TABLET BY MOUTH ONCE DAILY WITH A MEAL 180 tablet 6  . ONETOUCH DELICA LANCETS 59D MISC 1 each by Does not apply route daily as needed. 100 each 4   No current facility-administered medications on file prior to visit.     Allergies: No Known Allergies  Review of Systems:  CONSTITUTIONAL: No fevers, chills, night sweats, or weight loss.  EYES: No visual changes or eye pain ENT: No hearing changes.  No history of nose bleeds.   RESPIRATORY: No cough, wheezing and shortness of breath.   CARDIOVASCULAR: Negative for chest pain, and palpitations.   GI: Negative for abdominal discomfort, blood in stools or black stools.  No recent change in bowel habits.   GU:  No history of incontinence.   MUSCLOSKELETAL: No history of joint pain or swelling.  No myalgias.   SKIN: Negative for lesions, rash, and itching.   ENDOCRINE: Negative for cold or heat intolerance, polydipsia or goiter.   PSYCH:  No depression or anxiety symptoms.   NEURO: As Above.  Neurological Exam: MENTAL STATUS including orientation to time,  place, person, recent and remote memory, attention span and concentration, language, and fund of knowledge is normal.  Speech is not dysarthric.  CRANIAL NERVES:    Normal conjugate, extra-ocular eye movements in all directions of gaze.  No ptosis.    MOTOR:  Moderate muscle atrophy of the TA and gastroc as well as intrinsic foot muscles.  No atrophy of the hands.  No abnormal movements.  No pronator drift.   Upper Extremity:  Right  Left  Deltoid  5/5   5/5   Biceps  5/5   5/5   Triceps  5/5   5/5   Infraspinatus 5/5  5/5  Medial pectoralis 5/5  5/5  Wrist extensors  5/5   5/5    Wrist flexors  5/5   5/5   Finger extensors  5/5   5/5   Finger flexors  5/5   5/5   Dorsal interossei  5/5   5/5   Abductor pollicis  5/5   5/5   Tone (Ashworth scale)  0  0   Lower Extremity:  Right  Left  Hip flexors  5/5   5/5   Hip extensors  5/5   5/5   Adductor 5/5  5/5  Abductor 5/5  5/5  Knee flexors  5/5   5/5   Knee extensors  5/5   5/5   Dorsiflexors  4/5   4/5   Plantarflexors  4/5   4/5   Toe extensors  3/5   3/5   Toe flexors  3/5   3/5   Tone (Ashworth scale)  0  0   MSRs:  Reflexes are 1+/4 in the arms and absent in the legs  SENSORY:  Absent vibration distal to knees, intact in the arms.  COORDINATION/GAIT:  Gait is wide-based mild steppage, unassisted.  Data: NCS/EMG of the legs 07/05/2019: 1. The electrophysiologic findings are consistent with a severe, active on chronic, sensorimotor axonal axonal neuropathy affecting bilateral lower extremities. 2. A superimposed L3-4 radiculopathy affecting the right lower extremity is also likely.  Correlate clinically.  MRI lumbar spine wwo contrast 11/07/2018: 1. Benign-appearing mild chronic lumbar compression fractures L1, L2, and L4. Superimposed degenerative L1 superior endplate Schmorl's node suspected, along with degenerative inflammation in the posterior L1-L2 interspinous ligament. 2. Chronic postoperative changes posteriorly at L5-S1. Mild multifactorial spinal stenosis at L3-L4 and L4-L5 with up to moderate lateral recess stenosis. 3. Moderate multifactorial neural foraminal stenosis at the bilateral T11, bilateral L3, right L4 and bilateral L5 nerve levels.  IMPRESSION/PLAN: 1. Peripheral neuropathy manifesting with numbness, weakness, and sensory ataxia (moderately severe).  His diabetes is very well-controlled and his labs do not indicate other metabolic, nutritional deficiency, or evidence of paraproteinemia.  Although diabetes can certainly contribute to neuropathy, the severity of his findings raises  concern to look further.   I recommend CSF testing to evaluate for polyradiculoneuropathy and consider genetic testing for hereditary causes, including amyloid.  He would like to think about these options and will call the office if she chooses to proceed.   I also offered PT for gait training, which he declined.  Patient educated on daily foot inspection, fall prevention, and safety precautions around the home.   2.  Multilevel degenerative changes in the lumbar spine with lateral recess stenosis at L3-4 and L4-5 bilaterally, as well as neural foraminal stenosis at bilateral T11, L3, right L4 and bilateral L5 levels.  Unlikely that these changes are significantly causing his feet symptoms.  Continue to  monitor.  Return to clinic in 3 months.   Greater than 50% of this 25 minute visit was spent in counseling, explanation of diagnosis, planning of further management, and coordination of care.   Thank you for allowing me to participate in patient's care.  If I can answer any additional questions, I would be pleased to do so.    Sincerely,    Terrah Decoster K. Posey Pronto, DO

## 2019-07-05 NOTE — Procedures (Signed)
[facility]  [address]  [place], [place] [address] Tel: [phone] Fax:  [phone] Test Date:  07/05/2019  Patient: Gregory Jensen DOB: 1955/12/08 Physician: Narda Amber, DO  Sex: Male Height: 5\' 8"  Ref Phys: Narda Amber, DO  ID#: [id number] Temp: 31.0C Technician:    Patient Complaints: This is a 63 year old man with diabetes referred for evaluation of bilateral leg numbness, imbalance, and weakness.  NCV & EMG Findings: Extensive electrodiagnostic testing of the right lower extremity and additional studies of the left shows:  1. Bilateral sural and superficial peroneal sensory responses are absent. 2. Bilateral peroneal (EDB) and tibial motor responses are absent.  Bilateral peroneal motor responses at the tibialis anterior show severely reduced amplitude (R1.3, L1.2 mV).   3. Bilateral tibial H reflex studies are absent.   4. There is a gradient pattern of chronic motor axonal loss changes affecting the muscles and bilateral lower extremities, which is worse below the knee where there is evidence of active denervation.  Additionally, chronic motor axonal loss changes are seen in the right rectus femoris and abductor longus muscles.   Impression: 1. The electrophysiologic findings are consistent with a severe, active on chronic, sensorimotor axonal axonal neuropathy affecting bilateral lower extremities. 2. A superimposed L3-4 radiculopathy affecting the right lower extremity is also likely.  Correlate clinically.   ___________________________ Narda Amber, DO    Nerve Conduction Studies Anti Sensory Summary Table   Site NR Peak (ms) Norm Peak (ms) P-T Amp (V) Norm P-T Amp  Left Sup Peroneal Anti Sensory (Ant Lat Mall)  31C  12 cm NR  <4.6  >3  Right Sup Peroneal Anti Sensory (Ant Lat Mall)  31C  12 cm NR  <4.6  >3  Left Sural Anti Sensory (Lat Mall)  31C  Calf NR  <4.6  >3  Right Sural Anti Sensory (Lat Mall)  31C  Calf NR  <4.6   >3   Motor Summary Table   Site NR Onset (ms) Norm Onset (ms) O-P Amp (mV) Norm O-P Amp Site1 Site2 Delta-0 (ms) Dist (cm) Vel (m/s) Norm Vel (m/s)  Left Peroneal Motor (Ext Dig Brev)  31C  Ankle NR  <6.0  >2.5 B Fib Ankle  0.0  >40  B Fib NR     Poplt B Fib  0.0  >40  Poplt NR            Right Peroneal Motor (Ext Dig Brev)  31C  Ankle NR  <6.0  >2.5 B Fib Ankle  0.0  >40  B Fib NR     Poplt B Fib  0.0  >40  Poplt NR            Left Peroneal TA Motor (Tib Ant)  31C  Fib Head    3.8 <4.5 1.2 >3 Poplit Fib Head 1.4 8.0 57 >40  Poplit    5.2  0.9         Right Peroneal TA Motor (Tib Ant)  31C  Fib Head    3.6 <4.5 1.3 >3 Poplit Fib Head 2.0 8.0 40 >40  Poplit    5.6  1.2         Left Tibial Motor (Abd Hall Brev)  31C  Ankle NR  <6.0  >4 Knee Ankle  0.0  >40  Knee NR            Right Tibial Motor (Abd Hall Brev)  31C  Ankle NR  <6.0  >4 Knee  Ankle  0.0  >40  Knee NR             H Reflex Studies   NR H-Lat (ms) Lat Norm (ms) L-R H-Lat (ms)  Left Tibial (Gastroc)  31C  NR  <35   Right Tibial (Gastroc)  31C  NR  <35    EMG   Side Muscle Ins Act Fibs Psw Fasc Number Recrt Dur Dur. Amp Amp. Poly Poly. Comment  Right AdductorLong Nml Nml Nml Nml 2- Rapid Many 1+ Many 1+ Many 1+ N/A  Left AntTibialis Nml 1+ Nml Nml SMU Rapid All 1+ All 2+ All 2+ ATR  Left BicepsFemS Nml Nml Nml Nml 2- Rapid Some 1+ Some 1+ Some 1+ N/A  Left Flex Dig Long Nml Nml Nml Nml SMU Rapid All 1+ All 1+ All 1+ N/A  Left Gastroc Nml 1+ Nml Nml 3- Rapid Many 1+ Many 1+ Many 1+ N/A  Left GluteusMed Nml Nml Nml Nml Nml Nml Nml Nml Nml Nml Nml Nml N/A  Left RectFemoris Nml Nml Nml Nml Nml Nml Nml Nml Nml Nml Nml Nml N/A  Right AntTibialis Nml 1+ Nml Nml SMU Rapid All 2+ All 2+ All 2+ ATR  Right BicepsFemS Nml Nml Nml Nml 2- Rapid Some 1+ Some 1+ Some 1+ N/A  Right Flex Dig Long Nml Nml Nml Nml 2- Rapid Many 1+ Many 1+ Many 1+ N/A  Right Gastroc Nml 1+ Nml Nml 3- Rapid Most 1+ Most 1+ Many 1+ N/A  Right  GluteusMed Nml Nml Nml Nml Nml Nml Nml Nml Nml Nml Nml Nml N/A  Right RectFemoris Nml Nml Nml Nml 2- Rapid Some 1+ Some 1+ Some 1+ N/A      Waveforms:

## 2019-07-05 NOTE — Patient Instructions (Signed)
Follow up in 3 months

## 2019-07-11 ENCOUNTER — Other Ambulatory Visit: Payer: Self-pay

## 2019-07-11 ENCOUNTER — Telehealth: Payer: Self-pay | Admitting: Family Medicine

## 2019-07-11 DIAGNOSIS — Z0000 Encounter for general adult medical examination without abnormal findings: Secondary | ICD-10-CM

## 2019-07-11 MED ORDER — FENOFIBRATE 160 MG PO TABS: 160.0000 mg | ORAL_TABLET | Freq: Every day | ORAL | 1 refills | Status: DC

## 2019-07-11 NOTE — Telephone Encounter (Signed)
Rx sent to pharmacy   

## 2019-07-11 NOTE — Telephone Encounter (Signed)
Pt needs a refill on fenofibrate 160 mg. [facility] [place]. Last refilled by doctor [facility]

## 2019-07-20 ENCOUNTER — Other Ambulatory Visit: Payer: Self-pay

## 2019-07-23 ENCOUNTER — Encounter: Payer: Self-pay | Admitting: Family Medicine

## 2019-07-23 ENCOUNTER — Ambulatory Visit: Payer: BC Managed Care – PPO | Admitting: Family Medicine

## 2019-07-23 VITALS — BP 138/78 | HR 71 | Temp 97.9°F | Ht 68.0 in | Wt 190.0 lb

## 2019-07-23 DIAGNOSIS — M2550 Pain in unspecified joint: Secondary | ICD-10-CM | POA: Diagnosis not present

## 2019-07-23 DIAGNOSIS — E1169 Type 2 diabetes mellitus with other specified complication: Secondary | ICD-10-CM | POA: Diagnosis not present

## 2019-07-23 DIAGNOSIS — E119 Type 2 diabetes mellitus without complications: Secondary | ICD-10-CM | POA: Diagnosis not present

## 2019-07-23 DIAGNOSIS — C61 Malignant neoplasm of prostate: Secondary | ICD-10-CM

## 2019-07-23 DIAGNOSIS — E785 Hyperlipidemia, unspecified: Secondary | ICD-10-CM

## 2019-07-23 LAB — URINALYSIS, ROUTINE W REFLEX MICROSCOPIC
Bilirubin Urine: NEGATIVE
Ketones, ur: NEGATIVE
Leukocytes,Ua: NEGATIVE
Nitrite: NEGATIVE
RBC / HPF: NONE SEEN (ref 0–?)
Specific Gravity, Urine: 1.02 (ref 1.000–1.030)
Total Protein, Urine: NEGATIVE
Urine Glucose: NEGATIVE
Urobilinogen, UA: 0.2 (ref 0.0–1.0)
pH: 6 (ref 5.0–8.0)

## 2019-07-23 LAB — COMPREHENSIVE METABOLIC PANEL WITH GFR
ALT: 23 U/L (ref 0–53)
AST: 19 U/L (ref 0–37)
Albumin: 4.8 g/dL (ref 3.5–5.2)
Alkaline Phosphatase: 61 U/L (ref 39–117)
BUN: 15 mg/dL (ref 6–23)
CO2: 28 meq/L (ref 19–32)
Calcium: 9.9 mg/dL (ref 8.4–10.5)
Chloride: 105 meq/L (ref 96–112)
Creatinine, Ser: 1.03 mg/dL (ref 0.40–1.50)
GFR: 72.62 mL/min
Glucose, Bld: 141 mg/dL — ABNORMAL HIGH (ref 70–99)
Potassium: 4.5 meq/L (ref 3.5–5.1)
Sodium: 140 meq/L (ref 135–145)
Total Bilirubin: 0.6 mg/dL (ref 0.2–1.2)
Total Protein: 7.3 g/dL (ref 6.0–8.3)

## 2019-07-23 LAB — CBC
HCT: 44.9 % (ref 39.0–52.0)
Hemoglobin: 15.4 g/dL (ref 13.0–17.0)
MCHC: 34.2 g/dL (ref 30.0–36.0)
MCV: 94.1 fl (ref 78.0–100.0)
Platelets: 166 K/uL (ref 150.0–400.0)
RBC: 4.77 Mil/uL (ref 4.22–5.81)
RDW: 12.8 % (ref 11.5–15.5)
WBC: 8.9 K/uL (ref 4.0–10.5)

## 2019-07-23 LAB — POCT GLYCOSYLATED HEMOGLOBIN (HGB A1C): Hemoglobin A1C: 6.2 % — AB (ref 4.0–5.6)

## 2019-07-23 LAB — PSA: PSA: 0 ng/mL — ABNORMAL LOW (ref 0.10–4.00)

## 2019-07-23 LAB — TSH: TSH: 4.05 u[IU]/mL (ref 0.35–4.50)

## 2019-07-23 NOTE — Assessment & Plan Note (Signed)
Patient would like to try fenofibrate to see if this helps with joint pains.  Will continue Lipitor 40 mg daily.

## 2019-07-23 NOTE — Assessment & Plan Note (Signed)
Likely OA.  No red flags.  Recommended over-the-counter Tylenol and Voltaren as needed.  Recommended strengthening exercises.

## 2019-07-23 NOTE — Assessment & Plan Note (Signed)
A1c 6.2.  Congratulated patient on this.  Follow-up in 6 months.  Will check CBC, C met, TSH, and UA today.

## 2019-07-23 NOTE — Patient Instructions (Addendum)
It was very nice to see you today!  Please try taking tylenol or use voltaren as needed for your pain - I think you have arthritis.  You can try stopping the fenofibrate for few days to see if this helps.   Keep an eye on your blood pressure and let me know if persistently 140/90 or higher.   No other changes today.  We will check blood work and a urine sample.  Come back in 6 months , or sooner if needed.   Take care, Dr [name]  Please try these tips to maintain a healthy lifestyle:   Eat at least 3 REAL meals and 1-2 snacks per day.  Aim for no more than 5 hours between eating.  If you eat breakfast, please do so within one hour of getting up.    Obtain twice as many fruits/vegetables as protein or carbohydrate foods for both lunch and dinner. (Half of each meal should be fruits/vegetables, one quarter protein, and one quarter starchy carbs)   Cut down on sweet beverages. This includes juice, soda, and sweet tea.    Exercise at least 150 minutes every week.

## 2019-07-23 NOTE — Assessment & Plan Note (Signed)
Stable.  Check PSA.

## 2019-07-23 NOTE — Progress Notes (Signed)
Chief Complaint:  Gregory Jensen is a 63 y.o. male who presents today with a chief complaint of joint Jensen.   Assessment/Plan:  Joint Jensen Likely OA.  No red flags.  Recommended over-the-counter Tylenol and Voltaren as needed.  Recommended strengthening exercises.  Diabetes mellitus without complication (HCC) N4N 6.2.  Congratulated patient on this.  Follow-up in 6 months.  Will check CBC, C met, TSH, and UA today.  Dyslipidemia associated with type 2 diabetes mellitus ([place]) Patient would like to try fenofibrate to see if this helps with joint pains.  Will continue Lipitor 40 mg daily.  Prostate cancer Stable.  Check PSA.     Subjective:  HPI:  Joint Jensen Located in bilateral shoulders left hip.  Started several months ago.  Stable.  Better with activity.  No treatments tried.  No locking, popping, or catching.  No swelling.  His stable, chronic medical conditions are outlined below:  # T2DM  - On Metformin 595m once daily and tolerating well.  - ROS: No polyuria or polydipsia  # Dyslipidemia / Coronary Artery Disease - On lipitor 460mdaily, aspirin 812maily, and fenofibrate 160m36mily. Tolerating well without side effects  # Nicotine Dependence - Currently smoking about a pack per day  % Chronic Low Back Jensen with imbalance and penile Jensen - Sees Neurology for this - Urology thinks penile Jensen likely due to back Jensen  % Prostate Cancer s/p prostatectomy  - Follows with urology - Dr GrapRisa GrillS: Per HPI  PMH: He reports that he has been smoking cigarettes. He has a 58.75 pack-year smoking history. He has never used smokeless tobacco. He reports that he does not drink alcohol or use drugs.      Objective:  Physical Exam: BP 138/78   Pulse 71   Temp 97.9 F (36.6 C)   Ht 5' 8" (1.727 m)   Wt 190 lb (86.2 kg)   SpO2 98%   BMI 28.89 kg/m   Gen: NAD, resting comfortably CV: Regular rate and rhythm with no murmurs appreciated Pulm: Normal work of  breathing, clear to auscultation bilaterally with no crackles, wheezes, or rhonchi GI: Normal bowel sounds present. Soft, Nontender, Nondistended. MSK: No edema, cyanosis, or clubbing noted -Bilateral shoulders: No deformities.  Nontender to palpation.  Full range of motion throughout.  No Jensen with resisted supraspinatus, internal rotation, or external rotation testing. -Left hip: Mildly decreased external rotation.  No Jensen with palpation.  Neurovascular intact distally. Skin: Warm, dry Neuro: Grossly normal, moves all extremities Psych: Normal affect and thought content  Results for orders placed or performed in visit on 07/23/19 (from the past 24 hour(s))  POCT HgB A1C     Status: Abnormal   Collection Time: 07/23/19  8:25 AM  Result Value Ref Range   Hemoglobin A1C 6.2 (A) 4.0 - 5.6 %        Gregory M. ParkJerline Jensen 07/23/2019 8:44 AM

## 2019-07-25 NOTE — Progress Notes (Signed)
Please inform patient of the following:  Labs are all stable. Would like for him to keep up the good work and we can recheck in 6 months.  Gregory Jensen. [name], MD 07/25/2019 8:00 AM

## 2019-10-03 ENCOUNTER — Other Ambulatory Visit: Payer: Self-pay | Admitting: Family Medicine

## 2019-10-03 DIAGNOSIS — Z0000 Encounter for general adult medical examination without abnormal findings: Secondary | ICD-10-CM

## 2019-10-05 ENCOUNTER — Ambulatory Visit: Payer: BC Managed Care – PPO | Admitting: Neurology

## 2019-10-12 ENCOUNTER — Ambulatory Visit: Payer: BC Managed Care – PPO | Admitting: Neurology

## 2019-10-12 ENCOUNTER — Encounter: Payer: Self-pay | Admitting: Neurology

## 2019-10-12 ENCOUNTER — Other Ambulatory Visit: Payer: Self-pay

## 2019-10-12 VITALS — BP 150/71 | HR 75 | Resp 14 | Ht 68.0 in | Wt 187.0 lb

## 2019-10-12 DIAGNOSIS — E1142 Type 2 diabetes mellitus with diabetic polyneuropathy: Secondary | ICD-10-CM | POA: Diagnosis not present

## 2019-10-12 DIAGNOSIS — M5410 Radiculopathy, site unspecified: Secondary | ICD-10-CM

## 2019-10-12 NOTE — Progress Notes (Signed)
Follow-up Visit   Date: 10/12/19   HADI DUBIN MRN: [id number] DOB: 08/26/1955   Interim History: Gregory Jensen is a 64 y.o. right-handed Caucasian male with diabetes mellitus, CAD, hyperlipidemia, history of prostate cancer, and tobacco use returning for follow-up of neuropathy.    History of present illness: Starting around 2015, he began having numbness of the toes which progressed into the feet.  He also noticed weakness of the toes, such that he cannot flex or extend the toes. He does not have problems manipulating car pedals.  He complains of imbalance, often noticing that his feet will hit the floor hard when walking.  He walks unassisted and has not had any falls.  Diabetes is well-controlled, last HbA1c 6.0  He does have chronic low back pain, nothing that radiates into the legs. MRI lumbar spine shows multilevel degenerative changes with lateral recess stenosis at L3-4 and L4-5 bilaterally, as well as neural foraminal stenosis at bilateral T11, L3, right L4 and bilateral L5 levels.   In November 2020, he had EMG which shows axonal sensorimotor polyneuropathy affecting the feet.  Labs looking at potential causes have been normal.  UPDATE 10/12/2019:  He is here for follow-up visit.  Over the past several months, there has been no significant change in the degree of his numbness.  He continues to feel off balance and can trip, if he is not careful.  He has very little to no movement of the toes, which is slightly worse.  He denies painful tingling or burning of the feet.     Medications:  Current Outpatient Medications on File Prior to Visit  Medication Sig Dispense Refill  . aspirin 81 MG EC tablet Take 81 mg by mouth daily.      Marland Kitchen atorvastatin (LIPITOR) 40 MG tablet TAKE 1 TABLET(40 MG) BY MOUTH DAILY 90 tablet 4  . fenofibrate 160 MG tablet TAKE 1 TABLET(160 MG) BY MOUTH DAILY 90 tablet 1  . glucose blood (ONETOUCH VERIO) test strip 1 each by Other route  daily as needed for other. Use as instructed 100 each 4  . metFORMIN (GLUCOPHAGE) 500 MG tablet TAKE 1 TABLET BY MOUTH ONCE DAILY WITH A MEAL 180 tablet 6  . ONETOUCH DELICA LANCETS 24Q MISC 1 each by Does not apply route daily as needed. 100 each 4   No current facility-administered medications on file prior to visit.    Allergies: No Known Allergies  Review of Systems:  CONSTITUTIONAL: No fevers, chills, night sweats, or weight loss.  EYES: No visual changes or eye pain ENT: No hearing changes.  No history of nose bleeds.   RESPIRATORY: No cough, wheezing and shortness of breath.   CARDIOVASCULAR: Negative for chest pain, and palpitations.   GI: Negative for abdominal discomfort, blood in stools or black stools.  No recent change in bowel habits.   GU:  No history of incontinence.   MUSCLOSKELETAL: No history of joint pain or swelling.  No myalgias.   SKIN: Negative for lesions, rash, and itching.   ENDOCRINE: Negative for cold or heat intolerance, polydipsia or goiter.   PSYCH:  No depression or anxiety symptoms.   NEURO: As Above.  Neurological Exam: MENTAL STATUS including orientation to time, place, person, recent and remote memory, attention span and concentration, language, and fund of knowledge is normal.  Speech is not dysarthric.  CRANIAL NERVES:    Normal conjugate, extra-ocular eye movements in all directions of gaze.  No ptosis.  MOTOR:  Moderate muscle atrophy of the TA and gastroc as well as intrinsic foot muscles.  No atrophy of the hands.  No abnormal movements.  No pronator drift.   Upper Extremity:  Right  Left  Deltoid  5/5   5/5   Biceps  5/5   5/5   Triceps  5/5   5/5   Infraspinatus 5/5  5/5  Medial pectoralis 5/5  5/5  Wrist extensors  5/5   5/5   Wrist flexors  5/5   5/5   Finger extensors  5/5   5/5   Finger flexors  5/5   5/5   Dorsal interossei  5/5   5/5   Abductor pollicis  5/5   5/5   Tone (Ashworth scale)  0  0   Lower Extremity:  Right   Left  Hip flexors  5/5   5/5   Hip extensors  5/5   5/5   Adductor 5/5  5/5  Abductor 5/5  5/5  Knee flexors  5/5   5/5   Knee extensors  5/5   5/5   Dorsiflexors  4/5   4/5   Plantarflexors  4/5   4/5   Toe extensors  3/5   3/5   Toe flexors  2/5   2/5   Tone (Ashworth scale)  0  0   MSRs:  Reflexes are 1+/4 in the arms and absent in the legs  SENSORY:  Absent vibration distal to knees, intact in the arms.  COORDINATION/GAIT:  Gait is wide-based mild steppage, unassisted.  Unable to hand on heels or toes.  Unable to perform tandem gait.   Data: NCS/EMG of the legs 07/05/2019: 1. The electrophysiologic findings are consistent with a severe, active on chronic, sensorimotor axonal axonal neuropathy affecting bilateral lower extremities. 2. A superimposed L3-4 radiculopathy affecting the right lower extremity is also likely.  Correlate clinically.  MRI lumbar spine wwo contrast 11/07/2018: 1. Benign-appearing mild chronic lumbar compression fractures L1, L2, and L4. Superimposed degenerative L1 superior endplate Schmorl's node suspected, along with degenerative inflammation in the posterior L1-L2 interspinous ligament. 2. Chronic postoperative changes posteriorly at L5-S1. Mild multifactorial spinal stenosis at L3-L4 and L4-L5 with up to moderate lateral recess stenosis. 3. Moderate multifactorial neural foraminal stenosis at the bilateral T11, bilateral L3, right L4 and bilateral L5 nerve levels.  IMPRESSION/PLAN: 1.  Peripheral neuropathy with axonal loss manifesting with numbness, distal weakness, and sensory ataxia.  Symptoms have been ongoing for the past few years and progressive.  His diabetes is very-well controlled and labs looking at metabolic/nutritional deficiency and paraproteinemia has been normal.  Although diabetes can cause neuropathy, with his blood glucose so well controlled, I would not expect it to manifest with marked neurological deficits as he has.  Therefore, I  discussed looking for signs of inflammation in the spinal fluid to be sure an immune-mediated polyradiculoneuropathy is not being missed.  He is agreeable to proceed with this today.   Proceed with lumbar puncture: check  CSF cell count and diff, protein, glucose, IgG index, oligoclonal bands, flow cytology, ACE If CSF is normal, next step is genetic testing through [facility]  Driving precautions discussed  2.  Multilevel degenerative changes in the lumbar spine with lateral recess stenosis at L3-4 and L4-5 bilaterally, as well as neural foraminal stenosis at bilateral T11, L3, right L4 and bilateral L5 levels.  Unlikely that these changes are significantly causing his feet symptoms.  Continue to monitor.  Further recommendations pending  results.    Thank you for allowing me to participate in patient's care.  If I can answer any additional questions, I would be pleased to do so.    Sincerely,    Kylin Genna K. Posey Pronto, DO

## 2019-10-12 NOTE — Patient Instructions (Signed)
We will order spinal fluid analysis.     Lumbar Puncture A lumbar puncture, also called a spinal tap, is a procedure that is done to remove a small amount of the fluid that surrounds the brain and spinal cord (cerebrospinal fluid, CSF). The fluid is then examined in the lab. This procedure may be done to:  Help diagnose various problems, such as meningitis, encephalitis, multiple sclerosis, and other infections.  Remove fluid and relieve pressure that occurs with certain types of headaches.  Look for bleeding within the brain and spinal cord areas (central nervous system).  Place medicine into the spinal fluid. Tell a health care provider about:  Any allergies you have.  All medicines you are taking, including vitamins, herbs, eye drops, creams, and over-the-counter medicines like aspirin or NSAIDs.  Any problems you or family members have had with anesthetic medicines.  Any blood disorders you have.  Any surgeries you have had.  Any medical conditions you have.  Whether you are pregnant or may be pregnant. What are the risks? Generally, this is a safe procedure. However, problems may occur, including:  Infection at the insertion site that can spread to the bone or spinal fluid.  Bleeding.  Spinal headache. This is a severe headache that occurs when there is a leak of spinal fluid.  Leakage of CSF.  Allergic reactions to medicines or dyes.  Damage to other structures or organs. What happens before the procedure? Staying hydrated Follow instructions from your health care provider about hydration, which may include:  Up to 2 hours before the procedure - you may continue to drink clear liquids, such as water, clear fruit juice, black coffee, and plain tea. Eating and drinking restrictions Follow instructions from your health care provider about eating and drinking. If you will be given a medicine to help you relax (sedative), these instructions may include:  8 hours  before the procedure - stop eating heavy meals or foods such as meat, fried foods, or fatty foods.  6 hours before the procedure - stop eating light meals or foods, such as toast or cereal.  6 hours before the procedure - stop drinking milk or drinks that contain milk.  2 hours before the procedure - stop drinking clear liquids. Medicines  Ask your health care provider about: ? Changing or stopping your regular medicines. This is especially important if you are taking diabetes medicines or blood thinners. ? Taking medicines such as aspirin and ibuprofen. These medicines can thin your blood. Do not take these medicines before your procedure if your health care provider instructs you not to.  You may be given antibiotic medicine to help prevent infection. If so, take the antibiotic as told by your health care provider. General instructions  You may have a blood sample taken.  You may be asked to shower with a germ-killing soap.  Ask your health care provider how your surgical site will be marked or identified.  Plan to have someone take you home from the hospital or clinic. This is especially important if you will be given a sedative.  If you will be going home right after the procedure, plan to have someone with you for 24 hours. What happens during the procedure?   You may lie down on your side with your knees bent, or you may sit with your head resting on a pillow on your lap. ? How you are positioned depends on your age and size. ? You will be positioned so that the spaces  between the bones of the spine (vertebrae) are as wide as possible. This will make it easier to pass the needle into the spinal canal.  The skin on your lower back (lumbar region) will be cleaned.  You will be given an injection of medicine to numb your lower back area (local anesthetic).  You may be given pain medicine or a sedative.  A small needle will be inserted into your lower back until it enters the  space that contains the cerebrospinal fluid. The needle will not enter the spinal cord.  Fluid will be collected into tubes. It will be sent to a lab for examination.  The needle will be withdrawn, and a bandage (dressing) will be placed over the area. The procedure may vary among health care providers and hospitals. What happens after the procedure?  Your blood pressure, heart rate, breathing rate, and blood oxygen level will be monitored until any medicines you were given have worn off.  You may need to stay lying down for a while.  You will need to drink plenty of fluids and caffeine to help prevent a headache.  Do not drive for 24 hours if you received a sedative. Summary  A lumbar puncture, also called a spinal tap, is a procedure that is done to remove a small amount of the cerebrospinal fluid, CSF. This may be done to help diagnose a wide variety of conditions.  Before the procedure, tell your health care provider about all medicines you are taking, including vitamins, herbs, eye drops, creams, and over-the-counter medicines.  Before the procedure, ask your health care provider about changing or stopping your regular medicines. This is especially important if you are taking blood thinners.  Your lower back will be numbed with an injection before the needle is placed into your spinal canal.  After the procedure, you will lie down for a while and you will drink plenty of fluids. This information is not intended to replace advice given to you by your health care provider. Make sure you discuss any questions you have with your health care provider. Document Revised: 09/15/2016 Document Reviewed: 09/15/2016 Elsevier Patient Education  2020 [employer].

## 2019-10-26 ENCOUNTER — Other Ambulatory Visit: Payer: Self-pay

## 2019-10-26 ENCOUNTER — Ambulatory Visit
Admission: RE | Admit: 2019-10-26 | Discharge: 2019-10-26 | Disposition: A | Discharge status: Home / Self Care | Payer: BC Managed Care – PPO | Source: Ambulatory Visit | Attending: Neurology | Admitting: Neurology

## 2019-10-26 ENCOUNTER — Other Ambulatory Visit (HOSPITAL_COMMUNITY)
Admission: RE | Admit: 2019-10-26 | Discharge: 2019-10-26 | Disposition: A | Discharge status: Home / Self Care | Payer: BC Managed Care – PPO | Source: Ambulatory Visit | Attending: Neurology | Admitting: Neurology

## 2019-10-26 DIAGNOSIS — R836 Abnormal cytological findings in cerebrospinal fluid: Secondary | ICD-10-CM | POA: Diagnosis not present

## 2019-10-26 DIAGNOSIS — M5410 Radiculopathy, site unspecified: Secondary | ICD-10-CM

## 2019-10-26 DIAGNOSIS — M5416 Radiculopathy, lumbar region: Secondary | ICD-10-CM | POA: Diagnosis not present

## 2019-10-26 LAB — CYTOLOGY - NON PAP

## 2019-10-26 NOTE — Discharge Instructions (Signed)

## 2019-10-26 NOTE — Progress Notes (Signed)
Blood drawn from pts Left AC by Lavenia Atlas, RN for LP labs. 1 attempt, pt tolerated well. Site clean and dry after.

## 2019-11-03 LAB — CNS IGG SYNTHESIS RATE, CSF+BLOOD
Albumin Serum: 4.8 g/dL — ABNORMAL HIGH (ref 3.2–4.6)
Albumin, CSF: 30.5 mg/dL (ref 8.0–42.0)
CNS-IgG Synthesis Rate: -0.8 mg/24 h (ref <=3.3)
IgG (Immunoglobin G), Serum: 885 mg/dL (ref 600–1540)
IgG Total CSF: 3 mg/dL (ref 0.8–7.7)
IgG-Index: 0.53 (ref <0.66)

## 2019-11-03 LAB — CSF CELL COUNT WITH DIFFERENTIAL
RBC Count, CSF: 0 cells/uL
WBC, CSF: 0 cells/uL (ref 0–5)

## 2019-11-03 LAB — OLIGOCLONAL BANDS, CSF + SERM

## 2019-11-03 LAB — PROTEIN, CSF: Total Protein, CSF: 45 mg/dL (ref 15–60)

## 2019-11-03 LAB — ANGIOTENSIN CONVERTING ENZYME, CSF: ACE, CSF: 8 U/L (ref <=15)

## 2019-11-03 LAB — GLUCOSE, CSF: Glucose, CSF: 93 mg/dL — ABNORMAL HIGH (ref 40–80)

## 2019-12-10 ENCOUNTER — Other Ambulatory Visit: Payer: Self-pay | Admitting: *Deleted

## 2019-12-10 MED ORDER — METFORMIN HCL 500 MG PO TABS: ORAL_TABLET | ORAL | 6 refills | Status: DC

## 2019-12-11 ENCOUNTER — Other Ambulatory Visit: Payer: Self-pay

## 2019-12-11 ENCOUNTER — Telehealth: Payer: Self-pay | Admitting: Family Medicine

## 2019-12-11 MED ORDER — METFORMIN HCL 500 MG PO TABS: ORAL_TABLET | ORAL | 0 refills | Status: DC

## 2019-12-11 NOTE — Telephone Encounter (Signed)
Patient is calling checking on the status on metFORMIN (GLUCOPHAGE) 500 MG tablet, states [facility] told him last night that they have yet to receive anything.

## 2019-12-11 NOTE — Telephone Encounter (Signed)
Rx sent 

## 2019-12-29 IMAGING — MR MRI LUMBAR SPINE WITHOUT AND WITH CONTRAST
7 series · 48 of 48 positions shown · IV contrast (18 ML MULTIHANCE)
Comparison: CT chest 02/03/2018.

CLINICAL DATA: 62-year-old male with persistent low back pain,
bilateral leg pain weakness and numbness. Hyper reflexia. Remote
prior lumbar surgery. Treated prostate cancer in 7287.

Creatinine was obtained on site at [HOSPITAL] at [HOSPITAL].
Results: Creatinine 1.1 mg/dL.
EXAM:
MRI LUMBAR SPINE WITHOUT AND WITH CONTRAST
TECHNIQUE: Multiplanar and multiecho pulse sequences of the lumbar spine were
obtained without and with intravenous contrast.
CONTRAST:  18mL MULTIHANCE GADOBENATE DIMEGLUMINE 529 MG/ML IV SOLN

[Series 3: tirm sag · sagittal · 4.0mm · 0.88mm/px · 4 of 12 slices shown]
[im 1/12]
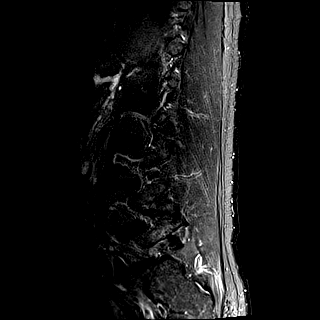
[im 4/12]
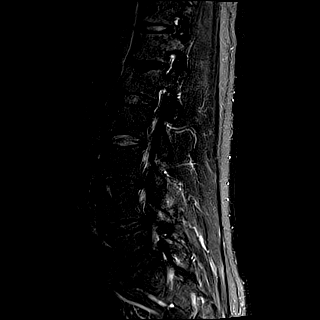
[im 8/12]
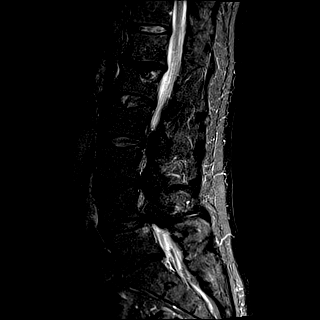
[im 12/12]
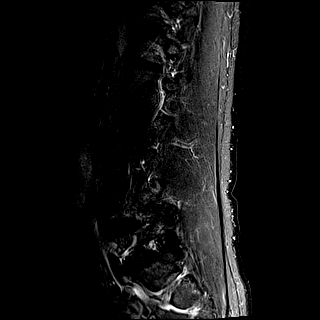

[Series 4: T1 · sagittal · 4.0mm · 0.88mm/px · 4 of 12 slices shown (1 of 2)]
[im 1/12]
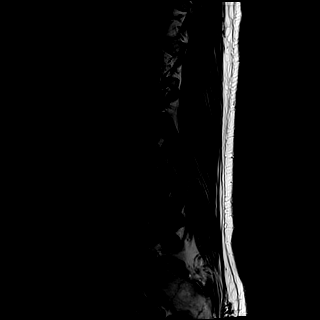
[im 4/12]
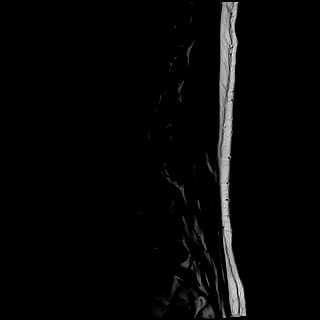
[im 8/12]
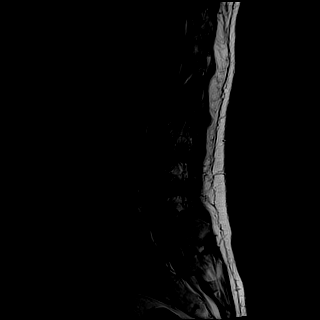
[im 12/12]
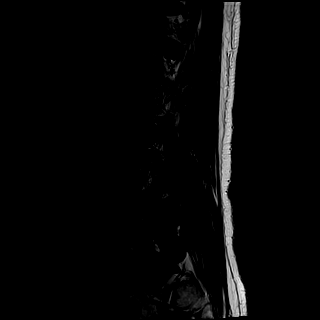

[Series 5: T2 · axial · 4.0mm · 0.70mm/px · z∈[-131,+80]mm · 12 of 40 slices shown (1 of 2)]
[im 1/40]
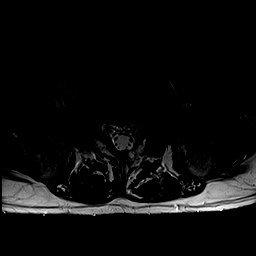
[im 4/40]
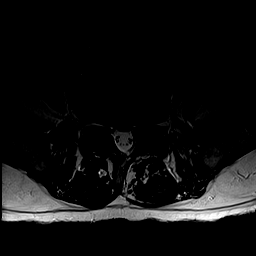
[im 8/40]
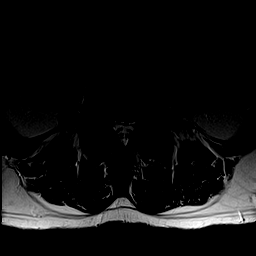
[im 11/40]
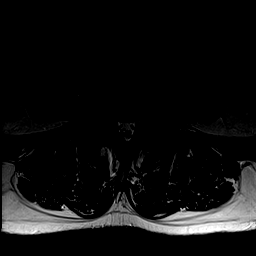
[im 15/40]
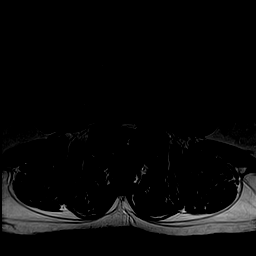
[im 18/40]
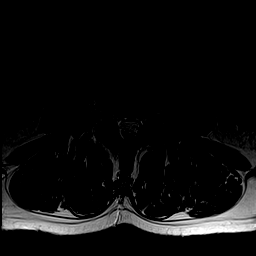
[im 22/40]
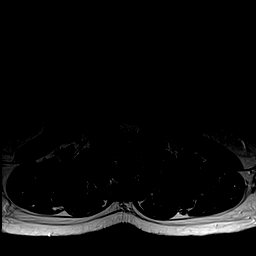
[im 25/40]
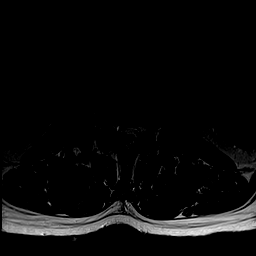
[im 29/40]
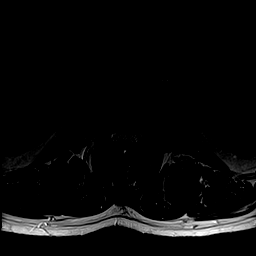
[im 32/40]
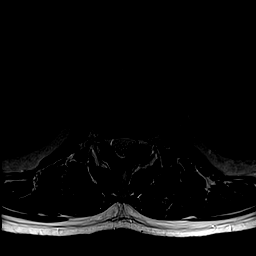
[im 36/40]
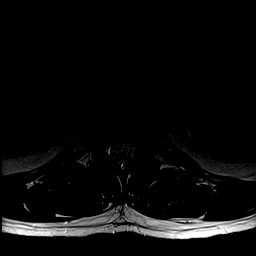
[im 40/40]
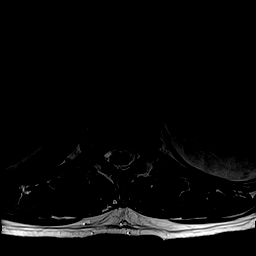

[Series 6: T1 · axial · 4.0mm · 0.70mm/px · z∈[-131,+80]mm · 11 of 40 slices shown (2 of 2)]
[im 1/40]
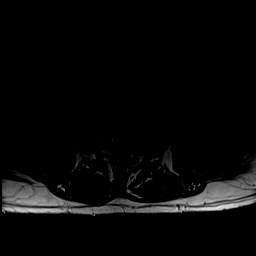
[im 4/40]
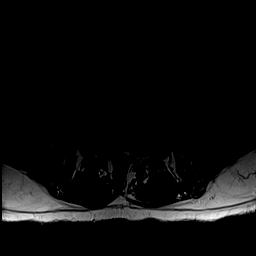
[im 8/40]
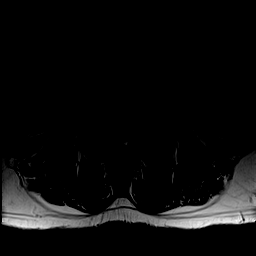
[im 12/40]
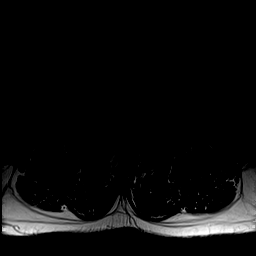
[im 16/40]
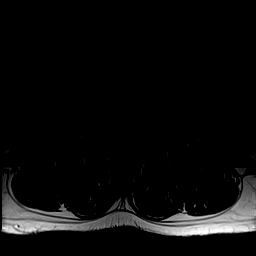
[im 20/40]
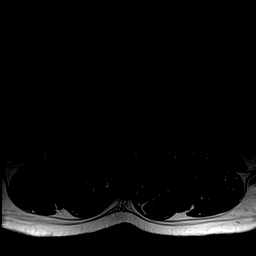
[im 24/40]
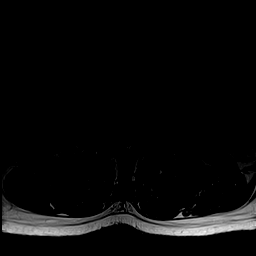
[im 28/40]
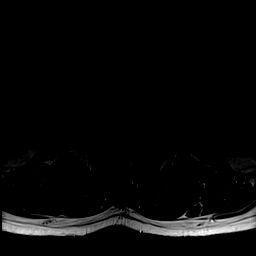
[im 32/40]
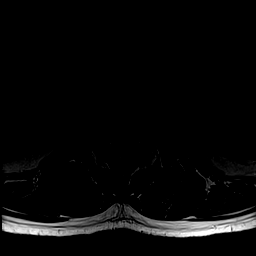
[im 36/40]
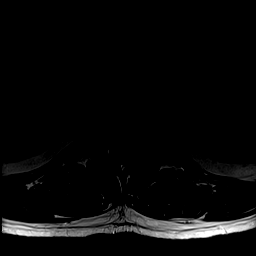
[im 40/40]
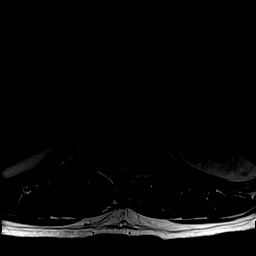

[Series 7: T2 · sagittal · 4.0mm · 0.88mm/px · 3 of 12 slices shown (2 of 2)]
[im 1/12]
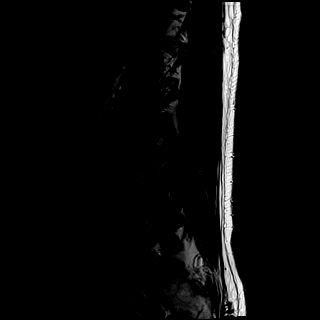
[im 6/12]
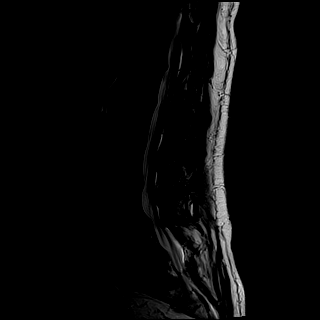
[im 12/12]
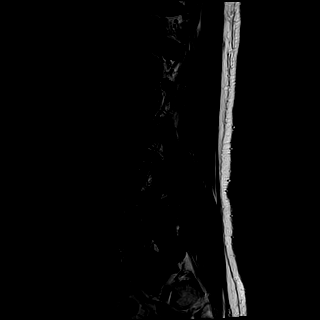

[Series 8: T1 post-contrast · axial · 4.0mm · 0.70mm/px · z∈[-131,+80]mm · 11 of 40 slices shown]
[im 1/40]
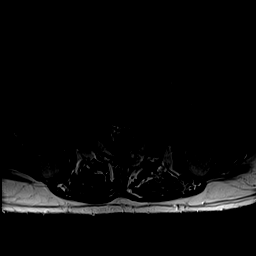
[im 4/40]
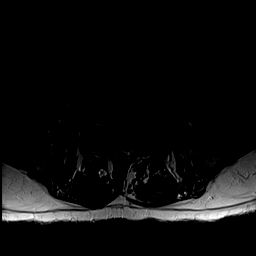
[im 8/40]
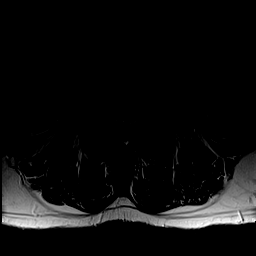
[im 12/40]
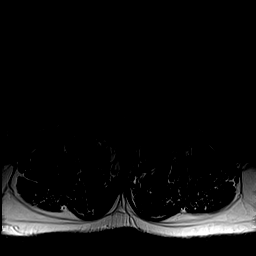
[im 16/40]
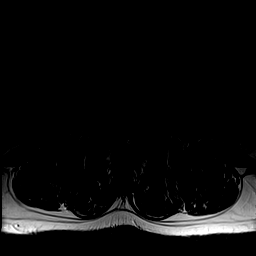
[im 20/40]
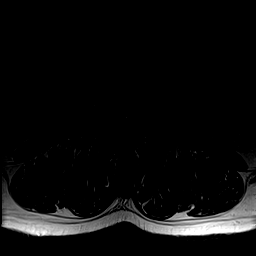
[im 24/40]
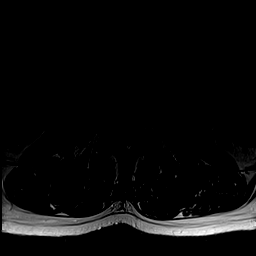
[im 28/40]
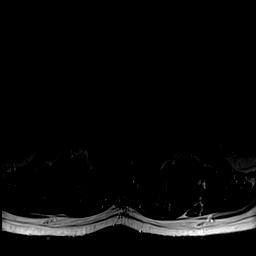
[im 32/40]
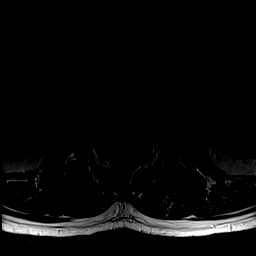
[im 36/40]
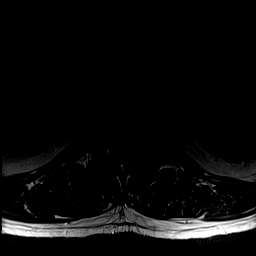
[im 40/40]
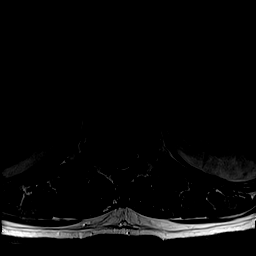

[Series 9: T1 fat-sat post-contrast · sagittal · 4.0mm · 0.88mm/px · 3 of 12 slices shown]
[im 1/12]
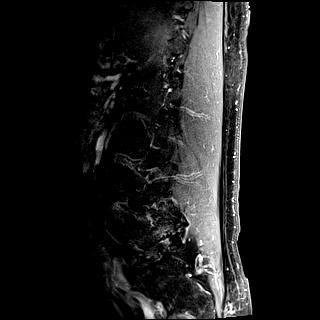
[im 6/12]
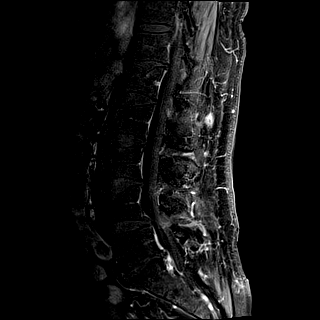
[im 12/12]
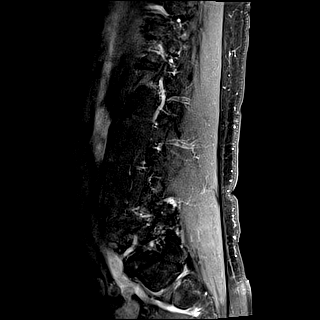

[48 of 48 positions shown; findings below may reference images not displayed]

FINDINGS: Segmentation:  Normal, when compared to the chest CT.

Alignment: Relatively preserved lumbar lordosis. Grade 1
anterolisthesis of L4 on L5 measuring 6 millimeters. Mild
retrolisthesis of L5 on S1. mild retrolisthesis of T12 on L1.

Vertebrae: Chronic mild superior endplate compression at L1, L2, and
L4. Suspected superimposed L1 superior endplate Schmorl's node with
mild associated marrow edema and enhancement. The L1 level was
included on the 2014 CT and appears stable.

Background bone marrow signal is within normal limits. Mild
degenerative posterosuperior L5 endplate marrow edema.

No other No acute osseous abnormality identified. Intact visible
sacrum and SI joints.

Conus medullaris and cauda equina: Conus extends to the T12-L1
level. No lower spinal cord or conus signal abnormality. No abnormal
intradural enhancement. No dural thickening.

Paraspinal and other soft tissues: Negative visible abdominal
viscera. Mild postoperative changes to the posterior paraspinal soft
tissues on the left at L5-S1.

Disc levels:

T11-T12: Moderate to severe facet hypertrophy. Moderate bilateral
T11 foraminal stenosis greater on the left.

T12-L1: Disc desiccation. Circumferential disc bulge. Mild facet
hypertrophy greater on the left. Mild left T12 foraminal stenosis.

L1-L2: Mild disc bulge and facet hypertrophy. Borderline to mild
bilateral L1 foraminal stenosis. Superimposed abnormal signal and
enhancement in the posterior interspinous ligament which appears
degenerative (series 9, image 6 and series 3, image 6.

L2-L3: Mild foraminal and far lateral disc bulging. Mild to moderate
facet hypertrophy. Mild right L2 foraminal stenosis.

L3-L4: Circumferential disc bulge and moderate posterior element
hypertrophy. Mild spinal and bilateral lateral recess stenosis. Mild
to moderate bilateral L3 foraminal stenosis.

L4-L5: Anterolisthesis with circumferential disc bulge and severe
posterior element hypertrophy. Mild spinal and mild to moderate
bilateral lateral recess stenosis (L5 nerve levels). Moderate right
and mild left L4 foraminal stenosis.

L5-S1: Severe disc space loss with circumferential disc osteophyte
complex. Mild to moderate posterior element hypertrophy with
previous laminectomy. Mild postoperative architectural distortion at
the left lateral recess. No spinal or lateral recess stenosis.
Moderate left and mild to moderate right L5 foraminal stenosis.
IMPRESSION: 1. Benign-appearing mild chronic lumbar compression fractures L1,
L2, and L4. Superimposed degenerative L1 superior endplate Schmorl's
node suspected, along with degenerative inflammation in the
posterior L1-L2 interspinous ligament.
2. Chronic postoperative changes posteriorly at L5-S1. Mild
multifactorial spinal stenosis at L3-L4 and L4-L5 with up to
moderate lateral recess stenosis.
3. Moderate multifactorial neural foraminal stenosis at the
bilateral T11, bilateral L3, right L4 and bilateral L5 nerve levels.

## 2020-01-22 ENCOUNTER — Ambulatory Visit (INDEPENDENT_AMBULATORY_CARE_PROVIDER_SITE_OTHER): Payer: BC Managed Care – PPO | Admitting: Family Medicine

## 2020-01-22 ENCOUNTER — Other Ambulatory Visit: Payer: Self-pay

## 2020-01-22 ENCOUNTER — Encounter: Payer: Self-pay | Admitting: Family Medicine

## 2020-01-22 VITALS — BP 144/74 | HR 69 | Temp 98.4°F | Ht 68.0 in | Wt 185.0 lb

## 2020-01-22 DIAGNOSIS — Z0001 Encounter for general adult medical examination with abnormal findings: Secondary | ICD-10-CM | POA: Diagnosis not present

## 2020-01-22 DIAGNOSIS — E785 Hyperlipidemia, unspecified: Secondary | ICD-10-CM

## 2020-01-22 DIAGNOSIS — C61 Malignant neoplasm of prostate: Secondary | ICD-10-CM

## 2020-01-22 DIAGNOSIS — E119 Type 2 diabetes mellitus without complications: Secondary | ICD-10-CM | POA: Diagnosis not present

## 2020-01-22 DIAGNOSIS — E1169 Type 2 diabetes mellitus with other specified complication: Secondary | ICD-10-CM | POA: Diagnosis not present

## 2020-01-22 DIAGNOSIS — M2550 Pain in unspecified joint: Secondary | ICD-10-CM

## 2020-01-22 LAB — MICROALBUMIN / CREATININE URINE RATIO
Creatinine,U: 116.4 mg/dL
Microalb Creat Ratio: 3.9 mg/g (ref 0.0–30.0)
Microalb, Ur: 4.6 mg/dL — ABNORMAL HIGH (ref 0.0–1.9)

## 2020-01-22 LAB — COMPREHENSIVE METABOLIC PANEL
ALT: 24 U/L (ref 0–53)
AST: 23 U/L (ref 0–37)
Albumin: 4.8 g/dL (ref 3.5–5.2)
Alkaline Phosphatase: 54 U/L (ref 39–117)
BUN: 16 mg/dL (ref 6–23)
CO2: 26 mEq/L (ref 19–32)
Calcium: 9.7 mg/dL (ref 8.4–10.5)
Chloride: 104 mEq/L (ref 96–112)
Creatinine, Ser: 0.98 mg/dL (ref 0.40–1.50)
GFR: 76.79 mL/min (ref >60.00)
Glucose, Bld: 137 mg/dL — ABNORMAL HIGH (ref 70–99)
Potassium: 4.2 mEq/L (ref 3.5–5.1)
Sodium: 138 mEq/L (ref 135–145)
Total Bilirubin: 0.7 mg/dL (ref 0.2–1.2)
Total Protein: 7.1 g/dL (ref 6.0–8.3)

## 2020-01-22 LAB — TSH: TSH: 3.22 u[IU]/mL (ref 0.35–4.50)

## 2020-01-22 LAB — CBC
HCT: 43.1 % (ref 39.0–52.0)
Hemoglobin: 15.1 g/dL (ref 13.0–17.0)
MCHC: 35 g/dL (ref 30.0–36.0)
MCV: 93.8 fl (ref 78.0–100.0)
Platelets: 157 10*3/uL (ref 150.0–400.0)
RBC: 4.6 Mil/uL (ref 4.22–5.81)
RDW: 13.1 % (ref 11.5–15.5)
WBC: 8.7 10*3/uL (ref 4.0–10.5)

## 2020-01-22 LAB — POCT GLYCOSYLATED HEMOGLOBIN (HGB A1C): Hemoglobin A1C: 6.4 % — AB (ref 4.0–5.6)

## 2020-01-22 LAB — LIPID PANEL
Cholesterol: 122 mg/dL (ref 0–200)
HDL: 22.3 mg/dL — ABNORMAL LOW (ref >39.00)
NonHDL: 99.37
Total CHOL/HDL Ratio: 5
Triglycerides: 352 mg/dL — ABNORMAL HIGH (ref 0.0–149.0)
VLDL: 70.4 mg/dL — ABNORMAL HIGH (ref 0.0–40.0)

## 2020-01-22 LAB — PSA: PSA: 0 ng/mL — ABNORMAL LOW (ref 0.10–4.00)

## 2020-01-22 LAB — LDL CHOLESTEROL, DIRECT: Direct LDL: 49 mg/dL

## 2020-01-22 NOTE — Assessment & Plan Note (Signed)
Likely osteoarthritis.  Patient does not want plain film of his hip today.  He will continue over-the-counter Tylenol and ibuprofen.  May need referral to sports med if symptoms worsen.

## 2020-01-22 NOTE — Assessment & Plan Note (Signed)
Check PSA. ?

## 2020-01-22 NOTE — Progress Notes (Signed)
Please inform patient of the following:  Blood work is all STABLE. Would like for him to keep up the good work and we can recheck in 6 months.  Gregory Jensen. [name], MD 01/22/2020 4:13 PM

## 2020-01-22 NOTE — Assessment & Plan Note (Signed)
A1c stable at 6.4.  Continue Metformin 500 mg daily.  Check CBC, C met, TSH, and urine microalbumin.  Follow-up in 6 months.

## 2020-01-22 NOTE — Patient Instructions (Signed)
It was very nice to see you today!  Your blood sugar looks good. We we will check blood work and urine sample today.  You have arthritis in your hip.  You can use ibuprofen and Tylenol as needed.  You can also try using over-the-counter Voltaren gel.  I will see you back in 6 months.  Please come back to see me sooner if needed.  Take care, Dr [name]  Please try these tips to maintain a healthy lifestyle:   Eat at least 3 REAL meals and 1-2 snacks per day.  Aim for no more than 5 hours between eating.  If you eat breakfast, please do so within one hour of getting up.    Each meal should contain half fruits/vegetables, one quarter protein, and one quarter carbs (no bigger than a computer mouse)   Cut down on sweet beverages. This includes juice, soda, and sweet tea.     Drink at least 1 glass of water with each meal and aim for at least 8 glasses per day   Exercise at least 150 minutes every week.    Preventive Care 10-31 Years Old, Male Preventive care refers to lifestyle choices and visits with your health care provider that can promote health and wellness. This includes:  A yearly physical exam. This is also called an annual well check.  Regular dental and eye exams.  Immunizations.  Screening for certain conditions.  Healthy lifestyle choices, such as eating a healthy diet, getting regular exercise, not using drugs or products that contain nicotine and tobacco, and limiting alcohol use. What can I expect for my preventive care visit? Physical exam Your health care provider will check:  Height and weight. These may be used to calculate body mass index (BMI), which is a measurement that tells if you are at a healthy weight.  Heart rate and blood pressure.  Your skin for abnormal spots. Counseling Your health care provider may ask you questions about:  Alcohol, tobacco, and drug use.  Emotional well-being.  Home and relationship well-being.  Sexual  activity.  Eating habits.  Work and work Statistician. What immunizations do I need?  Influenza (flu) vaccine  This is recommended every year. Tetanus, diphtheria, and pertussis (Tdap) vaccine  You may need a Td booster every 10 years. Varicella (chickenpox) vaccine  You may need this vaccine if you have not already been vaccinated. Zoster (shingles) vaccine  You may need this after age 47. Measles, mumps, and rubella (MMR) vaccine  You may need at least one dose of MMR if you were born in 1957 or later. You may also need a second dose. Pneumococcal conjugate (PCV13) vaccine  You may need this if you have certain conditions and were not previously vaccinated. Pneumococcal polysaccharide (PPSV23) vaccine  You may need one or two doses if you smoke cigarettes or if you have certain conditions. Meningococcal conjugate (MenACWY) vaccine  You may need this if you have certain conditions. Hepatitis A vaccine  You may need this if you have certain conditions or if you travel or work in places where you may be exposed to hepatitis A. Hepatitis B vaccine  You may need this if you have certain conditions or if you travel or work in places where you may be exposed to hepatitis B. Haemophilus influenzae type b (Hib) vaccine  You may need this if you have certain risk factors. Human papillomavirus (HPV) vaccine  If recommended by your health care provider, you may need three doses over  6 months. You may receive vaccines as individual doses or as more than one vaccine together in one shot (combination vaccines). Talk with your health care provider about the risks and benefits of combination vaccines. What tests do I need? Blood tests  Lipid and cholesterol levels. These may be checked every 5 years, or more frequently if you are over 38 years old.  Hepatitis C test.  Hepatitis B test. Screening  Lung cancer screening. You may have this screening every year starting at age 54 if  you have a 30-pack-year history of smoking and currently smoke or have quit within the past 15 years.  Prostate cancer screening. Recommendations will vary depending on your family history and other risks.  Colorectal cancer screening. All adults should have this screening starting at age 64 and continuing until age 1. Your health care provider may recommend screening at age 35 if you are at increased risk. You will have tests every 1-10 years, depending on your results and the type of screening test.  Diabetes screening. This is done by checking your blood sugar (glucose) after you have not eaten for a while (fasting). You may have this done every 1-3 years.  Sexually transmitted disease (STD) testing. Follow these instructions at home: Eating and drinking  Eat a diet that includes fresh fruits and vegetables, whole grains, lean protein, and low-fat dairy products.  Take vitamin and mineral supplements as recommended by your health care provider.  Do not drink alcohol if your health care provider tells you not to drink.  If you drink alcohol: ? Limit how much you have to 0-2 drinks a day. ? Be aware of how much alcohol is in your drink. In the [place] one drink equals one 12 oz bottle of beer (355 mL), one 5 oz glass of wine (148 mL), or one 1 oz glass of hard liquor (44 mL). Lifestyle  Take daily care of your teeth and gums.  Stay active. Exercise for at least 30 minutes on 5 or more days each week.  Do not use any products that contain nicotine or tobacco, such as cigarettes, e-cigarettes, and chewing tobacco. If you need help quitting, ask your health care provider.  If you are sexually active, practice safe sex. Use a condom or other form of protection to prevent STIs (sexually transmitted infections).  Talk with your health care provider about taking a low-dose aspirin every day starting at age 76. What's next?  Go to your health care provider once a year for a well check  visit.  Ask your health care provider how often you should have your eyes and teeth checked.  Stay up to date on all vaccines. This information is not intended to replace advice given to you by your health care provider. Make sure you discuss any questions you have with your health care provider. Document Revised: 07/27/2018 Document Reviewed: 07/27/2018 Elsevier Patient Education  2020 [employer].

## 2020-01-22 NOTE — Progress Notes (Signed)
Chief Complaint:  Gregory Jensen is a 64 y.o. male who presents today for his annual comprehensive physical exam.    Assessment/Plan:  Chronic Problems Addressed Today: Diabetes mellitus without complication (HCC) W5I stable at 6.4.  Continue Metformin 500 mg daily.  Check CBC, C met, TSH, and urine microalbumin.  Follow-up in 6 months.  Joint pain Likely osteoarthritis.  Patient does not want plain film of his hip today.  He will continue over-the-counter Tylenol and ibuprofen.  May need referral to sports med if symptoms worsen.  Dyslipidemia associated with type 2 diabetes mellitus (HCC) Check lipid panel.  He has not been taking fenofibrate for the past week.  He does not want to go back on this medication.  Would consider increasing dose of Lipitor to 80 mg daily depending on lipid panel.  Prostate cancer Check PSA.   Body mass index is 28.13 kg/m. / Overweight    Preventative Healthcare: Check CBC, C met, TSH, lipid panel, PSA.  Due for colonoscopy next year.  Patient Counseling(The following topics were reviewed and/or handout was given):  -Nutrition: Stressed importance of moderation in sodium/caffeine intake, saturated fat and cholesterol, caloric balance, sufficient intake of fresh fruits, vegetables, and fiber.  -Stressed the importance of regular exercise.   -Substance Abuse: Discussed cessation/primary prevention of tobacco, alcohol, or other drug use; driving or other dangerous activities under the influence; availability of treatment for abuse.   -Injury prevention: Discussed safety belts, safety helmets, smoke detector, smoking near bedding or upholstery.   -Sexuality: Discussed sexually transmitted diseases, partner selection, use of condoms, avoidance of unintended pregnancy and contraceptive alternatives.   -Dental health: Discussed importance of regular tooth brushing, flossing, and dental visits.  -Health maintenance and immunizations reviewed. Please  refer to Health maintenance section.  Return to care in 1 year for next preventative visit.     Subjective:  HPI:  He has no acute complaints today.   Left hip pain for the last 6 months. Worsened over the last couple of weeks. Worse with certain motions. Tried ibuprifen and tylenol which helps.   Lifestyle Diet: Balanced. Cutting out sodas.  Exercise: Stay busy at work.   Depression screen [facility] 2/9 07/23/2019  Decreased Interest 0  Down, Depressed, Hopeless 0  PHQ - 2 Score 0  Altered sleeping 0  Tired, decreased energy 1  Change in appetite 0  Feeling bad or failure about yourself  0  Trouble concentrating 0  Moving slowly or fidgety/restless 0  Suicidal thoughts 0  PHQ-9 Score 1  Difficult doing work/chores Not difficult at all    Health Maintenance Due  Topic Date Due  . OPHTHALMOLOGY EXAM  07/28/2018  . URINE MICROALBUMIN  01/17/2019  . FOOT EXAM  01/18/2020     ROS: Per HPI, otherwise a complete review of systems was negative.   PMH:  The following were reviewed and entered/updated in epic: Past Medical History:  Diagnosis Date  . Cancer ([place]) 4 years ago   Prostate Cancer   . Diabetes mellitus without complication ([place])   . Hyperlipidemia   . Sleep apnea   . Smoker    Patient Active Problem List   Diagnosis Date Noted  . Joint pain 07/23/2019  . Coronary artery calcification 04/19/2018  . History of colonic polyps 01/14/2017  . Diabetes mellitus without complication ([place]) 62/70/3500  . Prostate cancer ([place]) 09/22/2010  . Previous back surgery 09/22/2010  . Carpal tunnel syndrome 09/22/2010  . Nicotine dependence with current use 09/22/2010  .  Dyslipidemia associated with type 2 diabetes mellitus ([place]) 09/22/2010   Past Surgical History:  Procedure Laterality Date  . BACK SURGERY  15 years ago  . COLONOSCOPY  2007 (at age 59)   pt does not know MD name/normal exam per pt.  Marland Kitchen PROSTATECTOMY  4 years ago    Family History  Problem Relation Age  of Onset  . Heart disease Father   . Alzheimer's disease Mother   . Diabetes Other        family hx  . Hypertension Sister   . Heart disease Sister   . Hypertension Sister   . Stomach cancer Brother   . Colon cancer Neg Hx     Medications- reviewed and updated Current Outpatient Medications  Medication Sig Dispense Refill  . aspirin 81 MG EC tablet Take 81 mg by mouth daily.      Marland Kitchen atorvastatin (LIPITOR) 40 MG tablet TAKE 1 TABLET(40 MG) BY MOUTH DAILY 90 tablet 4  . glucose blood (ONETOUCH VERIO) test strip 1 each by Other route daily as needed for other. Use as instructed 100 each 4  . metFORMIN (GLUCOPHAGE) 500 MG tablet TAKE 1 TABLET BY MOUTH ONCE DAILY WITH A MEAL 90 tablet 0  . ONETOUCH DELICA LANCETS 42P MISC 1 each by Does not apply route daily as needed. 100 each 4   No current facility-administered medications for this visit.    Allergies-reviewed and updated No Known Allergies  Social History   Socioeconomic History  . Marital status: Significant Other    Spouse name: [name]  . Number of children: 1  . Years of education: 61  . Highest education level: Not on file  Occupational History  . Occupation: [employer]  Tobacco Use  . Smoking status: Current Every Day Smoker    Packs/day: 1.25    Years: 47.00    Pack years: 58.75    Types: Cigarettes  . Smokeless tobacco: Never Used  Substance and Sexual Activity  . Alcohol use: No    Alcohol/week: 0.0 standard drinks  . Drug use: No  . Sexual activity: Not on file  Other Topics Concern  . Not on file  Social History Narrative   Lives with wife. Has one son who lives in [place].    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     Social Determinants of Health   Financial Resource Strain:   . Difficulty of Paying Living Expenses:   Food Insecurity:   . Worried About Charity fundraiser in the Last Year:   . Arboriculturist in the Last Year:   Transportation Needs:   .  Film/video editor (Medical):   Marland Kitchen Lack of Transportation (Non-Medical):   Physical Activity:   . Days of Exercise per Week:   . Minutes of Exercise per Session:   Stress:   . Feeling of Stress :   Social Connections:   . Frequency of Communication with Friends and Family:   . Frequency of Social Gatherings with Friends and Family:   . Attends Religious Services:   . Active Member of Clubs or Organizations:   . Attends Archivist Meetings:   Marland Kitchen Marital Status:         Objective:  Physical Exam: BP (!) 144/74   Pulse 69   Temp 98.4 F (36.9 C)   Ht _0  (1.727 m)   Wt 185 lb (83.9 kg)   SpO2 97%  BMI 28.13 kg/m   Body mass index is 28.13 kg/m. Wt Readings from Last 3 Encounters:  01/22/20 185 lb (83.9 kg)  10/12/19 187 lb (84.8 kg)  07/23/19 190 lb (86.2 kg)   Gen: NAD, resting comfortably HEENT: TMs normal bilaterally. OP clear. No thyromegaly noted.  CV: RRR with no murmurs appreciated Pulm: NWOB, CTAB with no crackles, wheezes, or rhonchi GI: Normal bowel sounds present. Soft, Nontender, Nondistended. MSK: no edema, cyanosis, or clubbing noted.  Left hip with slightly limited range of motion.  Neurovascular intact distally. Skin: warm, dry Neuro: CN2-12 grossly intact. Strength 5/5 in upper and lower extremities. Reflexes symmetric and intact bilaterally.  Psych: Normal affect and thought content     Yovana Scogin M. [name], MD 01/22/2020 8:32 AM

## 2020-01-22 NOTE — Assessment & Plan Note (Signed)
Check lipid panel.  He has not been taking fenofibrate for the past week.  He does not want to go back on this medication.  Would consider increasing dose of Lipitor to 80 mg daily depending on lipid panel.

## 2020-02-04 ENCOUNTER — Other Ambulatory Visit: Payer: Self-pay | Admitting: Acute Care

## 2020-02-06 ENCOUNTER — Ambulatory Visit
Admission: RE | Admit: 2020-02-06 | Discharge: 2020-02-06 | Disposition: A | Discharge status: Home / Self Care | Payer: BC Managed Care – PPO | Source: Ambulatory Visit | Attending: Acute Care | Admitting: Acute Care

## 2020-02-06 DIAGNOSIS — F17210 Nicotine dependence, cigarettes, uncomplicated: Secondary | ICD-10-CM

## 2020-02-06 DIAGNOSIS — Z122 Encounter for screening for malignant neoplasm of respiratory organs: Secondary | ICD-10-CM

## 2020-02-06 DIAGNOSIS — Z87891 Personal history of nicotine dependence: Secondary | ICD-10-CM | POA: Diagnosis not present

## 2020-02-07 ENCOUNTER — Other Ambulatory Visit: Payer: Self-pay | Admitting: *Deleted

## 2020-02-07 DIAGNOSIS — F17210 Nicotine dependence, cigarettes, uncomplicated: Secondary | ICD-10-CM

## 2020-02-07 NOTE — Progress Notes (Signed)
Please call patient and let them  know their  low dose Ct was read as a Lung RADS 2: nodules that are benign in appearance and behavior with a very low likelihood of becoming a clinically active cancer due to size or lack of growth. Recommendation per radiology is for a repeat LDCT in 12 months. Please let them  know we will order and schedule their  annual screening scan for 01/2021. Please let them  know there was notation of CAD on their  scan.  Please remind the patient  that this is a non-gated exam therefore degree or severity of disease  cannot be determined. Please have them  follow up with their PCP regarding potential risk factor modification, dietary therapy or pharmacologic therapy if clinically indicated. Pt.  is  currently on statin therapy. Please place order for annual  screening scan for  01/2021 and fax results to PCP. Thanks so much.

## 2020-02-28 ENCOUNTER — Other Ambulatory Visit: Payer: Self-pay | Admitting: Family Medicine

## 2020-03-27 ENCOUNTER — Other Ambulatory Visit: Payer: Self-pay

## 2020-03-27 DIAGNOSIS — E1169 Type 2 diabetes mellitus with other specified complication: Secondary | ICD-10-CM

## 2020-03-27 DIAGNOSIS — E785 Hyperlipidemia, unspecified: Secondary | ICD-10-CM

## 2020-03-27 MED ORDER — ATORVASTATIN CALCIUM 40 MG PO TABS: ORAL_TABLET | ORAL | 4 refills | Status: DC

## 2020-03-27 MED ORDER — BLOOD GLUCOSE MONITOR KIT: PACK | 0 refills | Status: DC

## 2020-03-31 ENCOUNTER — Other Ambulatory Visit: Payer: Self-pay | Admitting: *Deleted

## 2020-05-10 ENCOUNTER — Other Ambulatory Visit: Payer: Self-pay | Admitting: *Deleted

## 2020-05-10 MED ORDER — METFORMIN HCL 500 MG PO TABS: ORAL_TABLET | ORAL | 1 refills | Status: DC

## 2020-06-11 ENCOUNTER — Other Ambulatory Visit: Payer: Self-pay | Admitting: Family Medicine

## 2020-06-11 DIAGNOSIS — E1169 Type 2 diabetes mellitus with other specified complication: Secondary | ICD-10-CM

## 2020-07-23 ENCOUNTER — Ambulatory Visit (INDEPENDENT_AMBULATORY_CARE_PROVIDER_SITE_OTHER): Payer: BC Managed Care – PPO | Admitting: Family Medicine

## 2020-07-23 ENCOUNTER — Other Ambulatory Visit: Payer: Self-pay

## 2020-07-23 ENCOUNTER — Encounter: Payer: Self-pay | Admitting: Family Medicine

## 2020-07-23 VITALS — BP 144/75 | HR 71 | Temp 98.4°F | Ht 68.0 in | Wt 188.0 lb

## 2020-07-23 DIAGNOSIS — E119 Type 2 diabetes mellitus without complications: Secondary | ICD-10-CM

## 2020-07-23 DIAGNOSIS — E1169 Type 2 diabetes mellitus with other specified complication: Secondary | ICD-10-CM

## 2020-07-23 DIAGNOSIS — M2550 Pain in unspecified joint: Secondary | ICD-10-CM

## 2020-07-23 DIAGNOSIS — L570 Actinic keratosis: Secondary | ICD-10-CM | POA: Insufficient documentation

## 2020-07-23 DIAGNOSIS — C61 Malignant neoplasm of prostate: Secondary | ICD-10-CM | POA: Diagnosis not present

## 2020-07-23 DIAGNOSIS — F17200 Nicotine dependence, unspecified, uncomplicated: Secondary | ICD-10-CM | POA: Diagnosis not present

## 2020-07-23 DIAGNOSIS — E785 Hyperlipidemia, unspecified: Secondary | ICD-10-CM | POA: Diagnosis not present

## 2020-07-23 NOTE — Assessment & Plan Note (Signed)
Worsened recently.  Likely has underlying osteoarthritis.  Recommended over-the-counter Voltaren gel.  Would consider referral to sports medicine if continues to be problematic.  Patient deferred for today.

## 2020-07-23 NOTE — Assessment & Plan Note (Signed)
Tolerating Metformin 500 mg daily.  Check A1c, CBC, CMET, TSH.  Follow-up in 6 months.

## 2020-07-23 NOTE — Assessment & Plan Note (Signed)
Cryotherapy applied today.  See below procedure note.  He tolerated well.

## 2020-07-23 NOTE — Assessment & Plan Note (Signed)
Patient was asked about his tobacco use today and was strongly advised to quit. Patient is currently Contemplative. We reviewed treatment options to assist him quit smoking including NRT, Chantix, and Bupropion. Follow up at next office visit.   Total time spent counseling approximately 3 minutes.

## 2020-07-23 NOTE — Assessment & Plan Note (Signed)
Check PSa.

## 2020-07-23 NOTE — Patient Instructions (Signed)
It was very nice to see you today!  We froze the spot on your head.  Please try the voltaren gel for your aches and pains.   We will check blood work today.  I will see you back in 6 months for your annual physical with blood work.  Please come back to see me sooner if needed.  Take care, Dr [name]  Please try these tips to maintain a healthy lifestyle:   Eat at least 3 REAL meals and 1-2 snacks per day.  Aim for no more than 5 hours between eating.  If you eat breakfast, please do so within one hour of getting up.    Each meal should contain half fruits/vegetables, one quarter protein, and one quarter carbs (no bigger than a computer mouse)   Cut down on sweet beverages. This includes juice, soda, and sweet tea.     Drink at least 1 glass of water with each meal and aim for at least 8 glasses per day   Exercise at least 150 minutes every week.

## 2020-07-23 NOTE — Assessment & Plan Note (Signed)
Check lipids, CBC, CMET, TSH.  Continue Lipitor 40 mg daily.

## 2020-07-23 NOTE — Progress Notes (Signed)
   Gregory Jensen is a 64 y.o. male who presents today for an office visit.  Assessment/Plan:  Chronic Problems Addressed Today: Diabetes mellitus without complication (HCC) Tolerating Metformin 500 mg daily.  Check A1c, CBC, CMET, TSH.  Follow-up in 6 months.  Joint pain Worsened recently.  Likely has underlying osteoarthritis.  Recommended over-the-counter Voltaren gel.  Would consider referral to sports medicine if continues to be problematic.  Patient deferred for today.  Dyslipidemia associated with type 2 diabetes mellitus (HCC) Check lipids, CBC, CMET, TSH.  Continue Lipitor 40 mg daily.  Nicotine dependence with current use Patient was asked about his tobacco use today and was strongly advised to quit. Patient is currently Contemplative. We reviewed treatment options to assist him quit smoking including NRT, Chantix, and Bupropion. Follow up at next office visit.   Total time spent counseling approximately 3 minutes.    Prostate cancer Check PSa.   Actinic keratosis Cryotherapy applied today.  See below procedure note.  He tolerated well.     Subjective:  HPI:  See A/p.         Objective:  Physical Exam: BP (!) 144/75   Pulse 71   Temp 98.4 F (36.9 C) (Temporal)   Ht 5\' 8"  (1.727 m)   Wt 188 lb (85.3 kg)   SpO2 98%   BMI 28.59 kg/m   Gen: No acute distress, resting comfortably CV: Regular rate and rhythm with no murmurs appreciated Pulm: Normal work of breathing, clear to auscultation bilaterally with no crackles, wheezes, or rhonchi Skin: AK on scalp.  Neuro: Grossly normal, moves all extremities Psych: Normal affect and thought content  Cryotherapy Procedure Note  Pre-operative Diagnosis: Actinic keratosis  Locations: Scalp  Indications: Therapeutic  Procedure Details  Patient informed of risks (permanent scarring, infection, light or dark discoloration, bleeding, infection, weakness, numbness and recurrence of the lesion) and benefits of  the procedure and verbal informed consent obtained.  The areas are treated with liquid nitrogen therapy, frozen until ice ball extended 2 mm beyond lesion, allowed to thaw, and treated again. The patient tolerated procedure well.  The patient was instructed on post-op care, warned that there may be blister formation, redness and pain. Recommend OTC analgesia as needed for pain.  Condition: Stable  Complications: none.        Algis Greenhouse. [name], MD 07/23/2020 8:25 AM

## 2020-07-24 LAB — COMPREHENSIVE METABOLIC PANEL
AG Ratio: 1.8 (calc) (ref 1.0–2.5)
ALT: 22 U/L (ref 9–46)
AST: 19 U/L (ref 10–35)
Albumin: 4.7 g/dL (ref 3.6–5.1)
Alkaline phosphatase (APISO): 83 U/L (ref 35–144)
BUN: 15 mg/dL (ref 7–25)
CO2: 27 mmol/L (ref 20–32)
Calcium: 10.1 mg/dL (ref 8.6–10.3)
Chloride: 102 mmol/L (ref 98–110)
Creat: 0.97 mg/dL (ref 0.70–1.25)
Globulin: 2.6 g/dL (calc) (ref 1.9–3.7)
Glucose, Bld: 149 mg/dL — ABNORMAL HIGH (ref 65–99)
Potassium: 4.3 mmol/L (ref 3.5–5.3)
Sodium: 137 mmol/L (ref 135–146)
Total Bilirubin: 0.5 mg/dL (ref 0.2–1.2)
Total Protein: 7.3 g/dL (ref 6.1–8.1)

## 2020-07-24 LAB — CBC
HCT: 45.7 % (ref 38.5–50.0)
Hemoglobin: 15.9 g/dL (ref 13.2–17.1)
MCH: 32.4 pg (ref 27.0–33.0)
MCHC: 34.8 g/dL (ref 32.0–36.0)
MCV: 93.1 fL (ref 80.0–100.0)
MPV: 12.3 fL (ref 7.5–12.5)
Platelets: 174 10*3/uL (ref 140–400)
RBC: 4.91 10*6/uL (ref 4.20–5.80)
RDW: 12.6 % (ref 11.0–15.0)
WBC: 8.3 10*3/uL (ref 3.8–10.8)

## 2020-07-24 LAB — LIPID PANEL
Cholesterol: 186 mg/dL (ref <200)
HDL: 17 mg/dL — ABNORMAL LOW (ref >=40)
Non-HDL Cholesterol (Calc): 169 mg/dL (calc) — ABNORMAL HIGH (ref <130)
Total CHOL/HDL Ratio: 10.9 (calc) — ABNORMAL HIGH (ref <5.0)
Triglycerides: 1142 mg/dL — ABNORMAL HIGH (ref <150)

## 2020-07-24 LAB — HEMOGLOBIN A1C
Hgb A1c MFr Bld: 6.6 % of total Hgb — ABNORMAL HIGH (ref <5.7)
Mean Plasma Glucose: 143 mg/dL
eAG (mmol/L): 7.9 mmol/L

## 2020-07-24 LAB — TSH: TSH: 4.03 mIU/L (ref 0.40–4.50)

## 2020-07-24 LAB — PSA: PSA: 0.04 ng/mL (ref <=4.0)

## 2020-07-25 NOTE — Progress Notes (Signed)
Please inform patient of the following:  PSA was slightly up from his baseline. This could be a lab error. We can either have him come back in a week or two to recheck or send him back to urology.  Triglycerides were high and his "good" cholesterol dropped a bit. Recommend increasing lipitor to 80mg  daily. We can recheck in 6 months.  Everything else is stable.  Gregory Jensen. [name], MD 07/25/2020 9:03 AM

## 2020-07-29 ENCOUNTER — Other Ambulatory Visit: Payer: Self-pay

## 2020-07-29 DIAGNOSIS — C61 Malignant neoplasm of prostate: Secondary | ICD-10-CM

## 2020-08-01 ENCOUNTER — Other Ambulatory Visit: Payer: Self-pay | Admitting: Family Medicine

## 2020-08-01 DIAGNOSIS — E1169 Type 2 diabetes mellitus with other specified complication: Secondary | ICD-10-CM

## 2020-08-01 DIAGNOSIS — E785 Hyperlipidemia, unspecified: Secondary | ICD-10-CM

## 2020-08-05 ENCOUNTER — Other Ambulatory Visit: Payer: BC Managed Care – PPO

## 2020-08-05 ENCOUNTER — Telehealth: Payer: Self-pay

## 2020-08-05 ENCOUNTER — Other Ambulatory Visit: Payer: Self-pay

## 2020-08-05 DIAGNOSIS — C61 Malignant neoplasm of prostate: Secondary | ICD-10-CM

## 2020-08-05 DIAGNOSIS — E1169 Type 2 diabetes mellitus with other specified complication: Secondary | ICD-10-CM

## 2020-08-05 LAB — PSA: PSA: 0.05 ng/mL (ref <=4.0)

## 2020-08-05 NOTE — Telephone Encounter (Signed)
Pt states he needs a refill for his lipitor. Pt states Dr. [name] told him to start taking 2 a day so his prescription ended earlier than it says on the script. Please advise.

## 2020-08-06 ENCOUNTER — Other Ambulatory Visit: Payer: Self-pay | Admitting: *Deleted

## 2020-08-06 DIAGNOSIS — R972 Elevated prostate specific antigen [PSA]: Secondary | ICD-10-CM

## 2020-08-06 DIAGNOSIS — E1169 Type 2 diabetes mellitus with other specified complication: Secondary | ICD-10-CM

## 2020-08-06 MED ORDER — ATORVASTATIN CALCIUM 80 MG PO TABS: ORAL_TABLET | ORAL | 1 refills | Status: DC

## 2020-08-06 MED ORDER — ATORVASTATIN CALCIUM 80 MG PO TABS: ORAL_TABLET | ORAL | 3 refills | Status: DC

## 2020-08-06 NOTE — Telephone Encounter (Signed)
LVM to return call    Lipitor 80 mg refill, take one tablet daily

## 2020-08-06 NOTE — Telephone Encounter (Signed)
Please send in lipitor 80mg  once daily dispense 90 with 3 refills.  Algis Greenhouse. [name], MD 08/06/2020 10:34 AM

## 2020-08-06 NOTE — Telephone Encounter (Signed)
Rx switched to [facility]

## 2020-08-06 NOTE — Telephone Encounter (Signed)
Patient is calling in stating that the Lipitor needs to be sent to [place].

## 2020-08-06 NOTE — Progress Notes (Signed)
Please inform patient of the following:  PSA level is very low but not 0 as it has been for the last several years. I would like to send him to see urologist to take a closer look.  Please place referral if needed.

## 2020-09-23 ENCOUNTER — Other Ambulatory Visit: Payer: Self-pay | Admitting: Family Medicine

## 2021-01-22 ENCOUNTER — Encounter: Payer: Self-pay | Admitting: Family Medicine

## 2021-01-22 ENCOUNTER — Other Ambulatory Visit: Payer: Self-pay

## 2021-01-22 ENCOUNTER — Other Ambulatory Visit: Payer: Self-pay | Admitting: *Deleted

## 2021-01-22 ENCOUNTER — Ambulatory Visit: Payer: Medicare PPO | Admitting: Family Medicine

## 2021-01-22 VITALS — BP 151/78 | HR 73 | Temp 98.5°F | Ht 68.0 in | Wt 186.8 lb

## 2021-01-22 DIAGNOSIS — C61 Malignant neoplasm of prostate: Secondary | ICD-10-CM

## 2021-01-22 DIAGNOSIS — Z6828 Body mass index (BMI) 28.0-28.9, adult: Secondary | ICD-10-CM

## 2021-01-22 DIAGNOSIS — E785 Hyperlipidemia, unspecified: Secondary | ICD-10-CM

## 2021-01-22 DIAGNOSIS — G6289 Other specified polyneuropathies: Secondary | ICD-10-CM | POA: Diagnosis not present

## 2021-01-22 DIAGNOSIS — G629 Polyneuropathy, unspecified: Secondary | ICD-10-CM | POA: Insufficient documentation

## 2021-01-22 DIAGNOSIS — E1169 Type 2 diabetes mellitus with other specified complication: Secondary | ICD-10-CM

## 2021-01-22 DIAGNOSIS — E119 Type 2 diabetes mellitus without complications: Secondary | ICD-10-CM

## 2021-01-22 DIAGNOSIS — F17210 Nicotine dependence, cigarettes, uncomplicated: Secondary | ICD-10-CM

## 2021-01-22 DIAGNOSIS — E663 Overweight: Secondary | ICD-10-CM

## 2021-01-22 DIAGNOSIS — Z0001 Encounter for general adult medical examination with abnormal findings: Secondary | ICD-10-CM | POA: Diagnosis not present

## 2021-01-22 DIAGNOSIS — F17200 Nicotine dependence, unspecified, uncomplicated: Secondary | ICD-10-CM

## 2021-01-22 LAB — COMPREHENSIVE METABOLIC PANEL
ALT: 27 U/L (ref 0–53)
AST: 20 U/L (ref 0–37)
Albumin: 4.7 g/dL (ref 3.5–5.2)
Alkaline Phosphatase: 87 U/L (ref 39–117)
BUN: 17 mg/dL (ref 6–23)
CO2: 26 mEq/L (ref 19–32)
Calcium: 9.6 mg/dL (ref 8.4–10.5)
Chloride: 104 mEq/L (ref 96–112)
Creatinine, Ser: 1.01 mg/dL (ref 0.40–1.50)
GFR: 77.84 mL/min (ref >60.00)
Glucose, Bld: 157 mg/dL — ABNORMAL HIGH (ref 70–99)
Potassium: 4.5 mEq/L (ref 3.5–5.1)
Sodium: 140 mEq/L (ref 135–145)
Total Bilirubin: 0.4 mg/dL (ref 0.2–1.2)
Total Protein: 7.2 g/dL (ref 6.0–8.3)

## 2021-01-22 LAB — CBC
HCT: 42.7 % (ref 39.0–52.0)
Hemoglobin: 15.2 g/dL (ref 13.0–17.0)
MCHC: 35.6 g/dL (ref 30.0–36.0)
MCV: 91.1 fl (ref 78.0–100.0)
Platelets: 157 10*3/uL (ref 150.0–400.0)
RBC: 4.69 Mil/uL (ref 4.22–5.81)
RDW: 13.1 % (ref 11.5–15.5)
WBC: 9 10*3/uL (ref 4.0–10.5)

## 2021-01-22 LAB — LIPID PANEL
Cholesterol: 178 mg/dL (ref 0–200)
HDL: 17 mg/dL — ABNORMAL LOW (ref >39.00)
Total CHOL/HDL Ratio: 10
Triglycerides: 1417 mg/dL — ABNORMAL HIGH (ref 0.0–149.0)

## 2021-01-22 LAB — MICROALBUMIN / CREATININE URINE RATIO
Creatinine,U: 178.2 mg/dL
Microalb Creat Ratio: 5.6 mg/g (ref 0.0–30.0)
Microalb, Ur: 9.9 mg/dL — ABNORMAL HIGH (ref 0.0–1.9)

## 2021-01-22 LAB — TSH: TSH: 3.61 u[IU]/mL (ref 0.35–4.50)

## 2021-01-22 LAB — HEMOGLOBIN A1C: Hgb A1c MFr Bld: 8 % — ABNORMAL HIGH (ref 4.6–6.5)

## 2021-01-22 LAB — PSA: PSA: 0 ng/mL — ABNORMAL LOW (ref 0.10–4.00)

## 2021-01-22 LAB — LDL CHOLESTEROL, DIRECT: Direct LDL: 32 mg/dL

## 2021-01-22 MED ORDER — FENOFIBRATE 145 MG PO TABS: 145.0000 mg | ORAL_TABLET | Freq: Every day | ORAL | 1 refills | Status: DC

## 2021-01-22 NOTE — Assessment & Plan Note (Signed)
Has been following with neurology for this.  Ended up having a lumbar puncture done about a year ago which was largely unremarkable though did see elevated glucose in the CSF.  His A1c is a bit typically well controlled.  Having ongoing muscle loss and weakness in his lower extremities.  He does have some evidence of spondylolisthesis and stenosis in his lumbar spine.  He deferred further evaluation for today though at some point in the future would likely benefit from MRI of C-spine and T-spine to rule out other potential causes.  May need referral to see spine specialist.  Discussed reasons to return to care.

## 2021-01-22 NOTE — Progress Notes (Signed)
Please inform patient of the following:  His triglycerides are extremely elevated but everything else is stable.  His PSA is back down to 0.  I would like to start an additional medication for his triglycerides.  Please send fenofibrate 145 mg daily if he is willing to start.  We should recheck again in 6 months.

## 2021-01-22 NOTE — Assessment & Plan Note (Signed)
Check lipids today.  Continue Lipitor 80 mg daily.

## 2021-01-22 NOTE — Assessment & Plan Note (Signed)
Patient was asked about his tobacco use today and was strongly advised to quit. Patient is currently not contemplative. We reviewed treatment options to assist him quit smoking including NRT, Chantix, and Bupropion. Follow up at next office visit.   Total time spent counseling approximately 3 minutes.

## 2021-01-22 NOTE — Progress Notes (Signed)
Chief Complaint:  Gregory Jensen is a 65 y.o. male who presents today for his annual comprehensive physical exam.    Assessment/Plan:  Chronic Problems Addressed Today: Diabetes mellitus without complication ([place]) On metformin 500 mg daily.  Check labs today including A1c.  Follow-up 6 months.  Discussed lifestyle modifications.    Peripheral neuropathy Has been following with neurology for this.  Ended up having a lumbar puncture done about a year ago which was largely unremarkable though did see elevated glucose in the CSF.  His A1c is a bit typically well controlled.  Having ongoing muscle loss and weakness in his lower extremities.  He does have some evidence of spondylolisthesis and stenosis in his lumbar spine.  He deferred further evaluation for today though at some point in the future would likely benefit from MRI of C-spine and T-spine to rule out other potential causes.  May need referral to see spine specialist.  Discussed reasons to return to care.  Dyslipidemia associated with type 2 diabetes mellitus (H[facility]) Check lipids today.  Continue Lipitor 80 mg daily.  Prostate cancer  check PSA.  Will be following up with urology in few weeks.    Nicotine dependence with current use Patient was asked about his tobacco use today and was strongly advised to quit. Patient is currently not contemplative. We reviewed treatment options to assist him quit smoking including NRT, Chantix, and Bupropion. Follow up at next office visit.   Total time spent counseling approximately 3 minutes.    Body mass index is 28.4 kg/m. Gregory Jensen  BMI Metric Follow Up - 01/22/21 0907       BMI Metric Follow Up-Please document annually   BMI Metric Follow Up Education provided              Preventative Healthcare: Check labs today.  Due for colon cancer screening later this year.  Patient Counseling(The following topics were reviewed and/or handout was given):  -Nutrition: Stressed  importance of moderation in sodium/caffeine intake, saturated fat and cholesterol, caloric balance, sufficient intake of fresh fruits, vegetables, and fiber.  -Stressed the importance of regular exercise.   -Substance Abuse: Discussed cessation/primary prevention of tobacco, alcohol, or other drug use; driving or other dangerous activities under the influence; availability of treatment for abuse.   -Injury prevention: Discussed safety belts, safety helmets, smoke detector, smoking near bedding or upholstery.   -Sexuality: Discussed sexually transmitted diseases, partner selection, use of condoms, avoidance of unintended pregnancy and contraceptive alternatives.   -Dental health: Discussed importance of regular tooth brushing, flossing, and dental visits.  -Health maintenance and immunizations reviewed. Please refer to Health maintenance section.  Return to care in 1 year for next preventative visit.     Subjective:  HPI:  He has No acute complaints today.   Lifestyle Diet: None specific.  Exercise: None specific.   Depression screen PHQ 2/9 01/22/2021  Decreased Interest 0  Down, Depressed, Hopeless 0  PHQ - 2 Score 0  Altered sleeping -  Tired, decreased energy -  Change in appetite -  Feeling bad or failure about yourself  -  Trouble concentrating -  Moving slowly or fidgety/restless -  Suicidal thoughts -  PHQ-9 Score -  Difficult doing work/chores -    Health Maintenance Due  Topic Date Due   COVID-19 Vaccine (1) Never done   FOOT EXAM  01/18/2020   HEMOGLOBIN A1C  01/21/2021   URINE MICROALBUMIN  01/21/2021     ROS: Per HPI, otherwise a  complete review of systems was negative.   PMH:  The following were reviewed and entered/updated in epic: Past Medical History:  Diagnosis Date   Cancer ([place]) 4 years ago   Prostate Cancer    Diabetes mellitus without complication ([place])    Hyperlipidemia    Sleep apnea    Smoker    Patient Active Problem List   Diagnosis  Date Noted   Peripheral neuropathy 01/22/2021   Actinic keratosis 07/23/2020   Joint pain 07/23/2019   Coronary artery calcification 04/19/2018   History of colonic polyps 01/14/2017   Diabetes mellitus without complication ([place]) [birth year]1962   Prostate cancer ([place]) 09/22/2010   Previous back surgery 09/22/2010   Carpal tunnel syndrome 09/22/2010   Nicotine dependence with current use 09/22/2010   Dyslipidemia associated with type 2 diabetes mellitus ([place]) 09/22/2010   Past Surgical History:  Procedure Laterality Date   BACK SURGERY  15 years ago   COLONOSCOPY  2007 (at age 37)   pt does not know MD name/normal exam per pt.   PROSTATECTOMY  4 years ago    Family History  Problem Relation Age of Onset   Heart disease Father    Alzheimer's disease Mother    Diabetes Other        family hx   Hypertension Sister    Heart disease Sister    Hypertension Sister    Stomach cancer Brother    Colon cancer Neg Hx     Medications- reviewed and updated Current Outpatient Medications  Medication Sig Dispense Refill   aspirin 81 MG EC tablet Take 81 mg by mouth daily.       atorvastatin (LIPITOR) 80 MG tablet Take one tablet 15m daily 90 tablet 3   Blood Glucose Monitoring Suppl (TRUE METRIX METER) w/Device KIT USE AS DIRECTED 1 kit 0   glucose blood (ONETOUCH VERIO) test strip 1 each by Other route daily as needed for other. Use as instructed 100 each 4   metFORMIN (GLUCOPHAGE) 500 MG tablet TAKE 1 TABLET BY MOUTH EVERY DAY WITH A MEAL 90 tablet 1   ONETOUCH DELICA LANCETS 322LMISC 1 each by Does not apply route daily as needed. 100 each 4   No current facility-administered medications for this visit.    Allergies-reviewed and updated No Known Allergies  Social History   Socioeconomic History   Marital status: Significant Other    Spouse name: Gregory Jensen  Number of children: 1   Years of education: 12   Highest education level: Not on file  Occupational History    Occupation: [employer]  Tobacco Use   Smoking status: Every Day    Packs/day: 1.25    Years: 47.00    Pack years: 58.75    Types: Cigarettes   Smokeless tobacco: Never  Vaping Use   Vaping Use: Never used  Substance and Sexual Activity   Alcohol use: No    Alcohol/week: 0.0 standard drinks   Drug use: No   Sexual activity: Not on file  Other Topics Concern   Not on file  Social History Narrative   Lives with wife. Has one son who lives in [place]    Works doing maintenance at [employer].     Social Determinants of Health   Financial Resource Strain: Not on file  Food Insecurity: Not on file  Transportation Needs: Not on file  Physical Activity: Not on file  Stress: Not on file  Social  Connections: Not on file        Objective:  Physical Exam: BP (!) 151/78   Pulse 73   Temp 98.5 F (36.9 C) (Temporal)   Ht _0  (1.727 m)   Wt 186 lb 12.8 oz (84.7 kg)   SpO2 98%   BMI 28.40 kg/m   Body mass index is 28.4 kg/m. Wt Readings from Last 3 Encounters:  01/22/21 186 lb 12.8 oz (84.7 kg)  07/23/20 188 lb (85.3 kg)  01/22/20 185 lb (83.9 kg)   Gen: NAD, resting comfortably HEENT: TMs normal bilaterally. OP clear. No thyromegaly noted.  CV: RRR with no murmurs appreciated Pulm: NWOB, CTAB with no crackles, wheezes, or rhonchi GI: Normal bowel sounds present. Soft, Nontender, Nondistended. MSK: no edema, cyanosis, or clubbing noted.  Decreased muscle mass noted in left lower extremity. Skin: warm, dry Neuro: CN2-12 grossly intact. Strength 5/5 in upper and lower extremities. Reflexes symmetric and intact bilaterally.  Psych: Normal affect and thought content     Candas Deemer M. [name], MD 01/22/2021 9:07 AM

## 2021-01-22 NOTE — Assessment & Plan Note (Signed)
check PSA.  Will be following up with urology in few weeks.

## 2021-01-22 NOTE — Patient Instructions (Signed)
It was very nice to see you today!  We will check blood work today.  Please let me know when you would like to have the MRI done on your mid and upper back.  I will see back in 6 months.  Please come back to see me sooner if needed.  Take care, Dr [name]  PLEASE NOTE:  If you had any lab tests please let us know if you have not heard back within a few days. You may see your results on mychart before we have a chance to review them but we will give you a call once they are reviewed by [place]. If we ordered any referrals today, please let us know if you have not heard from their office within the next week.   Please try these tips to maintain a healthy lifestyle:  Eat at least 3 REAL meals and 1-2 snacks per day.  Aim for no more than 5 hours between eating.  If you eat breakfast, please do so within one hour of getting up.   Each meal should contain half fruits/vegetables, one quarter protein, and one quarter carbs (no bigger than a computer mouse)  Cut down on sweet beverages. This includes juice, soda, and sweet tea.   Drink at least 1 glass of water with each meal and aim for at least 8 glasses per day  Exercise at least 150 minutes every week.    Preventive Care 50 Years and Older, Male Preventive care refers to lifestyle choices and visits with your health care provider that can promote health and wellness. This includes: A yearly physical exam. This is also called an annual wellness visit. Regular dental and eye exams. Immunizations. Screening for certain conditions. Healthy lifestyle choices, such as: Eating a healthy diet. Getting regular exercise. Not using drugs or products that contain nicotine and tobacco. Limiting alcohol use. What can I expect for my preventive care visit? Physical exam Your health care provider will check your: Height and weight. These may be used to calculate your BMI (body mass index). BMI is a measurement that tells if you are at a healthy  weight. Heart rate and blood pressure. Body temperature. Skin for abnormal spots. Counseling Your health care provider may ask you questions about your: Past medical problems. Family's medical history. Alcohol, tobacco, and drug use. Emotional well-being. Home life and relationship well-being. Sexual activity. Diet, exercise, and sleep habits. History of falls. Memory and ability to understand (cognition). Work and work Statistician. Access to firearms. What immunizations do I need? Vaccines are usually given at various ages, according to a schedule. Your health care provider will recommend vaccines for you based on your age, medical history, and lifestyle or other factors, such as travel or where you work.   What tests do I need? Blood tests Lipid and cholesterol levels. These may be checked every 5 years, or more often depending on your overall health. Hepatitis C test. Hepatitis B test. Screening Lung cancer screening. You may have this screening every year starting at age 28 if you have a 30-pack-year history of smoking and currently smoke or have quit within the past 15 years. Colorectal cancer screening. All adults should have this screening starting at age 55 and continuing until age 28. Your health care provider may recommend screening at age 59 if you are at increased risk. You will have tests every 1-10 years, depending on your results and the type of screening test. Prostate cancer screening. Recommendations will vary depending on your  family history and other risks. Genital exam to check for testicular cancer or hernias. Diabetes screening. This is done by checking your blood sugar (glucose) after you have not eaten for a while (fasting). You may have this done every 1-3 years. Abdominal aortic aneurysm (AAA) screening. You may need this if you are a current or former smoker. STD (sexually transmitted disease) testing, if you are at risk. Follow these instructions at  home: Eating and drinking Eat a diet that includes fresh fruits and vegetables, whole grains, lean protein, and low-fat dairy products. Limit your intake of foods with high amounts of sugar, saturated fats, and salt. Take vitamin and mineral supplements as recommended by your health care provider. Do not drink alcohol if your health care provider tells you not to drink. If you drink alcohol: Limit how much you have to 0-2 drinks a day. Be aware of how much alcohol is in your drink. In the [place] one drink equals one 12 oz bottle of beer (355 mL), one 5 oz glass of wine (148 mL), or one 1 oz glass of hard liquor (44 mL).   Lifestyle Take daily care of your teeth and gums. Brush your teeth every morning and night with fluoride toothpaste. Floss one time each day. Stay active. Exercise for at least 30 minutes 5 or more days each week. Do not use any products that contain nicotine or tobacco, such as cigarettes, e-cigarettes, and chewing tobacco. If you need help quitting, ask your health care provider. Do not use drugs. If you are sexually active, practice safe sex. Use a condom or other form of protection to prevent STIs (sexually transmitted infections). Talk with your health care provider about taking a low-dose aspirin or statin. Find healthy ways to cope with stress, such as: Meditation, yoga, or listening to music. Journaling. Talking to a trusted person. Spending time with friends and family. Safety Always wear your seat belt while driving or riding in a vehicle. Do not drive: If you have been drinking alcohol. Do not ride with someone who has been drinking. When you are tired or distracted. While texting. Wear a helmet and other protective equipment during sports activities. If you have firearms in your house, make sure you follow all gun safety procedures. What's next? Visit your health care provider once a year for an annual wellness visit. Ask your health care provider how often  you should have your eyes and teeth checked. Stay up to date on all vaccines. This information is not intended to replace advice given to you by your health care provider. Make sure you discuss any questions you have with your health care provider. Document Revised: 05/01/2019 Document Reviewed: 07/27/2018 Elsevier Patient Education  2021 [employer].

## 2021-01-22 NOTE — Assessment & Plan Note (Signed)
On metformin 500 mg daily.  Check labs today including A1c.  Follow-up 6 months.  Discussed lifestyle modifications.

## 2021-02-06 ENCOUNTER — Other Ambulatory Visit: Payer: Self-pay | Admitting: Acute Care

## 2021-02-06 DIAGNOSIS — F17210 Nicotine dependence, cigarettes, uncomplicated: Secondary | ICD-10-CM

## 2021-02-12 DIAGNOSIS — R9721 Rising PSA following treatment for malignant neoplasm of prostate: Secondary | ICD-10-CM | POA: Diagnosis not present

## 2021-02-25 ENCOUNTER — Other Ambulatory Visit: Payer: Self-pay | Admitting: Family Medicine

## 2021-02-26 ENCOUNTER — Ambulatory Visit
Admission: RE | Admit: 2021-02-26 | Discharge: 2021-02-26 | Disposition: A | Discharge status: Home / Self Care | Payer: Medicare PPO | Source: Ambulatory Visit | Attending: Acute Care | Admitting: Acute Care

## 2021-02-26 DIAGNOSIS — I700 Atherosclerosis of aorta: Secondary | ICD-10-CM | POA: Diagnosis not present

## 2021-02-26 DIAGNOSIS — F17210 Nicotine dependence, cigarettes, uncomplicated: Secondary | ICD-10-CM

## 2021-02-26 DIAGNOSIS — I2510 Atherosclerotic heart disease of native coronary artery without angina pectoris: Secondary | ICD-10-CM | POA: Diagnosis not present

## 2021-02-26 DIAGNOSIS — J432 Centrilobular emphysema: Secondary | ICD-10-CM | POA: Diagnosis not present

## 2021-02-28 ENCOUNTER — Other Ambulatory Visit: Payer: Self-pay | Admitting: Family Medicine

## 2021-03-05 DIAGNOSIS — C61 Malignant neoplasm of prostate: Secondary | ICD-10-CM | POA: Diagnosis not present

## 2021-03-09 ENCOUNTER — Encounter: Payer: Self-pay | Admitting: *Deleted

## 2021-03-09 ENCOUNTER — Telehealth: Payer: Self-pay | Admitting: Acute Care

## 2021-03-09 DIAGNOSIS — F17210 Nicotine dependence, cigarettes, uncomplicated: Secondary | ICD-10-CM

## 2021-03-09 NOTE — Telephone Encounter (Signed)
CT result letter sent to pt via Mychart. Copy of CT faxed to PCP. Order placed for 1 yr f/u low dose ct.

## 2021-03-14 ENCOUNTER — Encounter: Payer: Self-pay | Admitting: Gastroenterology

## 2021-03-17 NOTE — Progress Notes (Signed)
Please call patient and let them  know their  low dose Ct was read as a Lung RADS 2: nodules that are benign in appearance and behavior with a very low likelihood of becoming a clinically active cancer due to size or lack of growth. Recommendation per radiology is for a repeat LDCT in 12 months. .Please let them  know we will order and schedule their  annual screening scan for 02/2022. Please let them  know there was notation of CAD on their  scan.  Please remind the patient  that this is a non-gated exam therefore degree or severity of disease  cannot be determined. Please have them  follow up with their PCP regarding potential risk factor modification, dietary therapy or pharmacologic therapy if clinically indicated. Pt.  is  currently on statin therapy. Please place order for annual  screening scan for  02/2022 and fax results to PCP. Thanks so much.

## 2021-05-14 ENCOUNTER — Ambulatory Visit (INDEPENDENT_AMBULATORY_CARE_PROVIDER_SITE_OTHER): Payer: Medicare PPO | Admitting: Cardiology

## 2021-05-14 ENCOUNTER — Encounter: Payer: Self-pay | Admitting: Cardiology

## 2021-05-14 DIAGNOSIS — Z0000 Encounter for general adult medical examination without abnormal findings: Secondary | ICD-10-CM

## 2021-05-14 NOTE — Patient Instructions (Signed)
Health Maintenance, Male Adopting a healthy lifestyle and getting preventive care are important in promoting health and wellness. Ask your health care provider about: The right schedule for you to have regular tests and exams. Things you can do on your own to prevent diseases and keep yourself healthy. What should I know about diet, weight, and exercise? Eat a healthy diet  Eat a diet that includes plenty of vegetables, fruits, low-fat dairy products, and lean protein. Do not eat a lot of foods that are high in solid fats, added sugars, or sodium. Maintain a healthy weight Body mass index (BMI) is a measurement that can be used to identify possible weight problems. It estimates body fat based on height and weight. Your health care provider can help determine your BMI and help you achieve or maintain a healthy weight. Get regular exercise Get regular exercise. This is one of the most important things you can do for your health. Most adults should: Exercise for at least 150 minutes each week. The exercise should increase your heart rate and make you sweat (moderate-intensity exercise). Do strengthening exercises at least twice a week. This is in addition to the moderate-intensity exercise. Spend less time sitting. Even light physical activity can be beneficial. Watch cholesterol and blood lipids Have your blood tested for lipids and cholesterol at 66 years of age, then have this test every 5 years. You may need to have your cholesterol levels checked more often if: Your lipid or cholesterol levels are high. You are older than 66 years of age. You are at high risk for heart disease. What should I know about cancer screening? Many types of cancers can be detected early and may often be prevented. Depending on your health history and family history, you may need to have cancer screening at various ages. This may include screening for: Colorectal cancer. Prostate cancer. Skin cancer. Lung  cancer. What should I know about heart disease, diabetes, and high blood pressure? Blood pressure and heart disease High blood pressure causes heart disease and increases the risk of stroke. This is more likely to develop in people who have high blood pressure readings, are of African descent, or are overweight. Talk with your health care provider about your target blood pressure readings. Have your blood pressure checked: Every 3-5 years if you are 18-39 years of age. Every year if you are 40 years old or older. If you are between the ages of 65 and 75 and are a current or former smoker, ask your health care provider if you should have a one-time screening for abdominal aortic aneurysm (AAA). Diabetes Have regular diabetes screenings. This checks your fasting blood sugar level. Have the screening done: Once every three years after age 45 if you are at a normal weight and have a low risk for diabetes. More often and at a younger age if you are overweight or have a high risk for diabetes. What should I know about preventing infection? Hepatitis B If you have a higher risk for hepatitis B, you should be screened for this virus. Talk with your health care provider to find out if you are at risk for hepatitis B infection. Hepatitis C Blood testing is recommended for: Everyone born from 1945 through 1965. Anyone with known risk factors for hepatitis C. Sexually transmitted infections (STIs) You should be screened each year for STIs, including gonorrhea and chlamydia, if: You are sexually active and are younger than 66 years of age. You are older than 66 years   of age and your health care provider tells you that you are at risk for this type of infection. Your sexual activity has changed since you were last screened, and you are at increased risk for chlamydia or gonorrhea. Ask your health care provider if you are at risk. Ask your health care provider about whether you are at high risk for HIV.  Your health care provider may recommend a prescription medicine to help prevent HIV infection. If you choose to take medicine to prevent HIV, you should first get tested for HIV. You should then be tested every 3 months for as long as you are taking the medicine. Follow these instructions at home: Lifestyle Do not use any products that contain nicotine or tobacco, such as cigarettes, e-cigarettes, and chewing tobacco. If you need help quitting, ask your health care provider. Do not use street drugs. Do not share needles. Ask your health care provider for help if you need support or information about quitting drugs. Alcohol use Do not drink alcohol if your health care provider tells you not to drink. If you drink alcohol: Limit how much you have to 0-2 drinks a day. Be aware of how much alcohol is in your drink. In the [place] one drink equals one 12 oz bottle of beer (355 mL), one 5 oz glass of wine (148 mL), or one 1 oz glass of hard liquor (44 mL). General instructions Schedule regular health, dental, and eye exams. Stay current with your vaccines. Tell your health care provider if: You often feel depressed. You have ever been abused or do not feel safe at home. Summary Adopting a healthy lifestyle and getting preventive care are important in promoting health and wellness. Follow your health care provider's instructions about healthy diet, exercising, and getting tested or screened for diseases. Follow your health care provider's instructions on monitoring your cholesterol and blood pressure. This information is not intended to replace advice given to you by your health care provider. Make sure you discuss any questions you have with your health care provider. Document Revised: 10/10/2020 Document Reviewed: 07/26/2018 Elsevier Patient Education  2022 [employer]  

## 2021-05-14 NOTE — Progress Notes (Signed)
Subjective:   Gregory Jensen is a 65 y.o. male who presents for an Initial Medicare Annual Wellness Visit.  I connected with  Gregory Jensen on 05/14/21 by a video enabled telemedicine application and verified that I am speaking with the correct person using two identifiers.   I discussed the limitations of evaluation and management by telemedicine. The patient expressed understanding and agreed to proceed.   Location of Patient: Home Location of Provider: Office Persons participating in visit: Anes Purcell Nails and Rennis Harding, RN  Review of Systems    Defer to PCP Cardiac Risk Factors include: advanced age (>44mn, >>60women);diabetes mellitus;male gender     Objective:    Today's Vitals   05/14/21 1315  PainSc: 8    There is no height or weight on file to calculate BMI.  Advanced Directives 05/14/2021 03/01/2016 02/16/2016  Does Patient Have a Medical Advance Directive? No No No  Would patient like information on creating a medical advance directive? No - Patient declined - -    Current Medications (verified) Outpatient Encounter Medications as of 05/14/2021  Medication Sig   aspirin 81 MG EC tablet Take 81 mg by mouth daily.     atorvastatin (LIPITOR) 80 MG tablet Take one tablet 826mdaily   Blood Glucose Monitoring Suppl (TRUE METRIX METER) w/Device KIT USE AS DIRECTED   fenofibrate (TRICOR) 145 MG tablet Take 1 tablet (145 mg total) by mouth daily.   glucose blood (ONETOUCH VERIO) test strip 1 each by Other route daily as needed for other. Use as instructed   metFORMIN (GLUCOPHAGE) 500 MG tablet TAKE 1 TABLET BY MOUTH EVERY DAY WITH A MEAL   ONETOUCH DELICA LANCETS [id number] 1 each by Does not apply route daily as needed.   No facility-administered encounter medications on file as of 05/14/2021.    Allergies (verified) Patient has no known allergies.   History: Past Medical History:  Diagnosis Date   Cancer ([facility] years [facility]   Prostate Cancer     Diabetes mellitus without complication ([facility] [place]   Hyperlipidemia    Sleep apnea    Smoker    Past Surgical History:  Procedure Laterality Date   BACK SURGERY  15 years ago   COLONOSCOPY  2007 (at age 65  pt does not know MD name/normal exam per pt.   PROSTATECTOMY  4 years ago   Family History  Problem Relation Age of Onset   Alzheimer's disease Mother    Heart disease Father    Hypertension Sister    Heart disease Sister    Hypertension Sister    Stomach cancer Brother    Diabetes Other        family hx   Colon cancer Neg Hx    Social History   Socioeconomic History   Marital status: Significant Other    Spouse name: PaMardene Celeste Number of children: 1   Years of education: 12   Highest education level: Not on file  Occupational History   Occupation: [employer]    Comment: Retired  Tobacco Use   Smoking status: Every Day    Packs/day: 1.25    Years: 47.00    Pack years: 58.75    Types: Cigarettes   Smokeless tobacco: Never  Vaping Use   Vaping Use: Never used  Substance and Sexual Activity   Alcohol use: No    Alcohol/week: 0.0 standard drinks   Drug use: No   Sexual activity: Not on file  Other Topics Concern   Not on file  Social History Narrative   Lives with wife. Has one son who lives in [place].    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     Social Determinants of Health   Financial Resource Strain: Low Risk    Difficulty of Paying Living Expenses: Not hard at all  Food Insecurity: No Food Insecurity   Worried About Charity fundraiser in the Last Year: Never true   [place] in the Last Year: Never true  Transportation Needs: No Transportation Needs   Lack of Transportation (Medical): No   Lack of Transportation (Non-Medical): No  Physical Activity: Insufficiently Active   Days of Exercise per Week: 3 days   Minutes of Exercise per Session: 30 min  Stress: No Stress Concern Present   Feeling of  Stress : Not at all  Social Connections: Moderately Isolated   Frequency of Communication with Friends and Family: Twice a week   Frequency of Social Gatherings with Friends and Family: Once a week   Attends Religious Services: Never   Marine scientist or Organizations: No   Attends Music therapist: Never   Marital Status: Living with partner    Tobacco Counseling Ready to quit: Not Answered Counseling given: Not Answered   Clinical Intake:  Pre-visit preparation completed: Yes  Pain : 0-10 Pain Score: 8  Pain Type: Acute pain (going on for a month) Pain Location: Arm Pain Orientation: Right Pain Descriptors / Indicators: Aching Pain Onset: More than a month ago Pain Frequency: Occasional Pain Relieving Factors: patient states he is going to call him MD to get a visit  Pain Relieving Factors: patient states he is going to call him MD to get a visit    Diabetic?Yes  Interpreter Needed?: No  Information entered by :: Rennis Harding, RN   Activities of Daily Living In your present state of health, do you have any difficulty performing the following activities: 05/14/2021 07/23/2020  Hearing? N Y  Vision? N N  Difficulty concentrating or making decisions? N N  Walking or climbing stairs? Y Y  Comment patient has neuropathy -  Dressing or bathing? N N  Doing errands, shopping? N N  Preparing Food and eating ? N -  Using the Toilet? N -  In the past six months, have you accidently leaked urine? N -  Do you have problems with loss of bowel control? N -  Managing your Medications? N -  Housekeeping or managing your Housekeeping? N -  Some recent data might be hidden    Patient Care Team: Vivi Barrack, MD as PCP - General (Family Medicine)  Indicate any recent Medical Services you may have received from other than [facility] providers in the past year (date may be approximate).     Assessment:   This is a routine wellness examination for  Gregory Jensen.  Hearing/Vision screen No results found.  Dietary issues and exercise activities discussed: Current Exercise Habits: The patient does not participate in regular exercise at present, Exercise limited by: neurologic condition(s) (neuropathy)   Goals Addressed   None   Depression Screen PHQ 2/9 Scores 05/14/2021 05/14/2021 01/22/2021 07/23/2020 07/23/2019 01/18/2019 09/21/2018  PHQ - 2 Score 1 1 0 0 0 0 0  PHQ- 9 Score - - - - 1 1 -    Fall Risk Fall Risk  05/14/2021 01/22/2021 10/12/2019 10/25/2018  Falls in the  past year? 0 0 0 0  Number falls in past yr: 0 0 0 0  Injury with Fall? 0 0 0 0  Risk for fall due to : No Fall Risks - - -  Follow up - - - Falls evaluation completed    FALL RISK PREVENTION PERTAINING TO THE HOME:  Any stairs in or around the home? Yes  If so, are there any without handrails? Yes  Home free of loose throw rugs in walkways, pet beds, electrical cords, etc? NO, has rugs Adequate lighting in your home to reduce risk of falls? Yes   ASSISTIVE DEVICES UTILIZED TO PREVENT FALLS:  Life alert? No  Use of a cane, walker or w/c? No  Grab bars in the bathroom? No  Shower chair or bench in shower? Yes  Elevated toilet seat or a handicapped toilet? No   TIMED UP AND GO:  Was the test performed? No .  Length of time to ambulate 10 feet: N/A sec.     Cognitive Function:     6CIT Screen 05/14/2021  What Year? 0 points  What month? 0 points  What time? 0 points  Count back from 20 0 points  Months in reverse 0 points  Repeat phrase 0 points  Total Score 0    Immunizations Immunization History  Administered Date(s) Administered   PFIZER Comirnaty(Gray Top)Covid-19 Tri-Sucrose Vaccine 09/12/2020   PFIZER(Purple Top)SARS-COV-2 Vaccination 03/20/2020, 04/10/2020   Tdap 12/19/2013    TDAP status: Up to date  Flu Vaccine status: Declined, Education has been provided regarding the importance of this vaccine but patient still declined. Advised may  receive this vaccine at local pharmacy or Health Dept. Aware to provide a copy of the vaccination record if obtained from local pharmacy or Health Dept. Verbalized acceptance and understanding.  Pneumococcal vaccine status: Declined,  Education has been provided regarding the importance of this vaccine but patient still declined. Advised may receive this vaccine at local pharmacy or Health Dept. Aware to provide a copy of the vaccination record if obtained from local pharmacy or Health Dept. Verbalized acceptance and understanding.   Covid-19 vaccine status: Completed vaccines  Qualifies for Shingles Vaccine? Yes  Zostavax completed No   Shingrix Completed?: No.    Education has been provided regarding the importance of this vaccine. Patient has been advised to call insurance company to determine out of pocket expense if they have not yet received this vaccine. Advised may also receive vaccine at local pharmacy or Health Dept. Verbalized acceptance and understanding.  Screening Tests Health Maintenance  Topic Date Due   Zoster Vaccines- Shingrix (1 of 2) Never done   FOOT EXAM  01/18/2020   OPHTHALMOLOGY EXAM  10/14/2020   COVID-19 Vaccine (4 - Booster for Pfizer series) 12/05/2020   COLONOSCOPY (Pts 45-63yr Insurance coverage will need to be confirmed)  03/01/2021   INFLUENZA VACCINE  03/16/2021   HEMOGLOBIN A1C  07/24/2021   URINE MICROALBUMIN  01/22/2022   TETANUS/TDAP  12/20/2023   Hepatitis C Screening  Completed   HPV VACCINES  Aged Out    Health Maintenance  Health Maintenance Due  Topic Date Due   Zoster Vaccines- Shingrix (1 of 2) Never done   FOOT EXAM  01/18/2020   OPHTHALMOLOGY EXAM  10/14/2020   COVID-19 Vaccine (4 - Booster for [place]) 12/05/2020   COLONOSCOPY (Pts 45-429yrInsurance coverage will need to be confirmed)  03/01/2021   INFLUENZA VACCINE  03/16/2021    Colorectal cancer screening: Type of  screening: Colonoscopy. Completed 2017. Repeat every 5  years  Lung Cancer Screening: (Low Dose CT Chest recommended if Age 18-80 years, 30 pack-year currently smoking OR have quit w/in 15years.) does qualify. Done 02/27/21  Lung Cancer Screening Referral: N/A  Additional Screening:  Hepatitis C Screening: does qualify; Completed 2019  Vision Screening: Recommended annual ophthalmology exams for early detection of glaucoma and other disorders of the eye. Is the patient up to date with their annual eye exam?  Yes  Who is the provider or what is the name of the office in which the patient attends annual eye exams? Dr Governor Specking If pt is not established with a provider, would they like to be referred to a provider to establish care? No .   Dental Screening: Recommended annual dental exams for proper oral hygiene  Community Resource Referral / Chronic Care Management: CRR required this visit?  No   CCM required this visit?  No      Plan:     I have personally reviewed and noted the following in the patient's chart:   Medical and social history Use of alcohol, tobacco or illicit drugs  Current medications and supplements including opioid prescriptions. Patient is not currently taking opioid prescriptions. Functional ability and status Nutritional status Physical activity Advanced directives List of other physicians Hospitalizations, surgeries, and ER visits in previous 12 months Vitals Screenings to include cognitive, depression, and falls Referrals and appointments  In addition, I have reviewed and discussed with patient certain preventive protocols, quality metrics, and best practice recommendations. A written personalized care plan for preventive services as well as general preventive health recommendations were provided to patient.    Patient will get eye exam before end of year, also will check about 4th vaccine.  Not interested in getting flu vaccine or shingles vaccine.   Rennis Harding, RN   05/14/2021   Nurse Notes:    Non-Face to Face 40 minute visit   Mr. Purcell Nails , Thank you for taking time to come for your Medicare Wellness Visit. I appreciate your ongoing commitment to your health goals. Please review the following plan we discussed and let me know if I can assist you in the future.   These are the goals we discussed:  Goals   None     This is a list of the screening recommended for you and due dates:  Health Maintenance  Topic Date Due   Zoster (Shingles) Vaccine (1 of 2) Never done   Complete foot exam   01/18/2020   Eye exam for diabetics  10/14/2020   COVID-19 Vaccine (4 - Booster for Pfizer series) 12/05/2020   Colon Cancer Screening  03/01/2021   Flu Shot  03/16/2021   Hemoglobin A1C  07/24/2021   Urine Protein Check  01/22/2022   Tetanus Vaccine  12/20/2023   Hepatitis C Screening: USPSTF Recommendation to screen - Ages 18-79 yo.  Completed   HPV Vaccine  Aged Out

## 2021-06-13 ENCOUNTER — Other Ambulatory Visit: Payer: Self-pay | Admitting: Family Medicine

## 2021-07-28 ENCOUNTER — Encounter: Payer: Self-pay | Admitting: Family Medicine

## 2021-07-28 ENCOUNTER — Other Ambulatory Visit: Payer: Self-pay

## 2021-07-28 ENCOUNTER — Ambulatory Visit: Payer: Medicare PPO | Admitting: Family Medicine

## 2021-07-28 VITALS — BP 142/76 | HR 66 | Temp 98.1°F | Ht 68.0 in | Wt 192.4 lb

## 2021-07-28 DIAGNOSIS — G8929 Other chronic pain: Secondary | ICD-10-CM

## 2021-07-28 DIAGNOSIS — C61 Malignant neoplasm of prostate: Secondary | ICD-10-CM | POA: Diagnosis not present

## 2021-07-28 DIAGNOSIS — E785 Hyperlipidemia, unspecified: Secondary | ICD-10-CM

## 2021-07-28 DIAGNOSIS — E1169 Type 2 diabetes mellitus with other specified complication: Secondary | ICD-10-CM | POA: Diagnosis not present

## 2021-07-28 DIAGNOSIS — G6289 Other specified polyneuropathies: Secondary | ICD-10-CM

## 2021-07-28 DIAGNOSIS — M25511 Pain in right shoulder: Secondary | ICD-10-CM | POA: Diagnosis not present

## 2021-07-28 DIAGNOSIS — E119 Type 2 diabetes mellitus without complications: Secondary | ICD-10-CM

## 2021-07-28 LAB — CBC
HCT: 43.1 % (ref 39.0–52.0)
Hemoglobin: 14.4 g/dL (ref 13.0–17.0)
MCHC: 33.3 g/dL (ref 30.0–36.0)
MCV: 95 fl (ref 78.0–100.0)
Platelets: 165 10*3/uL (ref 150.0–400.0)
RBC: 4.54 Mil/uL (ref 4.22–5.81)
RDW: 13.3 % (ref 11.5–15.5)
WBC: 7.2 10*3/uL (ref 4.0–10.5)

## 2021-07-28 LAB — LIPID PANEL
Cholesterol: 122 mg/dL (ref 0–200)
HDL: 23 mg/dL — ABNORMAL LOW (ref >39.00)
NonHDL: 98.77
Total CHOL/HDL Ratio: 5
Triglycerides: 380 mg/dL — ABNORMAL HIGH (ref 0.0–149.0)
VLDL: 76 mg/dL — ABNORMAL HIGH (ref 0.0–40.0)

## 2021-07-28 LAB — POCT GLYCOSYLATED HEMOGLOBIN (HGB A1C): Hemoglobin A1C: 7.7 % — AB (ref 4.0–5.6)

## 2021-07-28 LAB — LDL CHOLESTEROL, DIRECT: Direct LDL: 46 mg/dL

## 2021-07-28 LAB — PSA: PSA: 0 ng/mL — ABNORMAL LOW (ref 0.10–4.00)

## 2021-07-28 LAB — COMPREHENSIVE METABOLIC PANEL
ALT: 24 U/L (ref 0–53)
AST: 21 U/L (ref 0–37)
Albumin: 4.7 g/dL (ref 3.5–5.2)
Alkaline Phosphatase: 61 U/L (ref 39–117)
BUN: 15 mg/dL (ref 6–23)
CO2: 26 mEq/L (ref 19–32)
Calcium: 9.9 mg/dL (ref 8.4–10.5)
Chloride: 106 mEq/L (ref 96–112)
Creatinine, Ser: 1.03 mg/dL (ref 0.40–1.50)
GFR: 75.76 mL/min (ref >60.00)
Glucose, Bld: 157 mg/dL — ABNORMAL HIGH (ref 70–99)
Potassium: 4.8 mEq/L (ref 3.5–5.1)
Sodium: 143 mEq/L (ref 135–145)
Total Bilirubin: 0.7 mg/dL (ref 0.2–1.2)
Total Protein: 7.4 g/dL (ref 6.0–8.3)

## 2021-07-28 LAB — TSH: TSH: 4.46 u[IU]/mL (ref 0.35–5.50)

## 2021-07-28 NOTE — Assessment & Plan Note (Signed)
A1c improving at 7.7.  He would like to continue with current medication regimen for now.  We will recheck again in 3 to 6 months.  Discussed lifestyle modifications.

## 2021-07-28 NOTE — Assessment & Plan Note (Signed)
Is having some muscle aches with fenofibrate.  We will stop.  He has had extremely elevated triglycerides to the 1400s.  Will place referral to lipid clinic for further management.  We will continue his atorvastatin 80 mg daily.

## 2021-07-28 NOTE — Progress Notes (Signed)
Gregory Jensen is a 65 y.o. male who presents today for an office visit.  Assessment/Plan:  New/Acute Problems: Shoulder Jensen Consistent with rotator cuff strain.  Discussed home exercises and handout was given.  He declined medications for now.  Recommended ice pack frequently.  He also declined referral to physical therapy.  Discussed reasons to return to care.  Low back Jensen No red flags.  Likely muscular strain.  Discussed home exercises and handout was given.  He declined muscle relaxer.  Recommended heating pad.  He will let me know if not improving and we can refer her to the PT or sports medicine.  Elevated blood pressure reading Above goal initially though typically well controlled.  He will continue monitoring at home and let us know if it is persistently 150/90 or higher.  Chronic Problems Addressed Today: Diabetes mellitus without complication (HCC) C7E improving at 7.7.  He would like to continue with current medication regimen for now.  We will recheck again in 3 to 6 months.  Discussed lifestyle modifications.  Peripheral neuropathy Overall stable.  HE will follow-up with neurology if symptoms worsen.  Dyslipidemia associated with type 2 diabetes mellitus ([place]) Is having some muscle aches with fenofibrate.  We will stop.  He has had extremely elevated triglycerides to the 1400s.  Will place referral to lipid clinic for further management.  We will continue his atorvastatin 80 mg daily.  Prostate cancer Check PSA.     Subjective:  HPI:  He is here to follow up on diabetes mellitus. Last saw him in the office 6 months ago. He is currently on Metformin 500 mg daily. His A1c is stable at office today. He is tolerating his medication without any side effects.   He complain of right arm Jensen. Started about several months ago Located in the upper arm and shoulder. Worse with certain motions. He admit having Jensen when he raise his arm. Comes and goes. He had not tried  any medication for this issue. He would like to try exercises. Denies fever, chills or nausea. Denies Jensen in the neck.   He also complain of back Jensen.This started about a month ago.  Located on the both sides. Gregory Jensen comes and goes. He has not tried any medication for this issue. Also had a lot of leg cramping when he is sitting.  Additionally, his blood pressure at office was elevated. He is concerned about this issue. He notes he has not been checking his blood pressure at home.         Objective:  Physical Exam: BP (!) 142/76 (BP Location: Right Arm)   Pulse 66   Temp 98.1 F (36.7 C) (Temporal)   Ht 5\' 8"  (1.727 m)   Wt 192 lb 6.4 oz (87.3 kg)   SpO2 98%   BMI 29.25 kg/m   Gen: No acute distress, resting comfortably CV: Regular rate and rhythm with no murmurs appreciated Pulm: Normal work of breathing, clear to auscultation bilaterally with no crackles, wheezes, or rhonchi MSK: Right shoulder without raise.  Positive Neer and Hawkins test.  Jensen elicited with resisted supraspinatus testing.  No Jensen with internal or external rotation.  Neurovascular intact distally.  Negative speeds test. - Back: No deformities.  Tenderness palpation along upper lumbar paraspinal muscles on the right side. Neuro: Grossly normal, moves all extremities Psych: Normal affect and thought content       I,Gregory Jensen,acting as a scribe for Gregory Chyle, MD.,have documented all relevant documentation on  the behalf of Gregory Chyle, MD,as directed by  Gregory Chyle, MD while in the presence of Gregory Chyle, MD.   I, Gregory Chyle, MD, have reviewed all documentation for this visit. The documentation on 07/28/21 for the exam, diagnosis, procedures, and orders are all accurate and complete.  Gregory Jensen. Gregory Pain, MD 07/28/2021 9:53 AM

## 2021-07-28 NOTE — Patient Instructions (Signed)
It was very nice to see you today!  Your A1c is slightly above goal though improving.  We will continue current dose of metformin.  Keep an eye on your blood pressure and let us know if it is persistently 150/90 or higher.  Please stop the fenofibrate.  We will check your blood work today.  We will also refer you to see the cholesterol specialist due to your very high triglycerides.  Please work on the exercises for your shoulder and your back.  Let me know if not improving  We will see you back in 6 months.  Please come back to see [place] sooner if needed.  Take care, Dr [name]  PLEASE NOTE:  If you had any lab tests please let us know if you have not heard back within a few days. You may see your results on mychart before we have a chance to review them but we will give you a call once they are reviewed by [place]. If we ordered any referrals today, please let us know if you have not heard from their office within the next week.   Please try these tips to maintain a healthy lifestyle:  Eat at least 3 REAL meals and 1-2 snacks per day.  Aim for no more than 5 hours between eating.  If you eat breakfast, please do so within one hour of getting up.   Each meal should contain half fruits/vegetables, one quarter protein, and one quarter carbs (no bigger than a computer mouse)  Cut down on sweet beverages. This includes juice, soda, and sweet tea.   Drink at least 1 glass of water with each meal and aim for at least 8 glasses per day  Exercise at least 150 minutes every week.

## 2021-07-28 NOTE — Assessment & Plan Note (Signed)
Check PSA. ?

## 2021-07-28 NOTE — Assessment & Plan Note (Signed)
Overall stable.  HE will follow-up with neurology if symptoms worsen.

## 2021-07-29 ENCOUNTER — Telehealth: Payer: Self-pay

## 2021-07-29 NOTE — Telephone Encounter (Signed)
Patient called in stating he had a missed  call.  I was able to reach out to [facility].  Rosine Door stated she had tried to reach patient.  Stated she needed the name of the last facility patient had eye exam.  Patient states he see Dr. Asencion Islam at [facility] in [place].

## 2021-07-29 NOTE — Progress Notes (Signed)
Please inform patient of the following:  His labs are all STABLE. Do not need to make any changes to his treatment plan at this time. We can recheck in 6 months.  Gregory Jensen. [name], MD 07/29/2021 8:52 AM

## 2021-07-29 NOTE — Progress Notes (Signed)
Ok to place referral.  Algis Greenhouse. [name], MD 07/29/2021 11:53 AM

## 2021-07-30 NOTE — Telephone Encounter (Signed)
Spoke with pt and got all information needed, contacting office to get medical records for abstraction.

## 2021-12-07 ENCOUNTER — Other Ambulatory Visit: Payer: Self-pay | Admitting: *Deleted

## 2021-12-07 ENCOUNTER — Telehealth: Payer: Self-pay

## 2021-12-07 DIAGNOSIS — E1169 Type 2 diabetes mellitus with other specified complication: Secondary | ICD-10-CM

## 2021-12-07 MED ORDER — METFORMIN HCL 500 MG PO TABS: ORAL_TABLET | ORAL | 1 refills | Status: DC

## 2021-12-07 MED ORDER — ATORVASTATIN CALCIUM 80 MG PO TABS: ORAL_TABLET | ORAL | 1 refills | Status: DC

## 2021-12-07 NOTE — Telephone Encounter (Signed)
Rx send to [facility]  ?

## 2021-12-07 NOTE — Telephone Encounter (Signed)
..   Encourage patient to contact the pharmacy for refills or they can request refills through [place]:  07/28/2022  NEXT APPOINTMENT DATE: na  MEDICATION:  metformin and atorvastatin  Is the patient out of medication?   [facility] [place]  (also wants Pharmacy list to be updated to [facility] [place] [facility] - all else to be removed)  Let patient know to contact pharmacy at the end of the day to make sure medication is ready.  Please notify patient to allow 48-72 hours to process

## 2021-12-23 ENCOUNTER — Encounter (HOSPITAL_BASED_OUTPATIENT_CLINIC_OR_DEPARTMENT_OTHER): Payer: Self-pay | Admitting: Internal Medicine

## 2021-12-23 ENCOUNTER — Ambulatory Visit (HOSPITAL_BASED_OUTPATIENT_CLINIC_OR_DEPARTMENT_OTHER): Payer: HMO | Admitting: Internal Medicine

## 2021-12-23 VITALS — BP 138/59 | HR 77 | Ht 68.0 in | Wt 187.9 lb

## 2021-12-23 DIAGNOSIS — R030 Elevated blood-pressure reading, without diagnosis of hypertension: Secondary | ICD-10-CM

## 2021-12-23 DIAGNOSIS — E781 Pure hyperglyceridemia: Secondary | ICD-10-CM

## 2021-12-23 DIAGNOSIS — E785 Hyperlipidemia, unspecified: Secondary | ICD-10-CM

## 2021-12-23 DIAGNOSIS — I2510 Atherosclerotic heart disease of native coronary artery without angina pectoris: Secondary | ICD-10-CM | POA: Diagnosis not present

## 2021-12-23 DIAGNOSIS — E119 Type 2 diabetes mellitus without complications: Secondary | ICD-10-CM

## 2021-12-23 DIAGNOSIS — Z720 Tobacco use: Secondary | ICD-10-CM

## 2021-12-23 MED ORDER — ATORVASTATIN CALCIUM 40 MG PO TABS: 40.0000 mg | ORAL_TABLET | Freq: Every day | ORAL | 3 refills | Status: DC

## 2021-12-23 MED ORDER — FENOFIBRATE 145 MG PO TABS: 145.0000 mg | ORAL_TABLET | Freq: Every day | ORAL | 3 refills | Status: DC

## 2021-12-23 NOTE — Patient Instructions (Signed)
Medication Instructions:  ?DECREASE atorvastatin to 40mg  daily ?START fenofibrate 145mg  daily  ? ?*If you need a refill on your cardiac medications before your next appointment, please call your pharmacy* ? ? ?Lab Work: ?FASTING lab work to check cholesterol in 3-4 months ?-- complete about 1 week before your next visit ? ?If you have labs (blood work) drawn today and your tests are completely normal, you will receive your results only by: ?MyChart Message (if you have MyChart) OR ?A paper copy in the mail ?If you have any lab test that is abnormal or we need to change your treatment, we will call you to review the results. ? ? ?Testing/Procedures: ?NONE ? ? ?Follow-Up: ?At [facility], you and your health needs are our priority.  As part of our continuing mission to provide you with exceptional heart care, we have created designated Provider Care Teams.  These Care Teams include your primary Cardiologist (physician) and Advanced Practice Providers (APPs -  Physician Assistants and Nurse Practitioners) who all work together to provide you with the care you need, when you need it. ? ?We recommend signing up for the patient portal called "MyChart".  Sign up information is provided on this After Visit Summary.  MyChart is used to connect with patients for Virtual Visits (Telemedicine).  Patients are able to view lab/test results, encounter notes, upcoming appointments, etc.  Non-urgent messages can be sent to your provider as well.   ?To learn more about what you can do with MyChart, go to [email or web address].   ? ?Your next appointment:   ? ?3-4 months with Dr. Debara Pickett -- lipid clinic ?

## 2021-12-23 NOTE — Progress Notes (Signed)
? ? ?LIPID CLINIC CONSULT NOTE ? ?Chief Complaint:  ?Manage dyslipidemia ? ?Primary Care Physician: ?Vivi Barrack, MD ? ?Primary Cardiologist:  ?None ? ?HPI:  ?Gregory Jensen is a 66 y.o. male who is being seen today for the evaluation of dyslipidemia at the request of Vivi Barrack, MD. this is a pleasant 66 year old male kindly referred for evaluation management of dyslipidemia.  He has a history of type 2 diabetes with neuropathy, dyslipidemia, sleep apnea and ongoing tobacco use more than 50 pack years.  He also has a history of prostate cancer.  He had a recent high-resolution CT scan for screening of lung cancer that showed two-vessel coronary calcification and aortic atherosclerosis.  He has had no clinical cardiac events.  He does have a history of heart disease in his sister and his father.  He has also had longstanding high cholesterol, particular high triglycerides he reports since he was a teenager.  In the past his triglycerides have been in the 14-15 100s but he says has also been in the thousands.  He has had some benefit from fenofibrate and most recently in December 2022 his total cholesterol was 122, HDL 23, triglycerides 380 had a direct LDL of 46.  Hemoglobin A1c at that time was 7.7%.  He has been on longstanding atorvastatin more recently on the 80 mg dose but then was also on fenofibrate 160 mg daily.  He thought this combination actually caused him worsening muscle aches.  Subsequently he does continue the fenofibrate and noticed improvement in his symptoms.  He has not had lipids retested since then.  He is also somewhat concerned about his blood pressure which was mildly elevated today 138/59.  He says he monitors it infrequently at home but does have a blood pressure cuff.  He has not been on any medications and does not have a diagnosis of hypertension however is a diabetic would be recommended to be on an ACE inhibitor or ARB for kidney prevention.  Based upon the HOPE trial  data. ? ?PMHx:  ?Past Medical History:  ?Diagnosis Date  ? Cancer ([place]) 4 years ago  ? Prostate Cancer   ? Diabetes mellitus without complication ([place])   ? Hyperlipidemia   ? Sleep apnea   ? Smoker   ? ? ?Past Surgical History:  ?Procedure Laterality Date  ? BACK SURGERY  15 years ago  ? COLONOSCOPY  2007 (at age 71)  ? pt does not know MD name/normal exam per pt.  ? PROSTATECTOMY  4 years ago  ? ? ?FAMHx:  ?Family History  ?Problem Relation Age of Onset  ? Alzheimer's disease Mother   ? Heart disease Father   ? Hypertension Sister   ? Heart disease Sister   ? Hypertension Sister   ? Stomach cancer Brother   ? Diabetes Other   ?     family hx  ? Colon cancer Neg Hx   ? ? ?SOCHx:  ? reports that he has been smoking cigarettes. He has a 58.75 pack-year smoking history. He has never used smokeless tobacco. He reports that he does not drink alcohol and does not use drugs. ? ?ALLERGIES:  ?No Known Allergies ? ?ROS: ?Pertinent items noted in HPI and remainder of comprehensive ROS otherwise negative. ? ?HOME MEDS: ?Current Outpatient Medications on File Prior to Visit  ?Medication Sig Dispense Refill  ? aspirin 81 MG EC tablet Take 81 mg by mouth daily.      ? atorvastatin (LIPITOR) 80 MG  tablet Take one tablet 27m daily 90 tablet 1  ? Blood Glucose Monitoring Suppl (TRUE METRIX METER) w/Device KIT USE AS DIRECTED 1 kit 0  ? glucose blood (ONETOUCH VERIO) test strip 1 each by Other route daily as needed for other. Use as instructed 100 each 4  ? metFORMIN (GLUCOPHAGE) 500 MG tablet Take 1 tablet by mouth every day with a meal 90 tablet 1  ? ONETOUCH DELICA LANCETS 319QMISC 1 each by Does not apply route daily as needed. 100 each 4  ? ?No current facility-administered medications on file prior to visit.  ? ? ?LABS/IMAGING: ?No results found for this or any previous visit (from the past 48 hour(s)). ?No results found. ? ?LIPID PANEL: ?   ?Component Value Date/Time  ? CHOL 122 07/28/2021 0933  ? TRIG 380.0 (H) 07/28/2021  0933  ? HDL 23.00 (L) 07/28/2021 0933  ? CHOLHDL 5 07/28/2021 0933  ? VLDL 76.0 (H) 07/28/2021 0933  ? LTorreon 07/23/2020 0827  ?   Comment:  ?   . ?LDL cholesterol not calculated. Triglyceride levels ?greater than 400 mg/dL invalidate calculated LDL results. ?. ?Reference range: <100 ?.Marland Kitchen?Desirable range <100 mg/dL for primary prevention;   ?<70 mg/dL for patients with CHD or diabetic patients  ?with > or = 2 CHD risk factors. ?. ?LDL-C is now calculated using the Martin-Hopkins  ?calculation, which is a validated novel method providing  ?better accuracy than the Friedewald equation in the  ?estimation of LDL-C.  ?MCresenciano Genreet al. JAnnamaria Helling [id number];[phone](92: [id number]  ?(http://education.[employer].com/faq/FAQ164) ?  ? LDLDIRECT 46.0 07/28/2021 0933  ? ? ?WEIGHTS: ?Wt Readings from Last 3 Encounters:  ?12/23/21 187 lb 14.4 oz (85.2 kg)  ?07/28/21 192 lb 6.4 oz (87.3 kg)  ?01/22/21 186 lb 12.8 oz (84.7 kg)  ? ? ?VITALS: ?BP (!) 138/59   Pulse 77   Ht '5\' 8"'  (1.727 m)   Wt 187 lb 14.4 oz (85.2 kg)   SpO2 97%   BMI 28.57 kg/m?  ? ?EXAM: ?General appearance: alert and no distress ?Neck: no carotid bruit, no JVD, and thyroid not enlarged, symmetric, no tenderness/mass/nodules ?Lungs: clear to auscultation bilaterally ?Heart: regular rate and rhythm, S1, S2 normal, no murmur, click, rub or gallop ?Abdomen: soft, non-tender; bowel sounds normal; no masses,  no organomegaly ?Extremities: extremities normal, atraumatic, no cyanosis or edema ?Pulses: 2+ and symmetric ?Skin: Skin color, texture, turgor normal. No rashes or lesions ?Neurologic: Grossly normal ?Psych: Pleasant ? ?EKG: ?N/A ? 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 ?PLAN: ?1.   Gregory Jensen a mixed  dyslipidemia with target LDL less than 70.  Direct LDL has been lower than this however he has not tolerated high intensity atorvastatin in combination with fenofibrate.  I suspect this is due to the combination of medications and the higher doses prescribed.  I would advise decreasing his atorvastatin from 80 to 40 mg daily and will decrease the fenofibrate from 160 to 145 mg daily.  Hopefully this will be better tolerated but still should adequately treat his lipids.  We will plan a repeat lipid profile in about 3 to 4 months as well as a direct LDL.  He should not have a checked earlier that by his PCP since the medications would not have had time to  give maximal benefit.  He needs continued good diabetes control target A1c less than 7.0%.  We discussed smoking cessation today.  He has made many attempts in the past but is not sure how to go about that.  I reminded him that he does have two-vessel coronary calcification which is atherosclerotic cardiovascular disease as well as aortic atherosclerosis on imaging and therefore aggressive lipid management is important.  Finally, we addressed his issues and concerns about hypertension.  He will monitor his blood pressures at home and get back to me on that.  As a diabetic he should be on an ACE inhibitor or ARB for renal protection and if his blood pressure is even mildly elevated would benefit from a low-dose ARB. ? ?Thanks again for the kind referral. ? ?Pixie Casino, MD, [facility], FACP  ?[place]  ?Medical Director of the Advanced Lipid Disorders &  ?Cardiovascular Risk Reduction Clinic ?Diplomate of the [employer] of Clinical Lipidology ?Attending Cardiologist  ?Direct Dial: [phone]  Fax: [phone]  ?Website:  [email or web address] ? ?Nadean Corwin Shaynah Hund ?12/23/2021, 10:02 AM  ?

## 2022-01-26 ENCOUNTER — Ambulatory Visit: Payer: Medicare PPO | Admitting: Family Medicine

## 2022-02-08 ENCOUNTER — Ambulatory Visit: Payer: Medicare PPO | Admitting: Family Medicine

## 2022-02-17 ENCOUNTER — Encounter: Payer: Self-pay | Admitting: Family Medicine

## 2022-02-17 ENCOUNTER — Ambulatory Visit (INDEPENDENT_AMBULATORY_CARE_PROVIDER_SITE_OTHER): Payer: HMO | Admitting: Family Medicine

## 2022-02-17 VITALS — BP 118/66 | HR 73 | Temp 98.5°F | Ht 68.0 in | Wt 187.0 lb

## 2022-02-17 DIAGNOSIS — E785 Hyperlipidemia, unspecified: Secondary | ICD-10-CM

## 2022-02-17 DIAGNOSIS — C61 Malignant neoplasm of prostate: Secondary | ICD-10-CM | POA: Diagnosis not present

## 2022-02-17 DIAGNOSIS — E1169 Type 2 diabetes mellitus with other specified complication: Secondary | ICD-10-CM | POA: Diagnosis not present

## 2022-02-17 DIAGNOSIS — Z1211 Encounter for screening for malignant neoplasm of colon: Secondary | ICD-10-CM

## 2022-02-17 DIAGNOSIS — E119 Type 2 diabetes mellitus without complications: Secondary | ICD-10-CM

## 2022-02-17 DIAGNOSIS — F17200 Nicotine dependence, unspecified, uncomplicated: Secondary | ICD-10-CM

## 2022-02-17 DIAGNOSIS — G6289 Other specified polyneuropathies: Secondary | ICD-10-CM | POA: Diagnosis not present

## 2022-02-17 LAB — CBC
HCT: 43.2 % (ref 39.0–52.0)
Hemoglobin: 14.8 g/dL (ref 13.0–17.0)
MCHC: 34.2 g/dL (ref 30.0–36.0)
MCV: 93 fl (ref 78.0–100.0)
Platelets: 164 10*3/uL (ref 150.0–400.0)
RBC: 4.65 Mil/uL (ref 4.22–5.81)
RDW: 13.4 % (ref 11.5–15.5)
WBC: 8.3 10*3/uL (ref 4.0–10.5)

## 2022-02-17 LAB — COMPREHENSIVE METABOLIC PANEL
ALT: 20 U/L (ref 0–53)
AST: 18 U/L (ref 0–37)
Albumin: 4.8 g/dL (ref 3.5–5.2)
Alkaline Phosphatase: 61 U/L (ref 39–117)
BUN: 17 mg/dL (ref 6–23)
CO2: 24 mEq/L (ref 19–32)
Calcium: 9.8 mg/dL (ref 8.4–10.5)
Chloride: 106 mEq/L (ref 96–112)
Creatinine, Ser: 1.17 mg/dL (ref 0.40–1.50)
GFR: 64.76 mL/min (ref >60.00)
Glucose, Bld: 183 mg/dL — ABNORMAL HIGH (ref 70–99)
Potassium: 5.1 mEq/L (ref 3.5–5.1)
Sodium: 142 mEq/L (ref 135–145)
Total Bilirubin: 0.7 mg/dL (ref 0.2–1.2)
Total Protein: 7.2 g/dL (ref 6.0–8.3)

## 2022-02-17 LAB — PSA: PSA: 0 ng/mL — ABNORMAL LOW (ref 0.10–4.00)

## 2022-02-17 LAB — POCT GLYCOSYLATED HEMOGLOBIN (HGB A1C): Hemoglobin A1C: 8.5 % — AB (ref 4.0–5.6)

## 2022-02-17 LAB — TSH: TSH: 5.37 u[IU]/mL (ref 0.35–5.50)

## 2022-02-17 MED ORDER — METFORMIN HCL 500 MG PO TABS: 500.0000 mg | ORAL_TABLET | Freq: Two times a day (BID) | ORAL | 1 refills | Status: DC

## 2022-02-17 NOTE — Patient Instructions (Signed)
It was very nice to see you today!  Your A1c is elevated.  Please increase your metformin to 500 mg twice daily.  I have concerned that you may not be getting enough blood flow into your legs.  This could be causing your symptoms.  Please let me know if you would like for [place] to order a test to measure the blood flow in your legs.  We will see you back in 3 to 6 months.  Come back sooner if needed.  Take care, Dr [name]  PLEASE NOTE:  If you had any lab tests please let us know if you have not heard back within a few days. You may see your results on mychart before we have a chance to review them but we will give you a call once they are reviewed by [place]. If we ordered any referrals today, please let us know if you have not heard from their office within the next week.   Please try these tips to maintain a healthy lifestyle:  Eat at least 3 REAL meals and 1-2 snacks per day.  Aim for no more than 5 hours between eating.  If you eat breakfast, please do so within one hour of getting up.   Each meal should contain half fruits/vegetables, one quarter protein, and one quarter carbs (no bigger than a computer mouse)  Cut down on sweet beverages. This includes juice, soda, and sweet tea.   Drink at least 1 glass of water with each meal and aim for at least 8 glasses per day  Exercise at least 150 minutes every week.

## 2022-02-17 NOTE — Assessment & Plan Note (Signed)
Still not controlled.  His elevated A1c is likely contributing.  We will work on A1c improvement as above.  We discussed gabapentin however he deferred.

## 2022-02-17 NOTE — Assessment & Plan Note (Signed)
A1c not controlled at 8.4.  We will increase his metformin to 500 mg twice daily.  We discussed lifestyle modifications.  Follow-up in 3 months.

## 2022-02-17 NOTE — Progress Notes (Signed)
   Gregory Jensen is a 66 y.o. male who presents today for an office visit.  Assessment/Plan:  New/Acute Problems: Leg Jensen Concern for possible claudication.  We discussed ABIs however he deferred for now and will discuss with his cardiologist.  He likely does have a bit of underlying osteoarthritis in his right knee as well.  Recommended conservative management including Tylenol, Voltaren, compression, and ice as needed.  If his joint Jensen persist would consider referral to PT or sports medicine.  He deferred referral to PT today.  Chronic Problems Addressed Today: Diabetes mellitus without complication (HCC) H7W not controlled at 8.4.  We will increase his metformin to 500 mg twice daily.  We discussed lifestyle modifications.  Follow-up in 3 months.  Peripheral neuropathy Still not controlled.  His elevated A1c is likely contributing.  We will work on A1c improvement as above.  We discussed gabapentin however he deferred.  Dyslipidemia associated with type 2 diabetes mellitus ([place]) Continue management per cardiology.  Nicotine dependence with current use Patient was asked about his tobacco use today and was strongly advised to quit. Patient is currently contemplative. We reviewed treatment options to assist him quit smoking including NRT, Chantix, and Bupropion. Follow up at next office visit.   Total time spent counseling approximately 3 minutes.    Prostate cancer Check PSA.      Subjective:  HPI:  See A/P for status of chronic conditions.  Is also concerned about leg Jensen and right knee Jensen.  This has been going on for quite a while.  He does note that after he walks for 10 to 15 minutes his legs become very tired and needs to stop to rest.  He does have a history of peripheral neuropathy.  No obvious injuries or precipitating events.  No specific treatments tried.         Objective:  Physical Exam: BP 118/66   Pulse 73   Temp 98.5 F (36.9 C) (Temporal)   Ht  5\' 8"  (1.727 m)   Wt 187 lb (84.8 kg)   SpO2 97%   BMI 28.43 kg/m   Gen: No acute distress, resting comfortably CV: Regular rate and rhythm with no murmurs appreciated Pulm: Normal work of breathing, clear to auscultation bilaterally with no crackles, wheezes, or rhonchi MSK:  Legs: 1+ pitting edema to bilateral shins noted.  Distal pulses not palpable however does have good capillary refill. Small amount of crepitus noted on right knee with active and passive range of motion. Neuro: Grossly normal, moves all extremities Psych: Normal affect and thought content      Gregory Isadore M. [name], MD 02/17/2022 9:51 AM

## 2022-02-17 NOTE — Assessment & Plan Note (Signed)
Check PSA. ?

## 2022-02-17 NOTE — Assessment & Plan Note (Signed)
Patient was asked about his tobacco use today and was strongly advised to quit. Patient is currently contemplative. We reviewed treatment options to assist him quit smoking including NRT, Chantix, and Bupropion. Follow up at next office visit.   Total time spent counseling approximately 3 minutes.

## 2022-02-17 NOTE — Assessment & Plan Note (Signed)
Continue management per cardiology.

## 2022-02-18 NOTE — Progress Notes (Signed)
Please inform patient of the following:  Labs are all stable. We can recheck in a year.  Algis Greenhouse. [name], MD 02/18/2022 12:30 PM

## 2022-02-26 ENCOUNTER — Ambulatory Visit
Admission: RE | Admit: 2022-02-26 | Discharge: 2022-02-26 | Disposition: A | Discharge status: Home / Self Care | Payer: HMO | Source: Ambulatory Visit | Attending: Acute Care | Admitting: Acute Care

## 2022-02-26 DIAGNOSIS — F17210 Nicotine dependence, cigarettes, uncomplicated: Secondary | ICD-10-CM

## 2022-03-01 ENCOUNTER — Telehealth: Payer: Self-pay | Admitting: Acute Care

## 2022-03-01 NOTE — Telephone Encounter (Signed)
Received a call from [place] at [facility] for patient's lung cancer screening CT results. Below is a copy of the impression:   IMPRESSION: 1. Lung-RADS 4B, suspicious. Additional imaging evaluation or consultation with Pulmonology or Thoracic Surgery recommended. New solid 12.2 mm posterior right upper lobe pulmonary nodule. PET-CT suggested for further characterization. 2. Three-vessel coronary atherosclerosis. 3. Aortic Atherosclerosis (ICD10-I70.0) and Emphysema (ICD10-J43.9).  Will forward to Judson Roch and the lung cancer screening team.

## 2022-03-03 ENCOUNTER — Telehealth: Payer: Self-pay | Admitting: Acute Care

## 2022-03-03 DIAGNOSIS — R911 Solitary pulmonary nodule: Secondary | ICD-10-CM

## 2022-03-03 NOTE — Telephone Encounter (Signed)
Place him in my nodule slot on 8/8. Thanks.

## 2022-03-03 NOTE — Telephone Encounter (Signed)
ATC pt and schedule on 03/23/22 with Dr. Lamonte Sakai. Front dest if pt calls back please schedule in nodule hold slot on 03/23/22 with Dr. Lamonte Sakai

## 2022-03-03 NOTE — Telephone Encounter (Signed)
I have called the patient with the results of his low dose screening CT Chest. I explained that he has a new nodule in the right upper lobe that was not present on last years scan that measures 1.2 cm. I told him I have reviewed his scan with one of the physicians who works with the screening program and they are in agreement with me that next step should be a PET scan to better evaluate the nodule. He is in agreement with this plan.  Langley Gauss, I will place the order for the PET scan . Once we know the date he is scheduled , please see if we can get him on either Dr. Lamonte Sakai or Icard's schedule to be seen.   Dr. [name], just adding you on so you are aware of the scan results and the plan to follow up. We will schedule everything, but we will keep you in the loop. Let us know if you have any qiestions or concerns.   We will get PFT's if this looks like a single nodule cancer to evaluate pulmonary function for surgery.  He is an every day smoker and will need to quit for surgery to be an option.

## 2022-03-05 NOTE — Telephone Encounter (Signed)
Patient scheduled 03/23/2022 at 1:30pm with Dr. Lamonte Sakai- reminder mailed.

## 2022-03-10 ENCOUNTER — Encounter (HOSPITAL_COMMUNITY)
Admission: RE | Admit: 2022-03-10 | Discharge: 2022-03-10 | Disposition: A | Discharge status: Home / Self Care | Payer: PPO | Source: Ambulatory Visit | Attending: Acute Care | Admitting: Acute Care

## 2022-03-10 DIAGNOSIS — R911 Solitary pulmonary nodule: Secondary | ICD-10-CM | POA: Diagnosis not present

## 2022-03-10 LAB — GLUCOSE, CAPILLARY: Glucose-Capillary: 181 mg/dL — ABNORMAL HIGH (ref 70–99)

## 2022-03-10 MED ORDER — FLUDEOXYGLUCOSE F - 18 (FDG) INJECTION
9.3000 | Freq: Once | INTRAVENOUS | Status: AC | PRN
Start: 2022-03-10 — End: 2022-03-10
  Administered 2022-03-10: 9.35 via INTRAVENOUS

## 2022-03-17 ENCOUNTER — Other Ambulatory Visit: Payer: Self-pay | Admitting: *Deleted

## 2022-03-17 NOTE — Patient Outreach (Signed)
[facility]) Care Management  03/17/2022  Gregory Jensen 29-Oct-1955 [id number]  Unsuccessful initial outreach for Gregory Jensen Management services. Left a message and requested a return call.  Eulah Pont. Myrtie Neither, MSN, [facility] Gerontological Nurse Practitioner [facility] Care Management [phone]

## 2022-03-19 ENCOUNTER — Ambulatory Visit: Payer: Self-pay | Admitting: *Deleted

## 2022-03-19 NOTE — Patient Outreach (Signed)
  Care Coordination   Initial Visit Note   03/19/2022 Name: Gregory Jensen MRN: [id number] DOB: 21-Feb-1956  Gregory Jensen is a 66 y.o. year old male who sees Vivi Barrack, MD for primary care. I spoke with  Gregory Jensen by phone today  What matters to the patients health and wellness today?  NO NEEDS WELL ENGAGED WITH PCP.   SDOH assessments and interventions completed:  No   Care Coordination Interventions Activated:  Yes  Care Coordination Interventions:  No, not indicated   Follow up plan: No further intervention required.   Encounter Outcome:  Pt. Refused   Areonna Bran C. Myrtie Neither, MSN, [facility] Gerontological Nurse Practitioner [facility] Care Management [phone]

## 2022-03-23 ENCOUNTER — Encounter: Payer: Self-pay | Admitting: Emergency Medicine

## 2022-03-23 ENCOUNTER — Ambulatory Visit (INDEPENDENT_AMBULATORY_CARE_PROVIDER_SITE_OTHER): Payer: PPO | Admitting: Emergency Medicine

## 2022-03-23 VITALS — BP 138/80 | HR 84 | Temp 98.7°F | Ht 68.0 in | Wt 187.9 lb

## 2022-03-23 DIAGNOSIS — R911 Solitary pulmonary nodule: Secondary | ICD-10-CM | POA: Diagnosis not present

## 2022-03-23 DIAGNOSIS — F17200 Nicotine dependence, unspecified, uncomplicated: Secondary | ICD-10-CM | POA: Diagnosis not present

## 2022-03-23 NOTE — Addendum Note (Signed)
Addended by: Gavin Potters R on: 03/23/2022 02:17 PM   Modules accepted: Orders

## 2022-03-23 NOTE — Assessment & Plan Note (Signed)
Isolated right upper lobe pulmonary nodule, PET positive but no evidence of distant disease or mediastinal disease.  He does have a history of prostate cancer but I suspect that this is a stage I lung cancer.  Talked him today about the pros and cons of navigational bronchoscopy versus surgical resection versus conservative plan which would be following with serial imaging.  He wants to hear more about the pros and cons of thoracic surgery and I will make the referral today.  I will also get pulmonary function testing.  If he decides he wants a tissue diagnosis first then I can arrange for navigational bronchoscopy.  Also if there would be any benefit to a combination case with T CTS to mark the nodule I will be happy to do this.

## 2022-03-23 NOTE — Patient Instructions (Addendum)
We reviewed your CT scan and PET scan today. We will arrange for pulmonary function testing We will refer you to talk with thoracic surgery about the pros and cons of surgical resection of your small pulmonary nodule. Try to work on decreasing your cigarettes is much as possible.  Ultimate goal will be to stop altogether.  We will try to help you with this. Follow Dr. Lamonte Sakai in 1 month or next available.

## 2022-03-23 NOTE — Progress Notes (Signed)
Subjective:    Patient ID: Gregory Jensen, male    DOB: [birth year], 66 y.o.   MRN: [id number]  HPI 66 year old gentleman with history of tobacco use (47 pack years), prostate cancer, diabetes, hyperlipidemia, sleep apnea.  He participates in lung cancer screening program and had a new 1.2 cm right upper lobe pulmonary nodule noted on most recent imaging.  This prompted PET scan and referral today. He feels well, is dealing with neuropathy, suspected diabetic. Some associated balance issues. He can exert, no breathing limitations. No real cough, does sometimes have a globus sensation. Moves slowly but no SOB.   Lung cancer screening CT chest 02/26/2022 reviewed by me shows no evidence of mediastinal or hilar adenopathy, mild paraseptal emphysematous change and diffuse bronchial wall thickening, stable pulmonary nodules identified and followed from prior CTs but a new 12.2 mm solid posterior right upper lobe nodule.  PET scan 03/10/2022 reviewed by me showed that the 11 mm right upper lobe pulmonary nodule is hypermetabolic.  There is no evidence for thoracic adenopathy or any distant disease.   Review of Systems As per HPI  Past Medical History:  Diagnosis Date   Cancer ([place]) 4 years ago   Prostate Cancer    Diabetes mellitus without complication ([place])    Hyperlipidemia    Sleep apnea    Smoker      Family History  Problem Relation Age of Onset   Alzheimer's disease Mother    Heart disease Father    Hypertension Sister    Heart disease Sister    Hypertension Sister    Stomach cancer Brother    Diabetes Other        family hx   Colon cancer Neg Hx      Social History   Socioeconomic History   Marital status: Significant Other    Spouse name: [name]   Number of children: 1   Years of education: 12   Highest education level: Not on file  Occupational History   Occupation: [employer]    Comment: Retired  Tobacco Use   Smoking status: Every Day    Packs/day:  1.25    Years: 47.00    Total pack years: 58.75    Types: Cigarettes   Smokeless tobacco: Never   Tobacco comments:    1 pack of cigarettes smoked daily ARJ 03/23/22  Vaping Use   Vaping Use: Never used  Substance and Sexual Activity   Alcohol use: No    Alcohol/week: 0.0 standard drinks of alcohol   Drug use: No   Sexual activity: Not on file  Other Topics Concern   Not on file  Social History Narrative   Lives with wife. Has one son who lives in [place].    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     Social Determinants of Health   Financial Resource Strain: Low Risk  (05/14/2021)   Overall Financial Resource Strain (CARDIA)    Difficulty of Paying Living Expenses: Not hard at all  Food Insecurity: No Food Insecurity (05/14/2021)   Hunger Vital Sign    Worried About Running Out of Food in the Last Year: Never true    Ran Out of Food in the Last Year: Never true  Transportation Needs: No Transportation Needs (05/14/2021)   PRAPARE - Hydrologist (Medical): No    Lack of Transportation (Non-Medical): No  Physical Activity: Insufficiently Active (05/14/2021)   Exercise  Vital Sign    Days of Exercise per Week: 3 days    Minutes of Exercise per Session: 30 min  Stress: No Stress Concern Present (05/14/2021)   [place]    Feeling of Stress : Not at all  Social Connections: Moderately Isolated (05/14/2021)   Social Connection and Isolation Panel [NHANES]    Frequency of Communication with Friends and Family: Twice a week    Frequency of Social Gatherings with Friends and Family: Once a week    Attends Religious Services: Never    Marine scientist or Organizations: No    Attends Archivist Meetings: Never    Marital Status: Living with partner  Intimate Partner Violence: Not At Risk (05/14/2021)   Humiliation, Afraid, Rape, and Kick  questionnaire    Fear of Current or Ex-Partner: No    Emotionally Abused: No    Physically Abused: No    Sexually Abused: No     No Known Allergies   Outpatient Medications Prior to Visit  Medication Sig Dispense Refill   aspirin 81 MG EC tablet Take 81 mg by mouth daily.       atorvastatin (LIPITOR) 40 MG tablet Take 1 tablet (40 mg total) by mouth daily. 90 tablet 3   Blood Glucose Monitoring Suppl (TRUE METRIX METER) w/Device KIT USE AS DIRECTED 1 kit 0   fenofibrate (TRICOR) 145 MG tablet Take 1 tablet (145 mg total) by mouth daily. 90 tablet 3   glucose blood (ONETOUCH VERIO) test strip 1 each by Other route daily as needed for other. Use as instructed 100 each 4   metFORMIN (GLUCOPHAGE) 500 MG tablet Take 1 tablet (500 mg total) by mouth 2 (two) times daily with a meal. 90 tablet 1   ONETOUCH DELICA LANCETS 85U MISC 1 each by Does not apply route daily as needed. 100 each 4   No facility-administered medications prior to visit.         Objective:   Physical Exam  Vitals:   03/23/22 1334  BP: 138/80  Pulse: 84  Temp: 98.7 F (37.1 C)  TempSrc: Oral  SpO2: 96%  Weight: 187 lb 14.4 oz (85.2 kg)  Height: '5\' 8"'  (1.727 m)   Gen: Pleasant, well-nourished, in no distress,  normal affect  ENT: No lesions,  mouth clear,  oropharynx clear, no postnasal drip  Neck: No JVD, no stridor  Lungs: No use of accessory muscles, no crackles or wheezing on normal respiration, no wheeze on forced expiration  Cardiovascular: RRR, heart sounds normal, no murmur or gallops, no peripheral edema  Musculoskeletal: No deformities, no cyanosis or clubbing  Neuro: alert, awake, non focal  Skin: Warm, no lesions or rash      Assessment & Plan:  Pulmonary nodule Isolated right upper lobe pulmonary nodule, PET positive but no evidence of distant disease or mediastinal disease.  He does have a history of prostate cancer but I suspect that this is a stage I lung cancer.  Talked him today  about the pros and cons of navigational bronchoscopy versus surgical resection versus conservative plan which would be following with serial imaging.  He wants to hear more about the pros and cons of thoracic surgery and I will make the referral today.  I will also get pulmonary function testing.  If he decides he wants a tissue diagnosis first then I can arrange for navigational bronchoscopy.  Also if there would be any benefit to  a combination case with T CTS to mark the nodule I will be happy to do this.  Nicotine dependence with current use Discussed cessation with him.  He does have emphysematous change on his CT chest.  Depending on his PFT we may decide to start bronchodilator therapy.   Baltazar Apo, MD, PhD 03/23/2022, 2:15 PM [facility] [phone] or if no answer before 7:00PM call [phone] For any issues after 7:00PM please call eLink [phone]

## 2022-03-23 NOTE — Assessment & Plan Note (Signed)
Discussed cessation with him.  He does have emphysematous change on his CT chest.  Depending on his PFT we may decide to start bronchodilator therapy.

## 2022-03-26 ENCOUNTER — Ambulatory Visit (INDEPENDENT_AMBULATORY_CARE_PROVIDER_SITE_OTHER): Payer: PPO | Admitting: Emergency Medicine

## 2022-03-26 DIAGNOSIS — R911 Solitary pulmonary nodule: Secondary | ICD-10-CM

## 2022-03-26 LAB — PULMONARY FUNCTION TEST
DL/VA % pred: 88 %
DL/VA: 3.66 ml/min/mmHg/L
DLCO cor % pred: 64 %
DLCO cor: 15.95 ml/min/mmHg
DLCO unc % pred: 64 %
DLCO unc: 16.04 ml/min/mmHg
FEF 25-75 Post: 1.6 L/sec
FEF 25-75 Pre: 1.77 L/sec
FEF2575-%Change-Post: -9 %
FEF2575-%Pred-Post: 65 %
FEF2575-%Pred-Pre: 72 %
FEV1-%Change-Post: 0 %
FEV1-%Pred-Post: 75 %
FEV1-%Pred-Pre: 75 %
FEV1-Post: 2.35 L
FEV1-Pre: 2.37 L
FEV1FVC-%Change-Post: 5 %
FEV1FVC-%Pred-Pre: 99 %
FEV6-%Change-Post: -4 %
FEV6-%Pred-Post: 75 %
FEV6-%Pred-Pre: 79 %
FEV6-Post: 3.01 L
FEV6-Pre: 3.17 L
FEV6FVC-%Change-Post: 1 %
FEV6FVC-%Pred-Post: 105 %
FEV6FVC-%Pred-Pre: 104 %
FVC-%Change-Post: -6 %
FVC-%Pred-Post: 71 %
FVC-%Pred-Pre: 76 %
FVC-Post: 3.01 L
FVC-Pre: 3.21 L
Post FEV1/FVC ratio: 78 %
Post FEV6/FVC ratio: 100 %
Pre FEV1/FVC ratio: 74 %
Pre FEV6/FVC Ratio: 99 %
RV % pred: 128 %
RV: 2.93 L
TLC % pred: 85 %
TLC: 5.67 L

## 2022-03-26 NOTE — Patient Instructions (Signed)
Full PFT Completed Today 

## 2022-03-26 NOTE — Progress Notes (Signed)
Full PFT Performed Today  

## 2022-04-05 ENCOUNTER — Telehealth: Payer: Self-pay | Admitting: Family Medicine

## 2022-04-05 ENCOUNTER — Other Ambulatory Visit: Payer: Self-pay | Admitting: *Deleted

## 2022-04-05 MED ORDER — METFORMIN HCL 500 MG PO TABS: 500.0000 mg | ORAL_TABLET | Freq: Two times a day (BID) | ORAL | 1 refills | Status: DC

## 2022-04-05 NOTE — Telephone Encounter (Signed)
RX for below with dosage change to 2 x per day sent on 02/17/22 showing as a No Print  LAST APPOINTMENT DATE:  Please schedule appointment if longer than 1 year  7/5.23  NEXT APPOINTMENT DATE: 07/20/22  MEDICATION:metFORMIN (GLUCOPHAGE) 500 MG tablet  Is the patient out of medication? Approx. 4 days left  PHARMACY: [id number] - [place], [place] - [address] Phone:  [phone]  Fax:  [phone]      Let patient know to contact pharmacy at the end of the day to make sure medication is ready.  Please notify patient to allow 48-72 hours to process

## 2022-04-05 NOTE — Telephone Encounter (Signed)
[facility]

## 2022-04-06 NOTE — Progress Notes (Signed)
[address]       [name] [address]             [id number]                    [name] Date of Birth: 02/10/1956  Referring: Collene Gobble, MD Primary Care: Vivi Barrack, MD Primary Cardiologist: None  Chief Complaint:    Chief Complaint  Patient presents with   Lung Lesion    Chest CT 7/14, PET 7/26, PFT's 8/11    History of Present Illness:    Gregory Jensen 66 y.o. male presents for surgical evaluation and of a right upper lobe 1.2 cm pulmonary nodule with increased uptake on PET.  He has a significant smoking history, and was a part of her lung cancer screening program.  The nodule was originally identified CT.  He denies any shortness of breath or productive coughs.  He denies any chest pain.  He does have some balance issues and walks with a cane.     Smoking Hx: He continues to smoke.  47 pack year history   Zubrod Score: At the time of surgery this patient's most appropriate activity status/level should be described as: _0     0    Normal activity, no symptoms _1     1    Restricted in physical strenuous activity but ambulatory, able to do out light work _2     2    Ambulatory and capable of self care, unable to do work activities, up and about               >50 % of waking hours                              _3     3    Only limited self care, in bed greater than 50% of waking hours _4     4    Completely disabled, no self care, confined to bed or chair _5     5    Moribund   Past Medical History:  Diagnosis Date   Cancer ([place]) 4 years ago   Prostate Cancer    Diabetes mellitus without complication ([place])    Hyperlipidemia    Sleep apnea    Smoker     Past Surgical History:  Procedure Laterality Date   BACK SURGERY  15 years ago   COLONOSCOPY  2007 (at age 44)   pt does not know MD name/normal exam per pt.   PROSTATECTOMY  4 years ago    Family History  Problem Relation  Age of Onset   Alzheimer's disease Mother    Heart disease Father    Hypertension Sister    Heart disease Sister    Hypertension Sister    Stomach cancer Brother    Diabetes Other        family hx   Colon cancer Neg Hx      Social History   Tobacco Use  Smoking Status Every Day   Packs/day: 1.25   Years: 47.00   Total pack years: 58.75   Types: Cigarettes  Smokeless Tobacco Never  Tobacco Comments   1 pack of cigarettes smoked daily ARJ 03/23/22    Social History   Substance and Sexual Activity  Alcohol Use No   Alcohol/week: 0.0 standard drinks of alcohol  No Known Allergies  Current Outpatient Medications  Medication Sig Dispense Refill   aspirin 81 MG EC tablet Take 81 mg by mouth daily.       atorvastatin (LIPITOR) 40 MG tablet Take 1 tablet (40 mg total) by mouth daily. 90 tablet 3   Blood Glucose Monitoring Suppl (TRUE METRIX METER) w/Device KIT USE AS DIRECTED 1 kit 0   fenofibrate (TRICOR) 145 MG tablet Take 1 tablet (145 mg total) by mouth daily. 90 tablet 3   glucose blood (ONETOUCH VERIO) test strip 1 each by Other route daily as needed for other. Use as instructed 100 each 4   metFORMIN (GLUCOPHAGE) 500 MG tablet Take 1 tablet (500 mg total) by mouth 2 (two) times daily with a meal. 90 tablet 1   ONETOUCH DELICA LANCETS 50Y MISC 1 each by Does not apply route daily as needed. 100 each 4   No current facility-administered medications for this visit.    Review of Systems  Constitutional:  Negative for malaise/fatigue and weight loss.  Respiratory:  Negative for cough and shortness of breath.   Cardiovascular:  Positive for chest pain.  Neurological:  Negative for dizziness and headaches.     PHYSICAL EXAMINATION: BP (!) 150/72 (BP Location: Left Arm, Patient Position: Sitting)   Pulse 80   Resp 18   Ht _0  (1.727 m)   Wt 185 lb (83.9 kg)   SpO2 98% Comment: RA  BMI 28.13 kg/m  Physical Exam Constitutional:      General: He is not in  acute distress.    Appearance: He is not ill-appearing.  Cardiovascular:     Rate and Rhythm: Normal rate.  Pulmonary:     Effort: Pulmonary effort is normal. No respiratory distress.  Abdominal:     General: Abdomen is flat. There is no distension.  Musculoskeletal:        General: Normal range of motion.     Cervical back: Normal range of motion.  Skin:    General: Skin is warm and dry.  Neurological:     General: No focal deficit present.     Mental Status: He is alert and oriented to person, place, and time.     Diagnostic Studies & Laboratory data:     Recent Radiology Findings:   No results found.     I have independently reviewed the above radiology studies  and reviewed the findings with the patient.   Recent Lab Findings: Lab Results  Component Value Date   WBC 8.3 02/17/2022   HGB 14.8 02/17/2022   HCT 43.2 02/17/2022   PLT 164.0 02/17/2022   GLUCOSE 183 (H) 02/17/2022   CHOL 122 04/06/2022   TRIG 322 (H) 04/06/2022   HDL 21 (L) 04/06/2022   LDLDIRECT 42 04/06/2022   LDLCALC 51 04/06/2022   ALT 20 02/17/2022   AST 18 02/17/2022   NA 142 02/17/2022   K 5.1 02/17/2022   CL 106 02/17/2022   CREATININE 1.17 02/17/2022   BUN 17 02/17/2022   CO2 24 02/17/2022   TSH 5.37 02/17/2022   HGBA1C 8.5 (A) 02/17/2022     PFTs:  - FVC: 76% - FEV1: 75% -DLCO: 64%  Problem List: 1.1 cm PET avid right upper lobe pulmonary nodule Ongoing smoking    Assessment / Plan:   66yo male with 1.1cm RUL pulmonary nodule with increased avidity on PET.  Based on its appearance on cross-sectional imaging this is concerning for primary lung cancer.  The patient does  continue to smoke but states that he is willing to stop.  He would like some time to consider his options and will let us know in the next few weeks.  If he agrees to proceed with surgery I recommended that he undergo a combined navigational bronchoscopy with biopsy by Dr. Lamonte Sakai followed by a robotic assisted  right thoracoscopy with right upper lobectomy as a single anesthetic event with me.  He will require stress test prior to surgery if he agrees to proceed.  I  spent 55 minutes with  the patient face to face in counseling and coordination of care.    Lajuana Matte 04/09/2022 1:29 PM

## 2022-04-07 LAB — LDL CHOLESTEROL, DIRECT: LDL Direct: 42 mg/dL (ref 0–99)

## 2022-04-07 LAB — LIPID PANEL
Chol/HDL Ratio: 5.8 ratio — ABNORMAL HIGH (ref 0.0–5.0)
Cholesterol, Total: 122 mg/dL (ref 100–199)
HDL: 21 mg/dL — ABNORMAL LOW (ref >39)
LDL Chol Calc (NIH): 51 mg/dL (ref 0–99)
Triglycerides: 322 mg/dL — ABNORMAL HIGH (ref 0–149)
VLDL Cholesterol Cal: 50 mg/dL — ABNORMAL HIGH (ref 5–40)

## 2022-04-09 ENCOUNTER — Institutional Professional Consult (permissible substitution): Payer: PPO | Admitting: Thoracic Surgery (Cardiothoracic Vascular Surgery)

## 2022-04-09 VITALS — BP 150/72 | HR 80 | Resp 18 | Ht 68.0 in | Wt 185.0 lb

## 2022-04-09 DIAGNOSIS — R911 Solitary pulmonary nodule: Secondary | ICD-10-CM | POA: Diagnosis not present

## 2022-04-13 ENCOUNTER — Ambulatory Visit (INDEPENDENT_AMBULATORY_CARE_PROVIDER_SITE_OTHER): Payer: PPO | Admitting: Internal Medicine

## 2022-04-13 ENCOUNTER — Encounter (HOSPITAL_BASED_OUTPATIENT_CLINIC_OR_DEPARTMENT_OTHER): Payer: Self-pay | Admitting: Internal Medicine

## 2022-04-13 VITALS — BP 143/76 | HR 67 | Ht 68.0 in | Wt 186.0 lb

## 2022-04-13 DIAGNOSIS — Z01810 Encounter for preprocedural cardiovascular examination: Secondary | ICD-10-CM

## 2022-04-13 DIAGNOSIS — Z79899 Other long term (current) drug therapy: Secondary | ICD-10-CM | POA: Diagnosis not present

## 2022-04-13 DIAGNOSIS — E119 Type 2 diabetes mellitus without complications: Secondary | ICD-10-CM

## 2022-04-13 DIAGNOSIS — E785 Hyperlipidemia, unspecified: Secondary | ICD-10-CM

## 2022-04-13 DIAGNOSIS — I2510 Atherosclerotic heart disease of native coronary artery without angina pectoris: Secondary | ICD-10-CM

## 2022-04-13 MED ORDER — LOSARTAN POTASSIUM 25 MG PO TABS: 25.0000 mg | ORAL_TABLET | Freq: Every day | ORAL | 11 refills | Status: DC

## 2022-04-13 MED ORDER — EMPAGLIFLOZIN 10 MG PO TABS: 10.0000 mg | ORAL_TABLET | Freq: Every day | ORAL | 11 refills | Status: DC

## 2022-04-13 NOTE — Patient Instructions (Signed)
Medication Instructions:  START jardiance 10mg  daily START losartan 25mg  daily   *If you need a refill on your cardiac medications before your next appointment, please call your pharmacy*   Lab Work: Non-Fasting lab test in 1-2 weeks  FASTING lab work about 1 week before next office visit with Dr. Debara Pickett   If you have labs (blood work) drawn today and your tests are completely normal, you will receive your results only by: [place] (if you have MyChart) OR A paper copy in the mail If you have any lab test that is abnormal or we need to change your treatment, we will call you to review the results.   Testing/Procedures: Lexiscan Myoview Stress test -- done at [facility] ([address]) OR [facility] ([place]. [address])   Follow-Up: At [facility], you and your health needs are our priority.  As part of our continuing mission to provide you with exceptional heart care, we have created designated Provider Care Teams.  These Care Teams include your primary Cardiologist (physician) and Advanced Practice Providers (APPs -  Physician Assistants and Nurse Practitioners) who all work together to provide you with the care you need, when you need it.  We recommend signing up for the patient portal called "MyChart".  Sign up information is provided on this After Visit Summary.  MyChart is used to connect with patients for Virtual Visits (Telemedicine).  Patients are able to view lab/test results, encounter notes, upcoming appointments, etc.  Non-urgent messages can be sent to your provider as well.   To learn more about what you can do with MyChart, go to [email or web address].    Your next appointment:    3-4 months with Dr. Debara Pickett

## 2022-04-13 NOTE — Progress Notes (Signed)
LIPID CLINIC CONSULT NOTE  Chief Complaint:  Follow-up dyslipidemia  Primary Care Physician: Vivi Barrack, MD  Primary Cardiologist:  None  HPI:  Gregory Jensen is a 66 y.o. male who is being seen today for the evaluation of dyslipidemia at the request of Vivi Barrack, MD. this is a pleasant 66 year old male kindly referred for evaluation management of dyslipidemia.  He has a history of type 2 diabetes with neuropathy, dyslipidemia, sleep apnea and ongoing tobacco use more than 50 pack years.  He also has a history of prostate cancer.  He had a recent high-resolution CT scan for screening of lung cancer that showed two-vessel coronary calcification and aortic atherosclerosis.  He has had no clinical cardiac events.  He does have a history of heart disease in his sister and his father.  He has also had longstanding high cholesterol, particular high triglycerides he reports since he was a teenager.  In the past his triglycerides have been in the 14-15 100s but he says has also been in the thousands.  He has had some benefit from fenofibrate and most recently in December 2022 his total cholesterol was 122, HDL 23, triglycerides 380 had a direct LDL of 46.  Hemoglobin A1c at that time was 7.7%.  He has been on longstanding atorvastatin more recently on the 80 mg dose but then was also on fenofibrate 160 mg daily.  He thought this combination actually caused him worsening muscle aches.  Subsequently he does continue the fenofibrate and noticed improvement in his symptoms.  He has not had lipids retested since then.  He is also somewhat concerned about his blood pressure which was mildly elevated today 138/59.  He says he monitors it infrequently at home but does have a blood pressure cuff.  He has not been on any medications and does not have a diagnosis of hypertension however is a diabetic would be recommended to be on an ACE inhibitor or ARB for kidney prevention.  Based upon the HOPE  trial data.  04/13/2022  Gregory Jensen returns today for follow-up. He has seen his PCP in the interim. Repeat labwork unfortunately shows worsening hemoglobin A1c now up to 8.5% from 7.7%.  His triglycerides however have improved with total cholesterol now 122, triglycerides of 322, HDL of 21 and LDL of 51.  Unfortunately, he was found to have a hypermetabolic lung nodule confirmed with PET/CT.  He has seen thoracic surgery and pulmonary with plans for robotic assisted thoracotomy and excision.  Dr. Kipp Brood with thoracic surgery had recommended stress testing prior to that given two-vessel coronary disease.  He seems to be asymptomatic however it is reasonable to consider this.  His blood pressure again remains elevated, systolic in the 295J.  In addition as mentioned based on HOPE trial data he would benefit from an ARB or ACE inhibitor for renal protection.  PMHx:  Past Medical History:  Diagnosis Date   Cancer ([place]) 4 years ago   Prostate Cancer    Diabetes mellitus without complication ([place])    Hyperlipidemia    Sleep apnea    Smoker     Past Surgical History:  Procedure Laterality Date   BACK SURGERY  15 years ago   COLONOSCOPY  2007 (at age 69)   pt does not know MD name/normal exam per pt.   PROSTATECTOMY  4 years ago    FAMHx:  Family History  Problem Relation Age of Onset   Alzheimer's disease Mother  Heart disease Father    Hypertension Sister    Heart disease Sister    Hypertension Sister    Stomach cancer Brother    Diabetes Other        family hx   Colon cancer Neg Hx     SOCHx:   reports that he has been smoking cigarettes. He has a 58.75 pack-year smoking history. He has never used smokeless tobacco. He reports that he does not drink alcohol and does not use drugs.  ALLERGIES:  No Known Allergies  ROS: Pertinent items noted in HPI and remainder of comprehensive ROS otherwise negative.  HOME MEDS: Current Outpatient Medications on File Prior to Visit   Medication Sig Dispense Refill   aspirin 81 MG EC tablet Take 81 mg by mouth daily.       atorvastatin (LIPITOR) 40 MG tablet Take 1 tablet (40 mg total) by mouth daily. 90 tablet 3   Blood Glucose Monitoring Suppl (TRUE METRIX METER) w/Device KIT USE AS DIRECTED 1 kit 0   fenofibrate (TRICOR) 145 MG tablet Take 1 tablet (145 mg total) by mouth daily. 90 tablet 3   glucose blood (ONETOUCH VERIO) test strip 1 each by Other route daily as needed for other. Use as instructed 100 each 4   metFORMIN (GLUCOPHAGE) 500 MG tablet Take 1 tablet (500 mg total) by mouth 2 (two) times daily with a meal. 90 tablet 1   ONETOUCH DELICA LANCETS 94W MISC 1 each by Does not apply route daily as needed. 100 each 4   No current facility-administered medications on file prior to visit.    LABS/IMAGING: No results found for this or any previous visit (from the past 48 hour(s)). No results found.  LIPID PANEL:    Component Value Date/Time   CHOL 122 04/06/2022 0821   TRIG 322 (H) 04/06/2022 0821   HDL 21 (L) 04/06/2022 0821   CHOLHDL 5.8 (H) 04/06/2022 0821   CHOLHDL 5 07/28/2021 0933   VLDL 76.0 (H) 07/28/2021 0933   LDLCALC 51 04/06/2022 0821   LDLCALC  07/23/2020 0827     Comment:     . LDL cholesterol not calculated. Triglyceride levels greater than 400 mg/dL invalidate calculated LDL results. . Reference range: <100 . Desirable range <100 mg/dL for primary prevention;   <70 mg/dL for patients with CHD or diabetic patients  with > or = 2 CHD risk factors. Marland Kitchen LDL-C is now calculated using the Martin-Hopkins  calculation, which is a validated novel method providing  better accuracy than the Friedewald equation in the  estimation of LDL-C.  Cresenciano Genre et al. Annamaria Helling. 9675;916(38): 2061-2068  (http://education.[employer].com/faq/FAQ164)    LDLDIRECT 42 04/06/2022 0821   LDLDIRECT 46.0 07/28/2021 0933    WEIGHTS: Wt Readings from Last 3 Encounters:  04/13/22 186 lb (84.4 kg)  04/09/22  185 lb (83.9 kg)  03/23/22 187 lb 14.4 oz (85.2 kg)    VITALS: BP (!) 143/76   Pulse 67   Ht '5\' 8"'  (1.727 m)   Wt 186 lb (84.4 kg)   BMI 28.28 kg/m   EXAM: General appearance: alert and no distress Neck: no carotid bruit, no JVD, and thyroid not enlarged, symmetric, no tenderness/mass/nodules Lungs: clear to auscultation bilaterally Heart: regular rate and rhythm, S1, S2 normal, no murmur, click, rub or gallop Abdomen: soft, non-tender; bowel sounds normal; no masses,  no organomegaly Extremities: extremities normal, atraumatic, no cyanosis or edema and no sores, normal joint discrimination, intact light touch, warm, well-perfused Pulses: 2+ and  symmetric Skin: Skin color, texture, turgor normal. 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   Gregory Jensen continues to have uncontrolled cardiovascular risk factors.  He has two-vessel coronary calcification and aortic atherosclerosis.  His lipid profile is improved significantly with triglycerides now down to 322 from over 1400.  A1c however has climbed recently on metformin.  I would advise adding Jardiance 10 mg daily for cardiovascular benefit and additional A1c lowering.  In addition we will start losartan 25 mg daily for systolic hypertension and renal protective benefit.  He will need a repeat metabolic profile in a couple weeks to ensure stable renal function.  I have encouraged hydration especially with the Jardiance and advised him to watch for any signs or symptoms of urinary tract infection or perineal infection.  We will go ahead and arrange for Lexiscan Myoview  stress test preoperatively within the next week.  Ultimately I have advised that he proceed with bronchoscopy and excision of the hypermetabolic pulmonary nodule which is likely cancerous.  Plan follow-up with me in about 3 months with a repeat A1c and lipid profile.  Pixie Casino, MD, [facility] [place] Director of the Advanced Lipid Disorders &  Cardiovascular Risk Reduction Clinic Diplomate of the American Board of Clinical Lipidology Attending Cardiologist  Direct Dial: [phone]  Fax: [phone]  Website:  [email or web address] [email or web address] Syre Knerr 04/13/2022, 10:09 AM

## 2022-04-14 ENCOUNTER — Telehealth (HOSPITAL_COMMUNITY): Payer: Self-pay | Admitting: *Deleted

## 2022-04-14 NOTE — Telephone Encounter (Signed)
Close encounter 

## 2022-04-15 ENCOUNTER — Ambulatory Visit (HOSPITAL_COMMUNITY)
Admission: RE | Admit: 2022-04-15 | Discharge: 2022-04-15 | Disposition: A | Discharge status: Home / Self Care | Payer: PPO | Source: Ambulatory Visit | Attending: Cardiology | Admitting: Cardiology

## 2022-04-15 DIAGNOSIS — E119 Type 2 diabetes mellitus without complications: Secondary | ICD-10-CM | POA: Diagnosis not present

## 2022-04-15 DIAGNOSIS — Z79899 Other long term (current) drug therapy: Secondary | ICD-10-CM

## 2022-04-15 DIAGNOSIS — E785 Hyperlipidemia, unspecified: Secondary | ICD-10-CM | POA: Diagnosis not present

## 2022-04-15 DIAGNOSIS — I2510 Atherosclerotic heart disease of native coronary artery without angina pectoris: Secondary | ICD-10-CM

## 2022-04-15 MED ORDER — TECHNETIUM TC 99M TETROFOSMIN IV KIT
29.2000 | PACK | Freq: Once | INTRAVENOUS | Status: AC | PRN
  Administered 2022-04-15: 29.2 via INTRAVENOUS

## 2022-04-15 MED ORDER — TECHNETIUM TC 99M TETROFOSMIN IV KIT
10.6000 | PACK | Freq: Once | INTRAVENOUS | Status: AC | PRN
  Administered 2022-04-15: 10.6 via INTRAVENOUS

## 2022-04-15 MED ORDER — REGADENOSON 0.4 MG/5ML IV SOLN
0.4000 mg | Freq: Once | INTRAVENOUS | Status: AC
  Administered 2022-04-15: 0.4 mg via INTRAVENOUS

## 2022-04-16 LAB — MYOCARDIAL PERFUSION IMAGING
LV dias vol: 128 mL (ref 62–150)
LV sys vol: 65 mL
Nuc Stress EF: 49 %
Peak HR: 91 {beats}/min
Rest HR: 64 {beats}/min
Rest Nuclear Isotope Dose: 10.6 mCi
SDS: 3
SRS: 3
SSS: 6
ST Depression (mm): 0 mm
Stress Nuclear Isotope Dose: 29.2 mCi
TID: 1.11

## 2022-04-22 ENCOUNTER — Encounter: Payer: Self-pay | Admitting: *Deleted

## 2022-04-22 ENCOUNTER — Other Ambulatory Visit: Payer: Self-pay | Admitting: *Deleted

## 2022-04-22 DIAGNOSIS — R911 Solitary pulmonary nodule: Secondary | ICD-10-CM

## 2022-04-24 ENCOUNTER — Telehealth: Payer: Self-pay | Admitting: Emergency Medicine

## 2022-04-24 DIAGNOSIS — R911 Solitary pulmonary nodule: Secondary | ICD-10-CM

## 2022-04-24 NOTE — Telephone Encounter (Signed)
Have coordinated with [facility] to perform robotic navigation bronchoscopy on 9/25 at 7:30am. Case is booked.  Need to set up COVID testing I will order Super D CT chest today

## 2022-04-26 NOTE — Telephone Encounter (Signed)
I have scheduled CT and gave appt info to pt.  He will get covid test at preadmit testing per note on appt desk.  Nothing further needed.

## 2022-04-28 ENCOUNTER — Encounter: Payer: Self-pay | Admitting: Emergency Medicine

## 2022-04-28 ENCOUNTER — Ambulatory Visit (INDEPENDENT_AMBULATORY_CARE_PROVIDER_SITE_OTHER): Payer: PPO | Admitting: Emergency Medicine

## 2022-04-28 DIAGNOSIS — R911 Solitary pulmonary nodule: Secondary | ICD-10-CM

## 2022-04-28 DIAGNOSIS — F17200 Nicotine dependence, unspecified, uncomplicated: Secondary | ICD-10-CM | POA: Diagnosis not present

## 2022-04-28 LAB — BASIC METABOLIC PANEL
BUN/Creatinine Ratio: 16 (ref 10–24)
BUN: 19 mg/dL (ref 8–27)
CO2: 20 mmol/L (ref 20–29)
Calcium: 9.4 mg/dL (ref 8.6–10.2)
Chloride: 102 mmol/L (ref 96–106)
Creatinine, Ser: 1.19 mg/dL (ref 0.76–1.27)
Glucose: 122 mg/dL — ABNORMAL HIGH (ref 70–99)
Potassium: 4.2 mmol/L (ref 3.5–5.2)
Sodium: 138 mmol/L (ref 134–144)
eGFR: 67 mL/min/{1.73_m2} (ref >59)

## 2022-04-28 NOTE — Patient Instructions (Signed)
Please get your CT scan on 9/19 as planned. We will perform your navigational bronchoscopy and pulmonary nodule resection as planned on 05/10/2022. We will hold off on starting any inhaled medication at this time.  We might reconsider this depending on how your breathing does going forward. Follow Dr. Lamonte Sakai in 3 months, please call sooner if you have any problems.

## 2022-04-28 NOTE — Progress Notes (Signed)
Subjective:    Patient ID: Gregory Jensen, male    DOB: Jun 03, 1956, 66 y.o.   MRN: [id number]  HPI 66 year old gentleman with history of tobacco use (47 pack years), prostate cancer, diabetes, hyperlipidemia, sleep apnea.  He participates in lung cancer screening program and had a new 1.2 cm right upper lobe pulmonary nodule noted on most recent imaging.  This prompted PET scan and referral today. He feels well, is dealing with neuropathy, suspected diabetic. Some associated balance issues. He can exert, no breathing limitations. No real cough, does sometimes have a globus sensation. Moves slowly but no SOB.   Lung cancer screening CT chest 02/26/2022 reviewed by me shows no evidence of mediastinal or hilar adenopathy, mild paraseptal emphysematous change and diffuse bronchial wall thickening, stable pulmonary nodules identified and followed from prior CTs but a new 12.2 mm solid posterior right upper lobe nodule.  PET scan 03/10/2022 reviewed by me showed that the 11 mm right upper lobe pulmonary nodule is hypermetabolic.  There is no evidence for thoracic adenopathy or any distant disease.   ROV 04/28/2022 --Mr. Gregory Jensen is 66 with history of smoking, prostate cancer, diabetes, hyperlipidemia, sleep apnea.  We have been evaluating a hypermetabolic 11 mm right upper lobe pulmonary nodule that appears to be isolated, consistent with stage I non-small cell lung cancer.  He has seen Dr. Kipp Brood and we are planning for navigational bronchoscopy for dye marking and then subsequent robotic VATS resection to be done on 05/10/2022.  He is scheduled for super D CT scan of the chest on 9/19 in preparation for that procedure.  He is not currently on bronchodilator therapy. He has cut down to 1-2 cig a day.   Pulmonary function testing 03/26/2022 reviewed by me shows mixed obstruction and restriction with FEV1 75% predicted, normal lung volumes, decreased diffusion capacity that corrects to the normal range  when adjusted for alveolar volume.  Review of Systems As per HPI  Past Medical History:  Diagnosis Date   Cancer ([facility]) 4 years ago   Prostate Cancer    Diabetes mellitus without complication ([facility])    Hyperlipidemia    Sleep apnea    Smoker      Family History  Problem Relation Age of Onset   Alzheimer's disease Mother    Heart disease Father    Hypertension Sister    Heart disease Sister    Hypertension Sister    Stomach cancer Brother    Diabetes Other        family hx   Colon cancer Neg Hx      Social History   Socioeconomic History   Marital status: Significant Other    Spouse name: [name]   Number of children: 1   Years of education: 12   Highest education level: Not on file  Occupational History   Occupation: [employer]    Comment: Retired  Tobacco Use   Smoking status: Every Day    Packs/day: 1.25    Years: 47.00    Total pack years: 58.75    Types: Cigarettes   Smokeless tobacco: Never   Tobacco comments:    3 cigarettes smoked daily ARJ 04/28/22  Vaping Use   Vaping Use: Never used  Substance and Sexual Activity   Alcohol use: No    Alcohol/week: 0.0 standard drinks of alcohol   Drug use: No   Sexual activity: Not on file  Other Topics Concern   Not on file  Social History Narrative  Lives with wife. Has one son who lives in [place].    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     Social Determinants of Health   Financial Resource Strain: Low Risk  (05/14/2021)   Overall Financial Resource Strain (CARDIA)    Difficulty of Paying Living Expenses: Not hard at all  Food Insecurity: No Food Insecurity (05/14/2021)   Hunger Vital Sign    Worried About Running Out of Food in the Last Year: Never true    Ran Out of Food in the Last Year: Never true  Transportation Needs: No Transportation Needs (05/14/2021)   PRAPARE - Hydrologist (Medical): No    Lack of Transportation  (Non-Medical): No  Physical Activity: Insufficiently Active (05/14/2021)   Exercise Vital Sign    Days of Exercise per Week: 3 days    Minutes of Exercise per Session: 30 min  Stress: No Stress Concern Present (05/14/2021)   [place]    Feeling of Stress : Not at all  Social Connections: Moderately Isolated (05/14/2021)   Social Connection and Isolation Panel [NHANES]    Frequency of Communication with Friends and Family: Twice a week    Frequency of Social Gatherings with Friends and Family: Once a week    Attends Religious Services: Never    Marine scientist or Organizations: No    Attends Archivist Meetings: Never    Marital Status: Living with partner  Intimate Partner Violence: Not At Risk (05/14/2021)   Humiliation, Afraid, Rape, and Kick questionnaire    Fear of Current or Ex-Partner: No    Emotionally Abused: No    Physically Abused: No    Sexually Abused: No     No Known Allergies   Outpatient Medications Prior to Visit  Medication Sig Dispense Refill   aspirin 81 MG EC tablet Take 81 mg by mouth daily.       atorvastatin (LIPITOR) 40 MG tablet Take 1 tablet (40 mg total) by mouth daily. 90 tablet 3   Blood Glucose Monitoring Suppl (TRUE METRIX METER) w/Device KIT USE AS DIRECTED 1 kit 0   empagliflozin (JARDIANCE) 10 MG TABS tablet Take 1 tablet (10 mg total) by mouth daily before breakfast. 30 tablet 11   fenofibrate (TRICOR) 145 MG tablet Take 1 tablet (145 mg total) by mouth daily. 90 tablet 3   glucose blood (ONETOUCH VERIO) test strip 1 each by Other route daily as needed for other. Use as instructed 100 each 4   losartan (COZAAR) 25 MG tablet Take 1 tablet (25 mg total) by mouth daily. 30 tablet 11   metFORMIN (GLUCOPHAGE) 500 MG tablet Take 1 tablet (500 mg total) by mouth 2 (two) times daily with a meal. 90 tablet 1   ONETOUCH DELICA LANCETS 62Z MISC 1 each by Does not apply route  daily as needed. 100 each 4   No facility-administered medications prior to visit.         Objective:   Physical Exam  Vitals:   04/28/22 1131  BP: 122/76  Pulse: 62  Temp: 98.7 F (37.1 C)  TempSrc: Oral  SpO2: 97%  Weight: 182 lb 9.6 oz (82.8 kg)  Height: 5' 8" (1.727 m)   Gen: Pleasant, well-nourished, in no distress,  normal affect  ENT: No lesions,  mouth clear,  oropharynx clear, no postnasal drip  Neck: No JVD, no stridor  Lungs: No use of accessory muscles, no crackles or wheezing on normal respiration, no wheeze on forced expiration  Cardiovascular: RRR, heart sounds normal, no murmur or gallops, no peripheral edema  Musculoskeletal: No deformities, no cyanosis or clubbing  Neuro: alert, awake, non focal  Skin: Warm, no lesions or rash      Assessment & Plan:  Pulmonary nodule Hypermetabolic right upper lobe pulmonary nodule consistent with a primary non-small cell lung cancer.  Planning for navigational bronchoscopy for dye marking and biopsy, then robotic VATS resection and probable lobectomy.  Discussed the plans with him today.  He will have a super D CT prior to our procedure on 05/10/2022.  Nicotine dependence with current use He has cut down significantly.  Encouraged him in this.  Smoking 1-2 cigarettes daily.  His plan is to stop altogether soon, prior to his procedure 9/25.  I will see him in about 3 months after his surgery so we can assess respiratory status following resection.  It may be that he would benefit from bronchodilator therapy at that time if he is noticing dyspnea.   Baltazar Apo, MD, PhD 04/28/2022, 11:46 AM [facility] [phone] or if no answer before 7:00PM call [phone] For any issues after 7:00PM please call eLink [phone]

## 2022-04-28 NOTE — Assessment & Plan Note (Signed)
He has cut down significantly.  Encouraged him in this.  Smoking 1-2 cigarettes daily.  His plan is to stop altogether soon, prior to his procedure 9/25.  I will see him in about 3 months after his surgery so we can assess respiratory status following resection.  It may be that he would benefit from bronchodilator therapy at that time if he is noticing dyspnea.

## 2022-04-28 NOTE — Assessment & Plan Note (Signed)
Hypermetabolic right upper lobe pulmonary nodule consistent with a primary non-small cell lung cancer.  Planning for navigational bronchoscopy for dye marking and biopsy, then robotic VATS resection and probable lobectomy.  Discussed the plans with him today.  He will have a super D CT prior to our procedure on 05/10/2022.

## 2022-04-28 NOTE — H&P (View-Only) (Signed)
 Subjective:    Patient ID: Gregory Jensen, male    DOB: 03/27/1956, 66 y.o.   MRN: [id number]  HPI 66-year-old gentleman with history of tobacco use (47 pack years), prostate cancer, diabetes, hyperlipidemia, sleep apnea.  He participates in lung cancer screening program and had a new 1.2 cm right upper lobe pulmonary nodule noted on most recent imaging.  This prompted PET scan and referral today. He feels well, is dealing with neuropathy, suspected diabetic. Some associated balance issues. He can exert, no breathing limitations. No real cough, does sometimes have a globus sensation. Moves slowly but no SOB.   Lung cancer screening CT chest 02/26/2022 reviewed by me shows no evidence of mediastinal or hilar adenopathy, mild paraseptal emphysematous change and diffuse bronchial wall thickening, stable pulmonary nodules identified and followed from prior CTs but a new 12.2 mm solid posterior right upper lobe nodule.  PET scan 03/10/2022 reviewed by me showed that the 11 mm right upper lobe pulmonary nodule is hypermetabolic.  There is no evidence for thoracic adenopathy or any distant disease.   ROV 04/28/2022 --Mr. Jamarea is 66 with history of smoking, prostate cancer, diabetes, hyperlipidemia, sleep apnea.  We have been evaluating a hypermetabolic 11 mm right upper lobe pulmonary nodule that appears to be isolated, consistent with stage I non-small cell lung cancer.  He has seen Dr. Lightfoot and we are planning for navigational bronchoscopy for dye marking and then subsequent robotic VATS resection to be done on 05/10/2022.  He is scheduled for super D CT scan of the chest on 9/19 in preparation for that procedure.  He is not currently on bronchodilator therapy. He has cut down to 1-2 cig a day.   Pulmonary function testing 03/26/2022 reviewed by me shows mixed obstruction and restriction with FEV1 75% predicted, normal lung volumes, decreased diffusion capacity that corrects to the normal range  when adjusted for alveolar volume.  Review of Systems As per HPI  Past Medical History:  Diagnosis Date   Cancer ([facility]) 4 years ago   Prostate Cancer    Diabetes mellitus without complication ([facility])    Hyperlipidemia    Sleep apnea    Smoker      Family History  Problem Relation Age of Onset   Alzheimer's disease Mother    Heart disease Father    Hypertension Sister    Heart disease Sister    Hypertension Sister    Stomach cancer Brother    Diabetes Other        family hx   Colon cancer Neg Hx      Social History   Socioeconomic History   Marital status: Significant Other    Spouse name: Patricia   Number of children: 1   Years of education: 12   Highest education level: Not on file  Occupational History   Occupation: [employer]    Comment: Retired  Tobacco Use   Smoking status: Every Day    Packs/day: 1.25    Years: 47.00    Total pack years: 58.75    Types: Cigarettes   Smokeless tobacco: Never   Tobacco comments:    3 cigarettes smoked daily ARJ 04/28/22  Vaping Use   Vaping Use: Never used  Substance and Sexual Activity   Alcohol use: No    Alcohol/week: 0.0 standard drinks of alcohol   Drug use: No   Sexual activity: Not on file  Other Topics Concern   Not on file  Social History Narrative     Lives with wife. Has one son who lives in [place].    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     Social Determinants of Health   Financial Resource Strain: Low Risk  (05/14/2021)   Overall Financial Resource Strain (CARDIA)    Difficulty of Paying Living Expenses: Not hard at all  Food Insecurity: No Food Insecurity (05/14/2021)   Hunger Vital Sign    Worried About Running Out of Food in the Last Year: Never true    Ran Out of Food in the Last Year: Never true  Transportation Needs: No Transportation Needs (05/14/2021)   PRAPARE - Transportation    Lack of Transportation (Medical): No    Lack of Transportation  (Non-Medical): No  Physical Activity: Insufficiently Active (05/14/2021)   Exercise Vital Sign    Days of Exercise per Week: 3 days    Minutes of Exercise per Session: 30 min  Stress: No Stress Concern Present (05/14/2021)   [employer] - Occupational Stress Questionnaire    Feeling of Stress : Not at all  Social Connections: Moderately Isolated (05/14/2021)   Social Connection and Isolation Panel [NHANES]    Frequency of Communication with Friends and Family: Twice a week    Frequency of Social Gatherings with Friends and Family: Once a week    Attends Religious Services: Never    Active Member of Clubs or Organizations: No    Attends Club or Organization Meetings: Never    Marital Status: Living with partner  Intimate Partner Violence: Not At Risk (05/14/2021)   Humiliation, Afraid, Rape, and Kick questionnaire    Fear of Current or Ex-Partner: No    Emotionally Abused: No    Physically Abused: No    Sexually Abused: No     No Known Allergies   Outpatient Medications Prior to Visit  Medication Sig Dispense Refill   aspirin 81 MG EC tablet Take 81 mg by mouth daily.       atorvastatin (LIPITOR) 40 MG tablet Take 1 tablet (40 mg total) by mouth daily. 90 tablet 3   Blood Glucose Monitoring Suppl (TRUE METRIX METER) w/Device KIT USE AS DIRECTED 1 kit 0   empagliflozin (JARDIANCE) 10 MG TABS tablet Take 1 tablet (10 mg total) by mouth daily before breakfast. 30 tablet 11   fenofibrate (TRICOR) 145 MG tablet Take 1 tablet (145 mg total) by mouth daily. 90 tablet 3   glucose blood (ONETOUCH VERIO) test strip 1 each by Other route daily as needed for other. Use as instructed 100 each 4   losartan (COZAAR) 25 MG tablet Take 1 tablet (25 mg total) by mouth daily. 30 tablet 11   metFORMIN (GLUCOPHAGE) 500 MG tablet Take 1 tablet (500 mg total) by mouth 2 (two) times daily with a meal. 90 tablet 1   ONETOUCH DELICA LANCETS 33G MISC 1 each by Does not apply route  daily as needed. 100 each 4   No facility-administered medications prior to visit.         Objective:   Physical Exam  Vitals:   04/28/22 1131  BP: 122/76  Pulse: 62  Temp: 98.7 F (37.1 C)  TempSrc: Oral  SpO2: 97%  Weight: 182 lb 9.6 oz (82.8 kg)  Height: 5' 8" (1.727 m)   Gen: Pleasant, well-nourished, in no distress,  normal affect  ENT: No lesions,  mouth clear,  oropharynx clear, no postnasal drip  Neck: No JVD, no stridor    Lungs: No use of accessory muscles, no crackles or wheezing on normal respiration, no wheeze on forced expiration  Cardiovascular: RRR, heart sounds normal, no murmur or gallops, no peripheral edema  Musculoskeletal: No deformities, no cyanosis or clubbing  Neuro: alert, awake, non focal  Skin: Warm, no lesions or rash      Assessment & Plan:  Pulmonary nodule Hypermetabolic right upper lobe pulmonary nodule consistent with a primary non-small cell lung cancer.  Planning for navigational bronchoscopy for dye marking and biopsy, then robotic VATS resection and probable lobectomy.  Discussed the plans with him today.  He will have a super D CT prior to our procedure on 05/10/2022.  Nicotine dependence with current use He has cut down significantly.  Encouraged him in this.  Smoking 1-2 cigarettes daily.  His plan is to stop altogether soon, prior to his procedure 9/25.  I will see him in about 3 months after his surgery so we can assess respiratory status following resection.  It may be that he would benefit from bronchodilator therapy at that time if he is noticing dyspnea.   Flora Parks, MD, PhD 04/28/2022, 11:46 AM [facility] [phone] or if no answer before 7:00PM call [phone] For any issues after 7:00PM please call eLink [phone]   

## 2022-05-04 ENCOUNTER — Ambulatory Visit (HOSPITAL_COMMUNITY)
Admission: RE | Admit: 2022-05-04 | Discharge: 2022-05-04 | Disposition: A | Discharge status: Home / Self Care | Payer: PPO | Source: Ambulatory Visit | Attending: Emergency Medicine | Admitting: Emergency Medicine

## 2022-05-04 DIAGNOSIS — R911 Solitary pulmonary nodule: Secondary | ICD-10-CM | POA: Diagnosis present

## 2022-05-05 NOTE — Pre-Procedure Instructions (Signed)
Surgical Instructions    Your procedure is scheduled on Monday 05/10/22.   Report to [facility] Main Entrance "A" at 05:30 A.M., then check in with the Admitting office.  Call this number if you have problems the morning of surgery:  [phone]   If you have any questions prior to your surgery date call [phone]: Open Monday-Friday 8am-4pm    Remember:  Do not eat or drink after midnight the night before your surgery     Take these medicines the morning of surgery with A SIP OF WATER:   fenofibrate (TRICOR)    As of today, STOP taking any Aspirin (unless otherwise instructed by your surgeon) Aleve, Naproxen, Ibuprofen, Motrin, Advil, Goody's, BC's, all herbal medications, fish oil, and all vitamins.  WHAT DO I DO ABOUT MY DIABETES MEDICATION?   Do not take oral diabetes medicines (pills) the morning of surgery.  DO NOT TAKE empagliflozin (JARDIANCE) three days prior to surgery. Last dose should be taken on 05/06/22. DO NOT TAKE JARDIANCE 9/22, 9/23, and 9/24.   DO NOT TAKE metFORMIN (GLUCOPHAGE) the morning of surgery.   The day of surgery, do not take other diabetes injectables, including Byetta (exenatide), Bydureon (exenatide ER), Victoza (liraglutide), or Trulicity (dulaglutide).  HOW TO MANAGE YOUR DIABETES BEFORE AND AFTER SURGERY  Why is it important to control my blood sugar before and after surgery? Improving blood sugar levels before and after surgery helps healing and can limit problems. A way of improving blood sugar control is eating a healthy diet by:  Eating less sugar and carbohydrates  Increasing activity/exercise  Talking with your doctor about reaching your blood sugar goals High blood sugars (greater than 180 mg/dL) can raise your risk of infections and slow your recovery, so you will need to focus on controlling your diabetes during the weeks before surgery. Make sure that the doctor who takes care of your diabetes knows about your planned surgery  including the date and location.  How do I manage my blood sugar before surgery? Check your blood sugar at least 4 times a day, starting 2 days before surgery, to make sure that the level is not too high or low.  Check your blood sugar the morning of your surgery when you wake up and every 2 hours until you get to the Short Stay unit.  If your blood sugar is less than 70 mg/dL, you will need to treat for low blood sugar: Do not take insulin. Treat a low blood sugar (less than 70 mg/dL) with  cup of clear juice (cranberry or apple), 4 glucose tablets, OR glucose gel. Recheck blood sugar in 15 minutes after treatment (to make sure it is greater than 70 mg/dL). If your blood sugar is not greater than 70 mg/dL on recheck, call [phone] for further instructions. Report your blood sugar to the short stay nurse when you get to Short Stay.  If you are admitted to the hospital after surgery: Your blood sugar will be checked by the staff and you will probably be given insulin after surgery (instead of oral diabetes medicines) to make sure you have good blood sugar levels. The goal for blood sugar control after surgery is 80-180 mg/dL.           Do not wear jewelry or makeup. Do not wear lotions, powders, perfumes/cologne or deodorant. Do not shave 48 hours prior to surgery.  Men may shave face and neck. Do not bring valuables to the hospital. Do not wear nail polish,  gel polish, artificial nails, or any other type of covering on natural nails (fingers and toes) If you have artificial nails or gel coating that need to be removed by a nail salon, please have this removed prior to surgery. Artificial nails or gel coating may interfere with anesthesia's ability to adequately monitor your vital signs.  [facility] is not responsible for any belongings or valuables.    Do NOT Smoke (Tobacco/Vaping)  24 hours prior to your procedure  If you use a CPAP at night, you may bring your mask for your  overnight stay.   Contacts, glasses, hearing aids, dentures or partials may not be worn into surgery, please bring cases for these belongings   For patients admitted to the hospital, discharge time will be determined by your treatment team.   Patients discharged the day of surgery will not be allowed to drive home, and someone needs to stay with them for 24 hours.   SURGICAL WAITING ROOM VISITATION Patients having surgery or a procedure may have no more than 2 support people in the waiting area - these visitors may rotate.   Children under the age of 37 must have an adult with them who is not the patient. If the patient needs to stay at the hospital during part of their recovery, the visitor guidelines for inpatient rooms apply. Pre-op nurse will coordinate an appropriate time for 1 support person to accompany patient in pre-op.  This support person may not rotate.   Please refer to the [facility] website for the visitor guidelines for Inpatients (after your surgery is over and you are in a regular room).    Special instructions:    Oral Hygiene is also important to reduce your risk of infection.  Remember - BRUSH YOUR TEETH THE MORNING OF SURGERY WITH YOUR REGULAR TOOTHPASTE   [facility]- Preparing For Surgery  Before surgery, you can play an important role. Because skin is not sterile, your skin needs to be as free of germs as possible. You can reduce the number of germs on your skin by washing with CHG (chlorahexidine gluconate) Soap before surgery.  CHG is an antiseptic cleaner which kills germs and bonds with the skin to continue killing germs even after washing.     Please do not use if you have an allergy to CHG or antibacterial soaps. If your skin becomes reddened/irritated stop using the CHG.  Do not shave (including legs and underarms) for at least 48 hours prior to first CHG shower. It is OK to shave your face.  Please follow these instructions carefully.     Shower the  NIGHT BEFORE SURGERY and the MORNING OF SURGERY with CHG Soap.   If you chose to wash your hair, wash your hair first as usual with your normal shampoo. After you shampoo, rinse your hair and body thoroughly to remove the shampoo.  Then [employer] and genitals (private parts) with your normal soap and rinse thoroughly to remove soap.  After that Use CHG Soap as you would any other liquid soap. You can apply CHG directly to the skin and wash gently with a scrungie or a clean washcloth.   Apply the CHG Soap to your body ONLY FROM THE NECK DOWN.  Do not use on open wounds or open sores. Avoid contact with your eyes, ears, mouth and genitals (private parts). Wash Face and genitals (private parts)  with your normal soap.   Wash thoroughly, paying special attention to the area where your  surgery will be performed.  Thoroughly rinse your body with warm water from the neck down.  DO NOT shower/wash with your normal soap after using and rinsing off the CHG Soap.  Pat yourself dry with a CLEAN TOWEL.  Wear CLEAN PAJAMAS to bed the night before surgery  Place CLEAN SHEETS on your bed the night before your surgery  DO NOT SLEEP WITH PETS.   Day of Surgery:  Take a shower with CHG soap. Wear Clean/Comfortable clothing the morning of surgery Do not apply any deodorants/lotions.   Remember to brush your teeth WITH YOUR REGULAR TOOTHPASTE.    If you received a COVID test during your pre-op visit, it is requested that you wear a mask when out in public, stay away from anyone that may not be feeling well, and notify your surgeon if you develop symptoms. If you have been in contact with anyone that has tested positive in the last 10 days, please notify your surgeon.    Please read over the following fact sheets that you were given.

## 2022-05-06 ENCOUNTER — Encounter (HOSPITAL_COMMUNITY)
Admission: RE | Admit: 2022-05-06 | Discharge: 2022-05-06 | Disposition: A | Discharge status: Home / Self Care | Payer: PPO | Source: Ambulatory Visit | Attending: Emergency Medicine | Admitting: Emergency Medicine

## 2022-05-06 ENCOUNTER — Other Ambulatory Visit: Payer: Self-pay

## 2022-05-06 ENCOUNTER — Encounter (HOSPITAL_COMMUNITY): Payer: Self-pay

## 2022-05-06 VITALS — BP 115/66 | HR 67 | Temp 98.1°F | Resp 16 | Ht 68.0 in | Wt 183.7 lb

## 2022-05-06 DIAGNOSIS — E119 Type 2 diabetes mellitus without complications: Secondary | ICD-10-CM | POA: Diagnosis not present

## 2022-05-06 DIAGNOSIS — R911 Solitary pulmonary nodule: Secondary | ICD-10-CM | POA: Diagnosis not present

## 2022-05-06 DIAGNOSIS — Z20822 Contact with and (suspected) exposure to covid-19: Secondary | ICD-10-CM | POA: Diagnosis not present

## 2022-05-06 DIAGNOSIS — G4733 Obstructive sleep apnea (adult) (pediatric): Secondary | ICD-10-CM | POA: Insufficient documentation

## 2022-05-06 DIAGNOSIS — Z01818 Encounter for other preprocedural examination: Secondary | ICD-10-CM

## 2022-05-06 DIAGNOSIS — F17200 Nicotine dependence, unspecified, uncomplicated: Secondary | ICD-10-CM | POA: Diagnosis not present

## 2022-05-06 DIAGNOSIS — C7982 Secondary malignant neoplasm of genital organs: Secondary | ICD-10-CM | POA: Insufficient documentation

## 2022-05-06 DIAGNOSIS — I700 Atherosclerosis of aorta: Secondary | ICD-10-CM | POA: Diagnosis not present

## 2022-05-06 DIAGNOSIS — Z01812 Encounter for preprocedural laboratory examination: Secondary | ICD-10-CM | POA: Diagnosis present

## 2022-05-06 DIAGNOSIS — E785 Hyperlipidemia, unspecified: Secondary | ICD-10-CM | POA: Diagnosis not present

## 2022-05-06 DIAGNOSIS — G629 Polyneuropathy, unspecified: Secondary | ICD-10-CM | POA: Insufficient documentation

## 2022-05-06 HISTORY — DX: Polyneuropathy, unspecified: G62.9

## 2022-05-06 LAB — URINALYSIS, ROUTINE W REFLEX MICROSCOPIC
Bacteria, UA: NONE SEEN
Bilirubin Urine: NEGATIVE
Glucose, UA: >=500 mg/dL — AB
Hgb urine dipstick: NEGATIVE
Ketones, ur: NEGATIVE mg/dL
Leukocytes,Ua: NEGATIVE
Nitrite: NEGATIVE
Protein, ur: NEGATIVE mg/dL
Specific Gravity, Urine: 1.024 (ref 1.005–1.030)
pH: 6 (ref 5.0–8.0)

## 2022-05-06 LAB — CBC
HCT: 42.5 % (ref 39.0–52.0)
Hemoglobin: 14.2 g/dL (ref 13.0–17.0)
MCH: 31.7 pg (ref 26.0–34.0)
MCHC: 33.4 g/dL (ref 30.0–36.0)
MCV: 94.9 fL (ref 80.0–100.0)
Platelets: 184 10*3/uL (ref 150–400)
RBC: 4.48 MIL/uL (ref 4.22–5.81)
RDW: 12.4 % (ref 11.5–15.5)
WBC: 8.2 10*3/uL (ref 4.0–10.5)
nRBC: 0 % (ref 0.0–0.2)

## 2022-05-06 LAB — COMPREHENSIVE METABOLIC PANEL
ALT: 23 U/L (ref 0–44)
AST: 23 U/L (ref 15–41)
Albumin: 4.6 g/dL (ref 3.5–5.0)
Alkaline Phosphatase: 52 U/L (ref 38–126)
Anion gap: 7 (ref 5–15)
BUN: 21 mg/dL (ref 8–23)
CO2: 27 mmol/L (ref 22–32)
Calcium: 10.1 mg/dL (ref 8.9–10.3)
Chloride: 105 mmol/L (ref 98–111)
Creatinine, Ser: 1.11 mg/dL (ref 0.61–1.24)
GFR, Estimated: >60 mL/min (ref >60)
Glucose, Bld: 109 mg/dL — ABNORMAL HIGH (ref 70–99)
Potassium: 3.6 mmol/L (ref 3.5–5.1)
Sodium: 139 mmol/L (ref 135–145)
Total Bilirubin: 0.5 mg/dL (ref 0.3–1.2)
Total Protein: 7.4 g/dL (ref 6.5–8.1)

## 2022-05-06 LAB — GLUCOSE, CAPILLARY: Glucose-Capillary: 115 mg/dL — ABNORMAL HIGH (ref 70–99)

## 2022-05-06 LAB — TYPE AND SCREEN
ABO/RH(D): O POS
Antibody Screen: NEGATIVE

## 2022-05-06 LAB — SURGICAL PCR SCREEN
MRSA, PCR: NEGATIVE
Staphylococcus aureus: NEGATIVE

## 2022-05-06 LAB — HEMOGLOBIN A1C
Hgb A1c MFr Bld: 6.7 % — ABNORMAL HIGH (ref 4.8–5.6)
Mean Plasma Glucose: 145.59 mg/dL

## 2022-05-06 LAB — PROTIME-INR
INR: 1.1 (ref 0.8–1.2)
Prothrombin Time: 14.2 seconds (ref 11.4–15.2)

## 2022-05-06 LAB — APTT: aPTT: 36 seconds (ref 24–36)

## 2022-05-06 NOTE — Progress Notes (Signed)
PCP - Dimas Chyle, MD Cardiologist - Dr Debara Pickett- pt states he was sent for cardiac clearance as well as stress test prior to surgery.    PPM/ICD - denies  Chest x-ray - DOS EKG - 05/06/2022  Stress Test - 04/16/22 ECHO - denies Cardiac Cath - denies  Sleep Study - pt reports he was diagnosed with sleep apnea a while ago but does not wear a CPAP   Fasting Blood Sugar - CBG 115 today at PAT. Pt states that he does not have a way to check his blood sugar at home and needs to get a new glucometer. Pt encouraged to contact PCP if needed regarding checking blood sugars and getting supplies. Hgb A1c drawn today at PAT.   Aspirin Instructions: Continue ASA, hold ASA DOS per Levonne Spiller, RN  ERAS Protcol - NPO order   COVID TEST- 05/06/2022   Anesthesia review: pt has cardiac clearance note in epic, was sent to have Stress test for clearance due to abnormal CT.   Patient denies shortness of breath, fever, cough and chest pain at PAT appointment   All instructions explained to the patient, with a verbal understanding of the material. Patient agrees to go over the instructions while at home for a better understanding. Patient also instructed to self quarantine after being tested for COVID-19. The opportunity to ask questions was provided.

## 2022-05-06 NOTE — Pre-Procedure Instructions (Signed)
Surgical Instructions    Your procedure is scheduled on Monday 05/10/22.   Report to [facility] Main Entrance "A" at 05:30 A.M., then check in with the Admitting office.  Call this number if you have problems the morning of surgery:  [phone]   If you have any questions prior to your surgery date call [phone]: Open Monday-Friday 8am-4pm    Remember:  Do not eat or drink after midnight the night before your surgery     Take these medicines the morning of surgery with A SIP OF WATER:   fenofibrate (TRICOR)  Follow your surgeon's instructions on when to stop Aspirin.  If no instructions were given by your surgeon then you will need to call the office to get those instructions.      As of today, STOP taking any Aleve, Naproxen, Ibuprofen, Motrin, Advil, Goody's, BC's, all herbal medications, fish oil, and all vitamins.  WHAT DO I DO ABOUT MY DIABETES MEDICATION?   Do not take oral diabetes medicines (pills) the morning of surgery.  DO NOT TAKE empagliflozin (JARDIANCE) three days prior to surgery. Last dose should be taken on 05/06/22. DO NOT TAKE JARDIANCE 9/22, 9/23, and 9/24.   DO NOT TAKE metFORMIN (GLUCOPHAGE) the morning of surgery.   The day of surgery, do not take other diabetes injectables, including Byetta (exenatide), Bydureon (exenatide ER), Victoza (liraglutide), or Trulicity (dulaglutide).  HOW TO MANAGE YOUR DIABETES BEFORE AND AFTER SURGERY  Why is it important to control my blood sugar before and after surgery? Improving blood sugar levels before and after surgery helps healing and can limit problems. A way of improving blood sugar control is eating a healthy diet by:  Eating less sugar and carbohydrates  Increasing activity/exercise  Talking with your doctor about reaching your blood sugar goals High blood sugars (greater than 180 mg/dL) can raise your risk of infections and slow your recovery, so you will need to focus on controlling your diabetes  during the weeks before surgery. Make sure that the doctor who takes care of your diabetes knows about your planned surgery including the date and location.  How do I manage my blood sugar before surgery? Check your blood sugar at least 4 times a day, starting 2 days before surgery, to make sure that the level is not too high or low.  Check your blood sugar the morning of your surgery when you wake up and every 2 hours until you get to the Short Stay unit.  If your blood sugar is less than 70 mg/dL, you will need to treat for low blood sugar: Do not take insulin. Treat a low blood sugar (less than 70 mg/dL) with  cup of clear juice (cranberry or apple), 4 glucose tablets, OR glucose gel. Recheck blood sugar in 15 minutes after treatment (to make sure it is greater than 70 mg/dL). If your blood sugar is not greater than 70 mg/dL on recheck, call [phone] for further instructions. Report your blood sugar to the short stay nurse when you get to Short Stay.  If you are admitted to the hospital after surgery: Your blood sugar will be checked by the staff and you will probably be given insulin after surgery (instead of oral diabetes medicines) to make sure you have good blood sugar levels. The goal for blood sugar control after surgery is 80-180 mg/dL.             [facility] is not responsible for any belongings or valuables.  Do NOT Smoke (Tobacco/Vaping)  24 hours prior to your procedure  If you use a CPAP at night, you may bring your mask for your overnight stay.   Contacts, glasses, hearing aids, dentures or partials may not be worn into surgery, please bring cases for these belongings   For patients admitted to the hospital, discharge time will be determined by your treatment team.   Patients discharged the day of surgery will not be allowed to drive home, and someone needs to stay with them for 24 hours.   SURGICAL WAITING ROOM VISITATION Patients having surgery or a  procedure may have no more than 2 support people in the waiting area - these visitors may rotate.   Children under the age of 29 must have an adult with them who is not the patient. If the patient needs to stay at the hospital during part of their recovery, the visitor guidelines for inpatient rooms apply. Pre-op nurse will coordinate an appropriate time for 1 support person to accompany patient in pre-op.  This support person may not rotate.   Please refer to the [facility] website for the visitor guidelines for Inpatients (after your surgery is over and you are in a regular room).    Special instructions:    Oral Hygiene is also important to reduce your risk of infection.  Remember - BRUSH YOUR TEETH THE MORNING OF SURGERY WITH YOUR REGULAR TOOTHPASTE   [place]- Preparing For Surgery  Before surgery, you can play an important role. Because skin is not sterile, your skin needs to be as free of germs as possible. You can reduce the number of germs on your skin by washing with CHG (chlorahexidine gluconate) Soap before surgery.  CHG is an antiseptic cleaner which kills germs and bonds with the skin to continue killing germs even after washing.     Please do not use if you have an allergy to CHG or antibacterial soaps. If your skin becomes reddened/irritated stop using the CHG.  Do not shave (including legs and underarms) for at least 48 hours prior to first CHG shower. It is OK to shave your face.  Please follow these instructions carefully.     Shower the NIGHT BEFORE SURGERY and the MORNING OF SURGERY with CHG Soap.   If you chose to wash your hair, wash your hair first as usual with your normal shampoo. After you shampoo, rinse your hair and body thoroughly to remove the shampoo.  Then [employer] and genitals (private parts) with your normal soap and rinse thoroughly to remove soap.  After that Use CHG Soap as you would any other liquid soap. You can apply CHG directly to the skin and  wash gently with a scrungie or a clean washcloth.   Apply the CHG Soap to your body ONLY FROM THE NECK DOWN.  Do not use on open wounds or open sores. Avoid contact with your eyes, ears, mouth and genitals (private parts). Wash Face and genitals (private parts)  with your normal soap.   Wash thoroughly, paying special attention to the area where your surgery will be performed.  Thoroughly rinse your body with warm water from the neck down.  DO NOT shower/wash with your normal soap after using and rinsing off the CHG Soap.  Pat yourself dry with a CLEAN TOWEL.  Wear CLEAN PAJAMAS to bed the night before surgery  Place CLEAN SHEETS on your bed the night before your surgery  DO NOT SLEEP WITH PETS.  Day of Surgery:  Take a shower with CHG soap. Wear Clean/Comfortable clothing the morning of surgery Do not wear jewelry or makeup. Do not wear lotions, powders, perfumes/cologne or deodorant. Do not shave 48 hours prior to surgery.  Men may shave face and neck. Do not bring valuables to the hospital. Do not wear nail polish, gel polish, artificial nails, or any other type of covering on natural nails (fingers and toes) If you have artificial nails or gel coating that need to be removed by a nail salon, please have this removed prior to surgery. Artificial nails or gel coating may interfere with anesthesia's ability to adequately monitor your vital signs. Remember to brush your teeth WITH YOUR REGULAR TOOTHPASTE.    If you received a COVID test during your pre-op visit, it is requested that you wear a mask when out in public, stay away from anyone that may not be feeling well, and notify your surgeon if you develop symptoms. If you have been in contact with anyone that has tested positive in the last 10 days, please notify your surgeon.    Please read over the following fact sheets that you were given.

## 2022-05-07 LAB — SARS CORONAVIRUS 2 (TAT 6-24 HRS): SARS Coronavirus 2: NEGATIVE

## 2022-05-07 NOTE — Progress Notes (Signed)
Anesthesia Chart Review:  Case: [id number] Date/Time: 05/10/22 0815   Procedure: XI ROBOTIC ASSISTED THORACOSCOPY-RIGHT UPPER LOBECTOMY (Right: Chest)   Anesthesia type: General   Pre-op diagnosis: RUL PULMONARY NODULE   Location: [facility] OR ROOM 10 / [place] [facility]   Surgeons: Lajuana Matte, MD       DISCUSSION: Patient is a 66 year old male scheduled for the above procedure by Dr. Kipp Brood. Per Dr. Agustina Caroli 04/28/22 note, "Planning for navigational bronchoscopy for dye marking and biopsy, then robotic VATS resection and probable lobectomy." His RUL lung lesion was found on a recent LCS CT.   History includes smoking, OSA (does not use CPAP), DM2, HLD, prostate cancer (s/p robotic assisted laparoscopic radical retropubic prostatectomy 08/06/10), neuropathy (legs), spinal surgery.   He had recent evaluation by cardiologist Dr. Debara Pickett for dyslipidemia. Also finding of 3V coronary calcifications on 02/26/22 chest CT. He was asymptomatic, but given surgery plans and input from surgeon, decision make for preoperative stress test which was done on 04/15/22. Per cardiologist Dr. Debara Pickett, "Low risk myoview - suspect inferior attenuation artifact. Ok to proceed with surgery."         Anesthesia team to evaluate on the day of surgery.  VS: BP 115/66   Pulse 67   Temp 36.7 C (Oral)   Resp 16   Ht _0  (1.727 m)   Wt 83.3 kg   SpO2 98%   BMI 27.93 kg/m    PROVIDERS: Vivi Barrack, MD is PCP  Debara Pickett Nadean Corwin, MD is cardiologist Baltazar Apo, MD is pulmonologist   LABS: Labs reviewed: Acceptable for surgery. (all labs ordered are listed, but only abnormal results are displayed)  Labs Reviewed  GLUCOSE, CAPILLARY - Abnormal; Notable for the following components:      Result Value   Glucose-Capillary 115 (*)    All other components within normal limits  COMPREHENSIVE METABOLIC PANEL - Abnormal; Notable for the following components:   Glucose, Bld 109 (*)    All other components within normal  limits  URINALYSIS, ROUTINE W REFLEX MICROSCOPIC - Abnormal; Notable for the following components:   Glucose, UA >=500 (*)    All other components within normal limits  HEMOGLOBIN A1C - Abnormal; Notable for the following components:   Hgb A1c MFr Bld 6.7 (*)    All other components within normal limits  SURGICAL PCR SCREEN  SARS CORONAVIRUS 2 (TAT 6-24 HRS)  CBC  PROTIME-INR  APTT  TYPE AND SCREEN    PFTs 03/26/22: FVC: 3.21 (76%), post 3.01 (71%) FEV1: 2.37 (75%), post 2.35 (75%) DLCO unc 16.04 (64%), cor 15.95 (64%)   IMAGES: CT Super D Chest 05/04/22: IMPRESSION: 1. There has been mild increase in size of suspicious, FDG avid right upper lobe lung nodule. 2. No new sites of disease. 3. Coronary artery calcifications. 4. Aortic Atherosclerosis (ICD10-I70.0) and Emphysema (ICD10-J43.9).   PET Scan 03/10/22: IMPRESSION: 1. Hypermetabolic 11 mm right upper lobe pulmonary nodule is highly suspicious for primary bronchogenic neoplasm. 2. No hypermetabolic thoracic adenopathy. 3. No evidence of hypermetabolic distant metastatic disease. 4.  Aortic Atherosclerosis (ICD10-I70.0).    EKG: 05/06/22: Normal sinus rhythm Non-specific intra-ventricular conduction delay Borderline ECG When compared with ECG of 28-Jul-2010 10:17, PREVIOUS ECG IS PRESENT Confirmed by Loralie Champagne [phone]) on 05/06/2022 11:42:50 PM   CV: Nuclear stress test 04/15/22:   Carlton Adam stress shows no EKG changes to suggest ischemia   Thinning with decreased tracer activity in the inferoseptal wall (base, mid, distal) and inferior wall (distal)  Otherwise normal perfusion.  No change in the recovery images .  Overall consistent with probable soft tissue attenuation (diaphragm, bowel activity)   No ischemia.  Cannot exclude concomitant subendocardial scar   Left ventricular function is abnormal. Global function is mildly reduced at 49% with inferior hypokinesis   Consider echocardiogram to further define  regional wall motion and LV systolic function   Overall intermediate risk scan due to LVEF.   Past Medical History:  Diagnosis Date   Cancer ([place]) 4 years ago   Prostate Cancer    Diabetes mellitus without complication ([place])    type 2   Hyperlipidemia    Neuropathy    in legs causing balance issues per patient   Sleep apnea    Smoker     Past Surgical History:  Procedure Laterality Date   BACK SURGERY  15 years ago   COLONOSCOPY  2007 (at age 38)   pt does not know MD name/normal exam per pt.   PROSTATECTOMY  4 years ago    MEDICATIONS:  aspirin 81 MG EC tablet   atorvastatin (LIPITOR) 40 MG tablet   Blood Glucose Monitoring Suppl (TRUE METRIX METER) w/Device KIT   empagliflozin (JARDIANCE) 10 MG TABS tablet   fenofibrate (TRICOR) 145 MG tablet   glucose blood (ONETOUCH VERIO) test strip   ibuprofen (ADVIL) 200 MG tablet   losartan (COZAAR) 25 MG tablet   metFORMIN (GLUCOPHAGE) 500 MG tablet   ONETOUCH DELICA LANCETS 92J MISC   No current facility-administered medications for this encounter.    Myra Gianotti, PA-C Surgical Short Stay/Anesthesiology [facility] Phone [phone] [facility] Phone [phone] 05/07/2022 1:36 PM

## 2022-05-07 NOTE — Anesthesia Preprocedure Evaluation (Signed)
Anesthesia Evaluation  Patient identified by MRN, date of birth, ID band Patient awake    Reviewed: Allergy & Precautions, NPO status , Patient's Chart, lab work & pertinent test results  Airway Mallampati: II  TM Distance: >3 FB Neck ROM: Full    Dental  (+) Dental Advisory Given, Missing   Pulmonary sleep apnea , Current Smoker and Patient abstained from smoking.,  RUL nodule    Pulmonary exam normal breath sounds clear to auscultation       Cardiovascular hypertension, Pt. on medications + CAD  Normal cardiovascular exam Rhythm:Regular Rate:Normal  Nuclear stress test 04/15/22: . Lexiscan stress shows no EKG changes to suggest ischemia . Thinning with decreased tracer activity in the inferoseptal wall (base, mid, distal) and inferior wall (distal) Otherwise normal perfusion. No change in the recovery images . Overall consistent with probable soft tissue attenuation (diaphragm, bowel activity) No ischemia. Cannot exclude concomitant subendocardial scar . Left ventricular function is abnormal. Global function is mildly reduced at 49% with inferior hypokinesis Consider echocardiogram to further define regional wall motion and LV systolic function . Overall intermediate risk scan due to LVEF.    Neuro/Psych  Neuromuscular disease    GI/Hepatic negative GI ROS, Neg liver ROS,   Endo/Other  diabetes, Type 2, Oral Hypoglycemic Agents  Renal/GU negative Renal ROS     Musculoskeletal negative musculoskeletal ROS (+)   Abdominal   Peds  Hematology negative hematology ROS (+)   Anesthesia Other Findings   Reproductive/Obstetrics                           Anesthesia Physical Anesthesia Plan  ASA: 3  Anesthesia Plan: General   Post-op Pain Management: Tylenol PO (pre-op)*   Induction: Intravenous  PONV Risk Score and Plan: 2 and Midazolam, Dexamethasone and Ondansetron  Airway  Management Planned: Oral ETT and Double Lumen EBT  Additional Equipment: ClearSight  Intra-op Plan:   Post-operative Plan: Extubation in OR  Informed Consent: I have reviewed the patients History and Physical, chart, labs and discussed the procedure including the risks, benefits and alternatives for the proposed anesthesia with the patient or authorized representative who has indicated his/her understanding and acceptance.     Dental advisory given  Plan Discussed with:   Anesthesia Plan Comments: (2nd PIV  PAT note written 05/07/2022 by Myra Gianotti, PA-C. Ok to proceed from cardiac standpoint per Dr. Debara Pickett.)     Anesthesia Quick Evaluation

## 2022-05-10 ENCOUNTER — Ambulatory Visit (HOSPITAL_COMMUNITY): Payer: PPO

## 2022-05-10 ENCOUNTER — Other Ambulatory Visit: Payer: Self-pay

## 2022-05-10 ENCOUNTER — Encounter (HOSPITAL_COMMUNITY)
Admission: AD | Disposition: A | Discharge status: Home / Self Care | Payer: Self-pay | Source: Home / Self Care | Attending: Thoracic Surgery (Cardiothoracic Vascular Surgery)

## 2022-05-10 ENCOUNTER — Inpatient Hospital Stay (HOSPITAL_COMMUNITY)
Admission: AD | Admit: 2022-05-10 | Discharge: 2022-05-17 | DRG: 164 | Disposition: A | Discharge status: Home / Self Care | Payer: PPO | Attending: Thoracic Surgery (Cardiothoracic Vascular Surgery) | Admitting: Thoracic Surgery (Cardiothoracic Vascular Surgery)

## 2022-05-10 ENCOUNTER — Ambulatory Visit (HOSPITAL_COMMUNITY): Payer: PPO | Admitting: Vascular Surgery

## 2022-05-10 ENCOUNTER — Ambulatory Visit (HOSPITAL_COMMUNITY): Payer: PPO | Admitting: Certified Registered Nurse Anesthetist

## 2022-05-10 ENCOUNTER — Inpatient Hospital Stay (HOSPITAL_COMMUNITY): Payer: PPO

## 2022-05-10 ENCOUNTER — Encounter (HOSPITAL_COMMUNITY): Payer: Self-pay | Admitting: Emergency Medicine

## 2022-05-10 DIAGNOSIS — Z86010 Personal history of colonic polyps: Secondary | ICD-10-CM | POA: Diagnosis not present

## 2022-05-10 DIAGNOSIS — Z7982 Long term (current) use of aspirin: Secondary | ICD-10-CM

## 2022-05-10 DIAGNOSIS — J95811 Postprocedural pneumothorax: Secondary | ICD-10-CM | POA: Diagnosis not present

## 2022-05-10 DIAGNOSIS — D62 Acute posthemorrhagic anemia: Secondary | ICD-10-CM | POA: Diagnosis not present

## 2022-05-10 DIAGNOSIS — F17210 Nicotine dependence, cigarettes, uncomplicated: Secondary | ICD-10-CM | POA: Diagnosis present

## 2022-05-10 DIAGNOSIS — D696 Thrombocytopenia, unspecified: Secondary | ICD-10-CM | POA: Diagnosis not present

## 2022-05-10 DIAGNOSIS — Z8249 Family history of ischemic heart disease and other diseases of the circulatory system: Secondary | ICD-10-CM

## 2022-05-10 DIAGNOSIS — Z79899 Other long term (current) drug therapy: Secondary | ICD-10-CM

## 2022-05-10 DIAGNOSIS — Z7984 Long term (current) use of oral hypoglycemic drugs: Secondary | ICD-10-CM | POA: Diagnosis not present

## 2022-05-10 DIAGNOSIS — G4730 Sleep apnea, unspecified: Secondary | ICD-10-CM | POA: Diagnosis present

## 2022-05-10 DIAGNOSIS — E1169 Type 2 diabetes mellitus with other specified complication: Secondary | ICD-10-CM | POA: Diagnosis present

## 2022-05-10 DIAGNOSIS — J95812 Postprocedural air leak: Secondary | ICD-10-CM | POA: Diagnosis not present

## 2022-05-10 DIAGNOSIS — Z833 Family history of diabetes mellitus: Secondary | ICD-10-CM | POA: Diagnosis not present

## 2022-05-10 DIAGNOSIS — Z820 Family history of epilepsy and other diseases of the nervous system: Secondary | ICD-10-CM

## 2022-05-10 DIAGNOSIS — R059 Cough, unspecified: Secondary | ICD-10-CM | POA: Diagnosis not present

## 2022-05-10 DIAGNOSIS — E1142 Type 2 diabetes mellitus with diabetic polyneuropathy: Secondary | ICD-10-CM | POA: Diagnosis present

## 2022-05-10 DIAGNOSIS — Z800 Family history of malignant neoplasm of digestive organs: Secondary | ICD-10-CM | POA: Diagnosis not present

## 2022-05-10 DIAGNOSIS — E785 Hyperlipidemia, unspecified: Secondary | ICD-10-CM | POA: Diagnosis present

## 2022-05-10 DIAGNOSIS — I2510 Atherosclerotic heart disease of native coronary artery without angina pectoris: Secondary | ICD-10-CM

## 2022-05-10 DIAGNOSIS — D72829 Elevated white blood cell count, unspecified: Secondary | ICD-10-CM | POA: Diagnosis not present

## 2022-05-10 DIAGNOSIS — E119 Type 2 diabetes mellitus without complications: Secondary | ICD-10-CM

## 2022-05-10 DIAGNOSIS — R911 Solitary pulmonary nodule: Secondary | ICD-10-CM | POA: Diagnosis present

## 2022-05-10 DIAGNOSIS — Z9079 Acquired absence of other genital organ(s): Secondary | ICD-10-CM

## 2022-05-10 DIAGNOSIS — C3411 Malignant neoplasm of upper lobe, right bronchus or lung: Secondary | ICD-10-CM | POA: Diagnosis present

## 2022-05-10 DIAGNOSIS — Z8546 Personal history of malignant neoplasm of prostate: Secondary | ICD-10-CM

## 2022-05-10 DIAGNOSIS — Z902 Acquired absence of lung [part of]: Principal | ICD-10-CM

## 2022-05-10 DIAGNOSIS — I454 Nonspecific intraventricular block: Secondary | ICD-10-CM | POA: Diagnosis not present

## 2022-05-10 DIAGNOSIS — L570 Actinic keratosis: Secondary | ICD-10-CM | POA: Diagnosis present

## 2022-05-10 DIAGNOSIS — I10 Essential (primary) hypertension: Secondary | ICD-10-CM

## 2022-05-10 HISTORY — PX: NODE DISSECTION: SHX5269

## 2022-05-10 HISTORY — PX: INTERCOSTAL NERVE BLOCK: SHX5021

## 2022-05-10 HISTORY — PX: BRONCHIAL BRUSHINGS: SHX5108

## 2022-05-10 HISTORY — PX: FIDUCIAL MARKER PLACEMENT: SHX6858

## 2022-05-10 HISTORY — PX: BRONCHIAL BIOPSY: SHX5109

## 2022-05-10 HISTORY — PX: BRONCHIAL NEEDLE ASPIRATION BIOPSY: SHX5106

## 2022-05-10 LAB — GLUCOSE, CAPILLARY
Glucose-Capillary: 157 mg/dL — ABNORMAL HIGH (ref 70–99)
Glucose-Capillary: 154 mg/dL — ABNORMAL HIGH (ref 70–99)
Glucose-Capillary: 154 mg/dL — ABNORMAL HIGH (ref 70–99)
Glucose-Capillary: 154 mg/dL — ABNORMAL HIGH (ref 70–99)
Glucose-Capillary: 191 mg/dL — ABNORMAL HIGH (ref 70–99)
Glucose-Capillary: 174 mg/dL — ABNORMAL HIGH (ref 70–99)

## 2022-05-10 SURGERY — BRONCHOSCOPY, WITH BIOPSY USING ELECTROMAGNETIC NAVIGATION
Anesthesia: General

## 2022-05-10 SURGERY — LOBECTOMY, LUNG, ROBOT-ASSISTED, USING VATS
Anesthesia: General | Site: Chest | Laterality: Right

## 2022-05-10 MED ORDER — LIDOCAINE 2% (20 MG/ML) 5 ML SYRINGE
INTRAMUSCULAR | Status: AC
  Filled 2022-05-10: qty 5

## 2022-05-10 MED ORDER — PROPOFOL 10 MG/ML IV BOLUS
INTRAVENOUS | Status: AC
  Filled 2022-05-10: qty 20

## 2022-05-10 MED ORDER — ATORVASTATIN CALCIUM 40 MG PO TABS
40.0000 mg | ORAL_TABLET | Freq: Every day | ORAL | Status: DC
  Administered 2022-05-11 – 2022-05-16 (×6): 40 mg via ORAL
  Filled 2022-05-10 (×6): qty 1

## 2022-05-10 MED ORDER — PHENYLEPHRINE HCL-NACL 20-0.9 MG/250ML-% IV SOLN
INTRAVENOUS | Status: DC | PRN
  Administered 2022-05-10: 50 ug/min via INTRAVENOUS

## 2022-05-10 MED ORDER — CEFAZOLIN SODIUM-DEXTROSE 2-4 GM/100ML-% IV SOLN
2.0000 g | INTRAVENOUS | Status: AC
  Administered 2022-05-10: 2 g via INTRAVENOUS
  Filled 2022-05-10 (×2): qty 100

## 2022-05-10 MED ORDER — ONDANSETRON HCL 4 MG/2ML IJ SOLN: 4.0000 mg | Freq: Once | INTRAMUSCULAR | Status: DC | PRN

## 2022-05-10 MED ORDER — PHENYLEPHRINE 80 MCG/ML (10ML) SYRINGE FOR IV PUSH (FOR BLOOD PRESSURE SUPPORT)
PREFILLED_SYRINGE | INTRAVENOUS | Status: DC | PRN
  Administered 2022-05-10: 80 ug via INTRAVENOUS
  Administered 2022-05-10: 160 ug via INTRAVENOUS
  Administered 2022-05-10 (×5): 80 ug via INTRAVENOUS
  Administered 2022-05-10: 160 ug via INTRAVENOUS

## 2022-05-10 MED ORDER — EPHEDRINE SULFATE-NACL 50-0.9 MG/10ML-% IV SOSY
PREFILLED_SYRINGE | INTRAVENOUS | Status: DC | PRN
  Administered 2022-05-10: 5 mg via INTRAVENOUS

## 2022-05-10 MED ORDER — ONDANSETRON HCL 4 MG/2ML IJ SOLN: 4.0000 mg | Freq: Four times a day (QID) | INTRAMUSCULAR | Status: DC | PRN

## 2022-05-10 MED ORDER — LACTATED RINGERS IV SOLN: INTRAVENOUS | Status: DC | PRN

## 2022-05-10 MED ORDER — MIDAZOLAM HCL 2 MG/2ML IJ SOLN
INTRAMUSCULAR | Status: AC
  Filled 2022-05-10: qty 2

## 2022-05-10 MED ORDER — BISACODYL 5 MG PO TBEC
10.0000 mg | DELAYED_RELEASE_TABLET | Freq: Every day | ORAL | Status: DC
  Administered 2022-05-12 – 2022-05-16 (×5): 10 mg via ORAL
  Filled 2022-05-10 (×6): qty 2

## 2022-05-10 MED ORDER — ROCURONIUM BROMIDE 10 MG/ML (PF) SYRINGE
PREFILLED_SYRINGE | INTRAVENOUS | Status: AC
  Filled 2022-05-10: qty 10

## 2022-05-10 MED ORDER — ACETAMINOPHEN 160 MG/5ML PO SOLN: 1000.0000 mg | Freq: Four times a day (QID) | ORAL | Status: AC

## 2022-05-10 MED ORDER — DEXAMETHASONE SODIUM PHOSPHATE 10 MG/ML IJ SOLN
INTRAMUSCULAR | Status: AC
  Filled 2022-05-10: qty 1

## 2022-05-10 MED ORDER — ORAL CARE MOUTH RINSE: 15.0000 mL | Freq: Once | OROMUCOSAL | Status: AC

## 2022-05-10 MED ORDER — KETOROLAC TROMETHAMINE 15 MG/ML IJ SOLN
15.0000 mg | Freq: Four times a day (QID) | INTRAMUSCULAR | Status: AC
  Administered 2022-05-10 – 2022-05-12 (×8): 15 mg via INTRAVENOUS
  Filled 2022-05-10 (×8): qty 1

## 2022-05-10 MED ORDER — FENTANYL CITRATE (PF) 250 MCG/5ML IJ SOLN
INTRAMUSCULAR | Status: DC | PRN
  Administered 2022-05-10: 100 ug via INTRAVENOUS
  Administered 2022-05-10: 50 ug via INTRAVENOUS
  Administered 2022-05-10: 100 ug via INTRAVENOUS
  Administered 2022-05-10: 50 ug via INTRAVENOUS

## 2022-05-10 MED ORDER — ONDANSETRON HCL 4 MG/2ML IJ SOLN
INTRAMUSCULAR | Status: DC | PRN
  Administered 2022-05-10: 4 mg via INTRAVENOUS

## 2022-05-10 MED ORDER — PROPOFOL 10 MG/ML IV BOLUS
INTRAVENOUS | Status: DC | PRN
  Administered 2022-05-10: 200 mg via INTRAVENOUS
  Administered 2022-05-10: 70 mg via INTRAVENOUS

## 2022-05-10 MED ORDER — PANTOPRAZOLE SODIUM 40 MG PO TBEC
40.0000 mg | DELAYED_RELEASE_TABLET | Freq: Every day | ORAL | Status: DC
  Administered 2022-05-11 – 2022-05-17 (×7): 40 mg via ORAL
  Filled 2022-05-10 (×7): qty 1

## 2022-05-10 MED ORDER — INSULIN ASPART 100 UNIT/ML IJ SOLN
0.0000 [IU] | INTRAMUSCULAR | Status: AC | PRN
  Administered 2022-05-10 (×2): 2 [IU] via SUBCUTANEOUS
  Filled 2022-05-10: qty 1

## 2022-05-10 MED ORDER — PROPOFOL 500 MG/50ML IV EMUL
INTRAVENOUS | Status: DC | PRN
  Administered 2022-05-10: 150 ug/kg/min via INTRAVENOUS

## 2022-05-10 MED ORDER — CHLORHEXIDINE GLUCONATE 0.12 % MT SOLN
15.0000 mL | Freq: Once | OROMUCOSAL | Status: AC
  Administered 2022-05-10: 15 mL via OROMUCOSAL
  Filled 2022-05-10 (×2): qty 15

## 2022-05-10 MED ORDER — SODIUM CHLORIDE FLUSH 0.9 % IV SOLN: INTRAVENOUS | Status: DC | PRN

## 2022-05-10 MED ORDER — FENTANYL CITRATE (PF) 100 MCG/2ML IJ SOLN: 25.0000 ug | INTRAMUSCULAR | Status: DC | PRN

## 2022-05-10 MED ORDER — LIDOCAINE 2% (20 MG/ML) 5 ML SYRINGE
INTRAMUSCULAR | Status: DC | PRN
  Administered 2022-05-10: 60 mg via INTRAVENOUS

## 2022-05-10 MED ORDER — CEFAZOLIN SODIUM-DEXTROSE 2-4 GM/100ML-% IV SOLN
2.0000 g | Freq: Three times a day (TID) | INTRAVENOUS | Status: AC
  Administered 2022-05-10 (×2): 2 g via INTRAVENOUS
  Filled 2022-05-10 (×2): qty 100

## 2022-05-10 MED ORDER — SENNOSIDES-DOCUSATE SODIUM 8.6-50 MG PO TABS
1.0000 | ORAL_TABLET | Freq: Every day | ORAL | Status: DC
  Administered 2022-05-10 – 2022-05-16 (×6): 1 via ORAL
  Filled 2022-05-10 (×6): qty 1

## 2022-05-10 MED ORDER — LOSARTAN POTASSIUM 25 MG PO TABS
25.0000 mg | ORAL_TABLET | Freq: Every day | ORAL | Status: DC
  Administered 2022-05-11 – 2022-05-17 (×7): 25 mg via ORAL
  Filled 2022-05-10 (×7): qty 1

## 2022-05-10 MED ORDER — ALBUMIN HUMAN 5 % IV SOLN: INTRAVENOUS | Status: DC | PRN

## 2022-05-10 MED ORDER — ENOXAPARIN SODIUM 40 MG/0.4ML IJ SOSY
40.0000 mg | PREFILLED_SYRINGE | Freq: Every day | INTRAMUSCULAR | Status: DC
  Administered 2022-05-11 – 2022-05-16 (×7): 40 mg via SUBCUTANEOUS
  Filled 2022-05-10 (×7): qty 0.4

## 2022-05-10 MED ORDER — ROCURONIUM BROMIDE 10 MG/ML (PF) SYRINGE
PREFILLED_SYRINGE | INTRAVENOUS | Status: DC | PRN
  Administered 2022-05-10: 60 mg via INTRAVENOUS
  Administered 2022-05-10: 40 mg via INTRAVENOUS
  Administered 2022-05-10: 30 mg via INTRAVENOUS
  Administered 2022-05-10: 10 mg via INTRAVENOUS

## 2022-05-10 MED ORDER — ACETAMINOPHEN 500 MG PO TABS
1000.0000 mg | ORAL_TABLET | Freq: Four times a day (QID) | ORAL | Status: AC
  Administered 2022-05-10 – 2022-05-15 (×19): 1000 mg via ORAL
  Filled 2022-05-10 (×20): qty 2

## 2022-05-10 MED ORDER — INSULIN ASPART 100 UNIT/ML IJ SOLN
0.0000 [IU] | INTRAMUSCULAR | Status: DC
  Administered 2022-05-10: 4 [IU] via SUBCUTANEOUS
  Administered 2022-05-11 (×2): 2 [IU] via SUBCUTANEOUS

## 2022-05-10 MED ORDER — FENTANYL CITRATE (PF) 250 MCG/5ML IJ SOLN
INTRAMUSCULAR | Status: AC
  Filled 2022-05-10: qty 5

## 2022-05-10 MED ORDER — LACTATED RINGERS IV SOLN: INTRAVENOUS | Status: DC

## 2022-05-10 MED ORDER — 0.9 % SODIUM CHLORIDE (POUR BTL) OPTIME
TOPICAL | Status: DC | PRN
  Administered 2022-05-10: 2000 mL

## 2022-05-10 MED ORDER — METHYLENE BLUE 1 % INJ SOLN
INTRAVENOUS | Status: DC | PRN
  Administered 2022-05-10: 1 mL

## 2022-05-10 MED ORDER — PROPOFOL 1000 MG/100ML IV EMUL
INTRAVENOUS | Status: AC
  Filled 2022-05-10: qty 100

## 2022-05-10 MED ORDER — SUCCINYLCHOLINE CHLORIDE 200 MG/10ML IV SOSY
PREFILLED_SYRINGE | INTRAVENOUS | Status: AC
  Filled 2022-05-10: qty 10

## 2022-05-10 MED ORDER — ACETAMINOPHEN 500 MG PO TABS
1000.0000 mg | ORAL_TABLET | Freq: Once | ORAL | Status: AC
  Administered 2022-05-10: 1000 mg via ORAL
  Filled 2022-05-10: qty 2

## 2022-05-10 MED ORDER — HEMOSTATIC AGENTS (NO CHARGE) OPTIME
TOPICAL | Status: DC | PRN
  Administered 2022-05-10: 2 via TOPICAL

## 2022-05-10 MED ORDER — DEXAMETHASONE SODIUM PHOSPHATE 10 MG/ML IJ SOLN
INTRAMUSCULAR | Status: DC | PRN
  Administered 2022-05-10: 10 mg via INTRAVENOUS

## 2022-05-10 MED ORDER — ASPIRIN 81 MG PO TBEC
81.0000 mg | DELAYED_RELEASE_TABLET | Freq: Every morning | ORAL | Status: DC
  Administered 2022-05-11: 81 mg via ORAL
  Filled 2022-05-10: qty 1

## 2022-05-10 MED ORDER — ONDANSETRON HCL 4 MG/2ML IJ SOLN
INTRAMUSCULAR | Status: AC
  Filled 2022-05-10: qty 2

## 2022-05-10 MED ORDER — TRAMADOL HCL 50 MG PO TABS
50.0000 mg | ORAL_TABLET | Freq: Four times a day (QID) | ORAL | Status: DC | PRN
  Administered 2022-05-11 – 2022-05-14 (×8): 100 mg via ORAL
  Administered 2022-05-15: 50 mg via ORAL
  Administered 2022-05-15 – 2022-05-17 (×4): 100 mg via ORAL
  Filled 2022-05-10 (×13): qty 2

## 2022-05-10 MED ORDER — ROCURONIUM BROMIDE 10 MG/ML (PF) SYRINGE
PREFILLED_SYRINGE | INTRAVENOUS | Status: AC
  Filled 2022-05-10: qty 20

## 2022-05-10 MED ORDER — MORPHINE SULFATE (PF) 2 MG/ML IV SOLN
2.0000 mg | INTRAVENOUS | Status: DC | PRN
  Administered 2022-05-16: 2 mg via INTRAVENOUS
  Filled 2022-05-10: qty 1

## 2022-05-10 MED ORDER — BUPIVACAINE HCL (PF) 0.5 % IJ SOLN
INTRAMUSCULAR | Status: AC
  Filled 2022-05-10: qty 30

## 2022-05-10 MED ORDER — MIDAZOLAM HCL 2 MG/2ML IJ SOLN
INTRAMUSCULAR | Status: DC | PRN
  Administered 2022-05-10: 2 mg via INTRAVENOUS

## 2022-05-10 MED ORDER — BUPIVACAINE LIPOSOME 1.3 % IJ SUSP
INTRAMUSCULAR | Status: AC
  Filled 2022-05-10: qty 20

## 2022-05-10 MED ORDER — SUGAMMADEX SODIUM 200 MG/2ML IV SOLN
INTRAVENOUS | Status: DC | PRN
  Administered 2022-05-10 (×2): 100 mg via INTRAVENOUS

## 2022-05-10 SURGICAL SUPPLY — 122 items
ADH SKN CLS APL DERMABOND .7 (GAUZE/BANDAGES/DRESSINGS) ×1
APL PRP STRL LF DISP 70% ISPRP (MISCELLANEOUS) ×1
APL SWBSTK 6 STRL LF DISP (MISCELLANEOUS) ×1
APPLICATOR COTTON TIP 6 STRL (MISCELLANEOUS) IMPLANT
APPLICATOR COTTON TIP 6IN STRL (MISCELLANEOUS) ×1 IMPLANT
BAG SPEC RTRVL C1550 15 (MISCELLANEOUS) ×1
BLADE CLIPPER SURG (BLADE) ×1 IMPLANT
BLADE SURG 11 STRL SS (BLADE) ×1 IMPLANT
CANISTER SUCT 3000ML PPV (MISCELLANEOUS) ×2 IMPLANT
CATH ROBINSON RED A/P 14FR (CATHETERS) IMPLANT
CATH THORACIC 28FR (CATHETERS) IMPLANT
CHLORAPREP W/TINT 26 (MISCELLANEOUS) ×1 IMPLANT
CLIP VESOCCLUDE MED 6/CT (CLIP) IMPLANT
CNTNR URN SCR LID CUP LEK RST (MISCELLANEOUS) ×5 IMPLANT
CONN ST 1/4X3/8  BEN (MISCELLANEOUS)
CONN ST 1/4X3/8 BEN (MISCELLANEOUS) IMPLANT
CONT SPEC 4OZ STRL OR WHT (MISCELLANEOUS) ×5
DEFOGGER SCOPE WARMER CLEARIFY (MISCELLANEOUS) ×1 IMPLANT
DERMABOND ADVANCED .7 DNX12 (GAUZE/BANDAGES/DRESSINGS) ×1 IMPLANT
DISSECTOR BLUNT TIP ENDO 5MM (MISCELLANEOUS) IMPLANT
DRAIN CHANNEL 28F RND 3/8 FF (WOUND CARE) IMPLANT
DRAPE ARM DVNC X/XI (DISPOSABLE) ×4 IMPLANT
DRAPE COLUMN DVNC XI (DISPOSABLE) ×1 IMPLANT
DRAPE CV SPLIT W-CLR ANES SCRN (DRAPES) ×1 IMPLANT
DRAPE DA VINCI XI ARM (DISPOSABLE) ×4
DRAPE DA VINCI XI COLUMN (DISPOSABLE) ×1
DRAPE HALF SHEET 40X57 (DRAPES) IMPLANT
DRAPE ORTHO SPLIT 77X108 STRL (DRAPES) ×1
DRAPE SURG ORHT 6 SPLT 77X108 (DRAPES) ×1 IMPLANT
ELECT BLADE 6.5 EXT (BLADE) IMPLANT
ELECT REM PT RETURN 9FT ADLT (ELECTROSURGICAL) ×1
ELECTRODE REM PT RTRN 9FT ADLT (ELECTROSURGICAL) ×1 IMPLANT
GAUZE 4X4 16PLY ~~LOC~~+RFID DBL (SPONGE) ×1 IMPLANT
GAUZE KITTNER 4X5 RF (MISCELLANEOUS) IMPLANT
GAUZE KITTNER 4X8 (MISCELLANEOUS) ×1 IMPLANT
GAUZE SPONGE 4X4 12PLY STRL (GAUZE/BANDAGES/DRESSINGS) ×1 IMPLANT
GLOVE BIO SURGEON STRL SZ7.5 (GLOVE) ×2 IMPLANT
GLOVE SURG POLYISO LF SZ8 (GLOVE) ×1 IMPLANT
GOWN STRL REUS W/ TWL LRG LVL3 (GOWN DISPOSABLE) ×2 IMPLANT
GOWN STRL REUS W/ TWL XL LVL3 (GOWN DISPOSABLE) ×3 IMPLANT
GOWN STRL REUS W/TWL 2XL LVL3 (GOWN DISPOSABLE) ×1 IMPLANT
GOWN STRL REUS W/TWL LRG LVL3 (GOWN DISPOSABLE) ×2
GOWN STRL REUS W/TWL XL LVL3 (GOWN DISPOSABLE) ×2
HEMOSTAT SURGICEL 2X14 (HEMOSTASIS) ×3 IMPLANT
IRRIGATION STRYKERFLOW (MISCELLANEOUS) IMPLANT
IRRIGATOR STRYKERFLOW (MISCELLANEOUS)
IRRIGATOR SUCT 8 DISP DVNC XI (IRRIGATION / IRRIGATOR) IMPLANT
IRRIGATOR SUCTION 8MM XI DISP (IRRIGATION / IRRIGATOR) ×1
KIT BASIN OR (CUSTOM PROCEDURE TRAY) ×1 IMPLANT
KIT TURNOVER KIT B (KITS) ×1 IMPLANT
NDL 22X1.5 STRL (OR ONLY) (MISCELLANEOUS) ×1 IMPLANT
NEEDLE 22X1.5 STRL (OR ONLY) (MISCELLANEOUS) ×1 IMPLANT
NS IRRIG 1000ML POUR BTL (IV SOLUTION) ×3 IMPLANT
PACK CHEST (CUSTOM PROCEDURE TRAY) ×1 IMPLANT
PAD ARMBOARD 7.5X6 YLW CONV (MISCELLANEOUS) ×5 IMPLANT
PORT ACCESS TROCAR AIRSEAL 12 (TROCAR) ×1 IMPLANT
PORT ACCESS TROCAR AIRSEAL 5M (TROCAR) ×1
POUCH ENDO CATCH II 15MM (MISCELLANEOUS) IMPLANT
RELOAD STAPLE 45 2.0 GRY DVNC (STAPLE) IMPLANT
RELOAD STAPLE 45 2.5 WHT DVNC (STAPLE) IMPLANT
RELOAD STAPLE 45 3.5 BLU DVNC (STAPLE) IMPLANT
RELOAD STAPLE 45 4.3 GRN DVNC (STAPLE) IMPLANT
RELOAD STAPLE 45 4.6 BLK DVNC (STAPLE) IMPLANT
RELOAD STAPLER 2.5X45 WHT DVNC (STAPLE) ×3 IMPLANT
RELOAD STAPLER 3.5X45 BLU DVNC (STAPLE) ×3 IMPLANT
RELOAD STAPLER 4.3X45 GRN DVNC (STAPLE) ×3 IMPLANT
RELOAD STAPLER 45 4.6 BLK DVNC (STAPLE) ×3 IMPLANT
RETRACTOR WOUND ALXS 19CM XSML (INSTRUMENTS) IMPLANT
RTRCTR WOUND ALEXIS 19CM XSML (INSTRUMENTS)
SCISSORS LAP 5X35 DISP (ENDOMECHANICALS) IMPLANT
SEAL CANN UNIV 5-8 DVNC XI (MISCELLANEOUS) ×2 IMPLANT
SEAL XI 5MM-8MM UNIVERSAL (MISCELLANEOUS) ×2
SEALANT PROGEL (MISCELLANEOUS) IMPLANT
SEALER LIGASURE MARYLAND 30 (ELECTROSURGICAL) IMPLANT
SET TRI-LUMEN FLTR TB AIRSEAL (TUBING) ×1 IMPLANT
SOLUTION ELECTROLUBE (MISCELLANEOUS) IMPLANT
SPONGE INTESTINAL PEANUT (DISPOSABLE) IMPLANT
SPONGE TONSIL TAPE 1 RFD (DISPOSABLE) IMPLANT
STAPLE RELOAD 45 2.0 GRAY (STAPLE) ×1
STAPLE RELOAD 45 2.0 GRAY DVNC (STAPLE) ×1 IMPLANT
STAPLER 45 SUREFORM CVD (STAPLE) ×1
STAPLER 45 SUREFORM CVD DVNC (STAPLE) IMPLANT
STAPLER CANNULA SEAL DVNC XI (STAPLE) ×2 IMPLANT
STAPLER CANNULA SEAL XI (STAPLE) ×2
STAPLER RELOAD 2.5X45 WHITE (STAPLE) ×3
STAPLER RELOAD 2.5X45 WHT DVNC (STAPLE) ×3
STAPLER RELOAD 3.5X45 BLU DVNC (STAPLE) ×3
STAPLER RELOAD 3.5X45 BLUE (STAPLE) ×3
STAPLER RELOAD 4.3X45 GREEN (STAPLE) ×3
STAPLER RELOAD 4.3X45 GRN DVNC (STAPLE) ×3
STAPLER RELOAD 45 4.6 BLK (STAPLE) ×3
STAPLER RELOAD 45 4.6 BLK DVNC (STAPLE) ×3
STOPCOCK 4 WAY LG BORE MALE ST (IV SETS) ×1 IMPLANT
SUT MNCRL AB 3-0 PS2 18 (SUTURE) IMPLANT
SUT MON AB 2-0 CT1 36 (SUTURE) IMPLANT
SUT PDS AB 1 CTX 36 (SUTURE) IMPLANT
SUT PROLENE 4 0 RB 1 (SUTURE)
SUT PROLENE 4-0 RB1 .5 CRCL 36 (SUTURE) IMPLANT
SUT SILK  1 MH (SUTURE) ×1
SUT SILK 1 MH (SUTURE) ×1 IMPLANT
SUT SILK 1 TIES 10X30 (SUTURE) IMPLANT
SUT SILK 2 0 SH (SUTURE) IMPLANT
SUT SILK 2 0SH CR/8 30 (SUTURE) IMPLANT
SUT VIC AB 1 CTX 36 (SUTURE)
SUT VIC AB 1 CTX36XBRD ANBCTR (SUTURE) IMPLANT
SUT VIC AB 2-0 CT1 27 (SUTURE) ×1
SUT VIC AB 2-0 CT1 TAPERPNT 27 (SUTURE) ×1 IMPLANT
SUT VIC AB 3-0 SH 27 (SUTURE) ×2
SUT VIC AB 3-0 SH 27X BRD (SUTURE) ×2 IMPLANT
SUT VICRYL 0 TIES 12 18 (SUTURE) ×1 IMPLANT
SUT VICRYL 0 UR6 27IN ABS (SUTURE) ×2 IMPLANT
SUT VICRYL 2 TP 1 (SUTURE) IMPLANT
SYR 10ML LL (SYRINGE) ×1 IMPLANT
SYR 20ML LL LF (SYRINGE) ×1 IMPLANT
SYR 50ML LL SCALE MARK (SYRINGE) ×1 IMPLANT
SYSTEM RETRIEVAL ANCHOR 15 (MISCELLANEOUS) IMPLANT
SYSTEM SAHARA CHEST DRAIN ATS (WOUND CARE) ×1 IMPLANT
TAPE CLOTH 4X10 WHT NS (GAUZE/BANDAGES/DRESSINGS) ×1 IMPLANT
TIP APPLICATOR SPRAY EXTEND 16 (VASCULAR PRODUCTS) IMPLANT
TOWEL GREEN STERILE (TOWEL DISPOSABLE) ×1 IMPLANT
TRAY FOLEY MTR SLVR 16FR STAT (SET/KITS/TRAYS/PACK) ×1 IMPLANT
TUBING EXTENTION W/L.L. (IV SETS) ×1 IMPLANT

## 2022-05-10 NOTE — Anesthesia Procedure Notes (Signed)
Procedure Name: Intubation Date/Time: 05/10/2022 7:54 AM  Performed by: Janene Harvey, CRNAPre-anesthesia Checklist: Patient identified, Emergency Drugs available, Suction available and Patient being monitored Patient Re-evaluated:Patient Re-evaluated prior to induction Oxygen Delivery Method: Circle system utilized Preoxygenation: Pre-oxygenation with 100% oxygen Induction Type: IV induction Ventilation: Mask ventilation without difficulty and Oral airway inserted - appropriate to patient size Laryngoscope Size: Mac and 4 Grade View: Grade III Tube type: Oral Tube size: 8.5 mm Number of attempts: 1 Airway Equipment and Method: Stylet and Oral airway Placement Confirmation: ETT inserted through vocal cords under direct vision, positive ETCO2 and breath sounds checked- equal and bilateral Secured at: 23 cm Tube secured with: Tape Dental Injury: Teeth and Oropharynx as per pre-operative assessment

## 2022-05-10 NOTE — Anesthesia Procedure Notes (Signed)
Procedure Name: Intubation Date/Time: 05/10/2022 8:53 AM  Performed by: Santa Lighter, MDPre-anesthesia Checklist: Patient identified, Emergency Drugs available, Suction available and Patient being monitored Patient Re-evaluated:Patient Re-evaluated prior to induction Oxygen Delivery Method: Circle system utilized Preoxygenation: Pre-oxygenation with 100% oxygen Induction Type: Inhalational induction with existing ETT and Combination inhalational/ intravenous induction Ventilation: Mask ventilation without difficulty Laryngoscope Size: Glidescope and 3 Tube type: Oral Endobronchial tube: Left, Double lumen EBT and EBT position confirmed by auscultation and 37 Fr Number of attempts: 1 Airway Equipment and Method: Stylet and Oral airway Placement Confirmation: ETT inserted through vocal cords under direct vision, positive ETCO2 and breath sounds checked- equal and bilateral Tube secured with: Tape Dental Injury: Teeth and Oropharynx as per pre-operative assessment  Comments: Elective glidescope - grade III view with MAC 4 by CRNA single lumen ett for bronch

## 2022-05-10 NOTE — H&P (Signed)
[address]       [place] [address]             [phone]                                                   Colbe T Bible Suarez Medical Record #[id number] Date of Birth: [birth year]   Referring: Collene Gobble, MD Primary Care: Vivi Barrack, MD Primary Cardiologist: None   Chief Complaint:        Chief Complaint  Patient presents with   Lung Lesion      Chest CT 7/14, PET 7/26, PFT's 8/11      History of Present Illness:    Gregory Jensen 66 y.o. male presents for surgical evaluation and of a right upper lobe 1.2 cm pulmonary nodule with increased uptake on PET.  He has a significant smoking history, and was a part of her lung cancer screening program.  The nodule was originally identified CT.  He denies any shortness of breath or productive coughs.  He denies any chest pain.  He does have some balance issues and walks with a cane.       Smoking Hx: He continues to smoke.  47 pack year history     Zubrod Score: At the time of surgery this patient's most appropriate activity status/level should be described as: _0     0    Normal activity, no symptoms _1     1    Restricted in physical strenuous activity but ambulatory, able to do out light work _2     2    Ambulatory and capable of self care, unable to do work activities, up and about               >50 % of waking hours                              _3     3    Only limited self care, in bed greater than 50% of waking hours _4     4    Completely disabled, no self care, confined to bed or chair _5     5    Moribund         Past Medical History:  Diagnosis Date   Cancer ([place]) 4 years ago    Prostate Cancer    Diabetes mellitus without complication ([place])     Hyperlipidemia     Sleep apnea     Smoker             Past Surgical History:  Procedure Laterality Date   BACK SURGERY   15 years ago   COLONOSCOPY   2007 (at age 25)    pt does not know MD name/normal exam per pt.    PROSTATECTOMY   4 years ago           Family History  Problem Relation Age of Onset   Alzheimer's disease Mother     Heart disease Father     Hypertension Sister     Heart disease Sister     Hypertension Sister     Stomach cancer Brother     Diabetes Other          family hx  Colon cancer Neg Hx          Social History        Tobacco Use  Smoking Status Every Day   Packs/day: 1.25   Years: 47.00   Total pack years: 58.75   Types: Cigarettes  Smokeless Tobacco Never  Tobacco Comments    1 pack of cigarettes smoked daily ARJ 03/23/22    Social History        Substance and Sexual Activity  Alcohol Use No   Alcohol/week: 0.0 standard drinks of alcohol        No Known Allergies         Current Outpatient Medications  Medication Sig Dispense Refill   aspirin 81 MG EC tablet Take 81 mg by mouth daily.         atorvastatin (LIPITOR) 40 MG tablet Take 1 tablet (40 mg total) by mouth daily. 90 tablet 3   Blood Glucose Monitoring Suppl (TRUE METRIX METER) w/Device KIT USE AS DIRECTED 1 kit 0   fenofibrate (TRICOR) 145 MG tablet Take 1 tablet (145 mg total) by mouth daily. 90 tablet 3   glucose blood (ONETOUCH VERIO) test strip 1 each by Other route daily as needed for other. Use as instructed 100 each 4   metFORMIN (GLUCOPHAGE) 500 MG tablet Take 1 tablet (500 mg total) by mouth 2 (two) times daily with a meal. 90 tablet 1   ONETOUCH DELICA LANCETS 13Y MISC 1 each by Does not apply route daily as needed. 100 each 4    No current facility-administered medications for this visit.      Review of Systems  Constitutional:  Negative for malaise/fatigue and weight loss.  Respiratory:  Negative for cough and shortness of breath.   Cardiovascular:  Positive for chest pain.  Neurological:  Negative for dizziness and headaches.        PHYSICAL EXAMINATION: BP (!) 150/72 (BP Location: Left Arm, Patient Position: Sitting)   Pulse 80   Resp 18   Ht _0  (1.727 m)   Wt 185  lb (83.9 kg)   SpO2 98% Comment: RA  BMI 28.13 kg/m  Physical Exam Constitutional:      General: He is not in acute distress.    Appearance: He is not ill-appearing.  Cardiovascular:     Rate and Rhythm: Normal rate.  Pulmonary:     Effort: Pulmonary effort is normal. No respiratory distress.  Abdominal:     General: Abdomen is flat. There is no distension.  Musculoskeletal:        General: Normal range of motion.     Cervical back: Normal range of motion.  Skin:    General: Skin is warm and dry.  Neurological:     General: No focal deficit present.     Mental Status: He is alert and oriented to person, place, and time.        Diagnostic Studies & Laboratory data:     Recent Radiology Findings:    Imaging Results  No results found.         I have independently reviewed the above radiology studies  and reviewed the findings with the patient.    Recent Lab Findings: Recent Labs       Lab Results  Component Value Date    WBC 8.3 02/17/2022    HGB 14.8 02/17/2022    HCT 43.2 02/17/2022    PLT 164.0 02/17/2022    GLUCOSE 183 (H) 02/17/2022    CHOL 122  04/06/2022    TRIG 322 (H) 04/06/2022    HDL 21 (L) 04/06/2022    LDLDIRECT 42 04/06/2022    LDLCALC 51 04/06/2022    ALT 20 02/17/2022    AST 18 02/17/2022    NA 142 02/17/2022    K 5.1 02/17/2022    CL 106 02/17/2022    CREATININE 1.17 02/17/2022    BUN 17 02/17/2022    CO2 24 02/17/2022    TSH 5.37 02/17/2022    HGBA1C 8.5 (A) 02/17/2022          PFTs:   - FVC: 76% - FEV1: 75% -DLCO: 64%   Problem List: 1.1 cm PET avid right upper lobe pulmonary nodule Ongoing smoking      Assessment / Plan:   66yo male with 1.1cm RUL pulmonary nodule with increased avidity on PET.  Based on its appearance on cross-sectional imaging this is concerning for primary lung cancer.  The patient does continue to smoke but states that he is willing to stop.  He would like some time to consider his options and will let  us know in the next few weeks.  If he agrees to proceed with surgery I recommended that he undergo a combined navigational bronchoscopy with biopsy by Dr. Lamonte Sakai followed by a robotic assisted right thoracoscopy with right upper lobectomy as a single anesthetic event with me.     I  spent 55 minutes with  the patient face to face in counseling and coordination of care.     Yoshiye Kraft Bary Leriche

## 2022-05-10 NOTE — Brief Op Note (Signed)
05/10/2022  12:30 PM  PATIENT:  Celso Sickle  66 y.o. male  PRE-OPERATIVE DIAGNOSIS:  RIGHT UPPER LOBE PULMONARY NODULE  POST-OPERATIVE DIAGNOSIS:  RIGHT UPPER LOBE PULMONARY NODULE  PROCEDURE:  Procedure(s): XI ROBOTIC ASSISTED THORACOSCOPY-RIGHT UPPER LOBECTOMY (Right) INTERCOSTAL NERVE BLOCK (Right) NODE DISSECTION (Right)  SURGEON:  Surgeon(s) and Role:  Lightfoot, Lucile Crater, MD - Primary  PHYSICIAN ASSISTANT: Wynelle Beckmann PA-C  ASSISTANTS: none   ANESTHESIA:   local and general  EBL:  150 mL   BLOOD ADMINISTERED:none  DRAINS:  right pleural chest tube    LOCAL MEDICATIONS USED:  OTHER Exparel  SPECIMEN:  Source of Specimen:  RUL and multiple lymph nodes  DISPOSITION OF SPECIMEN:  PATHOLOGY  COUNTS CORRECT:  YES  DICTATION: .Dragon Dictation  PLAN OF CARE: Admit to inpatient   PATIENT DISPOSITION:  PACU - hemodynamically stable.   Delay start of Pharmacological VTE agent (>24hrs) due to surgical blood loss or risk of bleeding: no

## 2022-05-10 NOTE — Interval H&P Note (Signed)
History and Physical Interval Note:  05/10/2022 7:15 AM  Gregory Jensen  has presented today for surgery, with the diagnosis of lung nodule.  The various methods of treatment have been discussed with the patient and family. After consideration of risks, benefits and other options for treatment, the patient has consented to  Procedure(s): ROBOTIC ASSISTED NAVIGATIONAL BRONCHOSCOPY (N/A) as a surgical intervention.  The patient's history has been reviewed, patient examined, no change in status, stable for surgery.  I have reviewed the patient's chart and labs.  Questions were answered to the patient's satisfaction.     Collene Gobble

## 2022-05-10 NOTE — Op Note (Signed)
[address]       [place] [address]             [phone]        05/10/2022  Patient:  Gregory Jensen Pre-Op Dx: Right upper lobe pulmonary nodule   Post-op Dx:  Right upper lobe NSCLC Procedure: - Robotic assisted right video thoracoscopy - Right upper lobectomy - Mediastinal lymph node sampling - Intercostal nerve block  Surgeon and Role:      * Lajuana Matte, MD - Primary  Assistant: B. Stehler, PA-C  An experienced assistant was required given the complexity of this surgery and the standard of surgical care. The assistant was needed for exposure, dissection, suctioning, retraction of delicate tissues and sutures, instrument exchange and for overall help during this procedure.    Anesthesia  general EBL:  200 ml Blood Administration: none Specimen:  right upper lobe, hilar and mediastinal nodes  Drains: 28 F argyle chest tube in right chest Counts: correct   Indications: 66yo male with 1.1cm RUL pulmonary nodule with increased avidity on PET.  Based on its appearance on cross-sectional imaging this is concerning for primary lung cancer.  The patient does continue to smoke but states that he is willing to stop.  He would like some time to consider his options and will let us know in the next few weeks.  If he agrees to proceed with surgery I recommended that he undergo a combined navigational bronchoscopy with biopsy by Dr. Lamonte Sakai followed by a robotic assisted right thoracoscopy with right upper lobectomy as a single anesthetic event with me.    Findings: Normal anatomy.  No obvious level 7 nodes  Operative Technique: After the risks, benefits and alternatives were thoroughly discussed, the patient was brought to the operative theatre.  Anesthesia was induced, and the patient was then placed in a lateral decubitus position and was prepped and draped in normal sterile fashion.  An appropriate surgical pause was performed, and  pre-operative antibiotics were dosed accordingly.  We began by placing our 4 robotic ports in the the 7th intercostal space targeting the hilum of the lung.  A 54mm assistant port was placed in the 9th intercostal space in the anterior axillary line.  The robot was then docked and all instruments were passed under direct visualization.    The lung was then retracted superiorly, and the inferior pulmonary ligament was divided.  The hilum was mobilized anteriorly and posteriorly.  We identified the upper lobe pulmonary vein, and after careful isolation, it was divided with a vascular stapler.  We next moved to the pulmonary artery.  The artery was then divided with a vascular load stapler.  The bronchus to the upper lobe was then isolated.  After a test clamp, with good ventilation of the remaining lung, the bronchus was then divided.  The fissure was completed, and the specimen was passed into an endocatch bag.  It was removed from the anterior access site.    Lymph nodes were then sampled at hilum and mediastinum.  The chest was irrigated, and an air leak test was performed.  An intercostal nerve block was performed under direct visualization.  A 28 F chest tube was then placed, and we watch the remaining lobes re-expand.  The skin and soft tissue were closed with absorbable suture    The patient tolerated the procedure without any immediate complications, and was transferred to the PACU in stable condition.  Gregory Jensen  Gregory Jensen

## 2022-05-10 NOTE — Progress Notes (Signed)
Patient brought to 4E from PACU. VSS. Telemetry wall monitor applied, CCMD notified. Patient oriented to room and staff. Call bell in reach.  Daymon Larsen, RN

## 2022-05-10 NOTE — Transfer of Care (Signed)
Immediate Anesthesia Transfer of Care Note  Patient: Celso Sickle  Procedure(s) Performed: ROBOTIC ASSISTED NAVIGATIONAL BRONCHOSCOPY BRONCHIAL NEEDLE ASPIRATION BIOPSIES BRONCHIAL BRUSHINGS BRONCHIAL BIOPSIES FIDUCIAL DYE MARKING XI ROBOTIC ASSISTED THORACOSCOPY-RIGHT UPPER LOBECTOMY (Right: Chest) INTERCOSTAL NERVE BLOCK (Right: Chest) NODE DISSECTION (Right: Chest)  Patient Location: PACU  Anesthesia Type:General  Level of Consciousness: drowsy and patient cooperative  Airway & Oxygen Therapy: Patient Spontanous Breathing and Patient connected to face mask oxygen  Post-op Assessment: Report given to RN and Post -op Vital signs reviewed and stable  Post vital signs: Reviewed and stable  Last Vitals:  Vitals Value Taken Time  BP 154/70 05/10/22 1233  Temp    Pulse 77 05/10/22 1238  Resp 13 05/10/22 1238  SpO2 99 % 05/10/22 1238  Vitals shown include unvalidated device data.  Last Pain:  Vitals:   05/10/22 0647  TempSrc:   PainSc: 0-No pain         Complications: No notable events documented.

## 2022-05-10 NOTE — Op Note (Signed)
Video Bronchoscopy with Robotic Assisted Bronchoscopic Navigation   Date of Operation: 05/10/2022   Pre-op Diagnosis: Right upper lobe pulmonary nodule  Post-op Diagnosis: Same  Surgeon: Baltazar Apo  Assistants: None  Anesthesia: General endotracheal anesthesia  Operation: Flexible video fiberoptic bronchoscopy with robotic assistance and biopsies.  Estimated Blood Loss: Minimal  Complications: None  Indications and History: Gregory Jensen is a 66 y.o. male with history of tobacco use, prostate cancer.  He has a new right upper lobe pulmonary nodule noted on lung cancer screening CT scan, hypermetabolic on PET scan in July 2023.  Presents today for robotic assisted navigational bronchoscopy to facilitate biopsies and also dye marking in preparation for robotic VATS resection. The risks, benefits, complications, treatment options and expected outcomes were discussed with the patient.  The possibilities of pneumothorax, pneumonia, reaction to medication, pulmonary aspiration, perforation of a viscus, bleeding, failure to diagnose a condition and creating a complication requiring transfusion or operation were discussed with the patient who freely signed the consent.    Description of Procedure: The patient was seen in the Preoperative Area, was examined and was deemed appropriate to proceed.  The patient was taken to [facility] endoscopy room 3, identified as Gregory Jensen and the procedure verified as Flexible Video Fiberoptic Bronchoscopy.  A Time Out was held and the above information confirmed.   Prior to the date of the procedure a high-resolution CT scan of the chest was performed. Utilizing ION software program a virtual tracheobronchial tree was generated to allow the creation of distinct navigation pathways to the patient's parenchymal abnormalities. After being taken to the operating room general anesthesia was initiated and the patient  was orally intubated. The video fiberoptic  bronchoscope was introduced via the endotracheal tube and a general inspection was performed which showed normal right and left lung anatomy. Aspiration of the bilateral mainstems was completed to remove any remaining secretions. Robotic catheter inserted into patient's endotracheal tube.   Target #1 right upper lobe pulmonary nodule: The distinct navigation pathways prepared prior to this procedure were then utilized to navigate to patient's lesion identified on CT scan. The robotic catheter was secured into place and the vision probe was withdrawn.  Lesion location was approximated using fluoroscopy and radial endobronchial ultrasound for peripheral targeting. Under fluoroscopic guidance transbronchial needle brushings, transbronchial needle biopsies, and transbronchial forceps biopsies were performed to be sent for cytology and pathology.  Under fluoroscopic guidance 0.75 cc 1:1 mixture methylene blue: indocyanine green was injected using a transbronchial needle into the right upper lobe nodule.   At the end of the procedure a general airway inspection was performed and there was no evidence of active bleeding. The bronchoscope was removed.  The patient tolerated the procedure well. There was no significant blood loss and there were no obvious complications. A post-procedural chest x-ray is pending.  Samples Target #1: 1. Transbronchial needle brushings from right upper lobe pulmonary nodule 2. Transbronchial Wang needle biopsies from right upper lobe pulmonary nodule 3. Transbronchial forceps biopsies from right upper lobe pulmonary nodule   Plans:  The patient will be transferred to the OR 10 for robotic assisted VATS lobectomy.  Please refer also to Dr. Abran Duke procedure note.   Baltazar Apo, MD, PhD 05/10/2022, 8:34 AM [facility] [phone] or if no answer before 7:00PM call [phone] For any issues after 7:00PM please call eLink [phone]

## 2022-05-10 NOTE — Hospital Course (Addendum)
History of Present Illness:    Toivo T Chmiel 66 y.o. male presents for surgical evaluation and of a right upper lobe 1.2 cm pulmonary nodule with increased uptake on PET.  He has a significant smoking history, and was a part of his lung cancer screening program. The nodule was originally identified CT.  He denies any shortness of breath or productive coughs.  He denies any chest pain.  He does have some balance issues and walks with a cane. He continues to smoke. 47 pack year history      Hospital Course: He presented to [facility] and was brought to the operating room on 05/10/22. He underwent a robotic assisted right upper lobectomy and tolerated the procedure well. He was transferred to the progressive unit in stable condition.  Postoperative hospital course:  The patient has done well.  He is remained hemodynamically stable with sinus rhythm.  Oxygen has been weaned and he maintains good saturations on room air.  His chest tube has had moderate drainage early but this has significantly slowed over time.  He does have a persistent air leak and initially was treated with a chest tube on waterseal.  On postoperative day #3 his pneumothorax had enlarged and he was placed to 20 cm of water suction.  This pneumothorax improved over time however he continued to have a moderate size air leak and ultimately was determined that he would require discharge with a mini express chest tube drainage system.  He has a mild expected acute blood loss anemia is stable.  Renal function has remained within normal limits.  Incisions are noted to be healing well without evidence of infection.  He is tolerating diet.  He is ambulating in the hallways without difficulty.  Overall he is felt to be quite stable for discharge with a mini express system.

## 2022-05-11 ENCOUNTER — Inpatient Hospital Stay (HOSPITAL_COMMUNITY): Payer: PPO

## 2022-05-11 ENCOUNTER — Encounter (HOSPITAL_COMMUNITY): Payer: Self-pay | Admitting: Thoracic Surgery (Cardiothoracic Vascular Surgery)

## 2022-05-11 LAB — BASIC METABOLIC PANEL
Anion gap: 8 (ref 5–15)
BUN: 18 mg/dL (ref 8–23)
CO2: 22 mmol/L (ref 22–32)
Calcium: 8.4 mg/dL — ABNORMAL LOW (ref 8.9–10.3)
Chloride: 107 mmol/L (ref 98–111)
Creatinine, Ser: 1.13 mg/dL (ref 0.61–1.24)
GFR, Estimated: >60 mL/min (ref >60)
Glucose, Bld: 140 mg/dL — ABNORMAL HIGH (ref 70–99)
Potassium: 3.8 mmol/L (ref 3.5–5.1)
Sodium: 137 mmol/L (ref 135–145)

## 2022-05-11 LAB — CBC
HCT: 33.2 % — ABNORMAL LOW (ref 39.0–52.0)
Hemoglobin: 11.3 g/dL — ABNORMAL LOW (ref 13.0–17.0)
MCH: 31.7 pg (ref 26.0–34.0)
MCHC: 34 g/dL (ref 30.0–36.0)
MCV: 93.3 fL (ref 80.0–100.0)
Platelets: 156 10*3/uL (ref 150–400)
RBC: 3.56 MIL/uL — ABNORMAL LOW (ref 4.22–5.81)
RDW: 12.2 % (ref 11.5–15.5)
WBC: 11.7 10*3/uL — ABNORMAL HIGH (ref 4.0–10.5)
nRBC: 0 % (ref 0.0–0.2)

## 2022-05-11 LAB — GLUCOSE, CAPILLARY
Glucose-Capillary: 126 mg/dL — ABNORMAL HIGH (ref 70–99)
Glucose-Capillary: 131 mg/dL — ABNORMAL HIGH (ref 70–99)
Glucose-Capillary: 143 mg/dL — ABNORMAL HIGH (ref 70–99)
Glucose-Capillary: 151 mg/dL — ABNORMAL HIGH (ref 70–99)
Glucose-Capillary: 155 mg/dL — ABNORMAL HIGH (ref 70–99)

## 2022-05-11 LAB — SURGICAL PATHOLOGY

## 2022-05-11 MED ORDER — INSULIN ASPART 100 UNIT/ML IJ SOLN
0.0000 [IU] | Freq: Three times a day (TID) | INTRAMUSCULAR | Status: DC
  Administered 2022-05-12 – 2022-05-16 (×10): 2 [IU] via SUBCUTANEOUS
  Administered 2022-05-17: 4 [IU] via SUBCUTANEOUS

## 2022-05-11 MED ORDER — INSULIN ASPART 100 UNIT/ML IJ SOLN
0.0000 [IU] | INTRAMUSCULAR | Status: DC
  Administered 2022-05-11 (×2): 2 [IU] via SUBCUTANEOUS

## 2022-05-11 MED ORDER — ORAL CARE MOUTH RINSE: 15.0000 mL | OROMUCOSAL | Status: DC | PRN

## 2022-05-11 MED ORDER — ASPIRIN 81 MG PO TBEC
81.0000 mg | DELAYED_RELEASE_TABLET | Freq: Every day | ORAL | Status: DC
  Administered 2022-05-12 – 2022-05-17 (×6): 81 mg via ORAL
  Filled 2022-05-11 (×6): qty 1

## 2022-05-11 NOTE — Plan of Care (Signed)
  Problem: Education: Goal: Knowledge of disease or condition will improve Outcome: Progressing Goal: Knowledge of the prescribed therapeutic regimen will improve Outcome: Progressing   Problem: Activity: Goal: Risk for activity intolerance will decrease Outcome: Progressing   Problem: Cardiac: Goal: Will achieve and/or maintain hemodynamic stability Outcome: Progressing   Problem: Clinical Measurements: Goal: Postoperative complications will be avoided or minimized Outcome: Progressing   Problem: Respiratory: Goal: Respiratory status will improve Outcome: Progressing   Problem: Pain Management: Goal: Pain level will decrease Outcome: Progressing   Problem: Skin Integrity: Goal: Wound healing without signs and symptoms infection will improve Outcome: Progressing

## 2022-05-11 NOTE — Progress Notes (Signed)
Mobility Specialist Progress Note:   05/11/22 0849  Mobility  Activity Ambulated with assistance in hallway  Level of Assistance Standby assist, set-up cues, supervision of patient - no hands on  Assistive Device Cane  Distance Ambulated (ft) 350 ft  Activity Response Tolerated well  $Mobility charge 1 Mobility   Pt received EOB wiling to participate in mobility. No complaints of pain. Left EOB with call bell in reach and all needs met.   [facility] Surveyor, mining Chat only

## 2022-05-11 NOTE — Anesthesia Postprocedure Evaluation (Signed)
Anesthesia Post Note  Patient: Gregory Jensen  Procedure(s) Performed: ROBOTIC ASSISTED NAVIGATIONAL BRONCHOSCOPY BRONCHIAL NEEDLE ASPIRATION BIOPSIES BRONCHIAL BRUSHINGS BRONCHIAL BIOPSIES FIDUCIAL DYE MARKING XI ROBOTIC ASSISTED THORACOSCOPY-RIGHT UPPER LOBECTOMY (Right: Chest) INTERCOSTAL NERVE BLOCK (Right: Chest) NODE DISSECTION (Right: Chest)     Patient location during evaluation: PACU Anesthesia Type: General Level of consciousness: awake and alert Pain management: pain level controlled Vital Signs Assessment: post-procedure vital signs reviewed and stable Respiratory status: spontaneous breathing, nonlabored ventilation, respiratory function stable and patient connected to nasal cannula oxygen Cardiovascular status: blood pressure returned to baseline and stable Postop Assessment: no apparent nausea or vomiting Anesthetic complications: no   No notable events documented.  Last Vitals:  Vitals:   05/11/22 0438 05/11/22 0734  BP: (!) 124/59 (!) 118/57  Pulse: 68 61  Resp: 17 19  Temp: 36.6 C 36.7 C  SpO2: 98% 96%    Last Pain:  Vitals:   05/11/22 0734  TempSrc: Oral  PainSc: 0-No pain                 Santa Lighter

## 2022-05-11 NOTE — TOC Initial Note (Signed)
Transition of Care [facility]) - Initial/Assessment Note    Patient Details  Name: Gregory Jensen MRN: [id number] Date of Birth: [birth year]  Transition of Care [facility]) CM/SW Contact:    Ninfa Meeker, RN Phone Number: 05/11/2022, 4:29 PM  Clinical Narrative:                 Transition of Care Screening Note:Transition of Care Department [facility]) has reviewed patient and no TOC needs have been identified at this time. We will continue to monitor patient advancement through Interdisciplinary progressions. If new patient transition needs arise, please place a consult.           Patient Goals and CMS Choice        Expected Discharge Plan and Services                                                Prior Living Arrangements/Services                       Activities of Daily Living      Permission Sought/Granted                  Emotional Assessment              Admission diagnosis:  Lung nodule [R91.1] S/P lobectomy of lung [Z90.2] Patient Active Problem List   Diagnosis Date Noted   Lung nodule 05/10/2022   S/P lobectomy of lung 05/10/2022   Pulmonary nodule 03/23/2022   Peripheral neuropathy 01/22/2021   Actinic keratosis 07/23/2020   Joint pain 07/23/2019   Coronary artery calcification 04/19/2018   History of colonic polyps 01/14/2017   Diabetes mellitus without complication ([place]) 03/70/4888   Prostate cancer ([place]) 09/22/2010   Previous back surgery 09/22/2010   Carpal tunnel syndrome 09/22/2010   Nicotine dependence with current use 09/22/2010   Dyslipidemia associated with type 2 diabetes mellitus ([place]) 09/22/2010   PCP:  Vivi Barrack, MD Pharmacy:   [id number] - [place], [place] - [address] [place] [address] Phone: [phone] Fax: [phone]     Social Determinants of Health (SDOH) Interventions    Readmission Risk Interventions      No data to display

## 2022-05-11 NOTE — Progress Notes (Addendum)
[address]       Gregory Jensen,Gregory Jensen [id number]             [phone]      1 Day Post-Op Procedure(s) (LRB): XI ROBOTIC ASSISTED THORACOSCOPY-RIGHT UPPER LOBECTOMY (Right) INTERCOSTAL NERVE BLOCK (Right) NODE DISSECTION (Right) Subjective: Overall feels pretty well, some pain but controlled with meds  Objective: Vital signs in last 24 hours: Temp:  [97.7 F (36.5 C)-98.1 F (36.7 C)] 97.9 F (36.6 C) (09/26 0438) Pulse Rate:  [65-72] 68 (09/26 0438) Cardiac Rhythm: Normal sinus rhythm;Bundle branch block;Heart block (09/25 1900) Resp:  [10-18] 17 (09/26 0438) BP: (116-154)/(57-72) 124/59 (09/26 0438) SpO2:  [93 %-99 %] 98 % (09/26 0438)  Hemodynamic parameters for last 24 hours:    Intake/Output from previous day: 09/25 0701 - 09/26 0700 In: 2830 [P.O.:480; I.V.:1900; IV Piggyback:450] Out: 867 [Urine:565; Blood:150; Chest Tube:152] Intake/Output this shift: No intake/output data recorded.  General appearance: alert, cooperative, and no distress Heart: regular rate and rhythm Lungs: clear to auscultation bilaterally Abdomen: benign Extremities: no edema or calf tenderness Wound: incis healing well  Lab Results: Recent Labs    05/11/22 0144  WBC 11.7*  HGB 11.3*  HCT 33.2*  PLT 156   BMET:  Recent Labs    05/11/22 0144  NA 137  K 3.8  CL 107  CO2 22  GLUCOSE 140*  BUN 18  CREATININE 1.13  CALCIUM 8.4*    PT/INR: No results for input(s): "LABPROT", "INR" in the last 72 hours. ABG No results found for: "PHART", "HCO3", "TCO2", "ACIDBASEDEF", "O2SAT" CBG (last 3)  Recent Labs    05/10/22 1957 05/11/22 0019 05/11/22 0442  GLUCAP 174* 143* 126*    Meds Scheduled Meds:  acetaminophen  1,000 mg Oral Q6H   Or   acetaminophen (TYLENOL) oral liquid 160 mg/5 mL  1,000 mg Oral Q6H   aspirin EC  81 mg Oral q AM   atorvastatin  40 mg Oral QHS   bisacodyl  10 mg Oral Daily   enoxaparin (LOVENOX) injection  40 mg Subcutaneous Daily    insulin aspart  0-24 Units Subcutaneous Q4H   ketorolac  15 mg Intravenous Q6H   losartan  25 mg Oral Daily   pantoprazole  40 mg Oral Daily   senna-docusate  1 tablet Oral QHS   Continuous Infusions: PRN Meds:.morphine injection, ondansetron (ZOFRAN) IV, traMADol  Xrays DG Chest Port 1 View  Result Date: 05/10/2022 CLINICAL DATA:  Post lobectomy EXAM: PORTABLE CHEST 1 VIEW COMPARISON:  Portable exam 1341 hours compared to 0615 hours FINDINGS: Interval RIGHT upper lobe resection with RIGHT thoracostomy tube. Upper normal heart size. Mediastinal contours and pulmonary vascularity normal. Atherosclerotic calcification aorta. RIGHT apical pneumothorax despite chest tube. Remaining lungs clear. No infiltrate, pleural effusion or LEFT pneumothorax. IMPRESSION: Postoperative changes RIGHT chest with RIGHT apex pneumothorax despite thoracostomy tube. Electronically Signed   By: Lavonia Dana M.D.   On: 05/10/2022 15:21   DG C-ARM BRONCHOSCOPY  Result Date: 05/10/2022 C-ARM BRONCHOSCOPY: Fluoroscopy was utilized by the requesting physician.  No radiographic interpretation.   DG Chest 2 View  Result Date: 05/10/2022 CLINICAL DATA:  66 year old male under preoperative evaluation prior to surgery. EXAM: CHEST - 2 VIEW COMPARISON:  Chest x-ray 06/19/2010. FINDINGS: Lung volumes are normal. No consolidative airspace disease. No pleural effusions. No pneumothorax. No pulmonary nodule or mass noted. Pulmonary vasculature and the cardiomediastinal silhouette are within normal limits. Atherosclerosis in the thoracic aorta. IMPRESSION: 1.  No radiographic evidence of acute cardiopulmonary disease. 2. Aortic atherosclerosis. Electronically Signed   By: Vinnie Langton M.D.   On: 05/10/2022 06:26    Assessment/Plan: S/P Procedure(s) (LRB): XI ROBOTIC ASSISTED THORACOSCOPY-RIGHT UPPER LOBECTOMY (Right) INTERCOSTAL NERVE BLOCK (Right) NODE DISSECTION (Right) POD#1  1 afeb, s BP 110's-150's, sinus rhythm,  afebrile 2 sats good on RA 3 fair UOP 4 CT 152 cc recorded- large air leak 5 normal renal fxn 6 minor reactive leukocytosis with WBC 11.7 7 CBG fair control 120's-190's 8 CXR - mod Right upper space, consider placing to suction 9 lovenox for DVT ppx 10 routine pulm hygiene and rehab    LOS: 1 day    Gregory Giovanni PA-C Pager [phone] 05/11/2022    Agree with above Doing well IS and ambulation  Gregory Jensen O Gregory Jensen

## 2022-05-12 ENCOUNTER — Inpatient Hospital Stay (HOSPITAL_COMMUNITY): Payer: PPO

## 2022-05-12 LAB — COMPREHENSIVE METABOLIC PANEL
ALT: 15 U/L (ref 0–44)
AST: 25 U/L (ref 15–41)
Albumin: 3.5 g/dL (ref 3.5–5.0)
Alkaline Phosphatase: 37 U/L — ABNORMAL LOW (ref 38–126)
Anion gap: 12 (ref 5–15)
BUN: 27 mg/dL — ABNORMAL HIGH (ref 8–23)
CO2: 21 mmol/L — ABNORMAL LOW (ref 22–32)
Calcium: 8.8 mg/dL — ABNORMAL LOW (ref 8.9–10.3)
Chloride: 105 mmol/L (ref 98–111)
Creatinine, Ser: 1.27 mg/dL — ABNORMAL HIGH (ref 0.61–1.24)
GFR, Estimated: >60 mL/min (ref >60)
Glucose, Bld: 143 mg/dL — ABNORMAL HIGH (ref 70–99)
Potassium: 3.8 mmol/L (ref 3.5–5.1)
Sodium: 138 mmol/L (ref 135–145)
Total Bilirubin: 0.7 mg/dL (ref 0.3–1.2)
Total Protein: 5.8 g/dL — ABNORMAL LOW (ref 6.5–8.1)

## 2022-05-12 LAB — CBC
HCT: 33.1 % — ABNORMAL LOW (ref 39.0–52.0)
Hemoglobin: 11.2 g/dL — ABNORMAL LOW (ref 13.0–17.0)
MCH: 32 pg (ref 26.0–34.0)
MCHC: 33.8 g/dL (ref 30.0–36.0)
MCV: 94.6 fL (ref 80.0–100.0)
Platelets: 144 10*3/uL — ABNORMAL LOW (ref 150–400)
RBC: 3.5 MIL/uL — ABNORMAL LOW (ref 4.22–5.81)
RDW: 12.6 % (ref 11.5–15.5)
WBC: 8.1 10*3/uL (ref 4.0–10.5)
nRBC: 0 % (ref 0.0–0.2)

## 2022-05-12 LAB — GLUCOSE, CAPILLARY
Glucose-Capillary: 128 mg/dL — ABNORMAL HIGH (ref 70–99)
Glucose-Capillary: 107 mg/dL — ABNORMAL HIGH (ref 70–99)
Glucose-Capillary: 148 mg/dL — ABNORMAL HIGH (ref 70–99)
Glucose-Capillary: 141 mg/dL — ABNORMAL HIGH (ref 70–99)

## 2022-05-12 NOTE — Progress Notes (Addendum)
[address]       [employer] [address]             [phone]      2 Days Post-Op Procedure(s) (LRB): XI ROBOTIC ASSISTED THORACOSCOPY-RIGHT UPPER LOBECTOMY (Right) INTERCOSTAL NERVE BLOCK (Right) NODE DISSECTION (Right) Subjective: Feels pretty well  Objective: Vital signs in last 24 hours: Temp:  [97.4 F (36.3 C)-98.1 F (36.7 C)] 97.4 F (36.3 C) (09/27 0428) Pulse Rate:  [61-69] 67 (09/27 0428) Cardiac Rhythm: Normal sinus rhythm;Bundle branch block (09/26 1900) Resp:  [17-19] 18 (09/27 0428) BP: (108-144)/(54-88) 144/88 (09/27 0428) SpO2:  [96 %-98 %] 98 % (09/27 0428)  Hemodynamic parameters for last 24 hours:    Intake/Output from previous day: 09/26 0701 - 09/27 0700 In: 480 [P.O.:480] Out: 2477 [Urine:2275; Chest Tube:202] Intake/Output this shift: No intake/output data recorded.  General appearance: alert, cooperative, and no distress Heart: regular rate and rhythm Lungs: clear to auscultation bilaterally Abdomen: benign Extremities: no edema or calf tenderness Wound: incis healing well  Lab Results: Recent Labs    05/11/22 0144 05/12/22 0156  WBC 11.7* 8.1  HGB 11.3* 11.2*  HCT 33.2* 33.1*  PLT 156 144*   BMET:  Recent Labs    05/11/22 0144 05/12/22 0156  NA 137 138  K 3.8 3.8  CL 107 105  CO2 22 21*  GLUCOSE 140* 143*  BUN 18 27*  CREATININE 1.13 1.27*  CALCIUM 8.4* 8.8*    PT/INR: No results for input(s): "LABPROT", "INR" in the last 72 hours. ABG No results found for: "PHART", "HCO3", "TCO2", "ACIDBASEDEF", "O2SAT" CBG (last 3)  Recent Labs    05/11/22 1626 05/11/22 2123 05/12/22 0625  GLUCAP 131* 151* 128*    Meds Scheduled Meds:  acetaminophen  1,000 mg Oral Q6H   Or   acetaminophen (TYLENOL) oral liquid 160 mg/5 mL  1,000 mg Oral Q6H   aspirin EC  81 mg Oral Daily   atorvastatin  40 mg Oral QHS   bisacodyl  10 mg Oral Daily   enoxaparin (LOVENOX) injection  40 mg Subcutaneous Daily    insulin aspart  0-24 Units Subcutaneous TID WC   losartan  25 mg Oral Daily   pantoprazole  40 mg Oral Daily   senna-docusate  1 tablet Oral QHS   Continuous Infusions: PRN Meds:.morphine injection, ondansetron (ZOFRAN) IV, mouth rinse, traMADol  Xrays DG Chest Port 1 View  Result Date: 05/11/2022 CLINICAL DATA:  66 year old male postoperative day 1 status post right upper lobectomy for non-small cell lung cancer. EXAM: PORTABLE CHEST 1 VIEW COMPARISON:  Portable chest 05/10/2022 and earlier. FINDINGS: Portable AP upright view at 0618 hours. Stable right chest tube. Small volume right chest wall soft tissue gas. Right apical pneumothorax following lobectomy with no midline shift or significant architectural distortion. No pleural effusion. No pulmonary edema. Stable cardiac size and mediastinal contours. Visualized tracheal air column is within normal limits. No new pulmonary opacity. Paucity of bowel gas in the upper abdomen. IMPRESSION: Stable right chest tube and satisfactory postoperative appearance status post right upper lobectomy. Electronically Signed   By: Genevie Ann M.D.   On: 05/11/2022 09:16   DG Chest Port 1 View  Result Date: 05/10/2022 CLINICAL DATA:  Post lobectomy EXAM: PORTABLE CHEST 1 VIEW COMPARISON:  Portable exam 1341 hours compared to 0615 hours FINDINGS: Interval RIGHT upper lobe resection with RIGHT thoracostomy tube. Upper normal heart size. Mediastinal contours and pulmonary vascularity normal. Atherosclerotic calcification aorta.  RIGHT apical pneumothorax despite chest tube. Remaining lungs clear. No infiltrate, pleural effusion or LEFT pneumothorax. IMPRESSION: Postoperative changes RIGHT chest with RIGHT apex pneumothorax despite thoracostomy tube. Electronically Signed   By: Lavonia Dana M.D.   On: 05/10/2022 15:21   DG C-ARM BRONCHOSCOPY  Result Date: 05/10/2022 C-ARM BRONCHOSCOPY: Fluoroscopy was utilized by the requesting physician.  No radiographic interpretation.     Assessment/Plan: S/P Procedure(s) (LRB): XI ROBOTIC ASSISTED THORACOSCOPY-RIGHT UPPER LOBECTOMY (Right) INTERCOSTAL NERVE BLOCK (Right) NODE DISSECTION (Right) POD#2  1 afeb, VSS s bp 100'-140's, sinus rhythm 2 sats good on RA 3 CT 202 cc recorded/24 h, + air leak, keep on water seal for now 4 CXR w right apical pntx, similar in appearance 5 creat bumped a little to 1.27, toradol complete will not re-order for now. Making good urine output 6 expected ABLA is stable 7 minor thrombocytopenia- observe, is on lovenox for DVT ppx 8 cont rehab and pulm hygiene    LOS: 2 days    John Giovanni PA-C Pager [phone] 05/12/2022   Agree with above Leak with cough Continue IS, ambulation  Deja Kaigler O Dalani Mette

## 2022-05-12 NOTE — Progress Notes (Signed)
Mobility Specialist Progress Note:   05/12/22 0942  Mobility  Activity Ambulated with assistance in hallway  Level of Assistance Modified independent, requires aide device or extra time  Assistive Device Cane  Distance Ambulated (ft) 400 ft  Activity Response Tolerated well  $Mobility charge 1 Mobility   Pre- Mobility:  78 HR Post Mobility:   88 HR  Pt received in bed willing to participate in mobility. No complaints of pain. Left at chair by sink to get washed up.   [facility] Surveyor, mining Chat only

## 2022-05-12 NOTE — Progress Notes (Signed)
Mobility Specialist Progress Note:   05/12/22 1421  Mobility  Activity Ambulated with assistance in hallway  Level of Assistance Modified independent, requires aide device or extra time  Assistive Device [facility] Ambulated (ft) 300 ft  Activity Response Tolerated well  $Mobility charge 1 Mobility   Second walk of the [place] per pt request. Complaints of pain when coughing. Left EOB with call bell in reach and all needs met.   [facility] Surveyor, mining Chat only

## 2022-05-13 ENCOUNTER — Inpatient Hospital Stay (HOSPITAL_COMMUNITY): Payer: PPO

## 2022-05-13 LAB — GLUCOSE, CAPILLARY
Glucose-Capillary: 146 mg/dL — ABNORMAL HIGH (ref 70–99)
Glucose-Capillary: 117 mg/dL — ABNORMAL HIGH (ref 70–99)
Glucose-Capillary: 154 mg/dL — ABNORMAL HIGH (ref 70–99)
Glucose-Capillary: 130 mg/dL — ABNORMAL HIGH (ref 70–99)

## 2022-05-13 MED ORDER — LACTULOSE 10 GM/15ML PO SOLN
30.0000 g | Freq: Every day | ORAL | Status: DC | PRN
  Administered 2022-05-13: 30 g via ORAL
  Filled 2022-05-13: qty 45

## 2022-05-13 NOTE — Discharge Summary (Signed)
Physician Discharge Summary  Patient ID: Gregory Jensen MRN: [id number] DOB/AGE: [birth year] 66 y.o.  Admit date: 05/10/2022 Discharge date: 05/17/2022 Admission Diagnoses: Pulmonary nodule  Discharge Diagnoses:  Principal Problem:   Pulmonary nodule Active Problems:   Lung nodule   S/P lobectomy of lung  Patient Active Problem List   Diagnosis Date Noted   Lung nodule 05/10/2022   S/P lobectomy of lung 05/10/2022   Pulmonary nodule 03/23/2022   Peripheral neuropathy 01/22/2021   Actinic keratosis 07/23/2020   Joint pain 07/23/2019   Coronary artery calcification 04/19/2018   History of colonic polyps 01/14/2017   Diabetes mellitus without complication ([place]) 73/71/0626   Prostate cancer ([place]) 09/22/2010   Previous back surgery 09/22/2010   Carpal tunnel syndrome 09/22/2010   Nicotine dependence with current use 09/22/2010   Dyslipidemia associated with type 2 diabetes mellitus ([place]) 09/22/2010    Discharged Condition: good  History of Present Illness:    Gregory Jensen 66 y.o. male presents for surgical evaluation and of a right upper lobe 1.2 cm pulmonary nodule with increased uptake on PET.  He has a significant smoking history, and was a part of his lung cancer screening program. The nodule was originally identified CT.  He denies any shortness of breath or productive coughs.  He denies any chest pain.  He does have some balance issues and walks with a cane. He continues to smoke. 47 pack year history      Hospital Course: He presented to [facility] and was brought to the operating room on 05/10/22. He underwent a robotic assisted right upper lobectomy and tolerated the procedure well. He was transferred to the progressive unit in stable condition.  Postoperative hospital course:  The patient has done well.  He is remained hemodynamically stable with sinus rhythm.  Oxygen has been weaned and he maintains good saturations on room air.  His chest tube has  had moderate drainage early but this has significantly slowed over time.  He does have a persistent air leak and initially was treated with a chest tube on waterseal.  On postoperative day #3 his pneumothorax had enlarged and he was placed to 20 cm of water suction.  This pneumothorax improved over time however he continued to have a moderate size air leak and ultimately was determined that he would require discharge with a mini express chest tube drainage system.  He has a mild expected acute blood loss anemia is stable.  Renal function has remained within normal limits.  Incisions are noted to be healing well without evidence of infection.  He is tolerating diet.  He is ambulating in the hallways without difficulty.  Overall he is felt to be quite stable for discharge with a mini express system.    Consults: None   Significant Diagnostic Studies:  DG CHEST PORT 1 VIEW  Result Date: 05/17/2022 CLINICAL DATA:  Status post nodule resection.  Pneumothorax. EXAM: PORTABLE CHEST 1 VIEW COMPARISON:  05/16/2022 FINDINGS: 0548 hours. Right chest tube remains in place and appears to been pulled back slightly in the interval. Apical right pneumothorax is stable in the interval. Left lung clear. No substantial pleural effusion. Cardiopericardial silhouette is at upper limits of normal for size. Telemetry leads overlie the chest. IMPRESSION: Similar persistent right apical pneumothorax. No substantial interval change. Electronically Signed   By: Misty Stanley M.D.   On: 05/17/2022 06:19   DG Chest 1 View  Result Date: 05/16/2022 CLINICAL DATA:  Postop.  Right pneumothorax. EXAM:  CHEST  1 VIEW COMPARISON:  05/15/2022 and older studies. FINDINGS: Right apical pneumothorax is without change from the previous day's exam. Prominent interstitial markings. Mild linear atelectasis and/or scarring at the lung bases. Lungs otherwise clear. No pleural effusion.  No left pneumothorax. Stable right chest tube, tip at the  apex. IMPRESSION: 1. No change from the previous day's exam. Stable right apical pneumothorax. Stable right-sided chest tube. Electronically Signed   By: Lajean Manes M.D.   On: 05/16/2022 08:55   DG Chest 1 View  Result Date: 05/15/2022 CLINICAL DATA:  Pneumothorax. EXAM: CHEST  1 VIEW COMPARISON:  Chest radiograph 05/14/2022 and earlier FINDINGS: Apically directed right chest tube is in stable position. Persistent small right apical pneumothorax. Postsurgical changes at the right hilum. Left lung is clear. No pleural effusion bilaterally. Heart is normal in size. IMPRESSION: Persistent small right apical pneumothorax. Electronically Signed   By: Ileana Roup M.D.   On: 05/15/2022 12:07   DG Chest 1 View  Result Date: 05/14/2022 CLINICAL DATA:  Right chest tube. EXAM: CHEST  1 VIEW COMPARISON:  Multiple priors including most recent chest radiograph from April 23, 2022. FINDINGS: Right chest tube with significant decrease in size of the now small right pneumothorax. The heart size and mediastinal contours are unchanged. No focal airspace consolidation.  No pleural effusion. The visualized skeletal structures are unchanged. IMPRESSION: Right-sided thoracostomy tube with decrease in size of the now small right pneumothorax. Electronically Signed   By: Dahlia Bailiff M.D.   On: 05/14/2022 09:00   DG CHEST PORT 1 VIEW  Result Date: 05/13/2022 CLINICAL DATA:  Right chest tube,hx lobectomy EXAM: PORTABLE CHEST 1 VIEW COMPARISON:  Chest x-ray from yesterday. FINDINGS: Interval increase in right-sided pneumothorax, now large. Right chest tube in place. Probable mild leftward mediastinal shift. Cardiomediastinal silhouette is otherwise unchanged. Clear lungs. Postsurgical changes of right lobectomy. IMPRESSION: Interval increase in right-sided pneumothorax, now large. Right chest tube in place. Probable mild leftward mediastinal shift. These results will be called to the ordering clinician or representative  by the Radiologist Assistant, and communication documented in the PACS or [employer]. Electronically Signed   By: Margaretha Sheffield M.D.   On: 05/13/2022 08:07   DG CHEST PORT 1 VIEW  Result Date: 05/12/2022 CLINICAL DATA:  Chest tube present S/P lobectomy of lung EXAM: PORTABLE CHEST 1 VIEW COMPARISON:  Chest x-ray 05/11/2022. FINDINGS: Mildly decreased right pneumothorax with chest tube in place. Status post lobectomy. Clear lungs. No visible pleural effusions. Similar cardiomediastinal silhouette. IMPRESSION: Mildly decreased right pneumothorax with chest tube in place. Status post lobectomy. Electronically Signed   By: Margaretha Sheffield M.D.   On: 05/12/2022 08:28   DG Chest Port 1 View  Result Date: 05/11/2022 CLINICAL DATA:  66 year old male postoperative day 1 status post right upper lobectomy for non-small cell lung cancer. EXAM: PORTABLE CHEST 1 VIEW COMPARISON:  Portable chest 05/10/2022 and earlier. FINDINGS: Portable AP upright view at 0618 hours. Stable right chest tube. Small volume right chest wall soft tissue gas. Right apical pneumothorax following lobectomy with no midline shift or significant architectural distortion. No pleural effusion. No pulmonary edema. Stable cardiac size and mediastinal contours. Visualized tracheal air column is within normal limits. No new pulmonary opacity. Paucity of bowel gas in the upper abdomen. IMPRESSION: Stable right chest tube and satisfactory postoperative appearance status post right upper lobectomy. Electronically Signed   By: Genevie Ann M.D.   On: 05/11/2022 09:16   DG Chest [facility]  Result Date: 05/10/2022 CLINICAL DATA:  Post lobectomy EXAM: PORTABLE CHEST 1 VIEW COMPARISON:  Portable exam 1341 hours compared to 0615 hours FINDINGS: Interval RIGHT upper lobe resection with RIGHT thoracostomy tube. Upper normal heart size. Mediastinal contours and pulmonary vascularity normal. Atherosclerotic calcification aorta. RIGHT apical pneumothorax  despite chest tube. Remaining lungs clear. No infiltrate, pleural effusion or LEFT pneumothorax. IMPRESSION: Postoperative changes RIGHT chest with RIGHT apex pneumothorax despite thoracostomy tube. Electronically Signed   By: Lavonia Dana M.D.   On: 05/10/2022 15:21   DG C-ARM BRONCHOSCOPY  Result Date: 05/10/2022 C-ARM BRONCHOSCOPY: Fluoroscopy was utilized by the requesting physician.  No radiographic interpretation.   DG Chest 2 View  Result Date: 05/10/2022 CLINICAL DATA:  66 year old male under preoperative evaluation prior to surgery. EXAM: CHEST - 2 VIEW COMPARISON:  Chest x-ray 06/19/2010. FINDINGS: Lung volumes are normal. No consolidative airspace disease. No pleural effusions. No pneumothorax. No pulmonary nodule or mass noted. Pulmonary vasculature and the cardiomediastinal silhouette are within normal limits. Atherosclerosis in the thoracic aorta. IMPRESSION: 1.  No radiographic evidence of acute cardiopulmonary disease. 2. Aortic atherosclerosis. Electronically Signed   By: Vinnie Langton M.D.   On: 05/10/2022 06:26   CT Super D Chest Wo Contrast  Result Date: 05/05/2022 CLINICAL DATA:  Pulmonary nodule. EXAM: CT CHEST WITHOUT CONTRAST TECHNIQUE: Multidetector CT imaging of the chest was performed using thin slice collimation for electromagnetic bronchoscopy planning purposes, without intravenous contrast. RADIATION DOSE REDUCTION: This exam was performed according to the departmental dose-optimization program which includes automated exposure control, adjustment of the mA and/or kV according to patient size and/or use of iterative reconstruction technique. COMPARISON:  PET-CT 03/10/2022. FINDINGS: Cardiovascular: Heart size appears normal. Aortic atherosclerosis. Coronary artery calcifications. No pericardial effusion. Mediastinum/Nodes: No enlarged supraclavicular, axillary, or mediastinal lymph nodes. Hilar lymph nodes are suboptimally evaluated due to lack of IV contrast.  Lungs/Pleura: Mild centrilobular emphysema. Nodule within the posteromedial right upper lobe is again noted. On today's study this measures 1.6 x 1.1 cm, image 51/5 and image 97/6. On 03/10/2022 this measured 1.1 x 1.1 cm. No additional suspicious lung nodules. Upper Abdomen: No acute findings. Calcified granuloma identified within the liver. Musculoskeletal: No chest wall mass or suspicious bone lesions identified. No acute or suspicious osseous findings. IMPRESSION: 1. There has been mild increase in size of suspicious, FDG avid right upper lobe lung nodule. 2. No new sites of disease. 3. Coronary artery calcifications. 4. Aortic Atherosclerosis (ICD10-I70.0) and Emphysema (ICD10-J43.9). Electronically Signed   By: Kerby Moors M.D.   On: 05/05/2022 14:32       Treatments: surgery:    05/10/2022   Patient:  Celso Sickle Pre-Op Dx: Right upper lobe pulmonary nodule   Post-op Dx:  Right upper lobe NSCLC Procedure: - Robotic assisted right video thoracoscopy - Right upper lobectomy - Mediastinal lymph node sampling - Intercostal nerve block   Surgeon and Role:      * Lajuana Matte, MD - Primary   Assistant: B. Stehler, PA-C  PATHOLOGY:  Discharge Exam: Blood pressure 117/75, pulse 66, temperature 97.8 F (36.6 C), temperature source Oral, resp. rate 18, height _0  (1.727 m), weight 81.2 kg, SpO2 96 %.  General appearance: alert, cooperative, and no distress Heart: regular rate and rhythm Lungs: clear to auscultation bilaterally Abdomen: benign Extremities: no edema or calf tenderness Wound: incis healing well   Disposition: Discharge disposition: 01-Home or Self Care       Discharge Instructions     Discharge  patient   Complete by: As directed    After placed to mini-express chest tube system   Discharge disposition: 01-Home or Self Care   Discharge patient date: 05/17/2022      Allergies as of 05/17/2022   No Known Allergies      Medication List      TAKE these medications    aspirin EC 81 MG tablet Take 81 mg by mouth in the morning.   atorvastatin 40 MG tablet Commonly known as: LIPITOR Take 1 tablet (40 mg total) by mouth at bedtime.   empagliflozin 10 MG Tabs tablet Commonly known as: Jardiance Take 1 tablet (10 mg total) by mouth daily before breakfast.   fenofibrate 145 MG tablet Commonly known as: Tricor Take 1 tablet (145 mg total) by mouth daily.   glucose blood test strip Commonly known as: OneTouch Verio 1 each by Other route daily as needed for other. Use as instructed   ibuprofen 200 MG tablet Commonly known as: ADVIL Take 400-600 mg by mouth daily as needed (pain.).   losartan 25 MG tablet Commonly known as: COZAAR Take 1 tablet (25 mg total) by mouth daily.   metFORMIN 500 MG tablet Commonly known as: GLUCOPHAGE Take 1 tablet (500 mg total) by mouth 2 (two) times daily with a meal.   OneTouch Delica Lancets 41D Misc 1 each by Does not apply route daily as needed.   traMADol 50 MG tablet Commonly known as: ULTRAM Take 1 tablet (50 mg total) by mouth every 6 (six) hours as needed for up to 7 days (mild pain).   True Metrix Meter w/Device Kit USE AS DIRECTED        Follow-up Information     Lajuana Matte, MD Follow up.   Specialty: Cardiothoracic Surgery Why: Please see discharge paperwork for follow-up appointment details with Dr. Kipp Brood.Obtain a chest xray at [facility] 1/2 hour prior to appt. It is in the same office complex Contact information: [address] [place] [address] [phone]                 Signed: John Giovanni, PA-C  05/18/2022, 1:38 PM

## 2022-05-13 NOTE — Care Management Important Message (Signed)
Important Message  Patient Details  Name: Gregory Jensen MRN: [id number] Date of Birth: [birth year]   Medicare Important Message Given:  Yes     Orbie Pyo 05/13/2022, 3:13 PM

## 2022-05-13 NOTE — Progress Notes (Addendum)
      [address]       [place] [address]             [phone]      3 Days Post-Op Procedure(s) (LRB): XI ROBOTIC ASSISTED THORACOSCOPY-RIGHT UPPER LOBECTOMY (Right) INTERCOSTAL NERVE BLOCK (Right) NODE DISSECTION (Right) Subjective: Feels ok   Objective: Vital signs in last 24 hours: Temp:  [97.3 F (36.3 C)-98 F (36.7 C)] 97.9 F (36.6 C) (09/28 0323) Pulse Rate:  [60-74] 74 (09/28 0323) Cardiac Rhythm: Normal sinus rhythm;Bundle branch block (09/27 1901) Resp:  [13-20] 18 (09/28 0323) BP: (114-138)/(58-84) 129/77 (09/28 0323) SpO2:  [94 %-99 %] 97 % (09/28 0323)  Hemodynamic parameters for last 24 hours:    Intake/Output from previous day: 09/27 0701 - 09/28 0700 In: 720 [P.O.:720] Out: 150 [Chest Tube:150] Intake/Output this shift: No intake/output data recorded.  General appearance: alert, cooperative, and no distress Heart: regular rate and rhythm Lungs: dim in right upper fields Abdomen: benign Extremities: no edema Wound: incis healing well  Lab Results: Recent Labs    05/11/22 0144 05/12/22 0156  WBC 11.7* 8.1  HGB 11.3* 11.2*  HCT 33.2* 33.1*  PLT 156 144*   BMET:  Recent Labs    05/11/22 0144 05/12/22 0156  NA 137 138  K 3.8 3.8  CL 107 105  CO2 22 21*  GLUCOSE 140* 143*  BUN 18 27*  CREATININE 1.13 1.27*  CALCIUM 8.4* 8.8*    PT/INR: No results for input(s): "LABPROT", "INR" in the last 72 hours. ABG No results found for: "PHART", "HCO3", "TCO2", "ACIDBASEDEF", "O2SAT" CBG (last 3)  Recent Labs    05/12/22 1609 05/12/22 2115 05/13/22 0615  GLUCAP 148* 141* 146*    Meds Scheduled Meds:  acetaminophen  1,000 mg Oral Q6H   Or   acetaminophen (TYLENOL) oral liquid 160 mg/5 mL  1,000 mg Oral Q6H   aspirin EC  81 mg Oral Daily   atorvastatin  40 mg Oral QHS   bisacodyl  10 mg Oral Daily   enoxaparin (LOVENOX) injection  40 mg Subcutaneous Daily   insulin aspart  0-24 Units Subcutaneous TID WC    losartan  25 mg Oral Daily   pantoprazole  40 mg Oral Daily   senna-docusate  1 tablet Oral QHS   Continuous Infusions: PRN Meds:.morphine injection, ondansetron (ZOFRAN) IV, mouth rinse, traMADol  Xrays DG CHEST PORT 1 VIEW  Result Date: 05/12/2022 CLINICAL DATA:  Chest tube present S/P lobectomy of lung EXAM: PORTABLE CHEST 1 VIEW COMPARISON:  Chest x-ray 05/11/2022. FINDINGS: Mildly decreased right pneumothorax with chest tube in place. Status post lobectomy. Clear lungs. No visible pleural effusions. Similar cardiomediastinal silhouette. IMPRESSION: Mildly decreased right pneumothorax with chest tube in place. Status post lobectomy. Electronically Signed   By: Margaretha Sheffield M.D.   On: 05/12/2022 08:28    Assessment/Plan: S/P Procedure(s) (LRB): XI ROBOTIC ASSISTED THORACOSCOPY-RIGHT UPPER LOBECTOMY (Right) INTERCOSTAL NERVE BLOCK (Right) NODE DISSECTION (Right) POD#3  1 afeb, VSS sinus rhythm 2 O2 sats good on RA 3 CT 150 cc. + air leak, will place to 20 H2O suction 4 frequent voiding 5 no new labs 6 CXR- large pntx/space on right 7 will order lactulose for no recent BM 8 routine reha/pulm hygiene    LOS: 3 days    John Giovanni PA-C Pager [phone] 05/13/2022   Agree with above Chest tube to suction Continue IS  Tranae Laramie O Maddisyn Hegwood

## 2022-05-13 NOTE — Progress Notes (Signed)
Mobility Specialist Progress Note:   05/13/22 1115  Mobility  Activity Ambulated with assistance in hallway  Level of Assistance Modified independent, requires aide device or extra time  Assistive Device Cane  Distance Ambulated (ft) 200 ft  Activity Response Tolerated well  $Mobility charge 1 Mobility   During Mobility: 82 HR Post Mobility:   64 HR  Pt received EOB willing to participate in mobility. Still experiencing pain with movement d/t chest tube. Left EOB with call bell in reach and all needs met.   [facility] Surveyor, mining Chat only

## 2022-05-14 ENCOUNTER — Inpatient Hospital Stay (HOSPITAL_COMMUNITY): Payer: PPO

## 2022-05-14 LAB — CBC
HCT: 34.3 % — ABNORMAL LOW (ref 39.0–52.0)
Hemoglobin: 11.9 g/dL — ABNORMAL LOW (ref 13.0–17.0)
MCH: 32.2 pg (ref 26.0–34.0)
MCHC: 34.7 g/dL (ref 30.0–36.0)
MCV: 92.7 fL (ref 80.0–100.0)
Platelets: 164 10*3/uL (ref 150–400)
RBC: 3.7 MIL/uL — ABNORMAL LOW (ref 4.22–5.81)
RDW: 12.2 % (ref 11.5–15.5)
WBC: 7.4 10*3/uL (ref 4.0–10.5)
nRBC: 0 % (ref 0.0–0.2)

## 2022-05-14 LAB — BASIC METABOLIC PANEL
Anion gap: 4 — ABNORMAL LOW (ref 5–15)
BUN: 19 mg/dL (ref 8–23)
CO2: 27 mmol/L (ref 22–32)
Calcium: 9.4 mg/dL (ref 8.9–10.3)
Chloride: 107 mmol/L (ref 98–111)
Creatinine, Ser: 1.08 mg/dL (ref 0.61–1.24)
GFR, Estimated: >60 mL/min (ref >60)
Glucose, Bld: 148 mg/dL — ABNORMAL HIGH (ref 70–99)
Potassium: 3.9 mmol/L (ref 3.5–5.1)
Sodium: 138 mmol/L (ref 135–145)

## 2022-05-14 LAB — GLUCOSE, CAPILLARY
Glucose-Capillary: 158 mg/dL — ABNORMAL HIGH (ref 70–99)
Glucose-Capillary: 111 mg/dL — ABNORMAL HIGH (ref 70–99)
Glucose-Capillary: 122 mg/dL — ABNORMAL HIGH (ref 70–99)
Glucose-Capillary: 118 mg/dL — ABNORMAL HIGH (ref 70–99)

## 2022-05-14 LAB — CYTOLOGY - NON PAP

## 2022-05-14 MED ORDER — METFORMIN HCL 500 MG PO TABS
500.0000 mg | ORAL_TABLET | Freq: Two times a day (BID) | ORAL | Status: DC
  Administered 2022-05-14 – 2022-05-17 (×7): 500 mg via ORAL
  Filled 2022-05-14 (×7): qty 1

## 2022-05-14 MED ORDER — EMPAGLIFLOZIN 10 MG PO TABS
10.0000 mg | ORAL_TABLET | Freq: Every day | ORAL | Status: DC
  Administered 2022-05-14 – 2022-05-17 (×4): 10 mg via ORAL
  Filled 2022-05-14 (×4): qty 1

## 2022-05-14 NOTE — Progress Notes (Signed)
Mobility Specialist Criteria Algorithm Info.   05/14/22 0940  Mobility  Activity Ambulated with assistance in hallway  Range of Motion/Exercises Active;All extremities  Level of Assistance Modified independent, requires aide device or extra time  Assistive Device [employer] Ambulated (ft) 460 ft  Activity Response Tolerated well   Patient received in recliner chair and agreeable. Ambulated mod I with slow steady gait. Returned to room without complaint or incident. Was left in recliner with all needs met, call bell in reach.   [place] Natascha Edmonds, [place], [place]  UVQQU:[phone] Office: [phone]

## 2022-05-14 NOTE — Progress Notes (Addendum)
[address]       [place] [address]             [phone]      4 Days Post-Op Procedure(s) (LRB): XI ROBOTIC ASSISTED THORACOSCOPY-RIGHT UPPER LOBECTOMY (Right) INTERCOSTAL NERVE BLOCK (Right) NODE DISSECTION (Right) Subjective: Feels better, some minor discomforts from the tube, + BM  Objective: Vital signs in last 24 hours: Temp:  [97.3 F (36.3 C)-98.4 F (36.9 C)] 97.3 F (36.3 C) (09/29 0516) Pulse Rate:  [61-68] 62 (09/29 0516) Cardiac Rhythm: Normal sinus rhythm (09/29 0340) Resp:  [17-18] 18 (09/29 0516) BP: (116-143)/(64-73) 116/65 (09/29 0516) SpO2:  [93 %-98 %] 98 % (09/29 0516)  Hemodynamic parameters for last 24 hours:    Intake/Output from previous day: 09/28 0701 - 09/29 0700 In: 240 [P.O.:240] Out: 230 [Chest Tube:230] Intake/Output this shift: No intake/output data recorded.  General appearance: alert, cooperative, and no distress Heart: RRR Lungs: clear to auscultation bilaterally Abdomen: benign Extremities: no edema or calf tenderness Wound: healing well  Lab Results: Recent Labs    05/12/22 0156 05/14/22 0104  WBC 8.1 7.4  HGB 11.2* 11.9*  HCT 33.1* 34.3*  PLT 144* 164   BMET:  Recent Labs    05/12/22 0156 05/14/22 0104  NA 138 138  K 3.8 3.9  CL 105 107  CO2 21* 27  GLUCOSE 143* 148*  BUN 27* 19  CREATININE 1.27* 1.08  CALCIUM 8.8* 9.4    PT/INR: No results for input(s): "LABPROT", "INR" in the last 72 hours. ABG No results found for: "PHART", "HCO3", "TCO2", "ACIDBASEDEF", "O2SAT" CBG (last 3)  Recent Labs    05/13/22 1701 05/13/22 2147 05/14/22 0645  GLUCAP 154* 130* 158*    Meds Scheduled Meds:  acetaminophen  1,000 mg Oral Q6H   Or   acetaminophen (TYLENOL) oral liquid 160 mg/5 mL  1,000 mg Oral Q6H   aspirin EC  81 mg Oral Daily   atorvastatin  40 mg Oral QHS   bisacodyl  10 mg Oral Daily   enoxaparin (LOVENOX) injection  40 mg Subcutaneous Daily   insulin aspart  0-24 Units  Subcutaneous TID WC   losartan  25 mg Oral Daily   pantoprazole  40 mg Oral Daily   senna-docusate  1 tablet Oral QHS   Continuous Infusions: PRN Meds:.lactulose, morphine injection, ondansetron (ZOFRAN) IV, mouth rinse, traMADol  Xrays DG CHEST PORT 1 VIEW  Result Date: 05/13/2022 CLINICAL DATA:  Right chest tube,hx lobectomy EXAM: PORTABLE CHEST 1 VIEW COMPARISON:  Chest x-ray from yesterday. FINDINGS: Interval increase in right-sided pneumothorax, now large. Right chest tube in place. Probable mild leftward mediastinal shift. Cardiomediastinal silhouette is otherwise unchanged. Clear lungs. Postsurgical changes of right lobectomy. IMPRESSION: Interval increase in right-sided pneumothorax, now large. Right chest tube in place. Probable mild leftward mediastinal shift. These results will be called to the ordering clinician or representative by the Radiologist Assistant, and communication documented in the PACS or [employer]. Electronically Signed   By: Margaretha Sheffield M.D.   On: 05/13/2022 08:07    Assessment/Plan: S/P Procedure(s) (LRB): XI ROBOTIC ASSISTED THORACOSCOPY-RIGHT UPPER LOBECTOMY (Right) INTERCOSTAL NERVE BLOCK (Right) NODE DISSECTION (Right) POD#4  1 afeb,VSS, sinus rhythm 2 O2 sats good on RA 3 CT 230 cc, small air leak with cough( significantly improved)- will leave to suction for now 4 voiding well 5 CXR- significantly improved pntx, approx 10% 6 renal fxn now in normal range 7 H/H stable, improved trend  8 thrombocytopenia resolved 9 lovenox for DVT ppx 10 ambulation improving, cont as well as pulm hygiene 11 resume DM Meds  LOS: 4 days    Gaspar Bidding Pager [phone] 05/14/2022   Agree with above. Chest tube to suction today. We will transition to waterseal tomorrow.  Dagny Fiorentino Bary Leriche

## 2022-05-15 ENCOUNTER — Inpatient Hospital Stay (HOSPITAL_COMMUNITY): Payer: PPO

## 2022-05-15 LAB — GLUCOSE, CAPILLARY
Glucose-Capillary: 125 mg/dL — ABNORMAL HIGH (ref 70–99)
Glucose-Capillary: 93 mg/dL (ref 70–99)
Glucose-Capillary: 113 mg/dL — ABNORMAL HIGH (ref 70–99)

## 2022-05-15 NOTE — Progress Notes (Addendum)
[address]       [place] [address]             [phone]      5 Days Post-Op Procedure(s) (LRB): XI ROBOTIC ASSISTED THORACOSCOPY-RIGHT UPPER LOBECTOMY (Right) INTERCOSTAL NERVE BLOCK (Right) NODE DISSECTION (Right) Subjective: Feels pretty well  Objective: Vital signs in last 24 hours: Temp:  [97.8 F (36.6 C)-98 F (36.7 C)] 97.8 F (36.6 C) (09/30 0432) Pulse Rate:  [57-90] 90 (09/30 0432) Cardiac Rhythm: Normal sinus rhythm (09/29 2340) Resp:  [17-18] 17 (09/30 0432) BP: (107-146)/(62-77) 146/70 (09/30 0432) SpO2:  [93 %-97 %] 94 % (09/30 0432) Weight:  [81.2 kg] 81.2 kg (09/30 0432)  Hemodynamic parameters for last 24 hours:    Intake/Output from previous day: 09/29 0701 - 09/30 0700 In: 1150 [P.O.:1150] Out: 1130 [Urine:900; Chest Tube:230] Intake/Output this shift: No intake/output data recorded.  General appearance: alert, cooperative, and no distress Heart: regular rate and rhythm Lungs: clear to auscultation bilaterally Abdomen: benign Extremities: no edema or calf tenderness Wound: incis healing well  Lab Results: Recent Labs    05/14/22 0104  WBC 7.4  HGB 11.9*  HCT 34.3*  PLT 164   BMET:  Recent Labs    05/14/22 0104  NA 138  K 3.9  CL 107  CO2 27  GLUCOSE 148*  BUN 19  CREATININE 1.08  CALCIUM 9.4    PT/INR: No results for input(s): "LABPROT", "INR" in the last 72 hours. ABG No results found for: "PHART", "HCO3", "TCO2", "ACIDBASEDEF", "O2SAT" CBG (last 3)  Recent Labs    05/14/22 1656 05/14/22 2136 05/15/22 0635  GLUCAP 122* 118* 125*    Meds Scheduled Meds:  acetaminophen  1,000 mg Oral Q6H   Or   acetaminophen (TYLENOL) oral liquid 160 mg/5 mL  1,000 mg Oral Q6H   aspirin EC  81 mg Oral Daily   atorvastatin  40 mg Oral QHS   bisacodyl  10 mg Oral Daily   empagliflozin  10 mg Oral QAC breakfast   enoxaparin (LOVENOX) injection  40 mg Subcutaneous Daily   insulin aspart  0-24 Units  Subcutaneous TID WC   losartan  25 mg Oral Daily   metFORMIN  500 mg Oral BID WC   pantoprazole  40 mg Oral Daily   senna-docusate  1 tablet Oral QHS   Continuous Infusions: PRN Meds:.lactulose, morphine injection, ondansetron (ZOFRAN) IV, mouth rinse, traMADol  Xrays DG Chest 1 View  Result Date: 05/14/2022 CLINICAL DATA:  Right chest tube. EXAM: CHEST  1 VIEW COMPARISON:  Multiple priors including most recent chest radiograph from April 23, 2022. FINDINGS: Right chest tube with significant decrease in size of the now small right pneumothorax. The heart size and mediastinal contours are unchanged. No focal airspace consolidation.  No pleural effusion. The visualized skeletal structures are unchanged. IMPRESSION: Right-sided thoracostomy tube with decrease in size of the now small right pneumothorax. Electronically Signed   By: Dahlia Bailiff M.D.   On: 05/14/2022 09:00    Assessment/Plan: S/P Procedure(s) (LRB): XI ROBOTIC ASSISTED THORACOSCOPY-RIGHT UPPER LOBECTOMY (Right) INTERCOSTAL NERVE BLOCK (Right) NODE DISSECTION (Right) POD#5  1 afeb, VSS, s BP 110's to 140's 2 sats good on RA 3 CT 230 cc, + air leak, will see what CXR looks like prior to placing on H2O seal 4 CXR-pending 5 CBG good control 6 overall looks real good, cont pulm hygiene and rehab    LOS: 5 days  Patient seen  and examined, agree with above Still has a small air leak, CXR unchanged- will try on water seal  Remo Lipps C. Roxan Hockey, MD [facility] [phone]  Addendum- CXR improved, will place to [place] PA-C Pager [phone] 05/15/2022

## 2022-05-15 NOTE — Progress Notes (Signed)
Mobility Specialist Progress Note:   05/15/22 1041  Mobility  Activity Ambulated independently in hallway  Level of Assistance Modified independent, requires aide device or extra time  Assistive Device [facility] Ambulated (ft) 460 ft  Activity Response Tolerated well  $Mobility charge 1 Mobility   Pt received in chair and agreeable. No complaints. Pt left sitting EOB with all needs met and call bell in reach.   Gregory Jensen Mobility Specialist-Acute Rehab Secure Chat only

## 2022-05-16 ENCOUNTER — Inpatient Hospital Stay (HOSPITAL_COMMUNITY): Payer: PPO

## 2022-05-16 LAB — GLUCOSE, CAPILLARY
Glucose-Capillary: 144 mg/dL — ABNORMAL HIGH (ref 70–99)
Glucose-Capillary: 158 mg/dL — ABNORMAL HIGH (ref 70–99)
Glucose-Capillary: 154 mg/dL — ABNORMAL HIGH (ref 70–99)
Glucose-Capillary: 122 mg/dL — ABNORMAL HIGH (ref 70–99)
Glucose-Capillary: 129 mg/dL — ABNORMAL HIGH (ref 70–99)

## 2022-05-16 NOTE — Progress Notes (Signed)
      [address]       [place] [address]             [phone]      6 Days Post-Op Procedure(s) (LRB): XI ROBOTIC ASSISTED THORACOSCOPY-RIGHT UPPER LOBECTOMY (Right) INTERCOSTAL NERVE BLOCK (Right) NODE DISSECTION (Right) Subjective: Feels well, a little frustrated  Objective: Vital signs in last 24 hours: Temp:  [97.7 F (36.5 C)-98.5 F (36.9 C)] 98.3 F (36.8 C) (10/01 0700) Pulse Rate:  [62-97] 66 (10/01 0700) Cardiac Rhythm: Normal sinus rhythm (09/30 2010) Resp:  [16-20] 17 (10/01 0700) BP: (93-121)/(53-70) 121/67 (10/01 0700) SpO2:  [93 %-97 %] 96 % (10/01 0450)  Hemodynamic parameters for last 24 hours:    Intake/Output from previous day: 09/30 0701 - 10/01 0700 In: 500 [P.O.:500] Out: 100 [Chest Tube:100] Intake/Output this shift: No intake/output data recorded.  General appearance: alert, cooperative, and no distress Heart: regular rate and rhythm Lungs: clear to auscultation bilaterally Abdomen: benign Extremities: no edema or calf tenderness Wound: healing well  Lab Results: Recent Labs    05/14/22 0104  WBC 7.4  HGB 11.9*  HCT 34.3*  PLT 164   BMET:  Recent Labs    05/14/22 0104  NA 138  K 3.9  CL 107  CO2 27  GLUCOSE 148*  BUN 19  CREATININE 1.08  CALCIUM 9.4    PT/INR: No results for input(s): "LABPROT", "INR" in the last 72 hours. ABG No results found for: "PHART", "HCO3", "TCO2", "ACIDBASEDEF", "O2SAT" CBG (last 3)  Recent Labs    05/15/22 1122 05/15/22 1625 05/16/22 0615  GLUCAP 93 113* 144*    Meds Scheduled Meds:  aspirin EC  81 mg Oral Daily   atorvastatin  40 mg Oral QHS   bisacodyl  10 mg Oral Daily   empagliflozin  10 mg Oral QAC breakfast   enoxaparin (LOVENOX) injection  40 mg Subcutaneous Daily   insulin aspart  0-24 Units Subcutaneous TID WC   losartan  25 mg Oral Daily   metFORMIN  500 mg Oral BID WC   pantoprazole  40 mg Oral Daily   senna-docusate  1 tablet Oral QHS    Continuous Infusions: PRN Meds:.lactulose, morphine injection, ondansetron (ZOFRAN) IV, mouth rinse, traMADol  Xrays DG Chest 1 View  Result Date: 05/15/2022 CLINICAL DATA:  Pneumothorax. EXAM: CHEST  1 VIEW COMPARISON:  Chest radiograph 05/14/2022 and earlier FINDINGS: Apically directed right chest tube is in stable position. Persistent small right apical pneumothorax. Postsurgical changes at the right hilum. Left lung is clear. No pleural effusion bilaterally. Heart is normal in size. IMPRESSION: Persistent small right apical pneumothorax. Electronically Signed   By: Ileana Roup M.D.   On: 05/15/2022 12:07    Assessment/Plan: S/P Procedure(s) (LRB): XI ROBOTIC ASSISTED THORACOSCOPY-RIGHT UPPER LOBECTOMY (Right) INTERCOSTAL NERVE BLOCK (Right) NODE DISSECTION (Right) POD#6  1 afeb, VSS 2 sats good on RA 3 CXR- increased pntx/space on H2 O seal- may need to consider bronchial valves/mini- express depending on how he does  4 CT 100 ml drainage, + air leak, leave to H2O seal for now 5 voiding well 6 doing well with rehab/pulm hygiene, walking indep in halls frequently     LOS: 6 days    John Giovanni PA-C Pager [phone] 05/16/2022

## 2022-05-16 NOTE — Progress Notes (Signed)
Mobility Specialist Progress Note:   05/16/22 1010  Mobility  Activity Ambulated independently in hallway  Level of Assistance Modified independent, requires aide device or extra time  Assistive Device [facility] Ambulated (ft) 460 ft  Activity Response Tolerated well  $Mobility charge 1 Mobility   Pt received in bed and agreeable. No complaints. Pt left sitting EOB with all needs met and call bell in reach.   Jaxston Chohan Mobility Specialist-Acute Rehab Secure Chat only

## 2022-05-17 ENCOUNTER — Inpatient Hospital Stay (HOSPITAL_COMMUNITY): Payer: PPO

## 2022-05-17 LAB — GLUCOSE, CAPILLARY
Glucose-Capillary: 162 mg/dL — ABNORMAL HIGH (ref 70–99)
Glucose-Capillary: 120 mg/dL — ABNORMAL HIGH (ref 70–99)

## 2022-05-17 MED ORDER — TRAMADOL HCL 50 MG PO TABS: 50.0000 mg | ORAL_TABLET | Freq: Four times a day (QID) | ORAL | 0 refills | Status: AC | PRN

## 2022-05-17 MED ORDER — ATORVASTATIN CALCIUM 40 MG PO TABS: 40.0000 mg | ORAL_TABLET | Freq: Every day | ORAL | Status: DC

## 2022-05-17 NOTE — Discharge Instructions (Signed)
Please have patient watch video on Youtube:[facility] mini express. [facility] patient education  General Patient Instructions:  If the tube becomes disconnected, reconnect it immediately and tape it securely. To reconnect the tube: Pinch the chest tube with your fingers to close the chest tube until you can re attach it. If you are able, clean the ends of the chest tube and drain with an alcohol swab before reattaching.   If the tube becomes disconnected AND you cannot put it back together, go to closest Emergency Department  Please do NOT allow the mini express chamber to become full. To empty the fluid from the chamber: First, wash your hands with soap and water. Use a Luer lock syringe (provided by hospital or home health, if arranged) to attach to the front port (twist Luer lock syringe clock wise to to tighten) at the bottom of the mini express. Pull the syringe plunger back, unscrew the syringe and put fluid into the toilet. You may repeat as necessary. If it becomes difficult to empty the fluid in the mini express, squirt water through the port (where syringe attaches to) to flush out the blockage. If this does not work, call the office .  Try to keep mini express upright and below the level of your heart. Also, check periodically that there are no kinks in the tubing.   Changing the chest tube dressing: Please change the dressing at least every other day or if it becomes wet. You will be taught how to change the dressing before you are discharged from the hospital or home health will be arranged to do it for you.   If the chest falls out or gets pulled out: Place a piece of gauze over the site and cover the gauze with tape. Contact our office ASAP [phone]) If you have chest pain or sudden onset of shortness of breath, go to the nearest emergency room

## 2022-05-17 NOTE — Progress Notes (Signed)
[address]       [place] [address]             [phone]      7 Days Post-Op Procedure(s) (LRB): XI ROBOTIC ASSISTED THORACOSCOPY-RIGHT UPPER LOBECTOMY (Right) INTERCOSTAL NERVE BLOCK (Right) NODE DISSECTION (Right) Subjective: Feels some discomfort from the tube  Objective: Vital signs in last 24 hours: Temp:  [97.3 F (36.3 C)-98 F (36.7 C)] 98 F (36.7 C) (10/02 0335) Pulse Rate:  [65-124] 124 (10/02 0335) Cardiac Rhythm: Normal sinus rhythm;Bundle branch block (10/01 1938) Resp:  [14-18] 18 (10/02 0335) BP: (104-129)/(58-74) 129/74 (10/02 0335) SpO2:  [95 %-98 %] 98 % (10/02 0335)  Hemodynamic parameters for last 24 hours:    Intake/Output from previous day: 10/01 0701 - 10/02 0700 In: 120 [P.O.:120] Out: 210 [Chest Tube:210] Intake/Output this shift: No intake/output data recorded.  General appearance: alert, cooperative, and no distress Heart: regular rate and rhythm Lungs: clear to auscultation bilaterally Abdomen: benign Extremities: no edema or calf tenderness Wound: incis healing well  Lab Results: No results for input(s): "WBC", "HGB", "HCT", "PLT" in the last 72 hours. BMET: No results for input(s): "NA", "K", "CL", "CO2", "GLUCOSE", "BUN", "CREATININE", "CALCIUM" in the last 72 hours.  PT/INR: No results for input(s): "LABPROT", "INR" in the last 72 hours. ABG No results found for: "PHART", "HCO3", "TCO2", "ACIDBASEDEF", "O2SAT" CBG (last 3)  Recent Labs    05/16/22 1645 05/16/22 2124 05/17/22 0616  GLUCAP 122* 129* 162*    Meds Scheduled Meds:  aspirin EC  81 mg Oral Daily   atorvastatin  40 mg Oral QHS   bisacodyl  10 mg Oral Daily   empagliflozin  10 mg Oral QAC breakfast   enoxaparin (LOVENOX) injection  40 mg Subcutaneous Daily   insulin aspart  0-24 Units Subcutaneous TID WC   losartan  25 mg Oral Daily   metFORMIN  500 mg Oral BID WC   pantoprazole  40 mg Oral Daily   senna-docusate  1 tablet Oral  QHS   Continuous Infusions: PRN Meds:.lactulose, morphine injection, ondansetron (ZOFRAN) IV, mouth rinse, traMADol  Xrays DG CHEST PORT 1 VIEW  Result Date: 05/17/2022 CLINICAL DATA:  Status post nodule resection.  Pneumothorax. EXAM: PORTABLE CHEST 1 VIEW COMPARISON:  05/16/2022 FINDINGS: 0548 hours. Right chest tube remains in place and appears to been pulled back slightly in the interval. Apical right pneumothorax is stable in the interval. Left lung clear. No substantial pleural effusion. Cardiopericardial silhouette is at upper limits of normal for size. Telemetry leads overlie the chest. IMPRESSION: Similar persistent right apical pneumothorax. No substantial interval change. Electronically Signed   By: Misty Stanley M.D.   On: 05/17/2022 06:19   DG Chest 1 View  Result Date: 05/16/2022 CLINICAL DATA:  Postop.  Right pneumothorax. EXAM: CHEST  1 VIEW COMPARISON:  05/15/2022 and older studies. FINDINGS: Right apical pneumothorax is without change from the previous day's exam. Prominent interstitial markings. Mild linear atelectasis and/or scarring at the lung bases. Lungs otherwise clear. No pleural effusion.  No left pneumothorax. Stable right chest tube, tip at the apex. IMPRESSION: 1. No change from the previous day's exam. Stable right apical pneumothorax. Stable right-sided chest tube. Electronically Signed   By: Lajean Manes M.D.   On: 05/16/2022 08:55   DG Chest 1 View  Result Date: 05/15/2022 CLINICAL DATA:  Pneumothorax. EXAM: CHEST  1 VIEW COMPARISON:  Chest radiograph 05/14/2022 and earlier FINDINGS: Apically directed right chest  tube is in stable position. Persistent small right apical pneumothorax. Postsurgical changes at the right hilum. Left lung is clear. No pleural effusion bilaterally. Heart is normal in size. IMPRESSION: Persistent small right apical pneumothorax. Electronically Signed   By: Ileana Roup M.D.   On: 05/15/2022 12:07    Assessment/Plan: S/P Procedure(s)  (LRB): XI ROBOTIC ASSISTED THORACOSCOPY-RIGHT UPPER LOBECTOMY (Right) INTERCOSTAL NERVE BLOCK (Right) NODE DISSECTION (Right) POD#7  1 afeb, VSS 2 sats good on RA 3 no change in PNTX/space- appears about 15 % 4 CT - 130 cc, + air leak all chambers w/cough  could consider home with mini-express if doesn't resolve soon- he is otherwise very stable 5 cont pulm hygiene and rehab, he is walking frequently      LOS: 7 days    John Giovanni PA-C Pager [phone] 05/17/2022

## 2022-05-17 NOTE — Plan of Care (Signed)
DISCHARGE NOTE HOME Clennon T Kibble to be discharged home per MD order. Discussed prescriptions and follow up appointments with the patient. Medication list explained in detail. Patient verbalized understanding.  Skin clean, dry and intact without evidence of skin break down, no evidence of skin tears noted. IV catheter discontinued intact. Site without signs and symptoms of complications. Dressing and pressure applied. Pt denies pain at the site currently. No complaints noted.  Patient free of lines, drains, and wounds.   An After Visit Summary (AVS) was printed and given to the patient. Patient escorted via wheelchair, and discharged home via private auto.  Stephan Minister, RN

## 2022-05-18 ENCOUNTER — Other Ambulatory Visit: Payer: Self-pay | Admitting: *Deleted

## 2022-05-18 ENCOUNTER — Telehealth: Payer: Self-pay

## 2022-05-18 DIAGNOSIS — Z0000 Encounter for general adult medical examination without abnormal findings: Secondary | ICD-10-CM

## 2022-05-18 DIAGNOSIS — E1169 Type 2 diabetes mellitus with other specified complication: Secondary | ICD-10-CM

## 2022-05-18 MED ORDER — TRUE METRIX METER W/DEVICE KIT: PACK | 0 refills | Status: DC

## 2022-05-18 MED ORDER — ONETOUCH VERIO VI STRP: 1.0000 | ORAL_STRIP | Freq: Every day | 4 refills | Status: DC | PRN

## 2022-05-18 MED ORDER — ONETOUCH DELICA LANCETS 33G MISC: 1.0000 | Freq: Every day | 4 refills | Status: DC | PRN

## 2022-05-18 NOTE — Telephone Encounter (Signed)
Ok with me. Please place any necessary orders. 

## 2022-05-18 NOTE — Telephone Encounter (Signed)
Transition Care Management Follow-up Telephone Call Date of discharge and from where: [facility] 05/21/22 How have you been since you were released from the hospital? ok Any questions or concerns? No  Items Reviewed: Did the pt receive and understand the discharge instructions provided? Yes  Medications obtained and verified? Yes  Other? No  Any new allergies since your discharge? No  Dietary orders reviewed? Yes Do you have support at home? Yes   Home Care and Equipment/Supplies: Were home health services ordered? not applicable If so, what is the name of the agency?   Has the agency set up a time to come to the patient's home? not applicable Were any new equipment or medical supplies ordered?  No What is the name of the medical supply agency?  Were you able to get the supplies/equipment? not applicable Do you have any questions related to the use of the equipment or supplies? No  Functional Questionnaire: (I = Independent and D = Dependent) ADLs: I  Bathing/Dressing- I  Meal Prep- I  Eating- I  Maintaining continence- I  Transferring/Ambulation- I  Managing Meds- I  Follow up appointments reviewed:  PCP Hospital f/u appt confirmed? No  Specialist Hospital f/u appt confirmed? Yes  Scheduled to see Gregory Jensen on 05/21/22 @ 10:10. Are transportation arrangements needed? No  If their condition worsens, is the pt aware to call PCP or go to the Emergency Dept.? Yes Was the patient provided with contact information for the PCP's office or ED? Yes Was to pt encouraged to call back with questions or concerns? Yes

## 2022-05-18 NOTE — Telephone Encounter (Signed)
Rx send to pharmacy  

## 2022-05-20 ENCOUNTER — Other Ambulatory Visit: Payer: Self-pay | Admitting: *Deleted

## 2022-05-20 NOTE — Progress Notes (Signed)
The proposed treatment discussed in conference is for discussion purpose only and is not a binding recommendation.  The patients have not been physically examined, or presented with their treatment options.  Therefore, final treatment plans cannot be decided.  

## 2022-05-21 ENCOUNTER — Encounter: Payer: Self-pay | Admitting: Thoracic Surgery (Cardiothoracic Vascular Surgery)

## 2022-05-21 ENCOUNTER — Other Ambulatory Visit: Payer: Self-pay | Admitting: Thoracic Surgery (Cardiothoracic Vascular Surgery)

## 2022-05-21 ENCOUNTER — Ambulatory Visit
Admission: RE | Admit: 2022-05-21 | Discharge: 2022-05-21 | Disposition: A | Discharge status: Home / Self Care | Payer: PPO | Source: Ambulatory Visit | Attending: Thoracic Surgery (Cardiothoracic Vascular Surgery) | Admitting: Thoracic Surgery (Cardiothoracic Vascular Surgery)

## 2022-05-21 ENCOUNTER — Ambulatory Visit (INDEPENDENT_AMBULATORY_CARE_PROVIDER_SITE_OTHER): Payer: Self-pay | Admitting: Thoracic Surgery (Cardiothoracic Vascular Surgery)

## 2022-05-21 VITALS — BP 138/75 | HR 75 | Resp 18 | Ht 68.0 in | Wt 172.0 lb

## 2022-05-21 DIAGNOSIS — Z902 Acquired absence of lung [part of]: Secondary | ICD-10-CM

## 2022-05-21 NOTE — Progress Notes (Signed)
      [address]       [place] [address]             [phone]        Marik T Timmons [facility] Medical Record #[id number] Date of Birth: 1956/08/02  Referring: Collene Gobble, MD Primary Care: Vivi Barrack, MD Primary Cardiologist:None  Reason for visit:   follow-up  History of Present Illness:     66 year old male presents for his 1 week follow-up appointment.  The chest tubes remain in place.  He denies any shortness of breath.  Physical Exam: BP 138/75 (BP Location: Left Arm, Patient Position: Sitting)   Pulse 75   Resp 18   Ht 5\' 8"  (1.727 m)   Wt 172 lb (78 kg)   SpO2 98% Comment: rA  BMI 26.15 kg/m   Alert NAD Incision clean.  No leak with cough Abdomen, ND No peripheral edema   Diagnostic Studies & Laboratory data: CXR: Improved pneumothorax   FINAL MICROSCOPIC DIAGNOSIS:   A. LYMPH NODE, HILAR, EXCISION:  - Lymph node, negative for carcinoma (0/1)   B. LUNG, RIGHT UPPER LOBE, LOBECTOMY:  - Invasive moderately differentiated squamous cell carcinoma, 2.3 cm  - Visceral pleura is not involved  - Resection margins negative for carcinoma  - Lymph node, negative for carcinoma (0/1)  - See oncology table   C. LYMPH NODE, LEVEL 4, EXCISION:  - Lymph node, negative for carcinoma (0/1)   D. LYMPH NODE, LEVEL 4 #2, EXCISION:  - Lymph node, negative for carcinoma (0/1)       ONCOLOGY TABLE:   LUNG: Resection   Synchronous Tumors: Not applicable  Total Number of Primary Tumors: 1  Procedure: Lobectomy, lung  Specimen Laterality: Right  Tumor Focality: Unifocal  Tumor Site: Upper lobe  Tumor Size: 2.3 cm  Histologic Type: Squamous cell carcinoma, moderately differentiated  Visceral Pleura Invasion: Not identified  Direct Invasion of Adjacent Structures: No adjacent structures present  Lymphovascular Invasion: Not identified  Margins: All margins negative for invasive carcinoma       Closest Margin(s) to Invasive  Carcinoma: Bronchovascular margin       Margin(s) Involved by Invasive Carcinoma: Not applicable        Margin Status for Non-Invasive Tumor: Not applicable  Treatment Effect: No known presurgical therapy  Regional Lymph Nodes:       Number of Lymph Nodes Involved: 0                            Nodal Sites with Tumor: Not applicable       Number of Lymph Nodes Examined: 4                       Nodal Sites Examined: Levels 4 and 10  Distant Metastasis:       Distant Site(s) Involved: Not applicable  Pathologic Stage Classification (pTNM, AJCC 8th Edition): pT1c, pN0   Assessment / Plan:   66 year old male status post right upper lobectomy for stage I squamous cell cancer.  I clamped this chest tube and the x-ray showed improved pneumothorax.  The tube was removed in clinic.  We will follow-up with him next week for another chest x-ray.  Lajuana Matte 05/21/2022 5:52 PM

## 2022-05-26 ENCOUNTER — Other Ambulatory Visit: Payer: Self-pay | Admitting: Thoracic Surgery (Cardiothoracic Vascular Surgery)

## 2022-05-26 DIAGNOSIS — R911 Solitary pulmonary nodule: Secondary | ICD-10-CM

## 2022-05-27 ENCOUNTER — Ambulatory Visit
Admission: RE | Admit: 2022-05-27 | Discharge: 2022-05-27 | Disposition: A | Discharge status: Home / Self Care | Payer: PPO | Source: Ambulatory Visit | Attending: Thoracic Surgery (Cardiothoracic Vascular Surgery) | Admitting: Thoracic Surgery (Cardiothoracic Vascular Surgery)

## 2022-05-27 ENCOUNTER — Ambulatory Visit: Payer: Self-pay | Admitting: Thoracic Surgery (Cardiothoracic Vascular Surgery)

## 2022-05-27 VITALS — BP 129/67 | HR 72 | Resp 20 | Ht 68.0 in | Wt 175.0 lb

## 2022-05-27 DIAGNOSIS — Z902 Acquired absence of lung [part of]: Secondary | ICD-10-CM

## 2022-05-27 DIAGNOSIS — R911 Solitary pulmonary nodule: Secondary | ICD-10-CM

## 2022-05-27 NOTE — Progress Notes (Unsigned)
     [address]       [place] [address]             [phone]                                        KUTTER SCHNEPF [facility] Medical Record #[id number] Date of Birth: 04-May-1956   Referring: Leslye Peer, MD Primary Care: Ardith Dark, MD Primary Cardiologist:None   Reason for visit:   follow-up   History of Present Illness:     66 yo male presents for 1 month follow-up.  He has some pain at the access incision, but overall is doing well   Physical Exam: There were no vitals taken for this visit.   Alert NAD Incision well healed.   Abdomen, ND no peripheral edema     Diagnostic Studies & Laboratory data: CXR: clear     Assessment / Plan:   66 year old male status post right upper lobectomy for stage I squamous cell cancer. Overall doing well.  He will f/u at the cancer center for ongoing surveillance     Corliss Skains 05/27/2022 3:31 PM

## 2022-06-07 ENCOUNTER — Other Ambulatory Visit: Payer: Self-pay | Admitting: Internal Medicine

## 2022-06-07 ENCOUNTER — Telehealth: Payer: Self-pay | Admitting: Internal Medicine

## 2022-06-07 DIAGNOSIS — B3742 Candidal balanitis: Secondary | ICD-10-CM

## 2022-06-07 MED ORDER — FLUCONAZOLE 150 MG PO TABS: 150.0000 mg | ORAL_TABLET | Freq: Once | ORAL | 0 refills | Status: AC

## 2022-06-07 NOTE — Progress Notes (Signed)
Returned Mr. Kwiatek's call - he has some concerns about redness, discharge around the glans of the penis (uncircumsized). This is tender and red with some what discharge, which he feels is a yeast infection. He denies any urinary burning. He is on [place]. I advised him to stop it for now. Will call in a single dose of Fluconazole 150 mg x 1. He is advised to make an appt with PCP to evaluate - may need to d/c Jardiance all together d/t this, however, he says it has helped with his BG's.  Pixie Casino, MD, [facility], [place] Director of the Advanced Lipid Disorders &  Cardiovascular Risk Reduction Clinic Diplomate of the American Board of Clinical Lipidology Attending Cardiologist  Direct Dial: [phone]  Fax: [phone]  Website:  [email or web address]

## 2022-06-07 NOTE — Telephone Encounter (Signed)
Pt c/o medication issue:  1. Name of Medication:   empagliflozin (JARDIANCE) 10 MG TABS tablet    2. How are you currently taking this medication (dosage and times per day)? Take 1 tablet (10 mg total) by mouth daily before breakfast. - Oral  3. Are you having a reaction (difficulty breathing--STAT)?   4. What is your medication issue? Pt calling because he has a yeast infection and thinks its because of this medication. He is wondering if Dr. Debara Pickett can call him in some medication for it.

## 2022-06-24 ENCOUNTER — Other Ambulatory Visit: Payer: Self-pay | Admitting: Thoracic Surgery (Cardiothoracic Vascular Surgery)

## 2022-06-24 DIAGNOSIS — R911 Solitary pulmonary nodule: Secondary | ICD-10-CM

## 2022-06-24 NOTE — Progress Notes (Signed)
      [address]       [place] [address]             [phone]        Keylin T Vidrine [facility] Medical Record #[id number] Date of Birth: 1956-07-11  Referring: Collene Gobble, MD Primary Care: Vivi Barrack, MD Primary Cardiologist:None  Reason for visit:   follow-up  History of Present Illness:     66 yo male presents for 1 month follow-up.  He has some pain at the access incision, but overall is doing well  Physical Exam: There were no vitals taken for this visit.  Alert NAD Incision well healed.   Abdomen, ND no peripheral edema   Diagnostic Studies & Laboratory data: CXR: clear    Assessment / Plan:   66 year old male status post right upper lobectomy for stage I squamous cell cancer. Overall doing well.  He will f[facility] the [facility] for ongoing surveillance   Lajuana Matte 06/24/2022 1:58 PM

## 2022-06-25 ENCOUNTER — Ambulatory Visit (INDEPENDENT_AMBULATORY_CARE_PROVIDER_SITE_OTHER): Payer: Self-pay | Admitting: Thoracic Surgery (Cardiothoracic Vascular Surgery)

## 2022-06-25 ENCOUNTER — Ambulatory Visit
Admission: RE | Admit: 2022-06-25 | Discharge: 2022-06-25 | Disposition: A | Discharge status: Home / Self Care | Payer: PPO | Source: Ambulatory Visit | Attending: Thoracic Surgery (Cardiothoracic Vascular Surgery) | Admitting: Thoracic Surgery (Cardiothoracic Vascular Surgery)

## 2022-06-25 VITALS — BP 131/71 | HR 74 | Resp 20 | Ht 68.0 in | Wt 174.0 lb

## 2022-06-25 DIAGNOSIS — R911 Solitary pulmonary nodule: Secondary | ICD-10-CM

## 2022-06-25 DIAGNOSIS — Z902 Acquired absence of lung [part of]: Secondary | ICD-10-CM

## 2022-07-02 ENCOUNTER — Other Ambulatory Visit: Payer: Self-pay | Admitting: Family Medicine

## 2022-07-02 ENCOUNTER — Telehealth: Payer: Self-pay | Admitting: Hematology and Oncology

## 2022-07-02 NOTE — Telephone Encounter (Signed)
Contacted patient to scheduled appointments. Left message with appointment details and a call back number if patient had any questions or could not accommodate the time we provided.   

## 2022-07-20 ENCOUNTER — Encounter: Payer: Self-pay | Admitting: Family Medicine

## 2022-07-20 ENCOUNTER — Ambulatory Visit (INDEPENDENT_AMBULATORY_CARE_PROVIDER_SITE_OTHER): Payer: PPO | Admitting: Family Medicine

## 2022-07-20 VITALS — BP 136/68 | HR 71 | Ht 68.0 in | Wt 175.4 lb

## 2022-07-20 DIAGNOSIS — F17200 Nicotine dependence, unspecified, uncomplicated: Secondary | ICD-10-CM | POA: Diagnosis not present

## 2022-07-20 DIAGNOSIS — Z1211 Encounter for screening for malignant neoplasm of colon: Secondary | ICD-10-CM | POA: Diagnosis not present

## 2022-07-20 DIAGNOSIS — C3490 Malignant neoplasm of unspecified part of unspecified bronchus or lung: Secondary | ICD-10-CM

## 2022-07-20 DIAGNOSIS — C3411 Malignant neoplasm of upper lobe, right bronchus or lung: Secondary | ICD-10-CM | POA: Insufficient documentation

## 2022-07-20 DIAGNOSIS — I2584 Coronary atherosclerosis due to calcified coronary lesion: Secondary | ICD-10-CM

## 2022-07-20 DIAGNOSIS — C61 Malignant neoplasm of prostate: Secondary | ICD-10-CM

## 2022-07-20 DIAGNOSIS — E119 Type 2 diabetes mellitus without complications: Secondary | ICD-10-CM

## 2022-07-20 DIAGNOSIS — R209 Unspecified disturbances of skin sensation: Secondary | ICD-10-CM

## 2022-07-20 DIAGNOSIS — I2510 Atherosclerotic heart disease of native coronary artery without angina pectoris: Secondary | ICD-10-CM | POA: Diagnosis not present

## 2022-07-20 LAB — MICROALBUMIN / CREATININE URINE RATIO
Creatinine,U: 110.8 mg/dL
Microalb Creat Ratio: 3.7 mg/g (ref 0.0–30.0)
Microalb, Ur: 4.1 mg/dL — ABNORMAL HIGH (ref 0.0–1.9)

## 2022-07-20 LAB — COMPREHENSIVE METABOLIC PANEL
ALT: 11 U/L (ref 0–53)
AST: 13 U/L (ref 0–37)
Albumin: 4.4 g/dL (ref 3.5–5.2)
Alkaline Phosphatase: 57 U/L (ref 39–117)
BUN: 14 mg/dL (ref 6–23)
CO2: 26 mEq/L (ref 19–32)
Calcium: 9.4 mg/dL (ref 8.4–10.5)
Chloride: 104 mEq/L (ref 96–112)
Creatinine, Ser: 0.92 mg/dL (ref 0.40–1.50)
GFR: 86.16 mL/min (ref >60.00)
Glucose, Bld: 109 mg/dL — ABNORMAL HIGH (ref 70–99)
Potassium: 4.1 mEq/L (ref 3.5–5.1)
Sodium: 139 mEq/L (ref 135–145)
Total Bilirubin: 0.4 mg/dL (ref 0.2–1.2)
Total Protein: 6.9 g/dL (ref 6.0–8.3)

## 2022-07-20 LAB — CBC
HCT: 37.2 % — ABNORMAL LOW (ref 39.0–52.0)
Hemoglobin: 12.7 g/dL — ABNORMAL LOW (ref 13.0–17.0)
MCHC: 34.2 g/dL (ref 30.0–36.0)
MCV: 91.8 fl (ref 78.0–100.0)
Platelets: 273 10*3/uL (ref 150.0–400.0)
RBC: 4.06 Mil/uL — ABNORMAL LOW (ref 4.22–5.81)
RDW: 13.2 % (ref 11.5–15.5)
WBC: 7.8 10*3/uL (ref 4.0–10.5)

## 2022-07-20 LAB — LIPID PANEL
Cholesterol: 102 mg/dL (ref 0–200)
HDL: 22.4 mg/dL — ABNORMAL LOW (ref >39.00)
NonHDL: 79.14
Total CHOL/HDL Ratio: 5
Triglycerides: 204 mg/dL — ABNORMAL HIGH (ref 0.0–149.0)
VLDL: 40.8 mg/dL — ABNORMAL HIGH (ref 0.0–40.0)

## 2022-07-20 LAB — TSH: TSH: 2.48 u[IU]/mL (ref 0.35–5.50)

## 2022-07-20 LAB — PSA: PSA: 0 ng/mL — ABNORMAL LOW (ref 0.10–4.00)

## 2022-07-20 LAB — LDL CHOLESTEROL, DIRECT: Direct LDL: 55 mg/dL

## 2022-07-20 LAB — POCT GLYCOSYLATED HEMOGLOBIN (HGB A1C): Hemoglobin A1C: 6.2 % — AB (ref 4.0–5.6)

## 2022-07-20 NOTE — Assessment & Plan Note (Signed)
A1c today 6.2.  On metformin 500 mg twice daily.  He had some side effects with the Jardiance-do not think we need to repeat start today given his A1c.  We can recheck again in 3 to 6 months.

## 2022-07-20 NOTE — Assessment & Plan Note (Signed)
On aspirin and statin.  We will check labs today.  Continue management per cardiology.

## 2022-07-20 NOTE — Progress Notes (Signed)
   Gregory Jensen is a 66 y.o. male who presents today for an office visit.  Assessment/Plan:  New/Acute Problems: Cold Feet No signs of acute ischemia on exam today though has high risk of peripheral vascular disease given his medical history.  This could be due to peripheral neuropathy however we will check ABIs to further evaluate.  He is already on aspirin and statin.  May need referral to vascular surgery depending on results.  Chronic Problems Addressed Today: Diabetes mellitus without complication (HCC) O4H today 6.2.  On metformin 500 mg twice daily.  He had some side effects with the Jardiance-do not think we need to repeat start today given his A1c.  We can recheck again in 3 to 6 months.  Nicotine dependence with current use Patient was asked about his tobacco use today and was strongly advised to quit. Patient is currently contemplative. We reviewed treatment options to assist him quit smoking including NRT, Chantix, and Bupropion.  He would like to wean down without any further medications.  Follow up at next office visit.   Total time spent counseling approximately 3 minutes.    Coronary artery calcification On aspirin and statin.  We will check labs today.  Continue management per cardiology.  Prostate cancer No signs of recurrence.  Check PSA.  Lung cancer [facility] Doing well status post lobectomy.  Continue management per oncology.     Subjective:  HPI:  See A/p for status of chronic conditions.    Patient is here today for routine follow-up.  Since her last visit he was diagnosed with lung cancer.  Underwent lobectomy few months ago.  He is doing well postoperatively.  He believes that they were able to remove all the cancer.  He does have upcoming appointment with oncology.  He has been released from cardiothoracic surgery.  He was started on Jardiance by his cardiologist a few months ago however had to stop due to recurrent balanitis.  He is currently on  metformin 500 mg twice daily.  Tolerating well.  He is also been having issues with bilateral foot pain and cold sensation.  This has been going on for years.  He has talked with his previous doctors about this and was told it is probably due to his neuropathy.  No claudication symptoms.        Objective:  Physical Exam: BP 136/68   Pulse 71   Ht 5\' 8"  (1.727 m)   Wt 175 lb 6.4 oz (79.6 kg)   SpO2 97%   BMI 26.67 kg/m   Gen: No acute distress, resting comfortably CV: Regular rate and rhythm with no murmurs appreciated Pulm: Normal work of breathing, clear to auscultation bilaterally with no crackles, wheezes, or rhonchi MSK: -Bilateral feet without deformities.  Cool to touch.  Nontender to palpation.  Posterior tibialis pulses present bilaterally however unable to palpate dorsalis pedis pulses.  Neurovascular intact distally.  Good distal cap refill. Neuro: Grossly normal, moves all extremities Psych: Normal affect and thought content      Abrahan Fulmore M. [name], MD 07/20/2022 10:18 AM

## 2022-07-20 NOTE — Patient Instructions (Signed)
It was very nice to see you today!  We will check blood work today.  We will get an ultrasound to make sure that you are getting plenty blood flow to your feet.  Your A1c looks good today.  It is okay for you to stay off the Jardiance.  Please continue metformin 500 mg twice daily.  I will refer you for your colonoscopy.  We will see you back in 6 months.  Come back sooner if needed.  Take care, Dr [name]  PLEASE NOTE:  If you had any lab tests please let us know if you have not heard back within a few days. You may see your results on mychart before we have a chance to review them but we will give you a call once they are reviewed by [place]. If we ordered any referrals today, please let us know if you have not heard from their office within the next week.   Please try these tips to maintain a healthy lifestyle:  Eat at least 3 REAL meals and 1-2 snacks per day.  Aim for no more than 5 hours between eating.  If you eat breakfast, please do so within one hour of getting up.   Each meal should contain half fruits/vegetables, one quarter protein, and one quarter carbs (no bigger than a computer mouse)  Cut down on sweet beverages. This includes juice, soda, and sweet tea.   Drink at least 1 glass of water with each meal and aim for at least 8 glasses per day  Exercise at least 150 minutes every week.

## 2022-07-20 NOTE — Assessment & Plan Note (Signed)
No signs of recurrence.  Check PSA.

## 2022-07-20 NOTE — Assessment & Plan Note (Signed)
Doing well status post lobectomy.  Continue management per oncology.

## 2022-07-20 NOTE — Assessment & Plan Note (Signed)
Patient was asked about his tobacco use today and was strongly advised to quit. Patient is currently contemplative. We reviewed treatment options to assist him quit smoking including NRT, Chantix, and Bupropion.  He would like to wean down without any further medications.  Follow up at next office visit.   Total time spent counseling approximately 3 minutes.

## 2022-07-23 NOTE — Progress Notes (Signed)
Please inform patient of the following:  His blood counts are slightly low but improving since his surgery a few months ago.  His LDL is at goal.  His PSA is 0.  The rest of his labs are all stable.  Do not need to make any changes to treatment plan at this time.  We can recheck everything in a year.

## 2022-07-27 ENCOUNTER — Encounter: Payer: Self-pay | Admitting: Emergency Medicine

## 2022-07-27 ENCOUNTER — Other Ambulatory Visit: Payer: Self-pay

## 2022-07-27 ENCOUNTER — Inpatient Hospital Stay: Payer: PPO | Attending: Internal Medicine | Admitting: Internal Medicine

## 2022-07-27 ENCOUNTER — Ambulatory Visit: Payer: PPO | Admitting: Emergency Medicine

## 2022-07-27 VITALS — BP 142/71 | HR 80 | Temp 98.0°F | Resp 16 | Wt 175.9 lb

## 2022-07-27 VITALS — BP 132/76 | HR 88 | Temp 97.9°F | Ht 68.0 in | Wt 177.2 lb

## 2022-07-27 DIAGNOSIS — F17210 Nicotine dependence, cigarettes, uncomplicated: Secondary | ICD-10-CM

## 2022-07-27 DIAGNOSIS — Z8546 Personal history of malignant neoplasm of prostate: Secondary | ICD-10-CM

## 2022-07-27 DIAGNOSIS — E119 Type 2 diabetes mellitus without complications: Secondary | ICD-10-CM

## 2022-07-27 DIAGNOSIS — Z800 Family history of malignant neoplasm of digestive organs: Secondary | ICD-10-CM

## 2022-07-27 DIAGNOSIS — C3411 Malignant neoplasm of upper lobe, right bronchus or lung: Secondary | ICD-10-CM | POA: Diagnosis not present

## 2022-07-27 DIAGNOSIS — F17200 Nicotine dependence, unspecified, uncomplicated: Secondary | ICD-10-CM

## 2022-07-27 DIAGNOSIS — C3490 Malignant neoplasm of unspecified part of unspecified bronchus or lung: Secondary | ICD-10-CM

## 2022-07-27 LAB — LIPID PANEL
Chol/HDL Ratio: 5.8 ratio — ABNORMAL HIGH (ref 0.0–5.0)
Cholesterol, Total: 127 mg/dL (ref 100–199)
HDL: 22 mg/dL — ABNORMAL LOW (ref >39)
LDL Chol Calc (NIH): 63 mg/dL (ref 0–99)
Triglycerides: 256 mg/dL — ABNORMAL HIGH (ref 0–149)
VLDL Cholesterol Cal: 42 mg/dL — ABNORMAL HIGH (ref 5–40)

## 2022-07-27 LAB — HEMOGLOBIN A1C
Est. average glucose Bld gHb Est-mCnc: 137 mg/dL
Hgb A1c MFr Bld: 6.4 % — ABNORMAL HIGH (ref 4.8–5.6)

## 2022-07-27 NOTE — Assessment & Plan Note (Signed)
At risk for COPD although he denies any significant symptoms even post lung resection.  We will follow him clinically off bronchodilators for now.  Consider starting depending on how symptoms evolve going forward.

## 2022-07-27 NOTE — Assessment & Plan Note (Signed)
With surgical resection of a stage I squamous cell lung cancer in 04/2022.  Overall doing well postoperatively.  He is going to have surveillance CT scans with Dr. Julien Nordmann.  Unfortunately he is back to cigarette smoking.  We talked about this today, goal to cut down and ultimately stop.

## 2022-07-27 NOTE — Progress Notes (Signed)
Subjective:    Patient ID: Gregory Jensen, male    DOB: 12-01-1955, 66 y.o.   MRN: [id number]  HPI 66 year old gentleman with history of tobacco use (47 pack years), prostate cancer, diabetes, hyperlipidemia, sleep apnea.  He participates in lung cancer screening program and had a new 1.2 cm right upper lobe pulmonary nodule noted on most recent imaging.  This prompted PET scan and referral today. He feels well, is dealing with neuropathy, suspected diabetic. Some associated balance issues. He can exert, no breathing limitations. No real cough, does sometimes have a globus sensation. Moves slowly but no SOB.   Lung cancer screening CT chest 02/26/2022 reviewed by me shows no evidence of mediastinal or hilar adenopathy, mild paraseptal emphysematous change and diffuse bronchial wall thickening, stable pulmonary nodules identified and followed from prior CTs but a new 12.2 mm solid posterior right upper lobe nodule.  PET scan 03/10/2022 reviewed by me showed that the 11 mm right upper lobe pulmonary nodule is hypermetabolic.  There is no evidence for thoracic adenopathy or any distant disease.   ROV 04/28/2022 -- Gregory Jensen is 74 with history of smoking, prostate cancer, diabetes, hyperlipidemia, sleep apnea.  We have been evaluating a hypermetabolic 11 mm right upper lobe pulmonary nodule that appears to be isolated, consistent with stage I non-small cell lung cancer.  He has seen Dr. Kipp Brood and we are planning for navigational bronchoscopy for dye marking and then subsequent robotic VATS resection to be done on 05/10/2022.  He is scheduled for super D CT scan of the chest on 9/19 in preparation for that procedure.  He is not currently on bronchodilator therapy. He has cut down to 1-2 cig a day.   Pulmonary function testing 03/26/2022 reviewed by me shows mixed obstruction and restriction with FEV1 75% predicted, normal lung volumes, decreased diffusion capacity that corrects to the normal range  when adjusted for alveolar volume.  ROV 07/27/22 --follow-up visit for Mr. Gregory Jensen.  He is status post VATS resection of a right upper lobe pulmonary nodule 05/10/2022, turned out to be a squamous cell carcinoma.  Evaluation has been consistent with stage I disease and he is on observation.  He has seen Dr. Julien Nordmann.  He is not having any SOB. He does have some dry cough, throat irritation. Has has clear nasal gtt some days. No GERD. No wheeze.    Review of Systems As per HPI  Past Medical History:  Diagnosis Date   Cancer ([place]) 4 years ago   Prostate Cancer    Diabetes mellitus without complication ([place])    type 2   Hyperlipidemia    Neuropathy    in legs causing balance issues per patient   Sleep apnea    Smoker      Family History  Problem Relation Age of Onset   Alzheimer's disease Mother    Heart disease Father    Hypertension Sister    Heart disease Sister    Hypertension Sister    Stomach cancer Brother    Diabetes Other        family hx   Colon cancer Neg Hx      Social History   Socioeconomic History   Marital status: Significant Other    Spouse name: [name]   Number of children: 1   Years of education: 12   Highest education level: Not on file  Occupational History   Occupation: [employer]    Comment: Retired  Tobacco Use   Smoking status: Every  Day    Packs/day: 1.25    Years: 47.00    Total pack years: 58.75    Types: Cigarettes   Smokeless tobacco: Never   Tobacco comments:    1 pack of cigarettes smoked daily [facility] 07/27/22  Vaping Use   Vaping Use: Never used  Substance and Sexual Activity   Alcohol use: No    Alcohol/week: 0.0 standard drinks of alcohol   Drug use: No   Sexual activity: Not on file  Other Topics Concern   Not on file  Social History Narrative   Lives with wife. Has one son who lives in [place].    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     Social Determinants of Health    Financial Resource Strain: Low Risk  (05/14/2021)   Overall Financial Resource Strain (CARDIA)    Difficulty of Paying Living Expenses: Not hard at all  Food Insecurity: No Food Insecurity (05/11/2022)   Hunger Vital Sign    Worried About Running Out of Food in the Last Year: Never true    Ran Out of Food in the Last Year: Never true  Transportation Needs: No Transportation Needs (05/11/2022)   PRAPARE - Hydrologist (Medical): No    Lack of Transportation (Non-Medical): No  Physical Activity: Insufficiently Active (05/14/2021)   Exercise Vital Sign    Days of Exercise per Week: 3 days    Minutes of Exercise per Session: 30 min  Stress: No Stress Concern Present (05/14/2021)   [place]    Feeling of Stress : Not at all  Social Connections: Moderately Isolated (05/14/2021)   Social Connection and Isolation Panel [NHANES]    Frequency of Communication with Friends and Family: Twice a week    Frequency of Social Gatherings with Friends and Family: Once a week    Attends Religious Services: Never    Marine scientist or Organizations: No    Attends Archivist Meetings: Never    Marital Status: Living with partner  Intimate Partner Violence: Not At Risk (05/11/2022)   Humiliation, Afraid, Rape, and Kick questionnaire    Fear of Current or Ex-Partner: No    Emotionally Abused: No    Physically Abused: No    Sexually Abused: No     No Known Allergies   Outpatient Medications Prior to Visit  Medication Sig Dispense Refill   aspirin 81 MG EC tablet Take 81 mg by mouth in the morning.     atorvastatin (LIPITOR) 40 MG tablet Take 1 tablet (40 mg total) by mouth at bedtime.     Blood Glucose Monitoring Suppl (TRUE METRIX METER) w/Device KIT USE AS DIRECTED 1 kit 0   fenofibrate (TRICOR) 145 MG tablet Take 1 tablet (145 mg total) by mouth daily. 90 tablet 3   glucose blood  (ONETOUCH VERIO) test strip 1 each by Other route daily as needed for other. Use as instructed 100 each 4   ibuprofen (ADVIL) 200 MG tablet Take 400-600 mg by mouth daily as needed (pain.).     losartan (COZAAR) 25 MG tablet Take 1 tablet (25 mg total) by mouth daily. 30 tablet 11   metFORMIN (GLUCOPHAGE) 500 MG tablet TAKE 1 TABLET BY MOUTH 2 TIMES DAILY WITH A MEAL. 180 tablet 1   OneTouch Delica Lancets 59D MISC 1 each by Does not apply route daily as needed. 100 each  4   No facility-administered medications prior to visit.         Objective:   Physical Exam  Vitals:   07/27/22 1003  BP: 132/76  Pulse: 88  Temp: 97.9 F (36.6 C)  TempSrc: Oral  SpO2: 98%  Weight: 177 lb 3.2 oz (80.4 kg)  Height: 5' 8" (1.727 m)   Gen: Pleasant, well-nourished, in no distress,  normal affect  ENT: No lesions,  mouth clear,  oropharynx clear, no postnasal drip  Neck: No JVD, no stridor  Lungs: No use of accessory muscles, no crackles or wheezing on normal respiration, no wheeze on forced expiration  Cardiovascular: RRR, heart sounds normal, no murmur or gallops, no peripheral edema  Musculoskeletal: No deformities, no cyanosis or clubbing  Neuro: alert, awake, non focal  Skin: Warm, no lesions or rash      Assessment & Plan:  Lung cancer ([place]) With surgical resection of a stage I squamous cell lung cancer in 04/2022.  Overall doing well postoperatively.  He is going to have surveillance CT scans with Dr. Julien Nordmann.  Unfortunately he is back to cigarette smoking.  We talked about this today, goal to cut down and ultimately stop.  Nicotine dependence with current use At risk for COPD although he denies any significant symptoms even post lung resection.  We will follow him clinically off bronchodilators for now.  Consider starting depending on how symptoms evolve going forward.   Baltazar Apo, MD, PhD 07/27/2022, 10:42 AM [facility] [phone] or if no  answer before 7:00PM call [phone] For any issues after 7:00PM please call eLink [phone]

## 2022-07-27 NOTE — Patient Instructions (Addendum)
Continue to follow with Dr. Julien Nordmann and get your repeat CT scan per his plans. Work hard to decrease your cigarettes.  Ultimate goal will be to stop altogether. Follow Dr. Lamonte Sakai in 1 year or sooner if your breathing changes in any way.

## 2022-07-27 NOTE — Progress Notes (Signed)
[place] [phone]   [phone]  CONSULT NOTE  REFERRING PHYSICIAN: Dr. Melodie Bouillon  REASON FOR CONSULTATION:  66 years old white male recently diagnosed with lung cancer.  HPI Gregory Jensen is a 66 y.o. male with past medical history significant for prostate cancer, type 2 diabetes mellitus, dyslipidemia, peripheral neuropathy, sleep apnea as well as long history for smoking.  The patient had CT screening of the chest on February 26, 2022 and that showed new solid posterior right upper lobe pulmonary nodule measuring 1.22 cm in volume driven mean diameter.  No additional significant new pulmonary nodules and no mediastinal lymphadenopathy.  This was followed by a PET scan on March 10, 2022 and that showed hypermetabolic 1.1 cm right upper lobe pulmonary nodule highly suspicious for primary bronchogenic neoplasm.  There was no hypermetabolic thoracic adenopathy and no evidence of hypermetabolic distant metastatic disease.  The patient was seen by Dr. Lamonte Sakai and Dr. Kipp Brood and on May 10, 2022 he underwent video bronchoscopy with robotic assistant bronchoscopic navigation followed by robotic assisted right video thoracoscopy with right upper lobectomy and mediastinal lymph node sampling under the care of Dr. Kipp Brood.  The final pathology [id number]) showed invasive moderately differentiated squamous cell carcinoma measuring 2.3 cm with no evidence for visceral pleural or lymphovascular invasion.  And the dissected lymph nodes were negative for malignancy. The patient was referred to me today for evaluation and recommendation regarding his condition. When seen today the patient is feeling fine with no concerning complaints except for cough.  He denied having any chest pain, shortness breath or hemoptysis.  He has no nausea, vomiting, diarrhea or constipation.  He has no headache or visual changes.  He has no recent weight loss or night  sweats. Family history significant for father with heart disease.  Mother had dementia.  Brother had stomach cancer. The patient is single and has his significant other.  He also has 1 son.  He works in Theatre manager.  He has a history of smoking 1 pack/day for over 50 years and unfortunately continues to smoke and I strongly advised him to quit.  He has no history of alcohol or drug abuse.  HPI  Past Medical History:  Diagnosis Date   Cancer ([place]) 4 years ago   Prostate Cancer    Diabetes mellitus without complication ([place])    type 2   Hyperlipidemia    Neuropathy    in legs causing balance issues per patient   Sleep apnea    Smoker     Past Surgical History:  Procedure Laterality Date   BACK SURGERY  15 years ago   BRONCHIAL BIOPSY  05/10/2022   Procedure: BRONCHIAL BIOPSIES;  Surgeon: Collene Gobble, MD;  Location: [facility] ENDOSCOPY;  Service: Pulmonary;;   BRONCHIAL BRUSHINGS  05/10/2022   Procedure: BRONCHIAL BRUSHINGS;  Surgeon: Collene Gobble, MD;  Location: [facility] ENDOSCOPY;  Service: Pulmonary;;   BRONCHIAL NEEDLE ASPIRATION BIOPSY  05/10/2022   Procedure: BRONCHIAL NEEDLE ASPIRATION BIOPSIES;  Surgeon: Collene Gobble, MD;  Location: [facility] ENDOSCOPY;  Service: Pulmonary;;   COLONOSCOPY  2007 (at age 67)   pt does not know MD name/normal exam per pt.   FIDUCIAL MARKER PLACEMENT  05/10/2022   Procedure: FIDUCIAL DYE MARKING;  Surgeon: Collene Gobble, MD;  Location: [facility] ENDOSCOPY;  Service: Pulmonary;;   INTERCOSTAL NERVE BLOCK Right 05/10/2022   Procedure: INTERCOSTAL NERVE BLOCK;  Surgeon: Lajuana Matte, MD;  Location: [address];  Service: Thoracic;  Laterality: Right;   NODE DISSECTION Right 05/10/2022   Procedure: NODE DISSECTION;  Surgeon: Lajuana Matte, MD;  Location: [facility] OR;  Service: Thoracic;  Laterality: Right;   PROSTATECTOMY  4 years ago    Family History  Problem Relation Age of Onset   Alzheimer's disease Mother    Heart disease Father    Hypertension Sister     Heart disease Sister    Hypertension Sister    Stomach cancer Brother    Diabetes Other        family hx   Colon cancer Neg Hx     Social History Social History   Tobacco Use   Smoking status: Every Day    Packs/day: 1.25    Years: 47.00    Total pack years: 58.75    Types: Cigarettes   Smokeless tobacco: Never   Tobacco comments:    3 cigarettes smoked daily ARJ 04/28/22    2 cigarettes per day as of 05/06/22  Vaping Use   Vaping Use: Never used  Substance Use Topics   Alcohol use: No    Alcohol/week: 0.0 standard drinks of alcohol   Drug use: No    No Known Allergies  Current Outpatient Medications  Medication Sig Dispense Refill   aspirin 81 MG EC tablet Take 81 mg by mouth in the morning.     atorvastatin (LIPITOR) 40 MG tablet Take 1 tablet (40 mg total) by mouth at bedtime.     Blood Glucose Monitoring Suppl (TRUE METRIX METER) w/Device KIT USE AS DIRECTED 1 kit 0   fenofibrate (TRICOR) 145 MG tablet Take 1 tablet (145 mg total) by mouth daily. 90 tablet 3   glucose blood (ONETOUCH VERIO) test strip 1 each by Other route daily as needed for other. Use as instructed 100 each 4   ibuprofen (ADVIL) 200 MG tablet Take 400-600 mg by mouth daily as needed (pain.).     losartan (COZAAR) 25 MG tablet Take 1 tablet (25 mg total) by mouth daily. 30 tablet 11   metFORMIN (GLUCOPHAGE) 500 MG tablet TAKE 1 TABLET BY MOUTH 2 TIMES DAILY WITH A MEAL. 180 tablet 1   OneTouch Delica Lancets 79K MISC 1 each by Does not apply route daily as needed. 100 each 4   No current facility-administered medications for this visit.    Review of Systems  Constitutional: negative Eyes: negative Ears, nose, mouth, throat, and face: negative Respiratory: positive for cough Cardiovascular: negative Gastrointestinal: negative Genitourinary:negative Integument/breast: negative Hematologic/lymphatic: negative Musculoskeletal:negative Neurological: negative Behavioral/Psych:  negative Endocrine: negative Allergic/Immunologic: negative  Physical Exam  WIO:XBDZH, healthy, no distress, well nourished, and well developed SKIN: skin color, texture, turgor are normal, no rashes or significant lesions HEAD: Normocephalic, No masses, lesions, tenderness or abnormalities EYES: normal, PERRLA, Conjunctiva are pink and non-injected EARS: External ears normal, Canals clear OROPHARYNX:no exudate, no erythema, and lips, buccal mucosa, and tongue normal  NECK: supple, no adenopathy, no JVD LYMPH:  no palpable lymphadenopathy, no hepatosplenomegaly LUNGS: clear to auscultation , and palpation HEART: regular rate & rhythm, no murmurs, and no gallops ABDOMEN:abdomen soft, non-tender, normal bowel sounds, and no masses or organomegaly BACK: Back symmetric, no curvature., No CVA tenderness EXTREMITIES:no joint deformities, effusion, or inflammation, no edema  NEURO: alert & oriented x 3 with fluent speech, no focal motor/sensory deficits  PERFORMANCE STATUS: ECOG 1  LABORATORY DATA: Lab Results  Component Value Date   WBC 7.8 07/20/2022   HGB 12.7 (L) 07/20/2022  HCT 37.2 (L) 07/20/2022   MCV 91.8 07/20/2022   PLT 273.0 07/20/2022      Chemistry      Component Value Date/Time   NA 139 07/20/2022 1017   NA 138 04/27/2022 0847   K 4.1 07/20/2022 1017   CL 104 07/20/2022 1017   CO2 26 07/20/2022 1017   BUN 14 07/20/2022 1017   BUN 19 04/27/2022 0847   CREATININE 0.92 07/20/2022 1017   CREATININE 0.97 07/23/2020 0827      Component Value Date/Time   CALCIUM 9.4 07/20/2022 1017   ALKPHOS 57 07/20/2022 1017   AST 13 07/20/2022 1017   ALT 11 07/20/2022 1017   BILITOT 0.4 07/20/2022 1017       RADIOGRAPHIC STUDIES: No results found.  ASSESSMENT: This is a very pleasant 66 years old white male recently diagnosed with stage IA (T1c, N0, M0) non-small cell lung cancer, squamous cell carcinoma presented with right upper lobe in July 2023 status post right  upper lobectomy with lymph node sampling under the care of Dr. Kipp Brood on 05/10/2022 with tumor size of 2.3 cm.   PLAN: I had a lengthy discussion with the patient today about his current disease stage, prognosis and treatment options. I personally and independently reviewed the previous imaging studies as well as the pathology report and discussed it with the patient. I explained to the patient that he had received curative treatment option with the surgical resection. I also explained to the patient that there is no survival benefit for adjuvant systemic chemotherapy or radiation for patient with a stage Ia non-small cell lung cancer and the current standard of care is observation and close monitoring. I recommended for the patient to continue on observation with repeat CT scan of the chest in 6 months. I also strongly encouraged him to quit smoking. He was advised to call immediately if he has any other concerning symptoms in the interval.  The patient voices understanding of current disease status and treatment options and is in agreement with the current care plan.  All questions were answered. The patient knows to call the clinic with any problems, questions or concerns. We can certainly see the patient much sooner if necessary.  Thank you so much for allowing me to participate in the care of Gregory Jensen. I will continue to follow up the patient with you and assist in his care.  The total time spent in the appointment was 60 minutes.  Disclaimer: This note was dictated with voice recognition software. Similar sounding words can inadvertently be transcribed and may not be corrected upon review.   Eilleen Kempf July 27, 2022, 8:22 AM

## 2022-08-02 ENCOUNTER — Ambulatory Visit (HOSPITAL_COMMUNITY)
Admission: RE | Admit: 2022-08-02 | Discharge: 2022-08-02 | Disposition: A | Discharge status: Home / Self Care | Payer: PPO | Source: Ambulatory Visit | Attending: Family Medicine | Admitting: Family Medicine

## 2022-08-02 DIAGNOSIS — R209 Unspecified disturbances of skin sensation: Secondary | ICD-10-CM | POA: Insufficient documentation

## 2022-08-02 NOTE — Progress Notes (Signed)
ABI w/ TBI study completed.   Please see CV Proc for preliminary results.   Nakkia Mackiewicz, RDMS, RVT  

## 2022-08-03 ENCOUNTER — Encounter (HOSPITAL_BASED_OUTPATIENT_CLINIC_OR_DEPARTMENT_OTHER): Payer: Self-pay | Admitting: Internal Medicine

## 2022-08-03 ENCOUNTER — Ambulatory Visit (INDEPENDENT_AMBULATORY_CARE_PROVIDER_SITE_OTHER): Payer: PPO | Admitting: Internal Medicine

## 2022-08-03 VITALS — BP 140/64 | HR 73 | Ht 68.0 in | Wt 178.0 lb

## 2022-08-03 DIAGNOSIS — E781 Pure hyperglyceridemia: Secondary | ICD-10-CM

## 2022-08-03 DIAGNOSIS — I2510 Atherosclerotic heart disease of native coronary artery without angina pectoris: Secondary | ICD-10-CM | POA: Diagnosis not present

## 2022-08-03 DIAGNOSIS — E119 Type 2 diabetes mellitus without complications: Secondary | ICD-10-CM

## 2022-08-03 DIAGNOSIS — I10 Essential (primary) hypertension: Secondary | ICD-10-CM | POA: Diagnosis not present

## 2022-08-03 DIAGNOSIS — E785 Hyperlipidemia, unspecified: Secondary | ICD-10-CM | POA: Diagnosis not present

## 2022-08-03 MED ORDER — LOSARTAN POTASSIUM 50 MG PO TABS: 50.0000 mg | ORAL_TABLET | Freq: Every day | ORAL | 3 refills | Status: DC

## 2022-08-03 NOTE — Progress Notes (Signed)
LIPID CLINIC CONSULT NOTE  Chief Complaint:  Follow-up dyslipidemia  Primary Care Physician: Vivi Barrack, MD  Primary Cardiologist:  None  HPI:  Gregory Jensen is a 66 y.o. male who is being seen today for the evaluation of dyslipidemia at the request of Vivi Barrack, MD. this is a pleasant 66 year old male kindly referred for evaluation management of dyslipidemia.  He has a history of type 2 diabetes with neuropathy, dyslipidemia, sleep apnea and ongoing tobacco use more than 50 pack years.  He also has a history of prostate cancer.  He had a recent high-resolution CT scan for screening of lung cancer that showed two-vessel coronary calcification and aortic atherosclerosis.  He has had no clinical cardiac events.  He does have a history of heart disease in his sister and his father.  He has also had longstanding high cholesterol, particular high triglycerides he reports since he was a teenager.  In the past his triglycerides have been in the 14-15 100s but he says has also been in the thousands.  He has had some benefit from fenofibrate and most recently in December 2022 his total cholesterol was 122, HDL 23, triglycerides 380 had a direct LDL of 46.  Hemoglobin A1c at that time was 7.7%.  He has been on longstanding atorvastatin more recently on the 80 mg dose but then was also on fenofibrate 160 mg daily.  He thought this combination actually caused him worsening muscle aches.  Subsequently he does continue the fenofibrate and noticed improvement in his symptoms.  He has not had lipids retested since then.  He is also somewhat concerned about his blood pressure which was mildly elevated today 138/59.  He says he monitors it infrequently at home but does have a blood pressure cuff.  He has not been on any medications and does not have a diagnosis of hypertension however is a diabetic would be recommended to be on an ACE inhibitor or ARB for kidney prevention.  Based upon the HOPE  trial data.  04/13/2022  Gregory Jensen returns today for follow-up. He has seen his PCP in the interim. Repeat labwork unfortunately shows worsening hemoglobin A1c now up to 8.5% from 7.7%.  His triglycerides however have improved with total cholesterol now 122, triglycerides of 322, HDL of 21 and LDL of 51.  Unfortunately, he was found to have a hypermetabolic lung nodule confirmed with PET/CT.  He has seen thoracic surgery and pulmonary with plans for robotic assisted thoracotomy and excision.  Dr. Kipp Brood with thoracic surgery had recommended stress testing prior to that given two-vessel coronary disease.  He seems to be asymptomatic however it is reasonable to consider this.  His blood pressure again remains elevated, systolic in the 478G.  In addition as mentioned based on HOPE trial data he would benefit from an ARB or ACE inhibitor for renal protection.  08/03/2022  Gregory Jensen is seen today in follow-up.  He is doing fairly well after recent lobectomy for lung cancer.  Unfortunately continues to smoke.  He has been working to try to cut that back.  He is now seeing oncology.  His recent lipids are reasonably well-controlled except for some high triglycerides.  Total cholesterol 127, triglycerides 256, HDL 22 and LDL 63 on 40 mg atorvastatin daily.  His A1c was 6.4%.  I had placed him on Jardiance, but he called in with some discharge from the penis.  He was suspected to have a candidal balanitis, and I treated him  with Diflucan and topical antifungal cream which resolved the issue.  He also then stopped the Jardiance and has not restarted it.  Blood pressure is a little elevated today 140/64.  He noted that recent labs showed he had some microalbuminuria.  He is on low-dose losartan.  PMHx:  Past Medical History:  Diagnosis Date   Cancer ([place]) 4 years ago   Prostate Cancer    Diabetes mellitus without complication ([place])    type 2   Hyperlipidemia    Neuropathy    in legs causing balance  issues per patient   Sleep apnea    Smoker     Past Surgical History:  Procedure Laterality Date   BACK SURGERY  15 years ago   BRONCHIAL BIOPSY  05/10/2022   Procedure: BRONCHIAL BIOPSIES;  Surgeon: Collene Gobble, MD;  Location: [facility] ENDOSCOPY;  Service: Pulmonary;;   BRONCHIAL BRUSHINGS  05/10/2022   Procedure: BRONCHIAL BRUSHINGS;  Surgeon: Collene Gobble, MD;  Location: [facility] ENDOSCOPY;  Service: Pulmonary;;   BRONCHIAL NEEDLE ASPIRATION BIOPSY  05/10/2022   Procedure: BRONCHIAL NEEDLE ASPIRATION BIOPSIES;  Surgeon: Collene Gobble, MD;  Location: [facility] ENDOSCOPY;  Service: Pulmonary;;   COLONOSCOPY  2007 (at age 3)   pt does not know MD name/normal exam per pt.   FIDUCIAL MARKER PLACEMENT  05/10/2022   Procedure: FIDUCIAL DYE MARKING;  Surgeon: Collene Gobble, MD;  Location: [facility] ENDOSCOPY;  Service: Pulmonary;;   INTERCOSTAL NERVE BLOCK Right 05/10/2022   Procedure: INTERCOSTAL NERVE BLOCK;  Surgeon: Lajuana Matte, MD;  Location: [place];  Service: Thoracic;  Laterality: Right;   NODE DISSECTION Right 05/10/2022   Procedure: NODE DISSECTION;  Surgeon: Lajuana Matte, MD;  Location: [facility] OR;  Service: Thoracic;  Laterality: Right;   PROSTATECTOMY  4 years ago    FAMHx:  Family History  Problem Relation Age of Onset   Alzheimer's disease Mother    Heart disease Father    Hypertension Sister    Heart disease Sister    Hypertension Sister    Stomach cancer Brother    Diabetes Other        family hx   Colon cancer Neg Hx     SOCHx:   reports that he has been smoking cigarettes. He has a 58.75 pack-year smoking history. He has never used smokeless tobacco. He reports that he does not drink alcohol and does not use drugs.  ALLERGIES:  Allergies  Allergen Reactions   Jardiance [Empagliflozin] Other (See Comments)    Candidal balanitis    ROS: Pertinent items noted in HPI and remainder of comprehensive ROS otherwise negative.  HOME MEDS: Current Outpatient Medications  on File Prior to Visit  Medication Sig Dispense Refill   aspirin 81 MG EC tablet Take 81 mg by mouth in the morning.     atorvastatin (LIPITOR) 40 MG tablet Take 1 tablet (40 mg total) by mouth at bedtime.     Blood Glucose Monitoring Suppl (TRUE METRIX METER) w/Device KIT USE AS DIRECTED 1 kit 0   fenofibrate (TRICOR) 145 MG tablet Take 1 tablet (145 mg total) by mouth daily. 90 tablet 3   glucose blood (ONETOUCH VERIO) test strip 1 each by Other route daily as needed for other. Use as instructed 100 each 4   ibuprofen (ADVIL) 200 MG tablet Take 400-600 mg by mouth daily as needed (pain.).     metFORMIN (GLUCOPHAGE) 500 MG tablet TAKE 1 TABLET BY MOUTH 2 TIMES DAILY WITH A MEAL.  180 tablet 1   OneTouch Delica Lancets 29H MISC 1 each by Does not apply route daily as needed. 100 each 4   No current facility-administered medications on file prior to visit.    LABS/IMAGING: No results found for this or any previous visit (from the past 48 hour(s)). VAS [place] ABI WITH/WO TBI  Result Date: 08/02/2022  LOWER EXTREMITY DOPPLER STUDY Patient Name:  VASHAWN EKSTEIN  Date of Exam:   08/02/2022 Medical Rec #: [id number]            Accession #:    [id number] Date of Birth: 04-30-1956            Patient Gender: M Patient Age:   52 years Exam Location:  [facility] Procedure:      VAS [place] ABI WITH/WO TBI Referring Phys: CALEB PARKER --------------------------------------------------------------------------------  High Risk Factors: Hyperlipidemia, Diabetes, current smoker, coronary artery                    disease. Other Factors: Peripheral neuropathy, history of lung cancer with lobectomy.  Comparison Study: No prior studies. Performing Technologist: Darlin Coco RDMS, RVT  Examination Guidelines: A complete evaluation includes at minimum, Doppler waveform signals and systolic blood pressure reading at the level of bilateral brachial, anterior tibial, and posterior tibial arteries, when vessel  segments are accessible. Bilateral testing is considered an integral part of a complete examination. Photoelectric Plethysmograph (PPG) waveforms and toe systolic pressure readings are included as required and additional duplex testing as needed. Limited examinations for reoccurring indications may be performed as noted.  ABI Findings: +---------+------------------+-----+---------+--------+ Right    Rt Pressure (mmHg)IndexWaveform Comment  +---------+------------------+-----+---------+--------+ Brachial 141                    triphasic         +---------+------------------+-----+---------+--------+ PTA      156               1.11 triphasic         +---------+------------------+-----+---------+--------+ DP       165               1.17 triphasic         +---------+------------------+-----+---------+--------+ Great Toe84                0.60                   +---------+------------------+-----+---------+--------+ +---------+------------------+-----+---------+-------+ Left     Lt Pressure (mmHg)IndexWaveform Comment +---------+------------------+-----+---------+-------+ Brachial 134                    triphasic        +---------+------------------+-----+---------+-------+ PTA      152               1.08 triphasic        +---------+------------------+-----+---------+-------+ DP       160               1.13 triphasic        +---------+------------------+-----+---------+-------+ Great Toe89                0.63                  +---------+------------------+-----+---------+-------+  Summary: Right: Resting right ankle-brachial index is within normal range. The right toe-brachial index is mildly abnormal. Left: Resting left ankle-brachial index is within normal range. The left toe-brachial index is mildly abnormal. *See table(s) above for measurements and  observations.  Electronically signed by Orlie Pollen on 08/02/2022 at 5:37:06 PM.    Final     LIPID PANEL:     Component Value Date/Time   CHOL 127 07/26/2022 0835   TRIG 256 (H) 07/26/2022 0835   HDL 22 (L) 07/26/2022 0835   CHOLHDL 5.8 (H) 07/26/2022 0835   CHOLHDL 5 07/20/2022 1017   VLDL 40.8 (H) 07/20/2022 1017   LDLCALC 63 07/26/2022 0835   [place]  07/23/2020 0827     Comment:     . LDL cholesterol not calculated. Triglyceride levels greater than 400 mg/dL invalidate calculated LDL results. . Reference range: <100 . Desirable range <100 mg/dL for primary prevention;   <70 mg/dL for patients with CHD or diabetic patients  with > or = 2 CHD risk factors. Marland Kitchen LDL-C is now calculated using the Martin-Hopkins  calculation, which is a validated novel method providing  better accuracy than the Friedewald equation in the  estimation of LDL-C.  Cresenciano Genre et al. Annamaria Helling. 7106;269(48): 2061-2068  (http://education.[employer].com/faq/FAQ164)    LDLDIRECT 55.0 07/20/2022 1017    WEIGHTS: Wt Readings from Last 3 Encounters:  08/03/22 178 lb (80.7 kg)  07/27/22 177 lb 3.2 oz (80.4 kg)  07/27/22 175 lb 14.4 oz (79.8 kg)    VITALS: BP (!) 140/64   Pulse 73   Ht _0  (1.727 m)   Wt 178 lb (80.7 kg)   BMI 27.06 kg/m   EXAM: General appearance: alert and no distress Neck: no carotid bruit, no JVD, and thyroid not enlarged, symmetric, no tenderness/mass/nodules Lungs: clear to auscultation bilaterally Heart: regular rate and rhythm, S1, S2 normal, no murmur, click, rub or gallop Abdomen: soft, non-tender; bowel sounds normal; no masses,  no organomegaly Extremities: extremities normal, atraumatic, no cyanosis or edema and no sores, normal joint discrimination, intact light touch, warm, well-perfused Pulses: 2+ and symmetric Skin: Skin color, texture, turgor normal. 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   Mr. Prude seems to be doing well after recent lobectomy for a lung cancer.  He is now seeing oncology.  His lipids are reasonably well-controlled with LDL at target less than 70.  Triglycerides were mildly elevated.  A1c has improved down to 6.4%.  Unfortunately, he could not tolerate Jardiance due to infection and therefore it was discontinued.  Blood pressure is somewhat elevated.  He says this is about his baseline reading.  He was noted to have some recent microalbuminuria.  I would advise tighter blood pressure control and will increase his losartan from 25 to 50 mg daily.  Follow-up with me in 6 months or sooner as necessary.  Pixie Casino, MD, [facility], [place] Director of the Advanced Lipid Disorders &  Cardiovascular Risk Reduction Clinic Diplomate of the American Board of Clinical Lipidology Attending Cardiologist  Direct Dial: [phone]  Fax: [phone]  Website:  [email or web address] Limon 08/03/2022, 12:50 PM

## 2022-08-03 NOTE — Patient Instructions (Signed)
Medication Instructions:   INCREASE losartan to 50mg  daily   *If you need a refill on your cardiac medications before your next appointment, please call your pharmacy*   Lab Work:  FASTING labs in 6 months -- lipid panel, A1c    Follow-Up: At [facility], you and your health needs are our priority.  As part of our continuing mission to provide you with exceptional heart care, we have created designated Provider Care Teams.  These Care Teams include your primary Cardiologist (physician) and Advanced Practice Providers (APPs -  Physician Assistants and Nurse Practitioners) who all work together to provide you with the care you need, when you need it.  We recommend signing up for the patient portal called "MyChart".  Sign up information is provided on this After Visit Summary.  MyChart is used to connect with patients for Virtual Visits (Telemedicine).  Patients are able to view lab/test results, encounter notes, upcoming appointments, etc.  Non-urgent messages can be sent to your provider as well.   To learn more about what you can do with MyChart, go to [email or web address].    Your next appointment:   6 month(s)  The format for your next appointment:   In Person  Provider:   Lyman Bishop MD

## 2022-08-05 ENCOUNTER — Ambulatory Visit (INDEPENDENT_AMBULATORY_CARE_PROVIDER_SITE_OTHER): Payer: PPO

## 2022-08-05 DIAGNOSIS — Z0000 Encounter for general adult medical examination without abnormal findings: Secondary | ICD-10-CM | POA: Diagnosis not present

## 2022-08-05 NOTE — Progress Notes (Signed)
Please inform patient of the following:  He has mild decreased blood flow in both of his feet.  This could explain some of his symptoms.  Recommend referral to vascular surgery if he is agreeable.

## 2022-08-05 NOTE — Progress Notes (Signed)
I connected with  Celso Sickle on 08/05/22 by a audio enabled telemedicine application and verified that I am speaking with the correct person using two identifiers.  Patient Location: Home  Provider Location: Office/Clinic  I discussed the limitations of evaluation and management by telemedicine. The patient expressed understanding and agreed to proceed.   Subjective:   Gregory Jensen is a 66 y.o. male who presents for Medicare Annual/Subsequent preventive examination.  Review of Systems     Cardiac Risk Factors include: advanced age (>73mn, >>51women);male gender;dyslipidemia;smoking/ tobacco exposure     Objective:    There were no vitals filed for this visit. There is no height or weight on file to calculate BMI.     08/05/2022    8:39 AM 05/11/2022   10:00 PM 05/06/2022    1:04 PM 05/14/2021    1:21 PM 03/01/2016    7:22 AM 02/16/2016    4:38 PM  Advanced Directives  Does Patient Have a Medical Advance Directive? Yes _0   Type of [place]       Copy of H[place] Chart? No - copy requested       Would patient like information on creating a medical advance directive?  No - Patient declined No - Patient declined No - Patient declined      Current Medications (verified) Outpatient Encounter Medications as of 08/05/2022  Medication Sig   aspirin 81 MG EC tablet Take 81 mg by mouth in the morning.   atorvastatin (LIPITOR) 40 MG tablet Take 1 tablet (40 mg total) by mouth at bedtime.   Blood Glucose Monitoring Suppl (TRUE METRIX METER) w/Device KIT USE AS DIRECTED   fenofibrate (TRICOR) 145 MG tablet Take 1 tablet (145 mg total) by mouth daily.   glucose blood (ONETOUCH VERIO) test strip 1 each by Other route daily as needed for other. Use as instructed   ibuprofen (ADVIL) 200 MG tablet Take 400-600 mg by mouth daily as needed (pain.).   losartan (COZAAR) 50 MG tablet Take 1 tablet  (50 mg total) by mouth daily.   metFORMIN (GLUCOPHAGE) 500 MG tablet TAKE 1 TABLET BY MOUTH 2 TIMES DAILY WITH A MEAL.   OneTouch Delica Lancets 354SMISC 1 each by Does not apply route daily as needed.   No facility-administered encounter medications on file as of 08/05/2022.    Allergies (verified) Jardiance [empagliflozin]   History: Past Medical History:  Diagnosis Date   [place]cer ([place] 4 years ago   Prostate Cancer    Diabetes mellitus without complication ([place]    type 2   Hyperlipidemia    Neuropathy    in legs causing balance issues per patient   Sleep apnea    Smoker    Past Surgical History:  Procedure Laterality Date   BACK SURGERY  15 years ago   BRONCHIAL BIOPSY  05/10/2022   Procedure: BRONCHIAL BIOPSIES;  Surgeon: BCollene Gobble MD;  [facility];  Service: Pulmonary;;   BRONCHIAL BRUSHINGS  05/10/2022   Procedure: BRONCHIAL BRUSHINGS;  Surgeon: BCollene Gobble MD;  [facility];  Service: Pulmonary;;   BRONCHIAL NEEDLE ASPIRATION BIOPSY  05/10/2022   Procedure: BRONCHIAL NEEDLE ASPIRATION BIOPSIES;  Surgeon: BCollene Gobble MD;  Location: [facility] ENDOSCOPY;  Service: Pulmonary;;   COLONOSCOPY  2007 (at age 66   pt does not know MD name/normal exam per pt.   FIDUCIAL MARKER PLACEMENT  05/10/2022  Procedure: FIDUCIAL DYE MARKING;  Surgeon: Collene Gobble, MD;  [facility];  Service: Pulmonary;;   INTERCOSTAL NERVE BLOCK Right 05/10/2022   Procedure: INTERCOSTAL NERVE BLOCK;  Surgeon: Lajuana Matte, MD;  Location: [place];  Service: Thoracic;  Laterality: Right;   NODE DISSECTION Right 05/10/2022   Procedure: NODE DISSECTION;  Surgeon: Lajuana Matte, MD;  Location: [place];  Service: Thoracic;  Laterality: Right;   PROSTATECTOMY  4 years ago   Family History  Problem Relation Age of Onset   Alzheimer's disease Mother    Heart disease Father    Hypertension Sister    Heart disease Sister    Hypertension Sister    Stomach  cancer Brother    Diabetes Other        family hx   Colon cancer Neg Hx    Social History   Socioeconomic History   Marital status: Significant Other    Spouse name: [name]   Number of children: 1   Years of education: 12   Highest education level: Not on file  Occupational History   Occupation: [employer]    Comment: Retired  Tobacco Use   Smoking status: Every Day    Packs/day: 1.00    Years: 47.00    Total pack years: 47.00    Types: Cigarettes   Smokeless tobacco: Never   Tobacco comments:    1 pack of cigarettes smoked daily ARJ 07/27/22  Vaping Use   Vaping Use: Never used  Substance and Sexual Activity   Alcohol use: No    Alcohol/week: 0.0 standard drinks of alcohol   Drug use: No   Sexual activity: Not on file  Other Topics Concern   Not on file  Social History Narrative   Lives with wife. Has one son who lives in [place].    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     Social Determinants of Health   Financial Resource Strain: Low Risk  (08/05/2022)   Overall Financial Resource Strain (CARDIA)    Difficulty of Paying Living Expenses: Not hard at all  Food Insecurity: No Food Insecurity (08/05/2022)   Hunger Vital Sign    Worried About Running Out of Food in the Last Year: Never true    Ran Out of Food in the Last Year: Never true  Transportation Needs: No Transportation Needs (08/05/2022)   PRAPARE - Hydrologist (Medical): No    Lack of Transportation (Non-Medical): No  Physical Activity: Sufficiently Active (08/05/2022)   Exercise Vital Sign    Days of Exercise per Week: 7 days    Minutes of Exercise per Session: 30 min  Stress: No Stress Concern Present (08/05/2022)   [place]    Feeling of Stress : Not at all  Social Connections: Moderately Isolated (08/05/2022)   Social Connection and Isolation Panel [NHANES]     Frequency of Communication with Friends and Family: Twice a week    Frequency of Social Gatherings with Friends and Family: Twice a week    Attends Religious Services: Never    Marine scientist or Organizations: No    Attends Music therapist: Never    Marital Status: Living with partner    Tobacco Counseling Ready to quit: Not Answered Counseling given: Not Answered Tobacco comments: 1 pack of cigarettes smoked daily ARJ 07/27/22   Clinical Intake:  Pre-visit  preparation completed: Yes  Pain : No/denies pain     BMI - recorded: 27.06 Nutritional Status: BMI 25 -29 Overweight Nutritional Risks: None Diabetes: Yes CBG done?: No Did pt. bring in CBG monitor from home?: No  How often do you need to have someone help you when you read instructions, pamphlets, or other written materials from your doctor or pharmacy?: 1 - Never  Diabetic?Nutrition Risk Assessment:  Has the patient had any N/V/D within the last 2 months?  No  Does the patient have any non-healing wounds?  No  Has the patient had any unintentional weight loss or weight gain?  No   Diabetes:  Is the patient diabetic?  Yes  If diabetic, was a CBG obtained today?  No  Did the patient bring in their glucometer from home?  No  How often do you monitor your CBG's? Daily .   Financial Strains and Diabetes Management:  Are you having any financial strains with the device, your supplies or your medication? No .  Does the patient want to be seen by Chronic Care Management for management of their diabetes?  No  Would the patient like to be referred to a Nutritionist or for Diabetic Management?  No   Diabetic Exams:  Diabetic Eye Exam: Completed pt stated 07/2022 Diabetic Foot Exam: Overdue, Pt has been advised about the importance in completing this exam. Pt is scheduled for diabetic foot exam on next .   Interpreter Needed?: No  Information entered by :: Charlott Rakes,  LPN   Activities of Daily Living    08/05/2022    8:41 AM 05/11/2022   10:00 PM  In your present state of health, do you have any difficulty performing the following activities:  Hearing? 0 1  Vision? 0 0  Difficulty concentrating or making decisions? 0 0  Walking or climbing stairs? 1 1  Comment due to neuropathy   Dressing or bathing? 0 0  Doing errands, shopping? 0 0  Preparing Food and eating ? N   Using the Toilet? N   In the past six months, have you accidently leaked urine? N   Do you have problems with loss of bowel control? N   Managing your Medications? N   Managing your Finances? N   Housekeeping or managing your Housekeeping? N     Patient Care Team: Vivi Barrack, MD as PCP - General (Family Medicine)  Indicate any recent Medical Services you may have received from other than [facility] providers in the past year (date may be approximate).     Assessment:   This is a routine wellness examination for Dylann.  Hearing/Vision screen Hearing Screening - Comments:: Pt stated slight loss of hearing  Vision Screening - Comments:: Pt follows up with Dr Joya San for annual eye exams   Dietary issues and exercise activities discussed: Current Exercise Habits: Home exercise routine, Type of exercise: walking, Time (Minutes): 30, Frequency (Times/Week): 7, Weekly Exercise (Minutes/Week): 210   Goals Addressed             This Visit's Progress    Patient Stated       Stop smoking        Depression Screen    08/05/2022    8:38 AM 07/20/2022    9:31 AM 02/17/2022    9:16 AM 07/28/2021    8:52 AM 05/14/2021    1:22 PM 05/14/2021    1:18 PM 01/22/2021    8:31 AM  PHQ 2/9 Scores  PHQ -  2 Score 0 0 0 0 1 1 0    Fall Risk    08/05/2022    8:41 AM 07/20/2022    9:30 AM 02/17/2022    9:16 AM 07/28/2021    8:51 AM 05/14/2021    1:22 PM  [place] in the past year? 0 0 0 0 0  Number falls in past yr: 0 0 0 0 0  Injury with Fall? 0 0 0 0 0  Risk for fall  due to : Impaired balance/gait;Impaired vision;Impaired mobility  No Fall Risks  No Fall Risks  Follow up Falls prevention discussed        FALL RISK PREVENTION PERTAINING TO THE HOME:  Any stairs in or around the home? Yes  If so, are there any without handrails? No  Home free of loose throw rugs in walkways, pet beds, electrical cords, etc? Yes  Adequate lighting in your home to reduce risk of falls? Yes   ASSISTIVE DEVICES UTILIZED TO PREVENT FALLS:  Life alert? No  Use of a cane, walker or w/c? Yes  Grab bars in the bathroom? No  Shower chair or bench in shower? Yes  Elevated toilet seat or a handicapped toilet? No   TIMED UP AND GO:  Was the test performed? No .    Cognitive Function:        08/05/2022    8:42 AM 05/14/2021    1:24 PM  6CIT Screen  What Year? 0 points 0 points  What month? 0 points 0 points  What time? 0 points 0 points  Count back from 20 0 points 0 points  Months in reverse 0 points 0 points  Repeat phrase 2 points 0 points  Total Score 2 points 0 points    Immunizations Immunization History  Administered Date(s) Administered   PFIZER Comirnaty(Gray Top)Covid-19 Tri-Sucrose Vaccine 09/12/2020   PFIZER(Purple Top)SARS-COV-2 Vaccination 03/20/2020, 04/10/2020   Tdap 12/19/2013    TDAP status: Up to date  Flu Vaccine status: Declined, Education has been provided regarding the importance of this vaccine but patient still declined. Advised may receive this vaccine at local pharmacy or Health Dept. Aware to provide a copy of the vaccination record if obtained from local pharmacy or Health Dept. Verbalized acceptance and understanding.  Pneumococcal vaccine status: Declined,  Education has been provided regarding the importance of this vaccine but patient still declined. Advised may receive this vaccine at local pharmacy or Health Dept. Aware to provide a copy of the vaccination record if obtained from local pharmacy or Health Dept. Verbalized  acceptance and understanding.   Covid-19 vaccine status: Completed vaccines  Qualifies for Shingles Vaccine? Yes   Zostavax completed No   Shingrix Completed?: No.    Education has been provided regarding the importance of this vaccine. Patient has been advised to call insurance company to determine out of pocket expense if they have not yet received this vaccine. Advised may also receive vaccine at local pharmacy or Health Dept. Verbalized acceptance and understanding.  Screening Tests Health Maintenance  Topic Date Due   Pneumonia Vaccine 3+ Years old (1 - PCV) Never done   Zoster Vaccines- Shingrix (1 of 2) Never done   FOOT EXAM  01/18/2020   OPHTHALMOLOGY EXAM  10/14/2020   COLONOSCOPY (Pts 45-13yr Insurance coverage will need to be confirmed)  03/01/2021   INFLUENZA VACCINE  11/14/2022 (Originally 03/16/2022)   HEMOGLOBIN A1C  01/25/2023   Diabetic kidney evaluation - eGFR measurement  07/21/2023  Diabetic kidney evaluation - Urine ACR  07/21/2023   Medicare Annual Wellness (AWV)  08/06/2023   DTaP/Tdap/Td (2 - Td or Tdap) 12/20/2023   Hepatitis C Screening  Completed   HPV VACCINES  Aged Out   Lung Cancer Screening  Discontinued   COVID-19 Vaccine  Discontinued    Health Maintenance  Health Maintenance Due  Topic Date Due   Pneumonia Vaccine 46+ Years old (1 - PCV) Never done   Zoster Vaccines- Shingrix (1 of 2) Never done   FOOT EXAM  01/18/2020   OPHTHALMOLOGY EXAM  10/14/2020   COLONOSCOPY (Pts 45-5yr Insurance coverage will need to be confirmed)  03/01/2021    Colorectal cancer screening: Type of screening: Colonoscopy. Completed 03/01/16. Repeat every 5 years pt stated he's due 2025  Additional Screening:  Hepatitis C Screening:  Completed 01/16/18  Vision Screening: Recommended annual ophthalmology exams for early detection of glaucoma and other disorders of the eye. Is the patient up to date with their annual eye exam?  Yes  Who is the provider or what is  the name of the office in which the patient attends annual eye exams? Dr TJoya San If pt is not established with a provider, would they like to be referred to a provider to establish care? No .   Dental Screening: Recommended annual dental exams for proper oral hygiene  Community Resource Referral / Chronic Care Management: CRR required this visit?  No   CCM required this visit?  No      Plan:     I have personally reviewed and noted the following in the patient's chart:   Medical and social history Use of alcohol, tobacco or illicit drugs  Current medications and supplements including opioid prescriptions. Patient is not currently taking opioid prescriptions. Functional ability and status Nutritional status Physical activity Advanced directives List of other physicians Hospitalizations, surgeries, and ER visits in previous 12 months Vitals Screenings to include cognitive, depression, and falls Referrals and appointments  In addition, I have reviewed and discussed with patient certain preventive protocols, quality metrics, and best practice recommendations. A written personalized care plan for preventive services as well as general preventive health recommendations were provided to patient.     TWillette Brace LPN   [id number]  Nurse Notes: none

## 2022-08-05 NOTE — Patient Instructions (Signed)
Mr. Gregory Jensen , Thank you for taking time to come for your Medicare Wellness Visit. I appreciate your ongoing commitment to your health goals. Please review the following plan we discussed and let me know if I can assist you in the future.   These are the goals we discussed:  Goals      Patient Stated     Stop smoking         This is a list of the screening recommended for you and due dates:  Health Maintenance  Topic Date Due   Pneumonia Vaccine (1 - PCV) Never done   Zoster (Shingles) Vaccine (1 of 2) Never done   Complete foot exam   01/18/2020   Eye exam for diabetics  10/14/2020   Colon Cancer Screening  03/01/2021   Flu Shot  11/14/2022*   Hemoglobin A1C  01/25/2023   Yearly kidney function blood test for diabetes  07/21/2023   Yearly kidney health urinalysis for diabetes  07/21/2023   Medicare Annual Wellness Visit  08/06/2023   DTaP/Tdap/Td vaccine (2 - Td or Tdap) 12/20/2023   Hepatitis C Screening: USPSTF Recommendation to screen - Ages 25-79 yo.  Completed   HPV Vaccine  Aged Out   Screening for Lung Cancer  Discontinued   COVID-19 Vaccine  Discontinued  *Topic was postponed. The date shown is not the original due date.    Advanced directives: Please bring a copy of your health care power of attorney and living will to the office at your convenience.  Conditions/risks identified: stop smoking   Next appointment: Follow up in one year for your annual wellness visit.   Preventive Care 67 Years and Older, Male  Preventive care refers to lifestyle choices and visits with your health care provider that can promote health and wellness. What does preventive care include? A yearly physical exam. This is also called an annual well check. Dental exams once or twice a year. Routine eye exams. Ask your health care provider how often you should have your eyes checked. Personal lifestyle choices, including: Daily care of your teeth and gums. Regular physical  activity. Eating a healthy diet. Avoiding tobacco and drug use. Limiting alcohol use. Practicing safe sex. Taking low doses of aspirin every day. Taking vitamin and mineral supplements as recommended by your health care provider. What happens during an annual well check? The services and screenings done by your health care provider during your annual well check will depend on your age, overall health, lifestyle risk factors, and family history of disease. Counseling  Your health care provider may ask you questions about your: Alcohol use. Tobacco use. Drug use. Emotional well-being. Home and relationship well-being. Sexual activity. Eating habits. History of falls. Memory and ability to understand (cognition). Work and work Statistician. Screening  You may have the following tests or measurements: Height, weight, and BMI. Blood pressure. Lipid and cholesterol levels. These may be checked every 5 years, or more frequently if you are over 80 years old. Skin check. Lung cancer screening. You may have this screening every year starting at age 69 if you have a 30-pack-year history of smoking and currently smoke or have quit within the past 15 years. Fecal occult blood test (FOBT) of the stool. You may have this test every year starting at age 15. Flexible sigmoidoscopy or colonoscopy. You may have a sigmoidoscopy every 5 years or a colonoscopy every 10 years starting at age 68. Prostate cancer screening. Recommendations will vary depending on your family history  and other risks. Hepatitis C blood test. Hepatitis B blood test. Sexually transmitted disease (STD) testing. Diabetes screening. This is done by checking your blood sugar (glucose) after you have not eaten for a while (fasting). You may have this done every 1-3 years. Abdominal aortic aneurysm (AAA) screening. You may need this if you are a current or former smoker. Osteoporosis. You may be screened starting at age 9 if you are  at high risk. Talk with your health care provider about your test results, treatment options, and if necessary, the need for more tests. Vaccines  Your health care provider may recommend certain vaccines, such as: Influenza vaccine. This is recommended every year. Tetanus, diphtheria, and acellular pertussis (Tdap, Td) vaccine. You may need a Td booster every 10 years. Zoster vaccine. You may need this after age 42. Pneumococcal 13-valent conjugate (PCV13) vaccine. One dose is recommended after age 48. Pneumococcal polysaccharide (PPSV23) vaccine. One dose is recommended after age 17. Talk to your health care provider about which screenings and vaccines you need and how often you need them. This information is not intended to replace advice given to you by your health care provider. Make sure you discuss any questions you have with your health care provider. Document Released: 08/29/2015 Document Revised: 04/21/2016 Document Reviewed: 06/03/2015 Elsevier Interactive Patient Education  2017 [place] Prevention in the Home Falls can cause injuries. They can happen to people of all ages. There are many things you can do to make your home safe and to help prevent falls. What can I do on the outside of my home? Regularly fix the edges of walkways and driveways and fix any cracks. Remove anything that might make you trip as you walk through a door, such as a raised step or threshold. Trim any bushes or trees on the path to your home. Use bright outdoor lighting. Clear any walking paths of anything that might make someone trip, such as rocks or tools. Regularly check to see if handrails are loose or broken. Make sure that both sides of any steps have handrails. Any raised decks and porches should have guardrails on the edges. Have any leaves, snow, or ice cleared regularly. Use sand or salt on walking paths during winter. Clean up any spills in your garage right away. This includes oil  or grease spills. What can I do in the bathroom? Use night lights. Install grab bars by the toilet and in the tub and shower. Do not use towel bars as grab bars. Use non-skid mats or decals in the tub or shower. If you need to sit down in the shower, use a plastic, non-slip stool. Keep the floor dry. Clean up any water that spills on the floor as soon as it happens. Remove soap buildup in the tub or shower regularly. Attach bath mats securely with double-sided non-slip rug tape. Do not have throw rugs and other things on the floor that can make you trip. What can I do in the bedroom? Use night lights. Make sure that you have a light by your bed that is easy to reach. Do not use any sheets or blankets that are too big for your bed. They should not hang down onto the floor. Have a firm chair that has side arms. You can use this for support while you get dressed. Do not have throw rugs and other things on the floor that can make you trip. What can I do in the kitchen? Clean up any spills  right away. Avoid walking on wet floors. Keep items that you use a lot in easy-to-reach places. If you need to reach something above you, use a strong step stool that has a grab bar. Keep electrical cords out of the way. Do not use floor polish or wax that makes floors slippery. If you must use wax, use non-skid floor wax. Do not have throw rugs and other things on the floor that can make you trip. What can I do with my stairs? Do not leave any items on the stairs. Make sure that there are handrails on both sides of the stairs and use them. Fix handrails that are broken or loose. Make sure that handrails are as long as the stairways. Check any carpeting to make sure that it is firmly attached to the stairs. Fix any carpet that is loose or worn. Avoid having throw rugs at the top or bottom of the stairs. If you do have throw rugs, attach them to the floor with carpet tape. Make sure that you have a light  switch at the top of the stairs and the bottom of the stairs. If you do not have them, ask someone to add them for you. What else can I do to help prevent falls? Wear shoes that: Do not have high heels. Have rubber bottoms. Are comfortable and fit you well. Are closed at the toe. Do not wear sandals. If you use a stepladder: Make sure that it is fully opened. Do not climb a closed stepladder. Make sure that both sides of the stepladder are locked into place. Ask someone to hold it for you, if possible. Clearly mark and make sure that you can see: Any grab bars or handrails. First and last steps. Where the edge of each step is. Use tools that help you move around (mobility aids) if they are needed. These include: Canes. Walkers. Scooters. Crutches. Turn on the lights when you go into a dark area. Replace any light bulbs as soon as they burn out. Set up your furniture so you have a clear path. Avoid moving your furniture around. If any of your floors are uneven, fix them. If there are any pets around you, be aware of where they are. Review your medicines with your doctor. Some medicines can make you feel dizzy. This can increase your chance of falling. Ask your doctor what other things that you can do to help prevent falls. This information is not intended to replace advice given to you by your health care provider. Make sure you discuss any questions you have with your health care provider. Document Released: 05/29/2009 Document Revised: 01/08/2016 Document Reviewed: 09/06/2014 Elsevier Interactive Patient Education  2017 [employer].

## 2022-08-11 ENCOUNTER — Other Ambulatory Visit: Payer: Self-pay | Admitting: *Deleted

## 2022-08-11 DIAGNOSIS — R209 Unspecified disturbances of skin sensation: Secondary | ICD-10-CM

## 2022-08-16 NOTE — Progress Notes (Unsigned)
[facility]ASC[facility]ISTS OF [place]  ASSESSMENT / PLAN: 67 y.o. male with cool feet bilaterally. ABI suggested low-normal toe pressures bilaterally. Patient does *** have symptoms of peripheral arterial disease. Raynaud's ***.   Recommend: Complete cessation from all tobacco products. Blood glucose control with goal A1c < 7%. Blood pressure control with goal blood pressure < 140/90 mmHg. Lipid reduction therapy with goal LDL-C <100 mg/dL (<70 if symptomatic from PAD).  Aspirin 89m PO QD.  Atorvastatin 40-80mg PO QD (or other "high intensity" statin therapy).    CHIEF COMPLAINT: ***  HISTORY OF PRESENT ILLNESS: Gregory TCHARVEZ VOORHIESis a 67y.o. male ***  VASCULAR SURGICAL HISTORY: ***  VASCULAR RISK FACTORS: {FINDINGS; POSITIVE NEGATIVE:986 723 6013} history of stroke / transient ischemic attack. {FINDINGS; POSITIVE NEGATIVE:986 723 6013} history of coronary artery disease. *** history of PCI. *** history of CABG.  {FINDINGS; POSITIVE NEGATIVE:986 723 6013} history of diabetes mellitus. Last A1c ***. {FINDINGS; POSITIVE NEGATIVE:986 723 6013} history of smoking. *** actively smoking. {FINDINGS; POSITIVE NEGATIVE:986 723 6013} history of hypertension. *** drug regimen with *** control. {FINDINGS; POSITIVE NEGATIVE:986 723 6013} history of chronic kidney disease.  Last GFR ***. CKD {stage:30421363}. {FINDINGS; POSITIVE NEGATIVE:986 723 6013} history of chronic obstructive pulmonary disease, treated with ***.  FUNCTIONAL STATUS: ECOG performance status: {findings; ecog performance status:31780} Ambulatory status: {TNHAmbulation:25868}  CAREY 1 AND 3 YEAR INDEX Male (2pts) 75-79 or 80-84 (2pts) >84 (3pts) Dependence in toileting (1pt) Partial or full dependence in dressing (1pt) History of malignant neoplasm (2pts) CHF (3pts) COPD (1pts) CKD (3pts)  0-3 pts 6% 1 year mortality ; 21% 3 year mortality 4-5 pts 12% 1 year mortality ; 36% 3 year mortality >5 pts 21% 1 year mortality;  54% 3 year mortality   Past Medical History:  Diagnosis Date   [place]   Prostate Cancer    Diabetes mellitus without complication (HCC)    type 2   Hyperlipidemia    Neuropathy    in legs causing balance issues per patient   Sleep apnea    Smoker     Past Surgical History:  Procedure Laterality Date   BACK SURGERY  15 years ago   BRONCHIAL BIOPSY  05/10/2022   Procedure: BRONCHIAL BIOPSIES;  Surgeon: BCollene Gobble MD;  [facility];  Service: Pulmonary;;   BRONCHIAL BRUSHINGS  05/10/2022   Procedure: BRONCHIAL BRUSHINGS;  Surgeon: BCollene Gobble MD;  [facility];  Service: Pulmonary;;   BRONCHIAL NEEDLE ASPIRATION BIOPSY  05/10/2022   Procedure: BRONCHIAL NEEDLE ASPIRATION BIOPSIES;  Surgeon: BCollene Gobble MD;  [facility];  Service: Pulmonary;;   COLONOSCOPY  2007 (at age 67   pt does not know MD name/normal exam per pt.   FIDUCIAL MARKER PLACEMENT  05/10/2022   Procedure: FIDUCIAL DYE MARKING;  Surgeon: BCollene Gobble MD;  [facility];  Service: Pul[facility];;   INTERCOSTAL NERVE BLOCK Right 05/10/2022   Procedure: INTERCOSTAL NERVE BLOCK;  Surgeon: LLajuana Matte MD;  [place]  Service: Thoracic;  Laterality: Right;   NODE DISSECTION Right 05/10/2022   Procedure: NODE DISSECTION;  Surgeon: LLajuana Matte MD;  [facility];  Service: Thoracic;  Laterality: Right;   PROSTATECTOMY  4 years ago    Family History  Problem Relation Age of Onset   Alzheimer's disease Mother    Heart disease Father    Hypertension Sister    Heart disease Sister    Hypertension Sister    Stomach cancer Brother    Diabetes Other  family hx   Colon cancer Neg Hx     Social History   Socioeconomic History   Marital status: Significant Other    Spouse name: Gregory Jensen   Number of children: 1   Years of education: 12   Highest education level: Not on file  Occupational History   Occupation:  [employer]    Comment: Retired  Tobacco Use   Smoking status: Every Day    Packs/day: 1.00    Years: 47.00    Total pack years: 47.00    Types: Cigarettes   Smokeless tobacco: Never   Tobacco comments:    1 pack of cigarettes smoked daily ARJ 07/27/22  Vaping Use   Vaping Use: Never used  Substance and Sexual Activity   Alcohol use: No    Alcohol/week: 0.0 standard drinks of alcohol   Drug use: No   Sexual activity: Not on file  Other Topics Concern   Not on file  Social History Narrative   Lives with wife. Has one son who lives in [place].    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     Social Determinants of Health   Financial Resource Strain: Low Risk  (08/05/2022)   Overall Financial Resource Strain (CARDIA)    Difficulty of Paying Living Expenses: Not hard at all  Food Insecurity: No Food Insecurity (08/05/2022)   Hunger Vital Sign    Worried About Running Out of Food in the Last Year: Never true    Ran Out of Food in the Last Year: Never true  Transportation Needs: No Transportation Needs (08/05/2022)   PRAPARE - Hydrologist (Medical): No    Lack of Transportation (Non-Medical): No  Physical Activity: Sufficiently Active (08/05/2022)   Exercise Vital Sign    Days of Exercise per Week: 7 days    Minutes of Exercise per Session: 30 min  Stress: No Stress Concern Present (08/05/2022)   [place]    Feeling of Stress : Not at all  Social Connections: Moderately Isolated (08/05/2022)   Social Connection and Isolation Panel [NHANES]    Frequency of Communication with Friends and Family: Twice a week    Frequency of Social Gatherings with Friends and Family: Twice a week    Attends Religious Services: Never    Marine scientist or Organizations: No    Attends Archivist Meetings: Never    Marital Status: Living with  partner  Intimate Partner Violence: Not At Risk (08/05/2022)   Humiliation, Afraid, Rape, and Kick questionnaire    Fear of Current or Ex-Partner: No    Emotionally Abused: No    Physically Abused: No    Sexually Abused: No    Allergies  Allergen Reactions   Jardiance [Empagliflozin] Other (See Comments)    Candidal balanitis    Current Outpatient Medications  Medication Sig Dispense Refill   aspirin 81 MG EC tablet Take 81 mg by mouth in the morning.     atorvastatin (LIPITOR) 40 MG tablet Take 1 tablet (40 mg total) by mouth at bedtime.     Blood Glucose Monitoring Suppl (TRUE METRIX METER) w/Device KIT USE AS DIRECTED 1 kit 0   fenofibrate (TRICOR) 145 MG tablet Take 1 tablet (145 mg total) by mouth daily. 90 tablet 3   glucose blood (ONETOUCH VERIO) test strip 1 each by Other route daily as needed for other. Use  as instructed 100 each 4   ibuprofen (ADVIL) 200 MG tablet Take 400-600 mg by mouth daily as needed (pain.).     losartan (COZAAR) 50 MG tablet Take 1 tablet (50 mg total) by mouth daily. 90 tablet 3   metFORMIN (GLUCOPHAGE) 500 MG tablet TAKE 1 TABLET BY MOUTH 2 TIMES DAILY WITH A MEAL. 180 tablet 1   OneTouch Delica Lancets 16X MISC 1 each by Does not apply route daily as needed. 100 each 4   No current facility-administered medications for this visit.    PHYSICAL EXAM There were no vitals filed for this visit.  Constitutional: *** appearing. *** distress. Appears *** nourished.  Neurologic: CN ***. *** focal findings. *** sensory loss. Psychiatric: *** Mood and affect symmetric and appropriate. Eyes: *** No icterus. No conjunctival pallor. Ears, nose, throat: *** mucous membranes moist. Midline trachea.  Cardiac: *** rate and rhythm.  Respiratory: *** unlabored. Abdominal: *** soft, non-tender, non-distended.  Peripheral vascular: *** Extremity: *** edema. *** cyanosis. *** pallor.  Skin: *** gangrene. *** ulceration.  Lymphatic: *** Stemmer's sign. ***  palpable lymphadenopathy.    PERTINENT LABORATORY AND RADIOLOGIC DATA  Most recent CBC    Latest Ref Rng & Units 07/20/2022   10:17 AM 05/14/2022    1:04 AM 05/12/2022    1:56 AM  CBC  WBC 4.0 - 10.5 K/uL 7.8  7.4  8.1   Hemoglobin 13.0 - 17.0 g/dL 12.7  11.9  11.2   Hematocrit 39.0 - 52.0 % 37.2  34.3  33.1   Platelets 150.0 - 400.0 K/uL 273.0  164  144      Most recent CMP    Latest Ref Rng & Units 07/20/2022   10:17 AM 05/14/2022    1:04 AM 05/12/2022    1:56 AM  CMP  Glucose 70 - 99 mg/dL 109  148  143   BUN 6 - 23 mg/dL _0 Creatinine 0.40 - 1.50 mg/dL 0.92  1.08  1.27   Sodium 135 - 145 mEq/L 139  138  138   Potassium 3.5 - 5.1 mEq/L 4.1  3.9  3.8   Chloride 96 - 112 mEq/L 104  107  105   CO2 19 - 32 mEq/L _1 Calcium 8.4 - 10.5 mg/dL 9.4  9.4  8.8   Total Protein 6.0 - 8.3 g/dL 6.9   5.8   Total Bilirubin 0.2 - 1.2 mg/dL 0.4   0.7   Alkaline Phos 39 - 117 U/L 57   37   AST 0 - 37 U/L 13   25   ALT 0 - 53 U/L 11   15     Renal function CrCl cannot be calculated (Patient's most recent lab result is older than the maximum 21 days allowed.).  Hgb A1c MFr Bld (%)  Date Value  07/26/2022 6.4 (H)    LDL Cholesterol (Calc)  Date Value Ref Range Status  07/23/2020  mg/dL (calc) Final    Comment:    . LDL cholesterol not calculated. Triglyceride levels greater than 400 mg/dL invalidate calculated LDL results. . Reference range: <100 . Desirable range <100 mg/dL for primary prevention;   <70 mg/dL for patients with CHD or diabetic patients  with > or = 2 CHD risk factors. Marland Kitchen LDL-C is now calculated using the Martin-Hopkins  calculation, which is a validated novel method providing  better accuracy than the Friedewald equation in the  estimation of  LDL-C.  Cresenciano Genre et al. Annamaria Helling. 1914;782(95): 2061-2068  (http://education.Quest[employer].com/faq/FAQ164)    LDL Chol Calc (NIH)  Date Value Ref Range Status  07/26/2022 63 0 - 99 mg/dL Final    Direct LDL  Date Value Ref Range Status  07/20/2022 55.0 mg/dL Final    Comment:    Optimal:  <100 mg/dLNear or Above Optimal:  100-129 mg/dLBorderline High:  130-159 mg/dLHigh:  160-189 mg/dLVery High:  >190 mg/dL     Vascular Imaging: ***  Yareliz Thorstenson N. Stanford Breed, MD [facility] Phone Number: [phone] 08/16/2022 12:07 PM   Total time spent on preparing this encounter including chart review, data review, collecting history, examining the patient, coordinating care for this {tnhtimebilling:26202}  Portions of this report may have been transcribed using voice recognition software.  Every effort has been made to ensure accuracy; however, inadvertent computerized transcription errors may still be present.

## 2022-08-17 ENCOUNTER — Encounter: Payer: Self-pay | Admitting: Vascular Surgery

## 2022-08-17 ENCOUNTER — Ambulatory Visit (INDEPENDENT_AMBULATORY_CARE_PROVIDER_SITE_OTHER): Payer: PPO | Admitting: Vascular Surgery

## 2022-08-17 VITALS — BP 126/68 | HR 77 | Temp 99.8°F | Resp 20 | Ht 68.0 in | Wt 176.0 lb

## 2022-08-17 DIAGNOSIS — E1151 Type 2 diabetes mellitus with diabetic peripheral angiopathy without gangrene: Secondary | ICD-10-CM

## 2022-12-19 ENCOUNTER — Other Ambulatory Visit: Payer: Self-pay | Admitting: Family Medicine

## 2023-01-06 ENCOUNTER — Other Ambulatory Visit (HOSPITAL_BASED_OUTPATIENT_CLINIC_OR_DEPARTMENT_OTHER): Payer: Self-pay | Admitting: Internal Medicine

## 2023-01-11 DIAGNOSIS — E119 Type 2 diabetes mellitus without complications: Secondary | ICD-10-CM | POA: Diagnosis not present

## 2023-01-11 DIAGNOSIS — E785 Hyperlipidemia, unspecified: Secondary | ICD-10-CM | POA: Diagnosis not present

## 2023-01-12 ENCOUNTER — Encounter: Payer: Self-pay | Admitting: Gastroenterology

## 2023-01-12 LAB — LIPID PANEL
Chol/HDL Ratio: 5 ratio (ref 0.0–5.0)
Cholesterol, Total: 109 mg/dL (ref 100–199)
HDL: 22 mg/dL — ABNORMAL LOW (ref >39)
LDL Chol Calc (NIH): 52 mg/dL (ref 0–99)
Triglycerides: 218 mg/dL — ABNORMAL HIGH (ref 0–149)
VLDL Cholesterol Cal: 35 mg/dL (ref 5–40)

## 2023-01-12 LAB — HEMOGLOBIN A1C
Est. average glucose Bld gHb Est-mCnc: 131 mg/dL
Hgb A1c MFr Bld: 6.2 % — ABNORMAL HIGH (ref 4.8–5.6)

## 2023-01-18 ENCOUNTER — Encounter (HOSPITAL_BASED_OUTPATIENT_CLINIC_OR_DEPARTMENT_OTHER): Payer: Self-pay | Admitting: Internal Medicine

## 2023-01-18 ENCOUNTER — Ambulatory Visit (INDEPENDENT_AMBULATORY_CARE_PROVIDER_SITE_OTHER): Payer: PPO | Admitting: Internal Medicine

## 2023-01-18 VITALS — BP 139/68 | HR 74 | Ht 68.0 in | Wt 179.4 lb

## 2023-01-18 DIAGNOSIS — E119 Type 2 diabetes mellitus without complications: Secondary | ICD-10-CM | POA: Diagnosis not present

## 2023-01-18 DIAGNOSIS — I2510 Atherosclerotic heart disease of native coronary artery without angina pectoris: Secondary | ICD-10-CM

## 2023-01-18 DIAGNOSIS — E785 Hyperlipidemia, unspecified: Secondary | ICD-10-CM

## 2023-01-18 DIAGNOSIS — I10 Essential (primary) hypertension: Secondary | ICD-10-CM

## 2023-01-18 DIAGNOSIS — Z7984 Long term (current) use of oral hypoglycemic drugs: Secondary | ICD-10-CM

## 2023-01-18 NOTE — Patient Instructions (Signed)
Medication Instructions:  NO CHANGES  *If you need a refill on your cardiac medications before your next appointment, please call your pharmacy*   Lab Work: FASTING lab work in 1 year  If you have labs (blood work) drawn today and your tests are completely normal, you will receive your results only by: MyChart Message (if you have MyChart) OR A paper copy in the mail If you have any lab test that is abnormal or we need to change your treatment, we will call you to review the results.    Follow-Up: At [facility], you and your health needs are our priority.  As part of our continuing mission to provide you with exceptional heart care, we have created designated Provider Care Teams.  These Care Teams include your primary Cardiologist (physician) and Advanced Practice Providers (APPs -  Physician Assistants and Nurse Practitioners) who all work together to provide you with the care you need, when you need it.  We recommend signing up for the patient portal called "MyChart".  Sign up information is provided on this After Visit Summary.  MyChart is used to connect with patients for Virtual Visits (Telemedicine).  Patients are able to view lab/test results, encounter notes, upcoming appointments, etc.  Non-urgent messages can be sent to your provider as well.   To learn more about what you can do with MyChart, go to [email or web address].    Your next appointment:    12 months with Dr. Hilty  

## 2023-01-18 NOTE — Progress Notes (Signed)
LIPID CLINIC CONSULT NOTE  Chief Complaint:  Follow-up dyslipidemia  Primary Care Physician: Ardith Dark, MD  Primary Cardiologist:  None  HPI:  BRODE OBENAUER is a 67 y.o. male who is being seen today for the evaluation of dyslipidemia at the request of Ardith Dark, MD. this is a pleasant 67 year old male kindly referred for evaluation management of dyslipidemia.  He has a history of type 2 diabetes with neuropathy, dyslipidemia, sleep apnea and ongoing tobacco use more than 50 pack years.  He also has a history of prostate cancer.  He had a recent high-resolution CT scan for screening of lung cancer that showed two-vessel coronary calcification and aortic atherosclerosis.  He has had no clinical cardiac events.  He does have a history of heart disease in his sister and his father.  He has also had longstanding high cholesterol, particular high triglycerides he reports since he was a teenager.  In the past his triglycerides have been in the 14-15 100s but he says has also been in the thousands.  He has had some benefit from fenofibrate and most recently in December 2022 his total cholesterol was 122, HDL 23, triglycerides 380 had a direct LDL of 46.  Hemoglobin A1c at that time was 7.7%.  He has been on longstanding atorvastatin more recently on the 80 mg dose but then was also on fenofibrate 160 mg daily.  He thought this combination actually caused him worsening muscle aches.  Subsequently he does continue the fenofibrate and noticed improvement in his symptoms.  He has not had lipids retested since then.  He is also somewhat concerned about his blood pressure which was mildly elevated today 138/59.  He says he monitors it infrequently at home but does have a blood pressure cuff.  He has not been on any medications and does not have a diagnosis of hypertension however is a diabetic would be recommended to be on an ACE inhibitor or ARB for kidney prevention.  Based upon the HOPE  trial data.  04/13/2022  Mr. Ito returns today for follow-up. He has seen his PCP in the interim. Repeat labwork unfortunately shows worsening hemoglobin A1c now up to 8.5% from 7.7%.  His triglycerides however have improved with total cholesterol now 122, triglycerides of 322, HDL of 21 and LDL of 51.  Unfortunately, he was found to have a hypermetabolic lung nodule confirmed with PET/CT.  He has seen thoracic surgery and pulmonary with plans for robotic assisted thoracotomy and excision.  Dr. Cliffton Asters with thoracic surgery had recommended stress testing prior to that given two-vessel coronary disease.  He seems to be asymptomatic however it is reasonable to consider this.  His blood pressure again remains elevated, systolic in the 140s.  In addition as mentioned based on HOPE trial data he would benefit from an ARB or ACE inhibitor for renal protection.  08/03/2022  Mr. Markunas is seen today in follow-up.  He is doing fairly well after recent lobectomy for lung cancer.  Unfortunately continues to smoke.  He has been working to try to cut that back.  He is now seeing oncology.  His recent lipids are reasonably well-controlled except for some high triglycerides.  Total cholesterol 127, triglycerides 256, HDL 22 and LDL 63 on 40 mg atorvastatin daily.  His A1c was 6.4%.  I had placed him on Jardiance, but he called in with some discharge from the penis.  He was suspected to have a candidal balanitis, and I treated him  with Diflucan and topical antifungal cream which resolved the issue.  He also then stopped the Jardiance and has not restarted it.  Blood pressure is a little elevated today 140/64.  He noted that recent labs showed he had some microalbuminuria.  He is on low-dose losartan.  01/18/2023  Mr. Schurman returns today for follow-up.  Overall he says he is doing well.  He says that there is so far been no recurrence of lung cancer that he is aware of.  He has an upcoming chest CT in a few days  and follow-up with Dr. Arbutus Ped.  He said blood pressure has been better controlled at home although was a little elevated today 139/68.  He does not check it regularly but has a follow-up with his PCP tomorrow.  Repeat lipids show total cholesterol 109, triglycerides 218, HDL was low at 22 and LDL 52.  He is not very physically active due to some peripheral neuropathy.  I suspect that if he could be more active that would help his HDL.  He denies any cardiac symptoms such as chest pain or shortness of breath.  PMHx:  Past Medical History:  Diagnosis Date   Cancer (HCC) 4 years ago   Prostate Cancer    Diabetes mellitus without complication (HCC)    type 2   Hyperlipidemia    Neuropathy    in legs causing balance issues per patient   Sleep apnea    Smoker     Past Surgical History:  Procedure Laterality Date   BACK SURGERY  15 years ago   BRONCHIAL BIOPSY  05/10/2022   Procedure: BRONCHIAL BIOPSIES;  Surgeon: Leslye Peer, MD;  [facility];  Service: Pulmonary;;   BRONCHIAL BRUSHINGS  05/10/2022   Procedure: BRONCHIAL BRUSHINGS;  Surgeon: Leslye Peer, MD;  Location[facility] ENDOSCOPY;  Service: Pulmonary;;   BRONCHIAL NEEDLE ASPIRATION BIOPSY  05/10/2022   Procedure: BRONCHIAL NEEDLE ASPIRATION BIOPSIES;  Surgeon: Leslye Peer, MD;  [facility];  Service: Pulmonary;;   COLONOSCOPY  2007 (at age 20)   pt does not know MD name/normal exam per pt.   FIDUCIAL MARKER PLACEMENT  05/10/2022   Procedure: FIDUCIAL DYE MARKING;  Surgeon: Leslye Peer, MD;  Location: [facility] ENDOSCOPY;  Service: Pulmonary;;   INTERCOSTAL NERVE BLOCK Right 05/10/2022   Procedure: INTERCOSTAL NERVE BLOCK;  Surgeon: Corliss Skains, MD;  [facility] OR;  Service: Thoracic;  Laterality: Right;   NODE DISSECTION Right 05/10/2022   Procedure: NODE DISSECTION;  Surgeon: Corliss Skains, MD;  [facility];  Service: Thoracic;  Laterality: Right;   PROSTATECTOMY  4 years ago    FAMHx:   Family History  Problem Relation Age of Onset   Alzheimer's disease Mother    Heart disease Father    Hypertension Sister    Heart disease Sister    Hypertension Sister    Stomach cancer Brother    Diabetes Other        family hx   Colon cancer Neg Hx     SOCHx:   reports that he has been smoking cigarettes. He has a 47.00 pack-year smoking history. He has never used smokeless tobacco. He reports that he does not drink alcohol and does not use drugs.  ALLERGIES:  Allergies  Allergen Reactions   Jardiance [Empagliflozin] Other (See Comments)    Candidal balanitis    ROS: Pertinent items noted in HPI and remainder of comprehensive ROS otherwise negative.  HOME MEDS: Current Outpatient  Medications on File Prior to Visit  Medication Sig Dispense Refill   aspirin 81 MG EC tablet Take 81 mg by mouth in the morning.     atorvastatin (LIPITOR) 40 MG tablet TAKE 1 TABLET BY MOUTH EVERY DAY 90 tablet 2   Blood Glucose Monitoring Suppl (TRUE METRIX METER) w/Device KIT USE AS DIRECTED 1 kit 0   fenofibrate (TRICOR) 145 MG tablet Take 1 tablet (145 mg total) by mouth daily. 90 tablet 3   glucose blood (ONETOUCH VERIO) test strip 1 each by Other route daily as needed for other. Use as instructed 100 each 4   ibuprofen (ADVIL) 200 MG tablet Take 400-600 mg by mouth daily as needed (pain.).     losartan (COZAAR) 50 MG tablet Take 1 tablet (50 mg total) by mouth daily. 90 tablet 3   metFORMIN (GLUCOPHAGE) 500 MG tablet TAKE 1 TABLET BY MOUTH TWICE A DAY WITH FOOD 180 tablet 1   OneTouch Delica Lancets 33G MISC 1 each by Does not apply route daily as needed. 100 each 4   No current facility-administered medications on file prior to visit.    LABS/IMAGING: No results found for this or any previous visit (from the past 48 hour(s)). No results found.  LIPID PANEL:    Component Value Date/Time   CHOL 109 01/11/2023 0859   TRIG 218 (H) 01/11/2023 0859   HDL 22 (L) 01/11/2023 0859    CHOLHDL 5.0 01/11/2023 0859   CHOLHDL 5 07/20/2022 1017   VLDL 40.8 (H) 07/20/2022 1017   LDLCALC 52 01/11/2023 0859   LDLCALC  07/23/2020 0827     Comment:     . LDL cholesterol not calculated. Triglyceride levels greater than 400 mg/dL invalidate calculated LDL results. . Reference range: <100 . Desirable range <100 mg/dL for primary prevention;   <70 mg/dL for patients with CHD or diabetic patients  with > or = 2 CHD risk factors. Marland Kitchen LDL-C is now calculated using the Martin-Hopkins  calculation, which is a validated novel method providing  better accuracy than the Friedewald equation in the  estimation of LDL-C.  Horald Pollen et al. Lenox Ahr. 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   Mr. Swanger is doing well on his current combination therapy with atorvastatin and fenofibrate.  Lipids are well-controlled.  His triglycerides are little elevated although much improved from over 1400 in the past.  His LDL is at target.  HDL is low and would benefit from more activity if possible.  His triglycerides are little lower and his A1c is improved slightly as well.  Will continue our current therapies.  Plan follow-up with  me annually or sooner as necessary.  Chrystie Nose, MD, [facility], FACP  [place]  [facility]  Medical Director of the Advanced Lipid Disorders &  Cardiovascular Risk Reduction Clinic Diplomate of the American Board of Clinical Lipidology Attending Cardiologist  Direct Dial: [phone]  Fax: [phone]  Website:  [email or web address] 01/18/2023, 10:25 AM

## 2023-01-19 ENCOUNTER — Ambulatory Visit (INDEPENDENT_AMBULATORY_CARE_PROVIDER_SITE_OTHER): Payer: PPO | Admitting: Family Medicine

## 2023-01-19 ENCOUNTER — Encounter: Payer: Self-pay | Admitting: Family Medicine

## 2023-01-19 VITALS — BP 145/72 | HR 64 | Temp 96.9°F | Ht 68.0 in | Wt 179.4 lb

## 2023-01-19 DIAGNOSIS — E119 Type 2 diabetes mellitus without complications: Secondary | ICD-10-CM

## 2023-01-19 DIAGNOSIS — Z7984 Long term (current) use of oral hypoglycemic drugs: Secondary | ICD-10-CM

## 2023-01-19 DIAGNOSIS — G6289 Other specified polyneuropathies: Secondary | ICD-10-CM

## 2023-01-19 DIAGNOSIS — I152 Hypertension secondary to endocrine disorders: Secondary | ICD-10-CM | POA: Diagnosis not present

## 2023-01-19 DIAGNOSIS — E1159 Type 2 diabetes mellitus with other circulatory complications: Secondary | ICD-10-CM | POA: Diagnosis not present

## 2023-01-19 DIAGNOSIS — M79602 Pain in left arm: Secondary | ICD-10-CM

## 2023-01-19 DIAGNOSIS — Z1211 Encounter for screening for malignant neoplasm of colon: Secondary | ICD-10-CM

## 2023-01-19 MED ORDER — MELOXICAM 15 MG PO TABS: 15.0000 mg | ORAL_TABLET | Freq: Every day | ORAL | 0 refills | Status: DC

## 2023-01-19 NOTE — Assessment & Plan Note (Signed)
Last A1c 6.2.  On metformin 500 mg twice daily.  He will continue current dose.  Also continue lifestyle modifications.  Recheck again in 3 to 6 months.

## 2023-01-19 NOTE — Assessment & Plan Note (Signed)
Still having some ongoing issues with weakness and instability.  He has had complete neurologic workup a couple of years ago via neurology including imaging, labs, and lumbar puncture without obvious etiology.  We did discuss referral for second opinion or possibly referral to tertiary care center however he deferred for today.  He will let us know if he changes his mind.

## 2023-01-19 NOTE — Progress Notes (Signed)
   Gregory Jensen is a 67 y.o. male who presents today for an office visit.  Assessment/Plan:  New/Acute Problems: Left Arm Pain No red flags.  Likely has some underlying osteoarthritis though may have some rotator cuff tendinopathy as well based on exam.  We discussed treatment options.  We will start meloxicam 15 mg daily for the next 1 to 2 weeks.  We discussed home exercise program and handout was given.  He will let me know if not improving in the next 1 to 2 weeks and would consider imaging and referral to PT or sports medicine at that time.   Chronic Problems Addressed Today: Diabetes mellitus without complication (HCC) Last A1c 6.2.  On metformin 500 mg twice daily.  He will continue current dose.  Also continue lifestyle modifications.  Recheck again in 3 to 6 months.  Hypertension associated with diabetes (HCC) Mildly today but was at goal at cardiology yesterday.  Continue losartan 50 mg daily.  He will monitor at home and let us know if persistently elevated.  Peripheral neuropathy Still having some ongoing issues with weakness and instability.  He has had complete neurologic workup a couple of years ago via neurology including imaging, labs, and lumbar puncture without obvious etiology.  We did discuss referral for second opinion or possibly referral to tertiary care center however he deferred for today.  He will let us know if he changes his mind.     Subjective:  HPI:  See Assessment / plan for status of chronic conditions.  He has been having some left arm pain. Also some pain in his left shoulder.  This started several year ago. Comes and goes. Worse with certain motions. Now has been present for the last month. No obvious injuries or precipitating events. Tried ibuprofen which does help some.        Objective:  Physical Exam: BP (!) 145/72   Pulse 64   Temp (!) 96.9 F (36.1 C) (Temporal)   Ht 5\' 8"  (1.727 m)   Wt 179 lb 6.4 oz (81.4 kg)   SpO2 99%   BMI  27.28 kg/m   Gen: No acute distress, resting comfortably CV: Regular rate and rhythm with no murmurs appreciated Pulm: Normal work of breathing, clear to auscultation bilaterally with no crackles, wheezes, or rhonchi MUSCULOSKELETAL: - Left Arm: No deformities.  Full strength throughout.  Neurovascular intact distally.  Mild tenderness elicited with resisted supraspinatus testing.  Mild weakness and tenderness with internal and external rotation.  Biceps tendon palpated without tenderness.  Spurling test negative.  Tinel sign negative at wrist and medial epicondyle. Neuro: Grossly normal, moves all extremities Psych: Normal affect and thought content      Ahman Dugdale M. [name], MD 01/19/2023 9:22 AM

## 2023-01-19 NOTE — Patient Instructions (Signed)
It was very nice to see you today!  Your Hemoglobin A1c looks great.  Please continue your current dose of metformin.  I think you probably have some arthritis in your shoulder.  He probably also has some tendinitis as well.  Start meloxicam.  Work on the exercises.  Let us know if not improving.  Return in about 6 months (around 07/21/2023) for Follow Up.   Take care, Dr [name]  PLEASE NOTE:  If you had any lab tests, please let us know if you have not heard back within a few days. You may see your results on mychart before we have a chance to review them but we will give you a call once they are reviewed by [place].   If we ordered any referrals today, please let us know if you have not heard from their office within the next week.   If you had any urgent prescriptions sent in today, please check with the pharmacy within an hour of our visit to make sure the prescription was transmitted appropriately.   Please try these tips to maintain a healthy lifestyle:  Eat at least 3 REAL meals and 1-2 snacks per day.  Aim for no more than 5 hours between eating.  If you eat breakfast, please do so within one hour of getting up.   Each meal should contain half fruits/vegetables, one quarter protein, and one quarter carbs (no bigger than a computer mouse)  Cut down on sweet beverages. This includes juice, soda, and sweet tea.   Drink at least 1 glass of water with each meal and aim for at least 8 glasses per day  Exercise at least 150 minutes every week.

## 2023-01-19 NOTE — Assessment & Plan Note (Signed)
Mildly today but was at goal at cardiology yesterday.  Continue losartan 50 mg daily.  He will monitor at home and let us know if persistently elevated.

## 2023-01-21 ENCOUNTER — Inpatient Hospital Stay: Payer: PPO | Attending: Internal Medicine

## 2023-01-21 ENCOUNTER — Ambulatory Visit (HOSPITAL_COMMUNITY)
Admission: RE | Admit: 2023-01-21 | Discharge: 2023-01-21 | Disposition: A | Discharge status: Home / Self Care | Payer: PPO | Source: Ambulatory Visit | Attending: Internal Medicine | Admitting: Internal Medicine

## 2023-01-21 ENCOUNTER — Other Ambulatory Visit: Payer: Self-pay

## 2023-01-21 DIAGNOSIS — Z8546 Personal history of malignant neoplasm of prostate: Secondary | ICD-10-CM | POA: Insufficient documentation

## 2023-01-21 DIAGNOSIS — C3490 Malignant neoplasm of unspecified part of unspecified bronchus or lung: Secondary | ICD-10-CM | POA: Diagnosis not present

## 2023-01-21 DIAGNOSIS — J439 Emphysema, unspecified: Secondary | ICD-10-CM | POA: Diagnosis not present

## 2023-01-21 DIAGNOSIS — Z902 Acquired absence of lung [part of]: Secondary | ICD-10-CM | POA: Insufficient documentation

## 2023-01-21 DIAGNOSIS — C3411 Malignant neoplasm of upper lobe, right bronchus or lung: Secondary | ICD-10-CM | POA: Insufficient documentation

## 2023-01-21 LAB — CBC WITH DIFFERENTIAL (CANCER CENTER ONLY)
Abs Immature Granulocytes: 0.03 10*3/uL (ref 0.00–0.07)
Basophils Absolute: 0.1 10*3/uL (ref 0.0–0.1)
Basophils Relative: 1 %
Eosinophils Absolute: 0.1 10*3/uL (ref 0.0–0.5)
Eosinophils Relative: 2 %
HCT: 40.6 % (ref 39.0–52.0)
Hemoglobin: 13.9 g/dL (ref 13.0–17.0)
Immature Granulocytes: 0 %
Lymphocytes Relative: 26 %
Lymphs Abs: 2 10*3/uL (ref 0.7–4.0)
MCH: 31.4 pg (ref 26.0–34.0)
MCHC: 34.2 g/dL (ref 30.0–36.0)
MCV: 91.6 fL (ref 80.0–100.0)
Monocytes Absolute: 0.5 10*3/uL (ref 0.1–1.0)
Monocytes Relative: 6 %
Neutro Abs: 5.1 10*3/uL (ref 1.7–7.7)
Neutrophils Relative %: 65 %
Platelet Count: 197 10*3/uL (ref 150–400)
RBC: 4.43 MIL/uL (ref 4.22–5.81)
RDW: 12.7 % (ref 11.5–15.5)
WBC Count: 7.8 10*3/uL (ref 4.0–10.5)
nRBC: 0 % (ref 0.0–0.2)

## 2023-01-21 LAB — CMP (CANCER CENTER ONLY)
ALT: 16 U/L (ref 0–44)
AST: 18 U/L (ref 15–41)
Albumin: 4.7 g/dL (ref 3.5–5.0)
Alkaline Phosphatase: 49 U/L (ref 38–126)
Anion gap: 5 (ref 5–15)
BUN: 24 mg/dL — ABNORMAL HIGH (ref 8–23)
CO2: 26 mmol/L (ref 22–32)
Calcium: 9.9 mg/dL (ref 8.9–10.3)
Chloride: 108 mmol/L (ref 98–111)
Creatinine: 1.09 mg/dL (ref 0.61–1.24)
GFR, Estimated: >60 mL/min (ref >60)
Glucose, Bld: 119 mg/dL — ABNORMAL HIGH (ref 70–99)
Potassium: 4 mmol/L (ref 3.5–5.1)
Sodium: 139 mmol/L (ref 135–145)
Total Bilirubin: 0.7 mg/dL (ref 0.3–1.2)
Total Protein: 7.7 g/dL (ref 6.5–8.1)

## 2023-01-21 MED ORDER — IOHEXOL 300 MG/ML  SOLN
75.0000 mL | Freq: Once | INTRAMUSCULAR | Status: AC | PRN
  Administered 2023-01-21: 75 mL via INTRAVENOUS

## 2023-01-21 MED ORDER — SODIUM CHLORIDE (PF) 0.9 % IJ SOLN
INTRAMUSCULAR | Status: AC
  Filled 2023-01-21: qty 50

## 2023-01-25 ENCOUNTER — Inpatient Hospital Stay (HOSPITAL_BASED_OUTPATIENT_CLINIC_OR_DEPARTMENT_OTHER): Payer: PPO | Admitting: Internal Medicine

## 2023-01-25 ENCOUNTER — Other Ambulatory Visit: Payer: Self-pay

## 2023-01-25 VITALS — BP 137/69 | HR 77 | Temp 97.9°F | Resp 17 | Wt 179.5 lb

## 2023-01-25 DIAGNOSIS — C3490 Malignant neoplasm of unspecified part of unspecified bronchus or lung: Secondary | ICD-10-CM

## 2023-01-25 DIAGNOSIS — Z8546 Personal history of malignant neoplasm of prostate: Secondary | ICD-10-CM | POA: Diagnosis not present

## 2023-01-25 DIAGNOSIS — C3411 Malignant neoplasm of upper lobe, right bronchus or lung: Secondary | ICD-10-CM | POA: Diagnosis not present

## 2023-01-25 DIAGNOSIS — Z902 Acquired absence of lung [part of]: Secondary | ICD-10-CM | POA: Diagnosis not present

## 2023-01-25 NOTE — Progress Notes (Signed)
[facility] [phone]   [phone]  OFFICE PROGRESS NOTE  Ardith Dark, MD [address] [place] [address]  DIAGNOSIS:  Stage IA (T1c, N0, M0) non-small cell lung cancer, squamous cell carcinoma presented with right upper lobe in July 2023   PRIOR THERAPY: Status post right upper lobectomy with lymph node sampling under the care of Dr. Cliffton Asters on 05/10/2022 with tumor size of 2.3 cm.   CURRENT THERAPY: Observation.  INTERVAL HISTORY: Gregory Jensen 67 y.o. male returns to the clinic today for follow-up visit.  The patient is feeling fine today with no concerning complaints except for mild shortness of breath with exertion.  He has no chest pain, cough or hemoptysis.  He has no nausea, vomiting, diarrhea or constipation.  He has no headache or visual changes.  He has no recent weight loss or night sweats.  He is here today for evaluation with repeat CT scan of the chest for restaging of his disease.  MEDICAL HISTORY: Past Medical History:  Diagnosis Date   Cancer (HCC) 4 years ago   Prostate Cancer    Diabetes mellitus without complication (HCC)    type 2   Hyperlipidemia    Neuropathy    in legs causing balance issues per patient   Sleep apnea    Smoker     ALLERGIES:  is allergic to jardiance [empagliflozin].  MEDICATIONS:  Current Outpatient Medications  Medication Sig Dispense Refill   aspirin 81 MG EC tablet Take 81 mg by mouth in the morning.     atorvastatin (LIPITOR) 40 MG tablet TAKE 1 TABLET BY MOUTH EVERY DAY 90 tablet 2   Blood Glucose Monitoring Suppl (TRUE METRIX METER) w/Device KIT USE AS DIRECTED 1 kit 0   fenofibrate (TRICOR) 145 MG tablet Take 1 tablet (145 mg total) by mouth daily. 90 tablet 3   glucose blood (ONETOUCH VERIO) test strip 1 each by Other route daily as needed for other. Use as instructed 100 each 4   ibuprofen (ADVIL) 200 MG tablet Take 400-600 mg by mouth daily as needed (pain.).      losartan (COZAAR) 50 MG tablet Take 1 tablet (50 mg total) by mouth daily. 90 tablet 3   meloxicam (MOBIC) 15 MG tablet Take 1 tablet (15 mg total) by mouth daily. 30 tablet 0   metFORMIN (GLUCOPHAGE) 500 MG tablet TAKE 1 TABLET BY MOUTH TWICE A DAY WITH FOOD 180 tablet 1   OneTouch Delica Lancets 33G MISC 1 each by Does not apply route daily as needed. 100 each 4   No current facility-administered medications for this visit.    SURGICAL HISTORY:  Past Surgical History:  Procedure Laterality Date   BACK SURGERY  15 years ago   BRONCHIAL BIOPSY  05/10/2022   Procedure: BRONCHIAL BIOPSIES;  Surgeon: Leslye Peer, MD;  Location: [facility] ENDOSCOPY;  Service: Pulmonary;;   BRONCHIAL BRUSHINGS  05/10/2022   Procedure: BRONCHIAL BRUSHINGS;  Surgeon: Leslye Peer, MD;  Location: [facility] ENDOSCOPY;  Service: Pulmonary;;   BRONCHIAL NEEDLE ASPIRATION BIOPSY  05/10/2022   Procedure: BRONCHIAL NEEDLE ASPIRATION BIOPSIES;  Surgeon: Leslye Peer, MD;  Location: [facility] ENDOSCOPY;  Service: Pulmonary;;   COLONOSCOPY  2007 (at age 34)   pt does not know MD name/normal exam per pt.   FIDUCIAL MARKER PLACEMENT  05/10/2022   Procedure: FIDUCIAL DYE MARKING;  Surgeon: Leslye Peer, MD;  Location: [facility] ENDOSCOPY;  Service: Pulmonary;;   Leroy Libman  NERVE BLOCK Right 05/10/2022   Procedure: INTERCOSTAL NERVE BLOCK;  Surgeon: Corliss Skains, MD;  Location: [facility] OR;  Service: Thoracic;  Laterality: Right;   NODE DISSECTION Right 05/10/2022   Procedure: NODE DISSECTION;  Surgeon: Corliss Skains, MD;  Location: [facility] OR;  Service: Thoracic;  Laterality: Right;   PROSTATECTOMY  4 years ago    REVIEW OF SYSTEMS:  A comprehensive review of systems was negative except for: Respiratory: positive for dyspnea on exertion   PHYSICAL EXAMINATION: General appearance: alert, cooperative, and no distress Head: Normocephalic, without obvious abnormality, atraumatic Neck: no adenopathy, no JVD, supple, symmetrical, trachea  midline, and thyroid not enlarged, symmetric, no tenderness/mass/nodules Lymph nodes: Cervical, supraclavicular, and axillary nodes normal. Resp: clear to auscultation bilaterally Back: symmetric, no curvature. ROM normal. No CVA tenderness. Cardio: regular rate and rhythm, S1, S2 normal, no murmur, click, rub or gallop GI: soft, non-tender; bowel sounds normal; no masses,  no organomegaly Extremities: extremities normal, atraumatic, no cyanosis or edema  ECOG PERFORMANCE STATUS: 1 - Symptomatic but completely ambulatory  Blood pressure 137/69, pulse 77, temperature 97.9 F (36.6 C), temperature source Oral, resp. rate 17, weight 179 lb 8 oz (81.4 kg), SpO2 94 %.  LABORATORY DATA: Lab Results  Component Value Date   WBC 7.8 01/21/2023   HGB 13.9 01/21/2023   HCT 40.6 01/21/2023   MCV 91.6 01/21/2023   PLT 197 01/21/2023      Chemistry      Component Value Date/Time   NA 139 01/21/2023 1055   NA 138 04/27/2022 0847   K 4.0 01/21/2023 1055   CL 108 01/21/2023 1055   CO2 26 01/21/2023 1055   BUN 24 (H) 01/21/2023 1055   BUN 19 04/27/2022 0847   CREATININE 1.09 01/21/2023 1055   CREATININE 0.97 07/23/2020 0827      Component Value Date/Time   CALCIUM 9.9 01/21/2023 1055   ALKPHOS 49 01/21/2023 1055   AST 18 01/21/2023 1055   ALT 16 01/21/2023 1055   BILITOT 0.7 01/21/2023 1055       RADIOGRAPHIC STUDIES: CT Chest W Contrast  Result Date: 01/25/2023 CLINICAL DATA:  Status post right upper lobectomy 05/10/2022 for stage I A non-small cell lung cancer. Restaging. * Tracking Code: BO * EXAM: CT CHEST WITH CONTRAST TECHNIQUE: Multidetector CT imaging of the chest was performed during intravenous contrast administration. RADIATION DOSE REDUCTION: This exam was performed according to the departmental dose-optimization program which includes automated exposure control, adjustment of the mA and/or kV according to patient size and/or use of iterative reconstruction technique.  CONTRAST:  75mL OMNIPAQUE IOHEXOL 300 MG/ML  SOLN COMPARISON:  06/25/2022 chest radiograph. 05/04/2022 chest CT. 03/10/2022 PET-CT. FINDINGS: Cardiovascular: Normal heart size. No significant pericardial effusion/thickening. Three-vessel coronary atherosclerosis. Atherosclerotic nonaneurysmal thoracic aorta. Normal caliber pulmonary arteries. No central pulmonary emboli. Mediastinum/Nodes: Hypodense 1.0 cm right thyroid nodule, not clearly seen on prior chest CT. Not clinically significant; no follow-up imaging recommended (ref: J Am Coll Radiol. 2015 Feb;12(2): 143-50). Unremarkable esophagus. No axillary adenopathy. Several shotty right paratracheal nodes, largest 1.0 cm short axis diameter in the high right paratracheal region (series 2/image 25), newly enlarged. No pathologically enlarged hilar nodes. Lungs/Pleura: No pneumothorax. No pleural effusion. Status post right upper lobectomy. Left lower lobe solid 0.5 cm pulmonary nodule (series 6/image 114), stable from 02/06/2020 chest CT. Mild centrilobular emphysema. No acute consolidative airspace disease or new significant pulmonary nodules. Upper abdomen: Dystrophic posterior right liver dome calcification is unchanged. Musculoskeletal: No aggressive appearing focal  osseous lesions. Mild thoracic spondylosis. IMPRESSION: 1. New mild shoddy right paratracheal lymphadenopathy, equivocal for recurrent metastatic disease versus reactive adenopathy. Suggest attention on short-term follow-up chest CT with IV contrast in 3 months. 2. No additional potential findings of metastatic disease in the chest. 3. Three-vessel coronary atherosclerosis. 4. Aortic Atherosclerosis (ICD10-I70.0) and Emphysema (ICD10-J43.9). Electronically Signed   By: Delbert Phenix M.D.   On: 01/25/2023 08:47    ASSESSMENT AND PLAN: This is a very pleasant 67 years old white male with stage IA (T1c, N0, M0) non-small cell lung cancer, squamous cell carcinoma presented with right upper lobe in July  2023 status post right upper lobectomy with lymph node sampling under the care of Dr. Cliffton Asters on 05/10/2022 with tumor size of 2.3 cm.  The patient is currently on observation and he is feeling fine with no concerning complaints. He had repeat CT scan of the chest performed recently.  I personally and independently reviewed the scan images and discussed the result and showed the images to the patient today. His scan showed no concerning findings for disease recurrence except for new mild shotty right paratracheal lymphadenopathy suspicious for recurrent/metastatic disease but reactive adenopathy is still a consideration. I recommended for the patient to have repeat CT scan of the chest in 3 months for further evaluation of this lesion but he was also advised to call immediately if he has any concerning symptoms in the interval especially any concerning shortness of breath, cough or hemoptysis. The patient voices understanding of current disease status and treatment options and is in agreement with the current care plan.  All questions were answered. The patient knows to call the clinic with any problems, questions or concerns. We can certainly see the patient much sooner if necessary.  The total time spent in the appointment was 20 minutes.  Disclaimer: This note was dictated with voice recognition software. Similar sounding words can inadvertently be transcribed and may not be corrected upon review.       Lab and scan before next MD visit with Dr. Arbutus Ped in 3 months.

## 2023-02-15 ENCOUNTER — Other Ambulatory Visit: Payer: Self-pay | Admitting: Family Medicine

## 2023-02-21 ENCOUNTER — Telehealth: Payer: Self-pay | Admitting: Family Medicine

## 2023-02-21 NOTE — Telephone Encounter (Signed)
Patient states he has taken the meloxicam given to him at 6/5 OV for shoulder pain but it didn't help. States he would like to be referred to shoulder specialist as PCP recommended previously. Please Advise.

## 2023-02-22 NOTE — Telephone Encounter (Signed)
Please advise 

## 2023-02-22 NOTE — Telephone Encounter (Signed)
Ok with referral to sports medicine. Gregory Jensen. [name], MD 02/22/2023 3:19 PM

## 2023-02-24 ENCOUNTER — Other Ambulatory Visit: Payer: Self-pay

## 2023-02-24 DIAGNOSIS — G8929 Other chronic pain: Secondary | ICD-10-CM

## 2023-02-24 NOTE — Telephone Encounter (Signed)
Referral has been placed, spoke with pt and pt is aware.

## 2023-03-11 ENCOUNTER — Ambulatory Visit: Payer: PPO | Admitting: Family Medicine

## 2023-03-11 ENCOUNTER — Encounter: Payer: Self-pay | Admitting: Family Medicine

## 2023-03-11 VITALS — BP 134/70 | Ht 68.0 in | Wt 180.0 lb

## 2023-03-11 DIAGNOSIS — M542 Cervicalgia: Secondary | ICD-10-CM

## 2023-03-11 DIAGNOSIS — M25512 Pain in left shoulder: Secondary | ICD-10-CM

## 2023-03-11 MED ORDER — METHYLPREDNISOLONE ACETATE 40 MG/ML IJ SUSP
40.0000 mg | Freq: Once | INTRAMUSCULAR | Status: AC
  Administered 2023-03-11: 40 mg via INTRA_ARTICULAR

## 2023-03-11 NOTE — Progress Notes (Signed)
  CLEBURNE MUSTO - 67 y.o. male MRN [id number]  Date of birth: 04-24-1956    SUBJECTIVE:      Chief Complaint:/ HPI:  #1.  Left shoulder pain: Has been there quite a while.  4 to 5 months at least.  Prior to this he had occasional issues with the shoulder and upper arm but this time it has started bothering him and has not resolved and he is having it daily.  Pain is really in the upper arm in the bicep and tricep area and occasionally he has pain in his forearm.  He also has pain with reaching forward or reaching overhead.  PERTINENT  PMH / PSH: I have reviewed the patient's medications, allergies, past medical and surgical history, smoking status.  Pertinent findings that relate to today's visit / issues include: Right lobectomy for lung cancer; recent follow-up CT shows potential current metastatic disease in the right paratracheal lymph node currently being followed by oncology   OBJECTIVE: BP 134/70   Ht 5\' 8"  (1.727 m)   Wt 180 lb (81.6 kg)   BMI 27.37 kg/m   Physical Exam:  Vital signs are reviewed. GENERAL: Well-developed male no acute distress Neck: Limited flexion and extension secondary to stiffness.  He also has lateral rotation to each side by about 20 degrees only.  Negative Spurling's. SHOULDERS: Left shoulder pain with liftoff test and with internal rotation, can only get hand to posterior hip.  Supraspinatus testing is painful but he has intact strength.  No tenderness to palpation over the biceps tendon or the shoulder musculature.  Forearm musculature is normal in muscle bulk and tone. NEURO: Intact soft touch sensation left upper extremity.  PROCEDURE: INJECTION: Patient was given informed consent, signed copy in the chart. Appropriate time out was taken. Area prepped and draped in usual sterile fashion. Ethyl chloride was  used for local anesthesia. A 21 gauge 1 1/2 inch needle was used..  1 cc of methylprednisolone 40 mg/ml plus 4 cc of 1% lidocaine without  epinephrine was injected into the left subacromial bursa of the left shoulder using a(n) posterior approach.   The patient tolerated the procedure well. There were no complications. Post procedure instructions were given.   ASSESSMENT & PLAN:  See problem based charting & AVS for pt instructions. No problem-specific Assessment & Plan notes found for this encounter. Left shoulder and arm pain: This is actually more of a left upper extremity pain syndrome and it is not clear to me that this is coming from the shoulder.  We discussed at length.  He has not had any imaging of his neck or shoulder.  Will order those.   - We discussed consider corticosteroid injection either in the subacromial bursa or in the glenohumeral joint., partly as a diagnostic tool but potentially as treatment also.  He opted to start with a subacromial bursa today.   -If his symptoms totally resolved, he does not need to follow-up.   - If they do not resolve or significantly improve in the next 3 weeks or so I would have him return to clinic we could consider further evaluation.   -This may be coming from his neck as his symptoms are also consistent with referred pain. He also has very significant neck stiffness so he may be having some arthritic impingement in the cervical spine  as well.

## 2023-03-11 NOTE — Patient Instructions (Signed)
You have slightly unusual left shoulder and arm pain.  As we discussed, there are 2 or 3 potential causes of this.    Today, I gave you a subacromial injection in the left shoulder with steroid.  I would like to see you back in 2 to 4 weeks and we will see how that works for you.    If it gets worse in the interim, please call us.  If it is totally resolved after this injection, you can cancel your appointment.  If it is not resolved when I see you back, we will discuss the other options which include a different kind of shoulder injection that is done under ultrasound.  I am also ordering some x-rays of both your neck and your shoulder.  You can get those done at your convenience at [facility] imaging.  Once I receive the readings on those, I will send you a note in the mail.  If there is anything unexpected on them, I will call you.  Great to meet you!

## 2023-03-13 ENCOUNTER — Other Ambulatory Visit (HOSPITAL_BASED_OUTPATIENT_CLINIC_OR_DEPARTMENT_OTHER): Payer: Self-pay | Admitting: Internal Medicine

## 2023-03-15 ENCOUNTER — Ambulatory Visit: Admission: RE | Admit: 2023-03-15 | Payer: PPO | Source: Ambulatory Visit

## 2023-03-15 DIAGNOSIS — M542 Cervicalgia: Secondary | ICD-10-CM

## 2023-03-15 DIAGNOSIS — M25512 Pain in left shoulder: Secondary | ICD-10-CM

## 2023-03-24 ENCOUNTER — Encounter: Payer: Self-pay | Admitting: Family Medicine

## 2023-03-28 ENCOUNTER — Ambulatory Visit (AMBULATORY_SURGERY_CENTER): Payer: PPO

## 2023-03-28 VITALS — Ht 68.0 in | Wt 180.0 lb

## 2023-03-28 DIAGNOSIS — Z86010 Personal history of colonic polyps: Secondary | ICD-10-CM

## 2023-03-28 MED ORDER — NA SULFATE-K SULFATE-MG SULF 17.5-3.13-1.6 GM/177ML PO SOLN
1.0000 | Freq: Once | ORAL | 0 refills | Status: AC
Start: 2023-03-28 — End: 2023-03-28

## 2023-03-28 NOTE — Progress Notes (Signed)
No egg or soy allergy known to patient  No issues known to pt with past sedation with any surgeries or procedures Patient denies ever being told they had issues or difficulty with intubation  No FH of Malignant Hyperthermia Pt is not on diet pills Pt is not on  home 02  Pt is not on blood thinners  Pt denies issues with constipation  No A fib or A flutter Have any cardiac testing pending--no Pt instructed to use Singlecare.com or GoodRx for a price reduction on prep  Patient's chart reviewed by Gregory Jensen CNRA prior to previsit and patient appropriate for the [facility].  Previsit completed and red dot placed by patient's name on their procedure day (on provider's schedule).    

## 2023-04-01 ENCOUNTER — Encounter: Payer: Self-pay | Admitting: Family Medicine

## 2023-04-01 ENCOUNTER — Ambulatory Visit: Payer: PPO | Admitting: Family Medicine

## 2023-04-01 VITALS — BP 138/60 | Ht 68.0 in | Wt 180.0 lb

## 2023-04-01 DIAGNOSIS — M79602 Pain in left arm: Secondary | ICD-10-CM

## 2023-04-01 NOTE — Progress Notes (Unsigned)
  ENSAR AUNGST - 67 y.o. male MRN [id number]  Date of birth: [birth year]   [facility] Attending Note: I have seen and examined this patient. I have discussed this patient with the resident and reviewed the assessment and plan as documented above. I agree with the resident's findings and plan.  DIffuse pain no better at all after CSI into subacromial bursa.  - X rays of neck show some mild to moderate DJD. He has some foraminal encroachment at a couple of levels but they are both on the right side, and his sx are on left.  - We discussed options. Further work up could include MRI neck looking for impingement (X rays do not rule it out), perhaps pursuing GHJ shoulder injection under ultrasound, or medication such as gabapentin.  - I also offered to refer to shoulder/upper extremity specialist.   For now, he opted to do none of these routes but just follow. Will let us know if he changes his mind or if symptoms worsen.  - Complex problem, especially in setting of known cancer, although recent scans do not show any activity in shoulder/extremity or neck. Moderate MDM based on review of scans, tests and chart.

## 2023-04-01 NOTE — Progress Notes (Signed)
  Gregory Jensen - 67 y.o. male MRN [id number]  Date of birth: August 10, 1956    SUBJECTIVE:      Chief Complaint:/ HPI:  Left shoulder pain: Pain present for several months.  Last seen on 03/07/2023 by Dr. Jennette Kettle.  At that time it was unclear the exact cause of shoulder pain, and subacromial corticosteroid injection was given.  Imaging of neck and shoulder were obtained, not diagnostic of pain.  He has had no resolution of pain with injection.  Mobic did not provide relief.  He does not have radiculopathy symptoms.  OBJECTIVE: BP 138/60   Ht 5\' 8"  (1.727 m)   Wt 180 lb (81.6 kg)   BMI 27.37 kg/m   Physical Exam:  Vital signs are reviewed. Neck: Limited flexion extension, similar to prior exam.  Reduced rotation to right.  No TTP. Negative Spurling's. Shoulders: No gross deformity, skin intact, no ecchymosis.  No TTP. FROM. 5/5 str and normal sensation.  Negative empty-can Negative Hawking's Negative Neer's Negative Jurgenson's Positive O'Brien's  ASSESSMENT & PLAN:  See problem based charting & AVS for pt instructions.  Left shoulder pain: Unclear etiology.  Mild cervical osteoarthritis, unable to exclude cervical radiculopathy.  Normal strength/ROM lower suspicion for rotator cuff tendinopathy.  Adequate joint space of left shoulder on x-ray, exam and history inconsistent with intra-articular pathology.  However, he may have some degree of labral involvement with positive O'Brien's.  Discussed next steps with patient which included: Starting gabapentin for pain, MRI to evaluate cervical radiculopathy, referral to orthopedic surgeon.  Patient like to consider his options and will call back with decisions.  Follow-up as needed.

## 2023-04-03 NOTE — Assessment & Plan Note (Addendum)
DIffuse pain no better at all after CSI into subacromial bursa.  - X rays of neck show some mild to moderate DJD. He has some foraminal encroachment at a couple of levels but they are both on the right side, and his sx are on left.  - We discussed options. Further work up could include MRI neck looking for impingement (X rays do not rule it out), perhaps pursuing GHJ shoulder injection under ultrasound, or medication such as gabapentin.  - I also offered to refer to shoulder/upper extremity specialist.   For now, he opted to do none of these routes but just follow. Will let us know if he changes his mind or if symptoms worsen.

## 2023-04-08 ENCOUNTER — Encounter: Payer: Self-pay | Admitting: Gastroenterology

## 2023-04-21 ENCOUNTER — Ambulatory Visit (HOSPITAL_COMMUNITY)
Admission: RE | Admit: 2023-04-21 | Discharge: 2023-04-21 | Disposition: A | Discharge status: Home / Self Care | Payer: PPO | Source: Ambulatory Visit | Attending: Internal Medicine | Admitting: Internal Medicine

## 2023-04-21 ENCOUNTER — Inpatient Hospital Stay: Payer: PPO | Attending: Internal Medicine

## 2023-04-21 DIAGNOSIS — M25519 Pain in unspecified shoulder: Secondary | ICD-10-CM | POA: Insufficient documentation

## 2023-04-21 DIAGNOSIS — C3490 Malignant neoplasm of unspecified part of unspecified bronchus or lung: Secondary | ICD-10-CM | POA: Insufficient documentation

## 2023-04-21 DIAGNOSIS — Z8546 Personal history of malignant neoplasm of prostate: Secondary | ICD-10-CM | POA: Insufficient documentation

## 2023-04-21 DIAGNOSIS — I700 Atherosclerosis of aorta: Secondary | ICD-10-CM | POA: Diagnosis not present

## 2023-04-21 DIAGNOSIS — Z902 Acquired absence of lung [part of]: Secondary | ICD-10-CM | POA: Insufficient documentation

## 2023-04-21 DIAGNOSIS — C3411 Malignant neoplasm of upper lobe, right bronchus or lung: Secondary | ICD-10-CM | POA: Insufficient documentation

## 2023-04-21 DIAGNOSIS — Z87891 Personal history of nicotine dependence: Secondary | ICD-10-CM | POA: Insufficient documentation

## 2023-04-21 DIAGNOSIS — J432 Centrilobular emphysema: Secondary | ICD-10-CM | POA: Diagnosis not present

## 2023-04-21 LAB — CBC WITH DIFFERENTIAL (CANCER CENTER ONLY)
Abs Immature Granulocytes: 0.04 10*3/uL (ref 0.00–0.07)
Basophils Absolute: 0.1 10*3/uL (ref 0.0–0.1)
Basophils Relative: 1 %
Eosinophils Absolute: 0.1 10*3/uL (ref 0.0–0.5)
Eosinophils Relative: 2 %
HCT: 40.9 % (ref 39.0–52.0)
Hemoglobin: 14.3 g/dL (ref 13.0–17.0)
Immature Granulocytes: 1 %
Lymphocytes Relative: 23 %
Lymphs Abs: 1.8 10*3/uL (ref 0.7–4.0)
MCH: 32.7 pg (ref 26.0–34.0)
MCHC: 35 g/dL (ref 30.0–36.0)
MCV: 93.6 fL (ref 80.0–100.0)
Monocytes Absolute: 0.4 10*3/uL (ref 0.1–1.0)
Monocytes Relative: 5 %
Neutro Abs: 5.4 10*3/uL (ref 1.7–7.7)
Neutrophils Relative %: 68 %
Platelet Count: 190 10*3/uL (ref 150–400)
RBC: 4.37 MIL/uL (ref 4.22–5.81)
RDW: 12.6 % (ref 11.5–15.5)
WBC Count: 7.8 10*3/uL (ref 4.0–10.5)
nRBC: 0 % (ref 0.0–0.2)

## 2023-04-21 LAB — CMP (CANCER CENTER ONLY)
ALT: 19 U/L (ref 0–44)
AST: 18 U/L (ref 15–41)
Albumin: 4.7 g/dL (ref 3.5–5.0)
Alkaline Phosphatase: 55 U/L (ref 38–126)
Anion gap: 8 (ref 5–15)
BUN: 17 mg/dL (ref 8–23)
CO2: 28 mmol/L (ref 22–32)
Calcium: 10 mg/dL (ref 8.9–10.3)
Chloride: 106 mmol/L (ref 98–111)
Creatinine: 1.1 mg/dL (ref 0.61–1.24)
GFR, Estimated: >60 mL/min (ref >60)
Glucose, Bld: 218 mg/dL — ABNORMAL HIGH (ref 70–99)
Potassium: 4.7 mmol/L (ref 3.5–5.1)
Sodium: 142 mmol/L (ref 135–145)
Total Bilirubin: 0.7 mg/dL (ref 0.3–1.2)
Total Protein: 7.5 g/dL (ref 6.5–8.1)

## 2023-04-21 MED ORDER — IOHEXOL 300 MG/ML  SOLN
75.0000 mL | Freq: Once | INTRAMUSCULAR | Status: AC | PRN
  Administered 2023-04-21: 75 mL via INTRAVENOUS

## 2023-04-25 ENCOUNTER — Encounter: Payer: PPO | Admitting: Gastroenterology

## 2023-04-25 ENCOUNTER — Inpatient Hospital Stay: Payer: PPO | Admitting: Internal Medicine

## 2023-04-25 VITALS — BP 130/68 | HR 73 | Temp 98.1°F | Resp 17 | Ht 68.0 in | Wt 177.0 lb

## 2023-04-25 DIAGNOSIS — C3490 Malignant neoplasm of unspecified part of unspecified bronchus or lung: Secondary | ICD-10-CM | POA: Diagnosis not present

## 2023-04-25 DIAGNOSIS — Z8546 Personal history of malignant neoplasm of prostate: Secondary | ICD-10-CM | POA: Diagnosis not present

## 2023-04-25 DIAGNOSIS — C3411 Malignant neoplasm of upper lobe, right bronchus or lung: Secondary | ICD-10-CM | POA: Diagnosis not present

## 2023-04-25 DIAGNOSIS — Z902 Acquired absence of lung [part of]: Secondary | ICD-10-CM | POA: Diagnosis not present

## 2023-04-25 DIAGNOSIS — M25519 Pain in unspecified shoulder: Secondary | ICD-10-CM | POA: Diagnosis not present

## 2023-04-25 DIAGNOSIS — Z87891 Personal history of nicotine dependence: Secondary | ICD-10-CM | POA: Diagnosis not present

## 2023-04-25 NOTE — Progress Notes (Signed)
[facility] [phone]   [phone]  OFFICE PROGRESS NOTE  Ardith Dark, MD [address] [place] [address]  DIAGNOSIS:  Stage IA (T1c, N0, M0) non-small cell lung cancer, squamous cell carcinoma presented with right upper lobe in July 2023   PRIOR THERAPY: Status post right upper lobectomy with lymph node sampling under the care of Dr. Cliffton Asters on 05/10/2022 with tumor size of 2.3 cm.   CURRENT THERAPY: Observation.  INTERVAL HISTORY: Gregory Jensen 67 y.o. male returns to the clinic today for 22-month follow-up visit.  The patient is feeling fine today with no concerning complaints except for arthralgia.  He denied having any current chest pain, shortness of breath, cough or hemoptysis.  He has no nausea, vomiting, diarrhea or constipation.  He has no headache or visual changes.  He denied having any fever or chills.  He is here today for evaluation with repeat CT scan of the chest for restaging of his disease.   MEDICAL HISTORY: Past Medical History:  Diagnosis Date   Cancer (HCC) 4 years ago   Prostate Cancer , Lung Cancer   Diabetes mellitus without complication (HCC)    type 2   Hyperlipidemia    Hypertension    Neuromuscular disorder (HCC)    neuropathy   Neuropathy    in legs causing balance issues per patient   Sleep apnea    Smoker     ALLERGIES:  is allergic to jardiance [empagliflozin].  MEDICATIONS:  Current Outpatient Medications  Medication Sig Dispense Refill   aspirin 81 MG EC tablet Take 81 mg by mouth in the morning.     atorvastatin (LIPITOR) 40 MG tablet TAKE 1 TABLET BY MOUTH EVERY DAY 90 tablet 2   Blood Glucose Monitoring Suppl (TRUE METRIX METER) w/Device KIT USE AS DIRECTED 1 kit 0   fenofibrate (TRICOR) 145 MG tablet TAKE 1 TABLET BY MOUTH EVERY DAY 90 tablet 3   glucose blood (ONETOUCH VERIO) test strip 1 each by Other route daily as needed for other. Use as instructed 100 each 4   ibuprofen  (ADVIL) 200 MG tablet Take 400-600 mg by mouth daily as needed (pain.).     losartan (COZAAR) 50 MG tablet Take 1 tablet (50 mg total) by mouth daily. 90 tablet 3   meloxicam (MOBIC) 15 MG tablet TAKE 1 TABLET (15 MG TOTAL) BY MOUTH DAILY. 30 tablet 0   metFORMIN (GLUCOPHAGE) 500 MG tablet TAKE 1 TABLET BY MOUTH TWICE A DAY WITH FOOD 180 tablet 1   OneTouch Delica Lancets 33G MISC 1 each by Does not apply route daily as needed. 100 each 4   No current facility-administered medications for this visit.    SURGICAL HISTORY:  Past Surgical History:  Procedure Laterality Date   BACK SURGERY  15 years ago   BRONCHIAL BIOPSY  05/10/2022   Procedure: BRONCHIAL BIOPSIES;  Surgeon: Leslye Peer, MD;  Location: [facility] ENDOSCOPY;  Service: Pulmonary;;   BRONCHIAL BRUSHINGS  05/10/2022   Procedure: BRONCHIAL BRUSHINGS;  Surgeon: Leslye Peer, MD;  Location: [facility] ENDOSCOPY;  Service: Pulmonary;;   BRONCHIAL NEEDLE ASPIRATION BIOPSY  05/10/2022   Procedure: BRONCHIAL NEEDLE ASPIRATION BIOPSIES;  Surgeon: Leslye Peer, MD;  Location: [facility] ENDOSCOPY;  Service: Pulmonary;;   COLONOSCOPY  2007   pt does not know MD name/normal exam per pt.   FIDUCIAL MARKER PLACEMENT  05/10/2022   Procedure: FIDUCIAL DYE MARKING;  Surgeon: Leslye Peer, MD;  Location: [facility] ENDOSCOPY;  Service: Pulmonary;;   INTERCOSTAL NERVE BLOCK Right 05/10/2022   Procedure: INTERCOSTAL NERVE BLOCK;  Surgeon: Corliss Skains, MD;  Location: [facility] OR;  Service: Thoracic;  Laterality: Right;   NODE DISSECTION Right 05/10/2022   Procedure: NODE DISSECTION;  Surgeon: Corliss Skains, MD;  Location: [facility] OR;  Service: Thoracic;  Laterality: Right;   PROSTATECTOMY  4 years ago    REVIEW OF SYSTEMS:  A comprehensive review of systems was negative except for: Musculoskeletal: positive for arthralgias   PHYSICAL EXAMINATION: General appearance: alert, cooperative, and no distress Head: Normocephalic, without obvious abnormality,  atraumatic Neck: no adenopathy, no JVD, supple, symmetrical, trachea midline, and thyroid not enlarged, symmetric, no tenderness/mass/nodules Lymph nodes: Cervical, supraclavicular, and axillary nodes normal. Resp: clear to auscultation bilaterally Back: symmetric, no curvature. ROM normal. No CVA tenderness. Cardio: regular rate and rhythm, S1, S2 normal, no murmur, click, rub or gallop GI: soft, non-tender; bowel sounds normal; no masses,  no organomegaly Extremities: extremities normal, atraumatic, no cyanosis or edema  ECOG PERFORMANCE STATUS: 1 - Symptomatic but completely ambulatory  Blood pressure 130/68, pulse 73, temperature 98.1 F (36.7 C), temperature source Oral, resp. rate 17, height 5\' 8"  (1.727 m), weight 177 lb (80.3 kg), SpO2 98%.  LABORATORY DATA: Lab Results  Component Value Date   WBC 7.8 04/21/2023   HGB 14.3 04/21/2023   HCT 40.9 04/21/2023   MCV 93.6 04/21/2023   PLT 190 04/21/2023      Chemistry      Component Value Date/Time   NA 142 04/21/2023 1016   NA 138 04/27/2022 0847   K 4.7 04/21/2023 1016   CL 106 04/21/2023 1016   CO2 28 04/21/2023 1016   BUN 17 04/21/2023 1016   BUN 19 04/27/2022 0847   CREATININE 1.10 04/21/2023 1016   CREATININE 0.97 07/23/2020 0827      Component Value Date/Time   CALCIUM 10.0 04/21/2023 1016   ALKPHOS 55 04/21/2023 1016   AST 18 04/21/2023 1016   ALT 19 04/21/2023 1016   BILITOT 0.7 04/21/2023 1016       RADIOGRAPHIC STUDIES: CT Chest W Contrast  Result Date: 04/25/2023 CLINICAL DATA:  Non-small cell lung cancer restaging * Tracking Code: BO * EXAM: CT CHEST WITH CONTRAST TECHNIQUE: Multidetector CT imaging of the chest was performed during intravenous contrast administration. RADIATION DOSE REDUCTION: This exam was performed according to the departmental dose-optimization program which includes automated exposure control, adjustment of the mA and/or kV according to patient size and/or use of iterative  reconstruction technique. CONTRAST:  75mL OMNIPAQUE IOHEXOL 300 MG/ML  SOLN COMPARISON:  01/21/2023 FINDINGS: Cardiovascular: Aortic atherosclerosis. Normal heart size. Left and right coronary artery calcifications. No pericardial effusion. Mediastinum/Nodes: Unchanged, prominent pretracheal and subcarinal lymph nodes (series 2, image 51). Thyroid gland, trachea, and esophagus demonstrate no significant findings. Lungs/Pleura: Mild centrilobular emphysema. Status post right upper lobectomy. Unchanged 0.5 cm nodule of the dependent left lower lobe, benign, requiring no specific further follow-up or characterization (series 7, image 116). No pleural effusion or pneumothorax. Upper Abdomen: No acute abnormality.  Hepatic steatosis. Musculoskeletal: No chest wall abnormality. No acute osseous findings. IMPRESSION: 1. Status post right upper lobectomy. No evidence of recurrent or metastatic disease in the chest. 2. Unchanged, prominent pretracheal and subcarinal lymph nodes. Continued attention on follow-up. 3. Emphysema. 4. Coronary artery disease. 5. Hepatic steatosis. Aortic Atherosclerosis (ICD10-I70.0) and Emphysema (ICD10-J43.9). Electronically Signed   By: Jearld Lesch M.D.   On: 04/25/2023 13:59  ASSESSMENT AND PLAN: This is a very pleasant 67 years old white male with stage IA (T1c, N0, M0) non-small cell lung cancer, squamous cell carcinoma presented with right upper lobe in July 2023 status post right upper lobectomy with lymph node sampling under the care of Dr. Cliffton Asters on 05/10/2022 with tumor size of 2.3 cm.  He is currently on observation and feeling fine with no concerning complaints except for arthralgia mainly in the shoulder area. He had repeat CT scan of the chest performed recently.  I personally and independently reviewed the scan and discussed the result with the patient today. His scan showed no concerning findings for disease recurrence or metastasis and he continues to have unchanged  0.5 cm nodule in the dependent left lower lobe likely benign. I recommended for the patient to continue on observation with repeat CT scan of the chest in 6 months. He was advised to call immediately if he has any other concerning symptoms in the interval. The patient voices understanding of current disease status and treatment options and is in agreement with the current care plan.  All questions were answered. The patient knows to call the clinic with any problems, questions or concerns. We can certainly see the patient much sooner if necessary.  The total time spent in the appointment was 20 minutes.  Disclaimer: This note was dictated with voice recognition software. Similar sounding words can inadvertently be transcribed and may not be corrected upon review.       Lab and scan before next MD visit with Dr. Arbutus Ped in 3 months.

## 2023-05-02 ENCOUNTER — Encounter: Payer: Self-pay | Admitting: Gastroenterology

## 2023-05-02 ENCOUNTER — Ambulatory Visit (AMBULATORY_SURGERY_CENTER): Payer: PPO | Admitting: Gastroenterology

## 2023-05-02 VITALS — BP 121/67 | HR 68 | Temp 99.3°F | Resp 21 | Ht 68.0 in | Wt 180.0 lb

## 2023-05-02 DIAGNOSIS — K635 Polyp of colon: Secondary | ICD-10-CM

## 2023-05-02 DIAGNOSIS — Z09 Encounter for follow-up examination after completed treatment for conditions other than malignant neoplasm: Secondary | ICD-10-CM | POA: Diagnosis not present

## 2023-05-02 DIAGNOSIS — D125 Benign neoplasm of sigmoid colon: Secondary | ICD-10-CM | POA: Diagnosis not present

## 2023-05-02 DIAGNOSIS — D127 Benign neoplasm of rectosigmoid junction: Secondary | ICD-10-CM | POA: Diagnosis not present

## 2023-05-02 DIAGNOSIS — D123 Benign neoplasm of transverse colon: Secondary | ICD-10-CM | POA: Diagnosis not present

## 2023-05-02 DIAGNOSIS — D128 Benign neoplasm of rectum: Secondary | ICD-10-CM | POA: Diagnosis not present

## 2023-05-02 DIAGNOSIS — Z86010 Personal history of colonic polyps: Secondary | ICD-10-CM | POA: Diagnosis not present

## 2023-05-02 DIAGNOSIS — K6389 Other specified diseases of intestine: Secondary | ICD-10-CM | POA: Diagnosis not present

## 2023-05-02 MED ORDER — SODIUM CHLORIDE 0.9 % IV SOLN: 500.0000 mL | Freq: Once | INTRAVENOUS | Status: DC

## 2023-05-02 NOTE — Progress Notes (Signed)
To pacu, VSS. Report to [email or web address]

## 2023-05-02 NOTE — Patient Instructions (Signed)
Handouts provided on polyps and hemorrhoids.   Resume previous diet.  Continue present medications.  Await pathology results.   YOU HAD AN ENDOSCOPIC PROCEDURE TODAY AT THE [facility]:   Refer to the procedure report that was given to you for any specific questions about what was found during the examination.  If the procedure report does not answer your questions, please call your gastroenterologist to clarify.  If you requested that your care partner not be given the details of your procedure findings, then the procedure report has been included in a sealed envelope for you to review at your convenience later.  YOU SHOULD EXPECT: Some feelings of bloating in the abdomen. Passage of more gas than usual.  Walking can help get rid of the air that was put into your GI tract during the procedure and reduce the bloating. If you had a lower endoscopy (such as a colonoscopy or flexible sigmoidoscopy) you may notice spotting of blood in your stool or on the toilet paper. If you underwent a bowel prep for your procedure, you may not have a normal bowel movement for a few days.  Please Note:  You might notice some irritation and congestion in your nose or some drainage.  This is from the oxygen used during your procedure.  There is no need for concern and it should clear up in a day or so.  SYMPTOMS TO REPORT IMMEDIATELY:  Following lower endoscopy (colonoscopy or flexible sigmoidoscopy):  Excessive amounts of blood in the stool  Significant tenderness or worsening of abdominal pains  Swelling of the abdomen that is new, acute  Fever of 100F or higher  For urgent or emergent issues, a gastroenterologist can be reached at any hour by calling [phone]. Do not use MyChart messaging for urgent concerns.    DIET:  We do recommend a small meal at first, but then you may proceed to your regular diet.  Drink plenty of fluids but you should avoid alcoholic beverages for 24  hours.  ACTIVITY:  You should plan to take it easy for the rest of today and you should NOT DRIVE or use heavy machinery until tomorrow (because of the sedation medicines used during the test).    FOLLOW UP: Our staff will call the number listed on your records the next business day following your procedure.  We will call around 7:15- 8:00 am to check on you and address any questions or concerns that you may have regarding the information given to you following your procedure. If we do not reach you, we will leave a message.     If any biopsies were taken you will be contacted by phone or by letter within the next 1-3 weeks.  Please call us at [phone] if you have not heard about the biopsies in 3 weeks.    SIGNATURES/CONFIDENTIALITY: You and/or your care partner have signed paperwork which will be entered into your electronic medical record.  These signatures attest to the fact that that the information above on your After Visit Summary has been reviewed and is understood.  Full responsibility of the confidentiality of this discharge information lies with you and/or your care-partner.

## 2023-05-02 NOTE — Progress Notes (Signed)
Called to room to assist during endoscopic procedure.  Patient ID and intended procedure confirmed with present staff. Received instructions for my participation in the procedure from the performing physician.  

## 2023-05-02 NOTE — Progress Notes (Signed)
Pt's states no medical or surgical changes since previsit or office visit. 

## 2023-05-02 NOTE — Progress Notes (Signed)
See 9/9 H&P no changes

## 2023-05-02 NOTE — Op Note (Signed)
[facility] Patient Name: Gregory Jensen Procedure Date: 05/02/2023 9:11 AM MRN: [id number] Endoscopist: Meryl Dare , MD, [phone] Age: 67 Referring MD:  Date of Birth: 1956-04-22 Gender: Male Account #: [id number] Procedure:                Colonoscopy Indications:              Surveillance: Personal history of adenomatous                            polyps on last colonoscopy > 5 years ago Medicines:                Monitored Anesthesia Care Procedure:                Pre-Anesthesia Assessment:                           - Prior to the procedure, a History and Physical                            was performed, and patient medications and                            allergies were reviewed. The patient's tolerance of                            previous anesthesia was also reviewed. The risks                            and benefits of the procedure and the sedation                            options and risks were discussed with the patient.                            All questions were answered, and informed consent                            was obtained. Prior Anticoagulants: The patient has                            taken no anticoagulant or antiplatelet agents. ASA                            Grade Assessment: III - A patient with severe                            systemic disease. After reviewing the risks and                            benefits, the patient was deemed in satisfactory                            condition to undergo the procedure.  After obtaining informed consent, the colonoscope                            was passed under direct vision. Throughout the                            procedure, the patient's blood pressure, pulse, and                            oxygen saturations were monitored continuously. The                            Olympus CF-HQ190L [id number]) Colonoscope was                            introduced through  the anus and advanced to the the                            cecum, identified by appendiceal orifice and                            ileocecal valve. The ileocecal valve, appendiceal                            orifice, and rectum were photographed. The quality                            of the bowel preparation was good. The colonoscopy                            was performed without difficulty. The patient                            tolerated the procedure well. The quality of the                            bowel preparation was good. The ileocecal valve,                            appendiceal orifice, and rectum were photographed. Scope In: 9:23:04 AM Scope Out: 9:40:49 AM Scope Withdrawal Time: 0 hours 14 minutes 59 seconds  Total Procedure Duration: 0 hours 17 minutes 45 seconds  Findings:                 The perianal and digital rectal examinations were                            normal.                           Five sessile polyps were found in the rectum (1),                            sigmoid colon (1) and transverse colon (3). The  polyps were 6 to 8 mm in size. These polyps were                            removed with a cold snare. Resection and retrieval                            were complete.                           Internal hemorrhoids were found during                            retroflexion. The hemorrhoids were small and Grade                            I (internal hemorrhoids that do not prolapse).                           The exam was otherwise without abnormality on                            direct and retroflexion views. Complications:            No immediate complications. Estimated blood loss:                            None. Estimated Blood Loss:     Estimated blood loss: none. Impression:               - Five 6 to 8 mm polyps in the rectum, in the                            sigmoid colon and in the transverse colon, removed                             with a cold snare. Resected and retrieved.                           - Internal hemorrhoids.                           - The examination was otherwise normal on direct                            and retroflexion views. Recommendation:           - Repeat colonoscopy after studies are complete for                            surveillance based on pathology results.                           - Patient has a contact number available for                            emergencies. The signs  and symptoms of potential                            delayed complications were discussed with the                            patient. Return to normal activities tomorrow.                            Written discharge instructions were provided to the                            patient.                           - Resume previous diet.                           - Continue present medications.                           - Await pathology results. Meryl Dare, MD 05/02/2023 9:45:48 AM This report has been signed electronically.

## 2023-05-03 ENCOUNTER — Telehealth: Payer: Self-pay

## 2023-05-03 NOTE — Telephone Encounter (Signed)
Follow up call to pt, no answer. 

## 2023-05-04 ENCOUNTER — Encounter: Payer: Self-pay | Admitting: Family Medicine

## 2023-05-05 ENCOUNTER — Encounter: Payer: Self-pay | Admitting: Family Medicine

## 2023-05-05 LAB — HM DIABETES EYE EXAM

## 2023-05-17 ENCOUNTER — Encounter: Payer: Self-pay | Admitting: Gastroenterology

## 2023-05-29 ENCOUNTER — Other Ambulatory Visit (HOSPITAL_BASED_OUTPATIENT_CLINIC_OR_DEPARTMENT_OTHER): Payer: Self-pay | Admitting: Internal Medicine

## 2023-06-11 ENCOUNTER — Other Ambulatory Visit: Payer: Self-pay | Admitting: Family Medicine

## 2023-07-21 ENCOUNTER — Ambulatory Visit (INDEPENDENT_AMBULATORY_CARE_PROVIDER_SITE_OTHER): Payer: PPO | Admitting: Family Medicine

## 2023-07-21 ENCOUNTER — Encounter: Payer: Self-pay | Admitting: Family Medicine

## 2023-07-21 VITALS — BP 128/74 | HR 75 | Temp 97.3°F | Ht 68.0 in | Wt 181.4 lb

## 2023-07-21 DIAGNOSIS — M79602 Pain in left arm: Secondary | ICD-10-CM | POA: Diagnosis not present

## 2023-07-21 DIAGNOSIS — E119 Type 2 diabetes mellitus without complications: Secondary | ICD-10-CM

## 2023-07-21 DIAGNOSIS — G1220 Motor neuron disease, unspecified: Secondary | ICD-10-CM | POA: Diagnosis not present

## 2023-07-21 DIAGNOSIS — L570 Actinic keratosis: Secondary | ICD-10-CM | POA: Diagnosis not present

## 2023-07-21 DIAGNOSIS — E785 Hyperlipidemia, unspecified: Secondary | ICD-10-CM

## 2023-07-21 DIAGNOSIS — Z7984 Long term (current) use of oral hypoglycemic drugs: Secondary | ICD-10-CM | POA: Diagnosis not present

## 2023-07-21 DIAGNOSIS — I152 Hypertension secondary to endocrine disorders: Secondary | ICD-10-CM

## 2023-07-21 DIAGNOSIS — E1169 Type 2 diabetes mellitus with other specified complication: Secondary | ICD-10-CM

## 2023-07-21 DIAGNOSIS — G6289 Other specified polyneuropathies: Secondary | ICD-10-CM

## 2023-07-21 DIAGNOSIS — E1159 Type 2 diabetes mellitus with other circulatory complications: Secondary | ICD-10-CM | POA: Diagnosis not present

## 2023-07-21 LAB — VITAMIN B12: Vitamin B-12: 174 pg/mL — ABNORMAL LOW (ref 211–911)

## 2023-07-21 LAB — LIPID PANEL
Cholesterol: 104 mg/dL (ref 0–200)
HDL: 21 mg/dL — ABNORMAL LOW (ref >39.00)
LDL Cholesterol: 29 mg/dL (ref 0–99)
NonHDL: 82.68
Total CHOL/HDL Ratio: 5
Triglycerides: 270 mg/dL — ABNORMAL HIGH (ref 0.0–149.0)
VLDL: 54 mg/dL — ABNORMAL HIGH (ref 0.0–40.0)

## 2023-07-21 LAB — MICROALBUMIN / CREATININE URINE RATIO
Creatinine,U: 128.2 mg/dL
Microalb Creat Ratio: 8.2 mg/g (ref 0.0–30.0)
Microalb, Ur: 10.6 mg/dL — ABNORMAL HIGH (ref 0.0–1.9)

## 2023-07-21 LAB — CBC
HCT: 43.5 % (ref 39.0–52.0)
Hemoglobin: 15.1 g/dL (ref 13.0–17.0)
MCHC: 34.8 g/dL (ref 30.0–36.0)
MCV: 95.2 fL (ref 78.0–100.0)
Platelets: 207 10*3/uL (ref 150.0–400.0)
RBC: 4.57 Mil/uL (ref 4.22–5.81)
RDW: 13.1 % (ref 11.5–15.5)
WBC: 9.3 10*3/uL (ref 4.0–10.5)

## 2023-07-21 LAB — COMPREHENSIVE METABOLIC PANEL
ALT: 19 U/L (ref 0–53)
AST: 20 U/L (ref 0–37)
Albumin: 4.7 g/dL (ref 3.5–5.2)
Alkaline Phosphatase: 49 U/L (ref 39–117)
BUN: 21 mg/dL (ref 6–23)
CO2: 24 meq/L (ref 19–32)
Calcium: 9.9 mg/dL (ref 8.4–10.5)
Chloride: 104 meq/L (ref 96–112)
Creatinine, Ser: 1.08 mg/dL (ref 0.40–1.50)
GFR: 70.58 mL/min (ref >60.00)
Glucose, Bld: 117 mg/dL — ABNORMAL HIGH (ref 70–99)
Potassium: 3.9 meq/L (ref 3.5–5.1)
Sodium: 137 meq/L (ref 135–145)
Total Bilirubin: 0.6 mg/dL (ref 0.2–1.2)
Total Protein: 7.6 g/dL (ref 6.0–8.3)

## 2023-07-21 LAB — TSH: TSH: 2.95 u[IU]/mL (ref 0.35–5.50)

## 2023-07-21 LAB — POCT GLYCOSYLATED HEMOGLOBIN (HGB A1C): Hemoglobin A1C: 6.3 % — AB (ref 4.0–5.6)

## 2023-07-21 NOTE — Assessment & Plan Note (Signed)
Did not respond to cryotherapy.  He does have a dermatologist and will schedule an appointment with them soon.

## 2023-07-21 NOTE — Assessment & Plan Note (Signed)
His symptoms have continued to progress since our last visit.  He has seen neurology for this and had a extensive workup including labs, lumbar MRI, and lumbar puncture without obvious etiology.  We did discuss referring him to a tertiary care center or having him follow back up with neurology however it will likely be several weeks to months before he can be seen again.  Will recheck labs today however would be reasonable for [place] to image his entire CNS at this point especially in light of his above left arm radicular symptoms and history of lung cancer.  I will order MRI brain, C-spine, T-spine, and L-spine.  He will need to follow-up with neurology however we will go ahead and start with the with imaging above.  We discussed reasons to return to care.

## 2023-07-21 NOTE — Assessment & Plan Note (Signed)
Blood pressure at goal today on losartan 50 mg daily.

## 2023-07-21 NOTE — Assessment & Plan Note (Signed)
Follows with cardiology.  Check lipids.  He is on Lipitor 40 mg daily and fenofibrate 145 mcg daily.

## 2023-07-21 NOTE — Progress Notes (Signed)
Gregory Jensen is a 67 y.o. male who presents today for an office visit.  Assessment/Plan:  Chronic Problems Addressed Today: Diabetes mellitus without complication (HCC) A1c stable at 6.3.  Will continue metformin 500 mg twice daily.  Recheck again in 3 to 6 months.  Pain of left upper extremity Pain has continued to progress since our last visit and he did not have any improvement with the shoulder injection performed at sports medicine.  On review of their notes they had discussed obtaining MRI as a neck step.  We did discuss having him follow back up with the specialist however we will proceed with ordering the imaging as we can likely get this done quicker than he can get back in to see them.   Peripheral neuropathy His symptoms have continued to progress since our last visit.  He has seen neurology for this and had a extensive workup including labs, lumbar MRI, and lumbar puncture without obvious etiology.  We did discuss referring him to a tertiary care center or having him follow back up with neurology however it will likely be several weeks to months before he can be seen again.  Will recheck labs today however would be reasonable for [place] to image his entire CNS at this point especially in light of his above left arm radicular symptoms and history of lung cancer.  I will order MRI brain, C-spine, T-spine, and L-spine.  He will need to follow-up with neurology however we will go ahead and start with the with imaging above.  We discussed reasons to return to care.  Actinic keratosis Did not respond to cryotherapy.  He does have a dermatologist and will schedule an appointment with them soon.   Dyslipidemia associated with type 2 diabetes mellitus (HCC) Follows with cardiology.  Check lipids.  He is on Lipitor 40 mg daily and fenofibrate 145 mcg daily.  Hypertension associated with diabetes (HCC) Blood pressure at goal today on losartan 50 mg daily     Subjective:  HPI:  See  A/P for status of chronic conditions.  Patient is here today for diabetes follow-up.  We saw him last 6 months ago.  At that time A1c was 6.2 on metformin 500 mg twice daily.  Since her last visit he did undergo colonoscopy.  They will repeat this in 3 years.  At our last visit he was also having some left arm pain.  There was concern for possible osteoarthritis versus rotator cuff tendinopathy at that time.  We started him on meloxicam 15 mg daily and home exercise program.  Symptoms did not improve and referred him to sports medicine.  He had subacromial injection performed without much benefit.  X-ray did show some degenerative changes.  They had discussed referral to see a shoulder specialist versus imaging at that time however he declined.  His symptoms have continued to progress since then.  He has not seen sports medicine for the last several months.  Pain seems to be moving more towards the base of his neck at this point.  Worse with certain motions.  Patient also has had worsening bilateral lower extremity neuropathy symptoms for the last several months.  He has had a extensive workup with neurology for this in the past however has not seen them for multiple years.  Feels unsteady with gait.  Feels like he could fall at any moment.        Objective:  Physical Exam: BP 128/74   Pulse 75   Temp Marland Kitchen)  97.3 F (36.3 C) (Temporal)   Ht 5\' 8"  (1.727 m)   Wt 181 lb 6.4 oz (82.3 kg)   SpO2 99%   BMI 27.58 kg/m   Gen: No acute distress, resting comfortably CV: Regular rate and rhythm with no murmurs appreciated Pulm: Normal work of breathing, clear to auscultation bilaterally with no crackles, wheezes, or rhonchi MUSCULOSKELETAL: - Neck: No deformities.  Tender to palpation along lower cervical left paraspinal muscles. Neuro: Grossly normal, moves all extremities. Psych: Normal affect and thought content  Time Spent: 45 minutes of total time was spent on the date of the encounter performing  the following actions: chart review prior to seeing the patient, obtaining history, performing a medically necessary exam, reviewing previous workup and notes with neurology and sports medicine, counseling on the treatment plan, placing orders, and documenting in our EHR.        Katina Degree. [name], MD 07/21/2023 10:22 AM

## 2023-07-21 NOTE — Patient Instructions (Signed)
It was very nice to see you today!  Your sugar is well-controlled.  We will continue your current medications.  We will check an MRI to look for any possible causes for your neck pain and neuropathy.  Please let us know if you have any change in symptoms.  Will check blood work today.  Return in about 6 months (around 01/19/2024) for Follow Up.   Take care, Dr [name]  PLEASE NOTE:  If you had any lab tests, please let us know if you have not heard back within a few days. You may see your results on mychart before we have a chance to review them but we will give you a call once they are reviewed by [place].   If we ordered any referrals today, please let us know if you have not heard from their office within the next week.   If you had any urgent prescriptions sent in today, please check with the pharmacy within an hour of our visit to make sure the prescription was transmitted appropriately.   Please try these tips to maintain a healthy lifestyle:  Eat at least 3 REAL meals and 1-2 snacks per day.  Aim for no more than 5 hours between eating.  If you eat breakfast, please do so within one hour of getting up.   Each meal should contain half fruits/vegetables, one quarter protein, and one quarter carbs (no bigger than a computer mouse)  Cut down on sweet beverages. This includes juice, soda, and sweet tea.   Drink at least 1 glass of water with each meal and aim for at least 8 glasses per day  Exercise at least 150 minutes every week.

## 2023-07-21 NOTE — Assessment & Plan Note (Signed)
A1c stable at 6.3.  Will continue metformin 500 mg twice daily.  Recheck again in 3 to 6 months.

## 2023-07-21 NOTE — Assessment & Plan Note (Signed)
Pain has continued to progress since our last visit and he did not have any improvement with the shoulder injection performed at sports medicine.  On review of their notes they had discussed obtaining MRI as a neck step.  We did discuss having him follow back up with the specialist however we will proceed with ordering the imaging as we can likely get this done quicker than he can get back in to see them.

## 2023-07-22 NOTE — Progress Notes (Signed)
His B12 is low.  This is probably contributing some to his symptoms.  Recommend that he start B12 protocol here.  The rest of his labs are all stable.  Do not need to make any other adjustments to his treatment plan at this time.  We can recheck everything in a year or so.

## 2023-07-25 DIAGNOSIS — E113291 Type 2 diabetes mellitus with mild nonproliferative diabetic retinopathy without macular edema, right eye: Secondary | ICD-10-CM | POA: Diagnosis not present

## 2023-07-25 DIAGNOSIS — H35033 Hypertensive retinopathy, bilateral: Secondary | ICD-10-CM | POA: Diagnosis not present

## 2023-07-25 DIAGNOSIS — H2513 Age-related nuclear cataract, bilateral: Secondary | ICD-10-CM | POA: Diagnosis not present

## 2023-07-25 DIAGNOSIS — H5213 Myopia, bilateral: Secondary | ICD-10-CM | POA: Diagnosis not present

## 2023-07-27 ENCOUNTER — Ambulatory Visit (INDEPENDENT_AMBULATORY_CARE_PROVIDER_SITE_OTHER): Payer: PPO | Admitting: *Deleted

## 2023-07-27 DIAGNOSIS — E538 Deficiency of other specified B group vitamins: Secondary | ICD-10-CM

## 2023-07-27 MED ORDER — CYANOCOBALAMIN 1000 MCG/ML IJ SOLN: 1000.0000 ug | INTRAMUSCULAR | 0 refills | Status: DC

## 2023-07-27 MED ORDER — CYANOCOBALAMIN 1000 MCG/ML IJ SOLN
1000.0000 ug | Freq: Once | INTRAMUSCULAR | Status: AC
Start: 2023-07-27 — End: 2023-07-27
  Administered 2023-07-27: 1000 ug via INTRAMUSCULAR

## 2023-07-27 NOTE — Progress Notes (Signed)
Patient presents for B12 injection today. Patient received her B12 injection in Right Deltoid. Patient tolerated injection well.  Documentation entered in MAR in EpicCare.   

## 2023-07-31 ENCOUNTER — Ambulatory Visit (HOSPITAL_COMMUNITY)
Admission: RE | Admit: 2023-07-31 | Discharge: 2023-07-31 | Disposition: A | Discharge status: Home / Self Care | Payer: PPO | Source: Ambulatory Visit | Attending: Family Medicine | Admitting: Family Medicine

## 2023-07-31 DIAGNOSIS — M4807 Spinal stenosis, lumbosacral region: Secondary | ICD-10-CM | POA: Diagnosis not present

## 2023-07-31 DIAGNOSIS — D1809 Hemangioma of other sites: Secondary | ICD-10-CM | POA: Diagnosis not present

## 2023-07-31 DIAGNOSIS — M50221 Other cervical disc displacement at C4-C5 level: Secondary | ICD-10-CM | POA: Diagnosis not present

## 2023-07-31 DIAGNOSIS — M549 Dorsalgia, unspecified: Secondary | ICD-10-CM | POA: Diagnosis not present

## 2023-07-31 DIAGNOSIS — G1220 Motor neuron disease, unspecified: Secondary | ICD-10-CM | POA: Insufficient documentation

## 2023-07-31 DIAGNOSIS — M40204 Unspecified kyphosis, thoracic region: Secondary | ICD-10-CM | POA: Diagnosis not present

## 2023-07-31 DIAGNOSIS — M4856XA Collapsed vertebra, not elsewhere classified, lumbar region, initial encounter for fracture: Secondary | ICD-10-CM | POA: Diagnosis not present

## 2023-07-31 DIAGNOSIS — M4802 Spinal stenosis, cervical region: Secondary | ICD-10-CM | POA: Diagnosis not present

## 2023-07-31 DIAGNOSIS — M48061 Spinal stenosis, lumbar region without neurogenic claudication: Secondary | ICD-10-CM | POA: Diagnosis not present

## 2023-07-31 DIAGNOSIS — M5031 Other cervical disc degeneration,  high cervical region: Secondary | ICD-10-CM | POA: Diagnosis not present

## 2023-07-31 DIAGNOSIS — M47812 Spondylosis without myelopathy or radiculopathy, cervical region: Secondary | ICD-10-CM | POA: Diagnosis not present

## 2023-08-03 ENCOUNTER — Ambulatory Visit: Payer: PPO

## 2023-08-03 DIAGNOSIS — E538 Deficiency of other specified B group vitamins: Secondary | ICD-10-CM

## 2023-08-03 MED ORDER — CYANOCOBALAMIN 1000 MCG/ML IJ SOLN
1000.0000 ug | Freq: Once | INTRAMUSCULAR | Status: AC
  Administered 2023-08-03: 1000 ug via INTRAMUSCULAR

## 2023-08-03 NOTE — Progress Notes (Signed)
Patient is in office today for a nurse visit for B12 Injection. Patient Injection was given in the  Right deltoid. Patient tolerated injection well.

## 2023-08-09 ENCOUNTER — Ambulatory Visit (INDEPENDENT_AMBULATORY_CARE_PROVIDER_SITE_OTHER): Payer: PPO

## 2023-08-09 VITALS — Wt 181.0 lb

## 2023-08-09 DIAGNOSIS — Z0000 Encounter for general adult medical examination without abnormal findings: Secondary | ICD-10-CM

## 2023-08-09 NOTE — Progress Notes (Signed)
Subjective:   Gregory Jensen is a 67 y.o. male who presents for Medicare Annual/Subsequent preventive examination.  Visit Complete: Virtual I connected with  Synetta Fail on 08/09/23 by a audio enabled telemedicine application and verified that I am speaking with the correct person using two identifiers.  Patient Location: Home  Provider Location: Office/Clinic  I discussed the limitations of evaluation and management by telemedicine. The patient expressed understanding and agreed to proceed.  Vital Signs: Because this visit was a virtual/telehealth visit, some criteria may be missing or patient reported. Any vitals not documented were not able to be obtained and vitals that have been documented are patient reported.   Cardiac Risk Factors include: advanced age (>88men, >88 women);diabetes mellitus;dyslipidemia;male gender;hypertension     Objective:    Today's Vitals   08/09/23 0947  Weight: 181 lb (82.1 kg)   Body mass index is 27.52 kg/m.     08/09/2023    9:51 AM 08/05/2022    8:39 AM 05/11/2022   10:00 PM 05/06/2022    1:04 PM 05/14/2021    1:21 PM 03/01/2016    7:22 AM 02/16/2016    4:38 PM  Advanced Directives  Does Patient Have a Medical Advance Directive? No Yes No No No No No  Type of Furniture conservator/restorer;Living will       Copy of Healthcare Power of Attorney in Chart?  No - copy requested       Would patient like information on creating a medical advance directive? No - Patient declined  No - Patient declined No - Patient declined No - Patient declined      Current Medications (verified) Outpatient Encounter Medications as of 08/09/2023  Medication Sig   aspirin 81 MG EC tablet Take 81 mg by mouth in the morning.   atorvastatin (LIPITOR) 40 MG tablet TAKE 1 TABLET BY MOUTH EVERY DAY   Blood Glucose Monitoring Suppl (TRUE METRIX METER) w/Device KIT USE AS DIRECTED   fenofibrate (TRICOR) 145 MG tablet TAKE 1 TABLET BY MOUTH  EVERY DAY   glucose blood (ONETOUCH VERIO) test strip 1 each by Other route daily as needed for other. Use as instructed   ibuprofen (ADVIL) 200 MG tablet Take 400-600 mg by mouth daily as needed (pain.).   losartan (COZAAR) 25 MG tablet TAKE 1 TABLET EVERY DAY   meloxicam (MOBIC) 15 MG tablet TAKE 1 TABLET (15 MG TOTAL) BY MOUTH DAILY.   metFORMIN (GLUCOPHAGE) 500 MG tablet TAKE 1 TABLET BY MOUTH TWICE A DAY WITH FOOD   OneTouch Delica Lancets 33G MISC 1 each by Does not apply route daily as needed.   No facility-administered encounter medications on file as of 08/09/2023.    Allergies (verified) Jardiance [empagliflozin]   History: Past Medical History:  Diagnosis Date   Cancer (HCC) 4 years ago   Prostate Cancer , Lung Cancer   Diabetes mellitus without complication (HCC)    type 2   Hyperlipidemia    Hypertension    Neuromuscular disorder (HCC)    neuropathy   Neuropathy    in legs causing balance issues per patient   Sleep apnea    Smoker    Past Surgical History:  Procedure Laterality Date   BACK SURGERY  15 years ago   BRONCHIAL BIOPSY  05/10/2022   Procedure: BRONCHIAL BIOPSIES;  Surgeon: Leslye Peer, MD;  Location: [facility] ENDOSCOPY;  Service: Pulmonary;;   BRONCHIAL BRUSHINGS  05/10/2022   Procedure: BRONCHIAL BRUSHINGS;  Surgeon: Leslye Peer, MD;  Location: [facility] ENDOSCOPY;  Service: Pulmonary;;   BRONCHIAL NEEDLE ASPIRATION BIOPSY  05/10/2022   Procedure: BRONCHIAL NEEDLE ASPIRATION BIOPSIES;  Surgeon: Leslye Peer, MD;  Location: [facility] ENDOSCOPY;  Service: Pulmonary;;   COLONOSCOPY  2007   pt does not know MD name/normal exam per pt.   FIDUCIAL MARKER PLACEMENT  05/10/2022   Procedure: FIDUCIAL DYE MARKING;  Surgeon: Leslye Peer, MD;  Location: [facility] ENDOSCOPY;  Service: Pulmonary;;   INTERCOSTAL NERVE BLOCK Right 05/10/2022   Procedure: INTERCOSTAL NERVE BLOCK;  Surgeon: Corliss Skains, MD;  Location: [facility] OR;  Service: Thoracic;  Laterality: Right;    NODE DISSECTION Right 05/10/2022   Procedure: NODE DISSECTION;  Surgeon: Corliss Skains, MD;  Location: [facility] OR;  Service: Thoracic;  Laterality: Right;   PROSTATECTOMY  4 years ago   Family History  Problem Relation Age of Onset   Alzheimer's disease Mother    Heart disease Father    Hypertension Sister    Heart disease Sister    Hypertension Sister    Stomach cancer Brother    Diabetes Other        family hx   Colon cancer Neg Hx    Colon polyps Neg Hx    Esophageal cancer Neg Hx    Rectal cancer Neg Hx    Social History   Socioeconomic History   Marital status: Significant Other    Spouse name: Elease Hashimoto   Number of children: 1   Years of education: 12   Highest education level: Not on file  Occupational History   Occupation: [employer]    Comment: Retired  Tobacco Use   Smoking status: Every Day    Current packs/day: 1.00    Average packs/day: 1 pack/day for 47.0 years (47.0 ttl pk-yrs)    Types: Cigarettes   Smokeless tobacco: Never   Tobacco comments:    1 pack of cigarettes smoked daily ARJ 07/27/22  Vaping Use   Vaping status: Never Used  Substance and Sexual Activity   Alcohol use: Not Currently   Drug use: Never   Sexual activity: Not on file  Other Topics Concern   Not on file  Social History Narrative   Lives with wife. Has one son who lives in [place].    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     Social Drivers of Corporate investment banker Strain: Low Risk  (08/09/2023)   Overall Financial Resource Strain (CARDIA)    Difficulty of Paying Living Expenses: Not hard at all  Food Insecurity: No Food Insecurity (08/09/2023)   Hunger Vital Sign    Worried About Running Out of Food in the Last Year: Never true    Ran Out of Food in the Last Year: Never true  Transportation Needs: No Transportation Needs (08/09/2023)   PRAPARE - Administrator, Civil Service (Medical): No    Lack of  Transportation (Non-Medical): No  Physical Activity: Insufficiently Active (08/09/2023)   Exercise Vital Sign    Days of Exercise per Week: 5 days    Minutes of Exercise per Session: 10 min  Stress: No Stress Concern Present (08/09/2023)   [employer] of Occupational Health - Occupational Stress Questionnaire    Feeling of Stress : Not at all  Social Connections: Moderately Isolated (08/09/2023)   Social Connection and Isolation Panel [NHANES]    Frequency of Communication with Friends and Family: More  than three times a week    Frequency of Social Gatherings with Friends and Family: More than three times a week    Attends Religious Services: Never    Database administrator or Organizations: No    Attends Engineer, structural: Never    Marital Status: Married    Tobacco Counseling Ready to quit: Not Answered Counseling given: Not Answered Tobacco comments: 1 pack of cigarettes smoked daily ARJ 07/27/22   Clinical Intake:  Pre-visit preparation completed: Yes  Pain : No/denies pain     BMI - recorded: 27.52 Nutritional Status: BMI 25 -29 Overweight Nutritional Risks: None Diabetes: Yes CBG done?: No Did pt. bring in CBG monitor from home?: No  How often do you need to have someone help you when you read instructions, pamphlets, or other written materials from your doctor or pharmacy?: 1 - Never  Interpreter Needed?: No  Information entered by :: Lanier Ensign, LPN   Activities of Daily Living    08/09/2023    9:48 AM  In your present state of health, do you have any difficulty performing the following activities:  Hearing? 1  Comment HOH  Vision? 0  Difficulty concentrating or making decisions? 0  Walking or climbing stairs? 1  Comment balance issues  Dressing or bathing? 0  Doing errands, shopping? 0  Preparing Food and eating ? N  Using the Toilet? N  In the past six months, have you accidently leaked urine? N  Do you have problems with  loss of bowel control? N  Managing your Medications? N  Managing your Finances? N  Housekeeping or managing your Housekeeping? N    Patient Care Team: Ardith Dark, MD as PCP - General (Family Medicine)  Indicate any recent Medical Services you may have received from other than [facility] providers in the past year (date may be approximate).     Assessment:   This is a routine wellness examination for Loic.  Hearing/Vision screen Hearing Screening - Comments:: Pt stated Hoh  Vision Screening - Comments:: Pt follows up with Dr Burley Saver for annual eye exams    Goals Addressed             This Visit's Progress    Patient Stated       Maintain health and increase activity        Depression Screen    08/09/2023    9:51 AM 07/21/2023    9:31 AM 01/19/2023    8:54 AM 08/05/2022    8:38 AM 07/20/2022    9:31 AM 02/17/2022    9:16 AM 07/28/2021    8:52 AM  PHQ 2/9 Scores  PHQ - 2 Score 0 0 0 0 0 0 0    Fall Risk    08/09/2023    9:52 AM 07/21/2023    9:31 AM 01/19/2023    8:54 AM 08/05/2022    8:41 AM 07/20/2022    9:30 AM  Fall Risk   Falls in the past year? 1 1 1  0 0  Number falls in past yr: 1 1 0 0 0  Injury with Fall? 0 0 0 0 0  Risk for fall due to : History of fall(s);Impaired balance/gait Impaired balance/gait No Fall Risks;Impaired balance/gait Impaired balance/gait;Impaired vision;Impaired mobility   Risk for fall due to: Comment  walk wiith cane     Follow up Falls prevention discussed   Falls prevention discussed     MEDICARE RISK AT HOME: Medicare Risk at  Home Any stairs in or around the home?: Yes If so, are there any without handrails?: No Home free of loose throw rugs in walkways, pet beds, electrical cords, etc?: Yes Adequate lighting in your home to reduce risk of falls?: Yes Life alert?: No Use of a cane, walker or w/c?: Yes Grab bars in the bathroom?: No Shower chair or bench in shower?: Yes Elevated toilet seat or a handicapped toilet?:  No  TIMED UP AND GO:  Was the test performed?  No    Cognitive Function:        08/09/2023    9:53 AM 08/05/2022    8:42 AM 05/14/2021    1:24 PM  6CIT Screen  What Year? 0 points 0 points 0 points  What month? 0 points 0 points 0 points  What time? 0 points 0 points 0 points  Count back from 20 0 points 0 points 0 points  Months in reverse 0 points 0 points 0 points  Repeat phrase 0 points 2 points 0 points  Total Score 0 points 2 points 0 points    Immunizations Immunization History  Administered Date(s) Administered   PFIZER Comirnaty(Gray Top)Covid-19 Tri-Sucrose Vaccine 09/12/2020   PFIZER(Purple Top)SARS-COV-2 Vaccination 03/20/2020, 04/10/2020   Tdap 12/19/2013    TDAP status: Up to date  Flu Vaccine status: Declined, Education has been provided regarding the importance of this vaccine but patient still declined. Advised may receive this vaccine at local pharmacy or Health Dept. Aware to provide a copy of the vaccination record if obtained from local pharmacy or Health Dept. Verbalized acceptance and understanding.  Pneumococcal vaccine status: Due, Education has been provided regarding the importance of this vaccine. Advised may receive this vaccine at local pharmacy or Health Dept. Aware to provide a copy of the vaccination record if obtained from local pharmacy or Health Dept. Verbalized acceptance and understanding.  Covid-19 vaccine status: Information provided on how to obtain vaccines.   Qualifies for Shingles Vaccine? Yes   Zostavax completed No   Shingrix Completed?: No.    Education has been provided regarding the importance of this vaccine. Patient has been advised to call insurance company to determine out of pocket expense if they have not yet received this vaccine. Advised may also receive vaccine at local pharmacy or Health Dept. Verbalized acceptance and understanding.  Screening Tests Health Maintenance  Topic Date Due   FOOT EXAM  01/18/2020    Zoster Vaccines- Shingrix (1 of 2) 10/19/2023 (Originally 03/09/1975)   INFLUENZA VACCINE  11/14/2023 (Originally 03/17/2023)   Pneumonia Vaccine 85+ Years old (1 of 2 - PCV) 01/19/2024 (Originally 03/09/1962)   DTaP/Tdap/Td (2 - Td or Tdap) 12/20/2023   HEMOGLOBIN A1C  01/19/2024   OPHTHALMOLOGY EXAM  05/04/2024   Diabetic kidney evaluation - eGFR measurement  07/20/2024   Diabetic kidney evaluation - Urine ACR  07/20/2024   Medicare Annual Wellness (AWV)  08/08/2024   Colonoscopy  05/01/2026   Hepatitis C Screening  Completed   HPV VACCINES  Aged Out   Lung Cancer Screening  Discontinued   COVID-19 Vaccine  Discontinued    Health Maintenance  Health Maintenance Due  Topic Date Due   FOOT EXAM  01/18/2020    Colorectal cancer screening: Type of screening: Colonoscopy. Completed 05/02/23. Repeat every 3 years  Lung Cancer Screening: (Low Dose CT Chest recommended if Age 54-80 years, 20 pack-year currently smoking OR have quit w/in 15years.) does qualify.   Lung Cancer Screening Referral: completed 04/21/23  Additional Screening:  Hepatitis C Screening: Completed 01/16/18  Vision Screening: Recommended annual ophthalmology exams for early detection of glaucoma and other disorders of the eye. Is the patient up to date with their annual eye exam?  Yes  Who is the provider or what is the name of the office in which the patient attends annual eye exams? 05/05/23 If pt is not established with a provider, would they like to be referred to a provider to establish care? No .   Dental Screening: Recommended annual dental exams for proper oral hygiene  Diabetic Foot Exam: Diabetic Foot Exam: Overdue, Pt has been advised about the importance in completing this exam. Pt is scheduled for diabetic foot exam on next appt .  Community Resource Referral / Chronic Care Management: CRR required this visit?  No   CCM required this visit?  No     Plan:     I have personally reviewed and noted the  following in the patient's chart:   Medical and social history Use of alcohol, tobacco or illicit drugs  Current medications and supplements including opioid prescriptions. Patient is not currently taking opioid prescriptions. Functional ability and status Nutritional status Physical activity Advanced directives List of other physicians Hospitalizations, surgeries, and ER visits in previous 12 months Vitals Screenings to include cognitive, depression, and falls Referrals and appointments  In addition, I have reviewed and discussed with patient certain preventive protocols, quality metrics, and best practice recommendations. A written personalized care plan for preventive services as well as general preventive health recommendations were provided to patient.     Marzella Schlein, LPN   [id number]   After Visit Summary: (MyChart) Due to this being a telephonic visit, the after visit summary with patients personalized plan was offered to patient via MyChart   Nurse Notes: none

## 2023-08-09 NOTE — Patient Instructions (Signed)
Mr. Gregory Jensen , Thank you for taking time to come for your Medicare Wellness Visit. I appreciate your ongoing commitment to your health goals. Please review the following plan we discussed and let me know if I can assist you in the future.   Referrals/Orders/Follow-Ups/Clinician Recommendations: Aim for 30 minutes of exercise or brisk walking, 6-8 glasses of water, and 5 servings of fruits and vegetables each day.   This is a list of the screening recommended for you and due dates:  Health Maintenance  Topic Date Due   Complete foot exam   01/18/2020   Medicare Annual Wellness Visit  08/06/2023   Zoster (Shingles) Vaccine (1 of 2) 10/19/2023*   Flu Shot  11/14/2023*   Pneumonia Vaccine (1 of 2 - PCV) 01/19/2024*   DTaP/Tdap/Td vaccine (2 - Td or Tdap) 12/20/2023   Hemoglobin A1C  01/19/2024   Eye exam for diabetics  05/04/2024   Yearly kidney function blood test for diabetes  07/20/2024   Yearly kidney health urinalysis for diabetes  07/20/2024   Colon Cancer Screening  05/01/2026   Hepatitis C Screening  Completed   HPV Vaccine  Aged Out   Screening for Lung Cancer  Discontinued   COVID-19 Vaccine  Discontinued  *Topic was postponed. The date shown is not the original due date.    Advanced directives: (Declined) Advance directive discussed with you today. Even though you declined this today, please call our office should you change your mind, and we can give you the proper paperwork for you to fill out.  Next Medicare Annual Wellness Visit scheduled for next year: Yes

## 2023-08-11 ENCOUNTER — Ambulatory Visit: Payer: PPO

## 2023-08-11 DIAGNOSIS — E538 Deficiency of other specified B group vitamins: Secondary | ICD-10-CM | POA: Diagnosis not present

## 2023-08-11 MED ORDER — CYANOCOBALAMIN 1000 MCG/ML IJ SOLN
1000.0000 ug | Freq: Once | INTRAMUSCULAR | Status: AC
  Administered 2023-08-11: 1000 ug via INTRAMUSCULAR

## 2023-08-11 NOTE — Progress Notes (Signed)
Patient is in office today for a nurse visit for B12 Injection, per PCP's order. Patient Injection was given in the  Right deltoid. Patient tolerated injection well.

## 2023-08-15 NOTE — Progress Notes (Signed)
His brain MRI shows age-related changes however there is a small area of thickening in the lining around his brain.  This is not an urgent concern however this needs to be followed by neurology.  He can call to schedule appointment if needed or we can place referral if needed as well.  Please see separate reports for his other MRIs.

## 2023-08-15 NOTE — Progress Notes (Signed)
Is thoracic spine MRI shows a herniated disc along his mid spine.  This does seem to be pressing on the spinal cord.  I think this is probably what is causing a lot of his symptoms he has been experiencing.  Recommend urgent referral to neurosurgery to discuss management options.  He should let us know if he has any change in symptoms since our last visit.

## 2023-08-15 NOTE — Progress Notes (Signed)
The MRI of his neck showed several areas of degenerative changes with an acute flare of arthritis at his right neck.  He also does have several areas of pinched nerve throughout his neck.  This is probably causing some of his arm pain.  Recommend he follow-up with orthopedics soon.

## 2023-08-15 NOTE — Progress Notes (Signed)
MRI of his lower back shows degenerative changes that have progressed since his last MRI in 2020.  He can discuss this with neurosurgery or orthopedics however recommend we address his herniated disc per last note first.

## 2023-08-16 ENCOUNTER — Other Ambulatory Visit: Payer: Self-pay | Admitting: *Deleted

## 2023-08-16 DIAGNOSIS — M5020 Other cervical disc displacement, unspecified cervical region: Secondary | ICD-10-CM

## 2023-08-16 DIAGNOSIS — M5124 Other intervertebral disc displacement, thoracic region: Secondary | ICD-10-CM

## 2023-08-18 ENCOUNTER — Ambulatory Visit: Payer: PPO

## 2023-08-18 DIAGNOSIS — E538 Deficiency of other specified B group vitamins: Secondary | ICD-10-CM | POA: Diagnosis not present

## 2023-08-18 MED ORDER — CYANOCOBALAMIN 1000 MCG/ML IJ SOLN
1000.0000 ug | Freq: Once | INTRAMUSCULAR | Status: AC
  Administered 2023-08-18: 1000 ug via INTRAMUSCULAR

## 2023-08-18 NOTE — Progress Notes (Signed)
 Per orders of Dr. Jacquiline Doe, injection of B-12 given by Dorris Fetch in right deltoid. Patient tolerated injection well. Patient will make appointment for 1 month.

## 2023-08-26 ENCOUNTER — Other Ambulatory Visit (HOSPITAL_BASED_OUTPATIENT_CLINIC_OR_DEPARTMENT_OTHER): Payer: Self-pay | Admitting: Internal Medicine

## 2023-08-30 DIAGNOSIS — H35033 Hypertensive retinopathy, bilateral: Secondary | ICD-10-CM | POA: Diagnosis not present

## 2023-08-30 DIAGNOSIS — H2513 Age-related nuclear cataract, bilateral: Secondary | ICD-10-CM | POA: Diagnosis not present

## 2023-08-30 DIAGNOSIS — E113291 Type 2 diabetes mellitus with mild nonproliferative diabetic retinopathy without macular edema, right eye: Secondary | ICD-10-CM | POA: Diagnosis not present

## 2023-08-31 DIAGNOSIS — L821 Other seborrheic keratosis: Secondary | ICD-10-CM | POA: Diagnosis not present

## 2023-08-31 DIAGNOSIS — C44311 Basal cell carcinoma of skin of nose: Secondary | ICD-10-CM | POA: Diagnosis not present

## 2023-08-31 DIAGNOSIS — D044 Carcinoma in situ of skin of scalp and neck: Secondary | ICD-10-CM | POA: Diagnosis not present

## 2023-08-31 DIAGNOSIS — D485 Neoplasm of uncertain behavior of skin: Secondary | ICD-10-CM | POA: Diagnosis not present

## 2023-09-07 DIAGNOSIS — M21372 Foot drop, left foot: Secondary | ICD-10-CM | POA: Diagnosis not present

## 2023-09-07 DIAGNOSIS — Z6828 Body mass index (BMI) 28.0-28.9, adult: Secondary | ICD-10-CM | POA: Diagnosis not present

## 2023-09-07 DIAGNOSIS — M21371 Foot drop, right foot: Secondary | ICD-10-CM | POA: Diagnosis not present

## 2023-09-14 ENCOUNTER — Other Ambulatory Visit: Payer: Self-pay

## 2023-09-14 DIAGNOSIS — R202 Paresthesia of skin: Secondary | ICD-10-CM

## 2023-09-14 DIAGNOSIS — Z85828 Personal history of other malignant neoplasm of skin: Secondary | ICD-10-CM | POA: Diagnosis not present

## 2023-09-14 DIAGNOSIS — C44311 Basal cell carcinoma of skin of nose: Secondary | ICD-10-CM | POA: Diagnosis not present

## 2023-09-16 ENCOUNTER — Encounter: Payer: PPO | Admitting: Neurology

## 2023-09-21 DIAGNOSIS — D485 Neoplasm of uncertain behavior of skin: Secondary | ICD-10-CM | POA: Diagnosis not present

## 2023-09-21 DIAGNOSIS — Z85828 Personal history of other malignant neoplasm of skin: Secondary | ICD-10-CM | POA: Diagnosis not present

## 2023-09-21 DIAGNOSIS — L820 Inflamed seborrheic keratosis: Secondary | ICD-10-CM | POA: Diagnosis not present

## 2023-09-22 ENCOUNTER — Ambulatory Visit: Payer: PPO

## 2023-09-22 DIAGNOSIS — E538 Deficiency of other specified B group vitamins: Secondary | ICD-10-CM | POA: Diagnosis not present

## 2023-09-22 MED ORDER — CYANOCOBALAMIN 1000 MCG/ML IJ SOLN
1000.0000 ug | Freq: Once | INTRAMUSCULAR | Status: AC
Start: 2023-09-22 — End: 2023-09-22
  Administered 2023-09-22: 1000 ug via INTRAMUSCULAR

## 2023-09-22 NOTE — Progress Notes (Signed)
 Patient is in office today for a nurse visit for B12 Injection, per PCP's order. Patient Injection was given in the  Right deltoid. Patient tolerated injection well.

## 2023-09-23 ENCOUNTER — Ambulatory Visit: Payer: PPO | Admitting: Neurology

## 2023-09-23 ENCOUNTER — Encounter: Payer: Self-pay | Admitting: Neurology

## 2023-09-23 DIAGNOSIS — R202 Paresthesia of skin: Secondary | ICD-10-CM

## 2023-09-23 NOTE — Progress Notes (Signed)
 [facility] Clinic Note - Initial Visit   Date: 09/23/2023   Gregory Jensen MRN: [id number] DOB: March 17, 1956   Dear Dr. Carollee:  Thank you for your kind referral of Gregory Jensen for consultation of progressive neuropathy. Although his history is well known to you, please allow us  to reiterate it for the purpose of our medical record. The patient was accompanied to the clinic by self.    Gregory Jensen is a 68 y.o. right-handed male with diabetes mellitus, CAD, hyperlipidemia, history of prostate cancer, history of non-small cell lung cancer s/p lobectomy (2023), and tobacco use presenting for evaluation of neuropathy.   IMPRESSION/PLAN: Progressive sensorimotor axonal neuropathy manifesting with bilateral lower extremity numbness, bilateral foot drop, and muscle atrophy.  He was seen here in 2020 for the same symptoms at which time NCS/EMG confirmed the presence of an active on chronic sensorimotor polyneuropathy.  Neuropathy labs and CSF testing was normal.  Diabetes is well-controlled and likely to manifest with this degree of neuropathy. Today, he is here with ongoing symptoms of bilateral lower extremity numbness, weakness, and atrophy.  I suspect this is the natural progression of neuropathy.  He also mentions some numbness in the hands.   I will check NCS/EMG of the left arm and leg to look for interval change.  Although he has no family history of neuropathy, genetic testing to look for late hereditary type of neuropathy may be indicated going forward.   I recommend bilateral AFOs, he already has a prescription for this. Patient educated on daily foot inspection, fall prevention, and safety precautions around the home. Driving precautions discussed  Return to clinic after testing  ------------------------------------------------------------- History of present illness: Since around 2015, he began having numbness and weakness in the feet.   Symptoms have been slow but progressive.  He was previously seen by me in 2020 for the same symptoms at which time NCS/EMG confirmed the presence of an active on chronic sensorimotor polyneuropathy.  Neuropathy labs and CSF testing was normal.  He presents today with ongoing constant numbness in the feet and lower legs, which has become more noticeable.  He has noticed that he needs to focus on walking more, otherwise his feet can catch and he could trip. He has one fall in 2024 while he was walking in the grass, however, he tends to trip 2-3 times per month.  He has some numbness in the hands.  He denies weakness or dropping items.   He lives in a one level home.  He has steps to enter the home.  He smokes 1 ppd x 55 years.  He does not drink alcohol.  Diabetes is well controlled.  His vitamin B12 level is low and he has been getting injections for this.  No history of alcohol or chemotherapy exposure.  He had imaging of the neuroaxis which shows chronic microvascular changes in the brain and possible small meningioma.  MRI lumbar spine shows multilevel degenerative changes with progressive multifactorial stenosis at L3-4 (moderate).  MRI thoracic spine shows mild cord signal abnormality at T6-7 for which he is getting repeat study next week with contrast.   Out-side paper records, electronic medical record, and images have been reviewed where available and summarized as:  NCS/EMG of the legs 07/05/2019: The electrophysiologic findings are consistent with a severe, active on chronic, sensorimotor axonal axonal neuropathy affecting bilateral lower extremities. A superimposed L3-4 radiculopathy affecting the right lower extremity is also likely.  Correlate clinically.  MRI  brain wo contrast 08/15/2023: 1. No acute intracranial abnormality. 2. Multifocal hyperintense T2-weighted signal within the white matter, nonspecific but most commonly seen in the setting of chronic small vessel ischemia. 3. Small  focus of extra-axial signal irregularity along the left convexity, which may be a small meningioma or focus of dural thickening.  MRI lumbar spine wo contrast 08/15/2023: 1. Stable benign-appearing multiple lumbar compression fractures since 2020. No evidence of metastatic disease on this noncontrast exam.   2. Progressed since 2020 degenerative up to moderate multifactorial spinal, lateral recess, and foraminal stenosis at L3-L4. Chronic L4-L5 and L5-S1 degeneration and stenosis have not significantly changed, chronic grade 1 spondylolisthesis at the former.   MRI thoracic spine 08/15/2023: 1. Chronic T2, T3, and T4 superior endplate compression fractures are stable since September. Degenerative interbody ankylosis at those levels and also T8-T9. No osseous metastatic disease identified on this noncontrast exam. 2. Intermittent mild thoracic spinal stenosis in part related to epidural lipomatosis. Small disc herniation at T6-T7 with subsequent mild spinal stenosis AND spinal cord mass effect. Subtle spinal cord signal abnormality there, favor related to mild compressive myelomalacia. 3. Borderline to mild degenerative spinal stenosis at T9-T10, T10-T11, and T11-T12. But no other spinal cord mass effect, and no other convincing spinal cord signal abnormality. 4. Cervical lumbar MRI reported separately.  MRI cervical spine 08/15/2023:  1. Little cervical spine disc degeneration for age, but widespread cervical facet arthropathy. Acute exacerbation of chronic facet joint arthritis on the right at C3-C4, with mild marrow and soft tissue edema there. Query right side neck pain. 2. No cervical spinal stenosis, and normal cervical spinal cord. But moderate and occasionally severe neural foraminal stenosis at the bilateral C4, left C5, and bilateral C6 nerve levels.  Lab Results  Component Value Date   HGBA1C 6.3 (A) 07/21/2023   Lab Results  Component Value Date   VITAMINB12  174 (L) 07/21/2023   Lab Results  Component Value Date   TSH 2.95 07/21/2023   Lab Results  Component Value Date   ESRSEDRATE 17 10/25/2018    Past Medical History:  Diagnosis Date   Cancer (HCC) 4 years ago   Prostate Cancer , Lung Cancer   Diabetes mellitus without complication (HCC)    type 2   Hyperlipidemia    Hypertension    Neuromuscular disorder (HCC)    neuropathy   Neuropathy    in legs causing balance issues per patient   Sleep apnea    Smoker     Past Surgical History:  Procedure Laterality Date   BACK SURGERY  15 years ago   BRONCHIAL BIOPSY  05/10/2022   Procedure: BRONCHIAL BIOPSIES;  Surgeon: Shelah Lamar RAMAN, MD;  Location: [facility] ENDOSCOPY;  Service: Pulmonary;;   BRONCHIAL BRUSHINGS  05/10/2022   Procedure: BRONCHIAL BRUSHINGS;  Surgeon: Shelah Lamar RAMAN, MD;  Location: [facility] ENDOSCOPY;  Service: Pulmonary;;   BRONCHIAL NEEDLE ASPIRATION BIOPSY  05/10/2022   Procedure: BRONCHIAL NEEDLE ASPIRATION BIOPSIES;  Surgeon: Shelah Lamar RAMAN, MD;  Location: [facility] ENDOSCOPY;  Service: Pulmonary;;   COLONOSCOPY  2007   pt does not know MD name/normal exam per pt.   FIDUCIAL MARKER PLACEMENT  05/10/2022   Procedure: FIDUCIAL DYE MARKING;  Surgeon: Shelah Lamar RAMAN, MD;  Location: [facility] ENDOSCOPY;  Service: Pulmonary;;   INTERCOSTAL NERVE BLOCK Right 05/10/2022   Procedure: INTERCOSTAL NERVE BLOCK;  Surgeon: Shyrl Linnie KIDD, MD;  Location: [facility] OR;  Service: Thoracic;  Laterality: Right;   NODE DISSECTION Right 05/10/2022  Procedure: NODE DISSECTION;  Surgeon: Shyrl Linnie KIDD, MD;  Location: [facility] OR;  Service: Thoracic;  Laterality: Right;   PROSTATECTOMY  4 years ago     Medications:  Outpatient Encounter Medications as of 09/23/2023  Medication Sig Note   aspirin  81 MG EC tablet Take 81 mg by mouth in the morning.    atorvastatin  (LIPITOR) 40 MG tablet TAKE 1 TABLET BY MOUTH EVERY DAY    Blood Glucose Monitoring Suppl (TRUE METRIX METER) w/Device KIT USE AS DIRECTED     fenofibrate  (TRICOR ) 145 MG tablet TAKE 1 TABLET BY MOUTH EVERY DAY    glucose blood (ONETOUCH VERIO) test strip 1 each by Other route daily as needed for other. Use as instructed    ibuprofen (ADVIL) 200 MG tablet Take 400-600 mg by mouth daily as needed (pain.). 04/30/2022: seldom   losartan  (COZAAR ) 50 MG tablet TAKE 1 TABLET BY MOUTH EVERY DAY    meloxicam  (MOBIC ) 15 MG tablet TAKE 1 TABLET (15 MG TOTAL) BY MOUTH DAILY.    metFORMIN  (GLUCOPHAGE ) 500 MG tablet TAKE 1 TABLET BY MOUTH TWICE A DAY WITH FOOD    OneTouch Delica Lancets 33G MISC 1 each by Does not apply route daily as needed.    [DISCONTINUED] losartan  (COZAAR ) 25 MG tablet TAKE 1 TABLET EVERY DAY (Patient not taking: Reported on 09/23/2023)    No facility-administered encounter medications on file as of 09/23/2023.    Allergies:  Allergies  Allergen Reactions   Jardiance  [Empagliflozin ] Other (See Comments)    Candidal balanitis    Family History: Family History  Problem Relation Age of Onset   Alzheimer's disease Mother    Heart disease Father    Hypertension Sister    Heart disease Sister    Hypertension Sister    Stomach cancer Brother    Diabetes Other        family hx   Colon cancer Neg Hx    Colon polyps Neg Hx    Esophageal cancer Neg Hx    Rectal cancer Neg Hx     Social History: Social History   Tobacco Use   Smoking status: Every Day    Current packs/day: 1.00    Average packs/day: 1 pack/day for 47.0 years (47.0 ttl pk-yrs)    Types: Cigarettes   Smokeless tobacco: Never   Tobacco comments:    1 pack of cigarettes smoked daily [facility]J 07/27/22  Vaping Use   Vaping status: Never Used  Substance Use Topics   Alcohol use: Not Currently   Drug use: Never   Social History   Social History Narrative   Lives with wife. Has one son who lives in [place].    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        Are you right handed or left handed? Right Handed   Are you  currently employed ? No- Retired   What is your current occupation? No   Do you live at home alone? No   Who lives with you? Avelina- Significant other   What type of home do you live in: 1 story or 2 story? One story home        Vital Signs:  BP (!) 153/78   Pulse 79   Ht 5' 8 (1.727 m)   Wt 185 lb (83.9 kg)   SpO2 97%   BMI 28.13 kg/m    Neurological Exam: MENTAL STATUS including orientation to time, place, person, recent and  remote memory, attention span and concentration, language, and fund of knowledge is normal.  Speech is not dysarthric.  CRANIAL NERVES: II:  No visual field defects.     III-IV-VI: Pupils equal round and reactive to light.  Normal conjugate, extra-ocular eye movements in all directions of gaze.  No nystagmus.  No ptosis.   V:  Normal facial sensation.    VII:  Normal facial symmetry and movements.   VIII:  Normal hearing and vestibular function.   IX-X:  Normal palatal movement.   XI:  Normal shoulder shrug and head rotation.   XII:  Normal tongue strength and range of motion, no deviation or fasciculation.  MOTOR:  No atrophy, fasciculations or abnormal movements.  No pronator drift.   Upper Extremity:  Right  Left  Deltoid  5/5   5/5   Biceps  5/5   5/5   Triceps  5/5   5/5   Wrist extensors  5/5   5/5   Wrist flexors  5/5   5/5   Finger extensors  5/5   5/5   Finger flexors  5/5   5/5   Dorsal interossei  5/5   5/5   Abductor pollicis  5/5   5/5   Tone (Ashworth scale)  0  0   Lower Extremity:  Right  Left  Hip flexors  5/5   5/5   Hip extensors  5/5   5/5   Knee flexors  5/5   5/5   Knee extensors  5/5   5/5   Dorsiflexors  1/5   1/5   Plantarflexors  2/5   2/5   Eversion 1/5  1/5  Inversion 1/5  1/5  Toe extensors  1/5   1/5   Toe flexors  1/5   1/5   Tone (Ashworth scale)  0  0   MSRs:                                           Right        Left brachioradialis 2+  2+  biceps 2+  2+  triceps 2+  2+  patellar 1+  1+  ankle  jerk 0  0  plantar response mute  mute   SENSORY:  Vibration intact at the hands and knees, absent below the ankles.  Gradient pattern loss of temperature and pin prick from the knee down, worse on the left. Romberg's sign positive.   COORDINATION/GAIT: Normal finger-to- nose-finger. Bilateral steppage gait, assisted with cane, stable.    Thank you for allowing me to participate in patient's care.  If I can answer any additional questions, I would be pleased to do so.    Sincerely,    Serenna Deroy K. Tobie, DO

## 2023-09-23 NOTE — Patient Instructions (Addendum)
 Nerve testing of the left arm and leg  Check feet daily  Recommend you use ankle foot braces to prevent falls  Driving precautions discussed  ELECTROMYOGRAM AND NERVE CONDUCTION STUDIES (EMG/NCS) INSTRUCTIONS  How to Prepare The neurologist conducting the EMG will need to know if you have certain medical conditions. Tell the neurologist and other EMG lab personnel if you: Have a pacemaker or any other electrical medical device Take blood-thinning medications Have hemophilia, a blood-clotting disorder that causes prolonged bleeding Bathing Take a shower or bath shortly before your exam in order to remove oils from your skin. Don't apply lotions or creams before the exam.  What to Expect You'll likely be asked to change into a hospital gown for the procedure and lie down on an examination table. The following explanations can help you understand what will happen during the exam.  Electrodes. The neurologist or a technician places surface electrodes at various locations on your skin depending on where you're experiencing symptoms. Or the neurologist may insert needle electrodes at different sites depending on your symptoms.  Sensations. The electrodes will at times transmit a tiny electrical current that you may feel as a twinge or spasm. The needle electrode may cause discomfort or pain that usually ends shortly after the needle is removed. If you are concerned about discomfort or pain, you may want to talk to the neurologist about taking a short break during the exam.  Instructions. During the needle EMG, the neurologist will assess whether there is any spontaneous electrical activity when the muscle is at rest - activity that isn't present in healthy muscle tissue - and the degree of activity when you slightly contract the muscle.  He or she will give you instructions on resting and contracting a muscle at appropriate times. Depending on what muscles and nerves the neurologist is examining, he  or she may ask you to change positions during the exam.  After your EMG You may experience some temporary, minor bruising where the needle electrode was inserted into your muscle. This bruising should fade within several days. If it persists, contact your primary care doctor.

## 2023-09-28 DIAGNOSIS — M5124 Other intervertebral disc displacement, thoracic region: Secondary | ICD-10-CM | POA: Diagnosis not present

## 2023-09-28 DIAGNOSIS — M47814 Spondylosis without myelopathy or radiculopathy, thoracic region: Secondary | ICD-10-CM | POA: Diagnosis not present

## 2023-09-28 DIAGNOSIS — R200 Anesthesia of skin: Secondary | ICD-10-CM | POA: Diagnosis not present

## 2023-09-28 DIAGNOSIS — M4714 Other spondylosis with myelopathy, thoracic region: Secondary | ICD-10-CM | POA: Diagnosis not present

## 2023-10-01 ENCOUNTER — Other Ambulatory Visit (HOSPITAL_BASED_OUTPATIENT_CLINIC_OR_DEPARTMENT_OTHER): Payer: Self-pay | Admitting: Internal Medicine

## 2023-10-01 ENCOUNTER — Other Ambulatory Visit: Payer: Self-pay | Admitting: Family Medicine

## 2023-10-14 ENCOUNTER — Ambulatory Visit: Payer: PPO | Admitting: Neurology

## 2023-10-14 DIAGNOSIS — R202 Paresthesia of skin: Secondary | ICD-10-CM | POA: Diagnosis not present

## 2023-10-14 DIAGNOSIS — G629 Polyneuropathy, unspecified: Secondary | ICD-10-CM

## 2023-10-14 NOTE — Procedures (Signed)
 [facility]  [address] [place], [address]  [place], [place] [address] Tel: [phone] Fax: [phone] Test Date:  10/14/2023  Patient: Gregory Jensen DOB: [birth year] Physician: Nita Sickle, DO  Sex: Male Height: 5\' 8"  Ref Phys: Nita Sickle, DO  ID#: [id number]   Technician:    History: This is a 68 year old man with neuropathy referred for evaluation of progressive numbness in the arms and legs.  NCV & EMG Findings: Extensive electrodiagnostic testing of the left upper and lower extremity shows:   Left median, sural, and superficial peroneal sensory responses are absent.  Left ulnar and radial sensory responses show reduced amplitude (L3.5, L5.9 V).   Left median and peroneal (tibialis anterior) motor responses show prolonged latency, reduced amplitude, and slowed conduction velocity. Left ulnar motor response shows normal latency and amplitude, however conduction velocity is mildly slowed along the course of the nerve.  Left peroneal (EDB) and tibial motor responses are absent.   Left tibial H reflex study is absent. In the left upper extremity, chronic motor axonal loss changes are seen affecting the distal muscles, without accompanying active denervation. In the left lower extremity, active on chronic motor axon loss changes are seen affecting the muscles of the lower extremity, following a gradient pattern which is worse distally.   Impression: The electrophysiologic findings are consistent with a severe, active on chronic, sensorimotor polyneuropathy, with axonal and demyelinating features, affecting the left upper and lower extremities.     ___________________________ Nita Sickle, DO    Nerve Conduction Studies   Stim Site NR Peak (ms) Norm Peak (ms) O-P Amp (V) Norm O-P Amp  Left Median Anti Sensory (2nd Digit)  32 C  Wrist *NR  <3.8  >10  Left Radial Anti Sensory (Base 1st Digit)  32 C  Wrist    1.6 <2.8 *5.9 >10  Left Sup Peroneal Anti Sensory  (Ant Lat Mall)  32 C  12 cm *NR  <4.6  >3  Left Sural Anti Sensory (Lat Mall)  32 C  Calf *NR  <4.6  >3  Left Ulnar Anti Sensory (5th Digit)  32 C  Wrist    3.1 <3.2 *3.5 >5     Stim Site NR Onset (ms) Norm Onset (ms) O-P Amp (mV) Norm O-P Amp Site1 Site2 Delta-0 (ms) Dist (cm) Vel (m/s) Norm Vel (m/s)  Left Median Motor (Abd Poll Brev)  32 C  Wrist    *6.3 <4.0 *2.7 >5 Elbow Wrist 9.3 31.0 *33 >50  Elbow    15.6  1.4         Left Peroneal Motor (Ext Dig Brev)  32 C  Ankle *NR  <6.0  >2.5 B Fib Ankle  0.0  >40  B Fib *NR     Poplt B Fib  0.0  >40  Poplt *NR            Left Peroneal TA Motor (Tib Ant)  32 C  Fib Head    *5.2 <4.5 *0.9 >3 Poplit Fib Head 2.1 8.0 *38 >40  Poplit    *7.3 <5.7 0.8         Left Tibial Motor (Abd Hall Brev)  32 C  Ankle *NR  <6.0  >4 Knee Ankle  0.0  >40  Knee *NR            Left Ulnar Motor (Abd Dig Minimi)  32 C  Wrist    2.2 <3.1 7.2 >7 B Elbow Wrist 4.8 23.0 *48 >50  B  Elbow    7.0  6.6  A Elbow B Elbow 2.2 10.0 *45 >50  A Elbow    9.2  6.3          Electromyography   Side Muscle Ins.Act Fibs Fasc Recrt Amp Dur Poly Activation Comment  Left 1stDorInt Nml Nml Nml *1- *1+ *1+ *1+ Nml N/A  Left Abd Poll Brev Nml Nml Nml *2- *1+ *1+ *1+ Nml N/A  Left PronatorTeres Nml Nml Nml *1- *1+ *1+ *1+ Nml N/A  Left Biceps Nml Nml Nml Nml Nml Nml Nml Nml N/A  Left Triceps Nml Nml Nml Nml Nml Nml Nml Nml N/A  Left Deltoid Nml Nml Nml Nml Nml Nml Nml Nml N/A  Left AntTibialis Nml *1+ Nml *SMU *1+ *1+ *1+ Nml N/A  Left Gastroc Nml *1+ Nml *SMU *1+ *1+ *1+ Nml N/A  Left Flex Dig Long Nml Nml Nml *None *- *- *- Nml N/A  Left BicepsFemS Nml Nml Nml Nml Nml Nml Nml Nml N/A  Left RectFemoris Nml Nml Nml *2- *1+ *1+ *1+ Nml N/A  Left GluteusMed Nml Nml Nml Nml Nml Nml Nml Nml N/A  Left AdductorLong Nml Nml Nml Nml Nml Nml Nml Nml N/A  Left Ext Indicis Nml Nml Nml *1- *1+ *1+ *1+ Nml N/A      Waveforms:

## 2023-10-17 ENCOUNTER — Encounter (HOSPITAL_COMMUNITY): Payer: Self-pay

## 2023-10-17 ENCOUNTER — Ambulatory Visit (HOSPITAL_COMMUNITY)
Admission: RE | Admit: 2023-10-17 | Discharge: 2023-10-17 | Disposition: A | Discharge status: Home / Self Care | Payer: PPO | Source: Ambulatory Visit | Attending: Internal Medicine | Admitting: Internal Medicine

## 2023-10-17 ENCOUNTER — Inpatient Hospital Stay: Payer: PPO | Attending: Internal Medicine

## 2023-10-17 DIAGNOSIS — Z85118 Personal history of other malignant neoplasm of bronchus and lung: Secondary | ICD-10-CM | POA: Insufficient documentation

## 2023-10-17 DIAGNOSIS — I700 Atherosclerosis of aorta: Secondary | ICD-10-CM | POA: Diagnosis not present

## 2023-10-17 DIAGNOSIS — R911 Solitary pulmonary nodule: Secondary | ICD-10-CM | POA: Diagnosis not present

## 2023-10-17 DIAGNOSIS — C3490 Malignant neoplasm of unspecified part of unspecified bronchus or lung: Secondary | ICD-10-CM | POA: Insufficient documentation

## 2023-10-17 DIAGNOSIS — Z8546 Personal history of malignant neoplasm of prostate: Secondary | ICD-10-CM | POA: Diagnosis not present

## 2023-10-17 DIAGNOSIS — Z902 Acquired absence of lung [part of]: Secondary | ICD-10-CM | POA: Diagnosis not present

## 2023-10-17 DIAGNOSIS — C61 Malignant neoplasm of prostate: Secondary | ICD-10-CM | POA: Diagnosis not present

## 2023-10-17 LAB — CBC WITH DIFFERENTIAL (CANCER CENTER ONLY)
Abs Immature Granulocytes: 0.04 10*3/uL (ref 0.00–0.07)
Basophils Absolute: 0.1 10*3/uL (ref 0.0–0.1)
Basophils Relative: 1 %
Eosinophils Absolute: 0.1 10*3/uL (ref 0.0–0.5)
Eosinophils Relative: 1 %
HCT: 41.4 % (ref 39.0–52.0)
Hemoglobin: 14.2 g/dL (ref 13.0–17.0)
Immature Granulocytes: 0 %
Lymphocytes Relative: 25 %
Lymphs Abs: 2.3 10*3/uL (ref 0.7–4.0)
MCH: 31.6 pg (ref 26.0–34.0)
MCHC: 34.3 g/dL (ref 30.0–36.0)
MCV: 92.2 fL (ref 80.0–100.0)
Monocytes Absolute: 0.6 10*3/uL (ref 0.1–1.0)
Monocytes Relative: 6 %
Neutro Abs: 6.3 10*3/uL (ref 1.7–7.7)
Neutrophils Relative %: 67 %
Platelet Count: 202 10*3/uL (ref 150–400)
RBC: 4.49 MIL/uL (ref 4.22–5.81)
RDW: 12.6 % (ref 11.5–15.5)
WBC Count: 9.3 10*3/uL (ref 4.0–10.5)
nRBC: 0 % (ref 0.0–0.2)

## 2023-10-17 LAB — CMP (CANCER CENTER ONLY)
ALT: 21 U/L (ref 0–44)
AST: 20 U/L (ref 15–41)
Albumin: 4.5 g/dL (ref 3.5–5.0)
Alkaline Phosphatase: 59 U/L (ref 38–126)
Anion gap: 6 (ref 5–15)
BUN: 19 mg/dL (ref 8–23)
CO2: 25 mmol/L (ref 22–32)
Calcium: 9.9 mg/dL (ref 8.9–10.3)
Chloride: 106 mmol/L (ref 98–111)
Creatinine: 1.05 mg/dL (ref 0.61–1.24)
GFR, Estimated: >60 mL/min (ref >60)
Glucose, Bld: 207 mg/dL — ABNORMAL HIGH (ref 70–99)
Potassium: 3.8 mmol/L (ref 3.5–5.1)
Sodium: 137 mmol/L (ref 135–145)
Total Bilirubin: 0.4 mg/dL (ref 0.0–1.2)
Total Protein: 7.2 g/dL (ref 6.5–8.1)

## 2023-10-17 MED ORDER — IOHEXOL 300 MG/ML  SOLN
75.0000 mL | Freq: Once | INTRAMUSCULAR | Status: AC | PRN
  Administered 2023-10-17: 75 mL via INTRAVENOUS

## 2023-10-20 ENCOUNTER — Encounter: Payer: Self-pay | Admitting: Neurology

## 2023-10-20 ENCOUNTER — Ambulatory Visit: Payer: PPO

## 2023-10-20 ENCOUNTER — Encounter: Payer: PPO | Admitting: Neurology

## 2023-10-20 ENCOUNTER — Telehealth: Payer: Self-pay | Admitting: Neurology

## 2023-10-20 DIAGNOSIS — E538 Deficiency of other specified B group vitamins: Secondary | ICD-10-CM | POA: Diagnosis not present

## 2023-10-20 MED ORDER — CYANOCOBALAMIN 1000 MCG/ML IJ SOLN
1000.0000 ug | Freq: Once | INTRAMUSCULAR | Status: AC
Start: 2023-10-20 — End: 2023-10-20
  Administered 2023-10-20: 1000 ug via INTRAMUSCULAR

## 2023-10-20 NOTE — Telephone Encounter (Signed)
 Pt left a Voicemail stating that he was returning a call to [facility]

## 2023-10-20 NOTE — Progress Notes (Signed)
 Patient is in office today for a nurse visit for B12 Injection, per PCP's order. Patient Injection was given in the  Right deltoid. Patient tolerated injection well.

## 2023-10-20 NOTE — Telephone Encounter (Signed)
 I called patient back Gregory Jensen, actually had already spoken to him. I will close encounter.

## 2023-10-24 ENCOUNTER — Telehealth: Payer: Self-pay | Admitting: Internal Medicine

## 2023-10-24 ENCOUNTER — Encounter: Payer: Self-pay | Admitting: Neurology

## 2023-10-24 ENCOUNTER — Inpatient Hospital Stay: Payer: PPO | Admitting: Internal Medicine

## 2023-10-24 ENCOUNTER — Ambulatory Visit: Admitting: Neurology

## 2023-10-24 VITALS — BP 138/71 | HR 72 | Temp 98.2°F | Resp 16 | Ht 68.0 in | Wt 185.4 lb

## 2023-10-24 VITALS — BP 146/65 | HR 82 | Ht 68.0 in | Wt 188.0 lb

## 2023-10-24 DIAGNOSIS — C3490 Malignant neoplasm of unspecified part of unspecified bronchus or lung: Secondary | ICD-10-CM

## 2023-10-24 DIAGNOSIS — R911 Solitary pulmonary nodule: Secondary | ICD-10-CM | POA: Diagnosis not present

## 2023-10-24 DIAGNOSIS — M21372 Foot drop, left foot: Secondary | ICD-10-CM

## 2023-10-24 DIAGNOSIS — M21371 Foot drop, right foot: Secondary | ICD-10-CM | POA: Diagnosis not present

## 2023-10-24 DIAGNOSIS — G629 Polyneuropathy, unspecified: Secondary | ICD-10-CM

## 2023-10-24 NOTE — Progress Notes (Unsigned)
 Follow-up Visit   Date: 10/24/2023    Gregory Jensen MRN: [id number] DOB: Dec 21, 1955    Gregory Jensen is a 68 y.o. right-handed Caucasian male with diabetes mellitus, CAD, hyperlipidemia, history of prostate cancer, history of non-small cell lung cancer s/p lobectomy (2023), and tobacco use returning to the clinic for follow-up of neuropathy.  The patient was accompanied to the clinic by self.   IMPRESSION/PLAN: Chronic sensorimotor axonal neuropathy with bilateral foot drop, distal atrophy, and paresthesias involving the hands and feet.  Results of his recent NCS/EMG of the left side shows progressive predominately axonal neuropathy extending in to the hands.  Prior testing in 2020 including extensive laboratory testing and CSF testing which was normal, specifically no evidence of CIDP.  Although he is diabetic, it is very well-controlled and would not expect with this degree of symptoms.  He does not have family history of neuropathy, but to be complete, we discussed additional testing to look for late onset hereditary neuropathy.  He decided not to pursue any further testing.  He was encouraged to get AFO to help with balance and stability.  He has completed PT in the past and did not find that it helped.  All questions were answered.  Patient educated on daily foot inspection, fall prevention, and safety precautions around the home.  Return to clinic as needed  --------------------------------------------- History of present illness: Since around 2015, he began having numbness and weakness in the feet.  Symptoms have been slow but progressive.  He was previously seen by me in 2020 for the same symptoms at which time NCS/EMG confirmed the presence of an active on chronic sensorimotor polyneuropathy.  Neuropathy labs and CSF testing was normal.  He presents today with ongoing constant numbness in the feet and lower legs, which has become more noticeable.  He has noticed that he  needs to focus on walking more, otherwise his feet can catch and he could trip. He has one fall in 2024 while he was walking in the grass, however, he tends to trip 2-3 times per month.  He has some numbness in the hands.  He denies weakness or dropping items.    He lives in a one level home.  He has steps to enter the home.  He smokes 1 ppd x 55 years.  He does not drink alcohol.  Diabetes is well controlled.  His vitamin B12 level is low and he has been getting injections for this.  No history of alcohol or chemotherapy exposure.  He had imaging of the neuroaxis which shows chronic microvascular changes in the brain and possible small meningioma.  MRI lumbar spine shows multilevel degenerative changes with progressive multifactorial stenosis at L3-4 (moderate).  MRI thoracic spine shows mild cord signal abnormality at T6-7 for which he is getting repeat study next week with contrast.   UPDATE 10/24/2023:  She is here for follow-up visit to discuss results of EMG.  Neuropathy remains unchanged with numbness and weakness in the legs as well as numbness in the hands.   Medications:  Current Outpatient Medications on File Prior to Visit  Medication Sig Dispense Refill   aspirin 81 MG EC tablet Take 81 mg by mouth in the morning.     atorvastatin (LIPITOR) 40 MG tablet TAKE 1 TABLET BY MOUTH EVERY DAY 90 tablet 0   Blood Glucose Monitoring Suppl (TRUE METRIX METER) w/Device KIT USE AS DIRECTED 1 kit 0   fenofibrate (TRICOR) 145 MG tablet TAKE 1  TABLET BY MOUTH EVERY DAY 90 tablet 3   glucose blood (ONETOUCH VERIO) test strip 1 each by Other route daily as needed for other. Use as instructed 100 each 4   ibuprofen (ADVIL) 200 MG tablet Take 400-600 mg by mouth daily as needed (pain.).     losartan (COZAAR) 50 MG tablet TAKE 1 TABLET BY MOUTH EVERY DAY 90 tablet 1   meloxicam (MOBIC) 15 MG tablet TAKE 1 TABLET (15 MG TOTAL) BY MOUTH DAILY. 30 tablet 0   metFORMIN (GLUCOPHAGE) 500 MG tablet TAKE 1 TABLET  BY MOUTH TWICE A DAY WITH FOOD 180 tablet 1   OneTouch Delica Lancets 33G MISC 1 each by Does not apply route daily as needed. 100 each 4   No current facility-administered medications on file prior to visit.    Allergies:  Allergies  Allergen Reactions   Jardiance [Empagliflozin] Other (See Comments)    Candidal balanitis    Vital Signs:  BP (!) 146/65   Pulse 82   Ht 5\' 8"  (1.727 m)   Wt 188 lb (85.3 kg)   SpO2 96%   BMI 28.59 kg/m   Neurological Exam: MENTAL STATUS including orientation to time, place, person, recent and remote memory, attention span and concentration, language, and fund of knowledge is normal.  Speech is not dysarthric.  CRANIAL NERVES:  No visual field defects.  Pupils equal round and reactive to light.  Normal conjugate, extra-ocular eye movements in all directions of gaze.  No ptosis .  Face is symmetric. Palate elevates symmetrically.  Tongue is midline.  MOTOR:  Bilateral lower leg muscle atrophy.  No fasciculations or abnormal movements.  No pronator drift.    Upper Extremity:  Right   Left  Deltoid  5/5    5/5   Biceps  5/5    5/5   Triceps  5/5    5/5   Wrist extensors  5/5    5/5   Wrist flexors  5/5    5/5   Finger extensors  5/5    5/5   Finger flexors  5/5    5/5   Dorsal interossei  5/5    5/5   Abductor pollicis  5/5    5/5   Tone (Ashworth scale)  0   0    Lower Extremity:  Right   Left  Hip flexors  5/5    5/5   Hip extensors  5/5    5/5   Knee flexors  5/5    5/5   Knee extensors  5/5    5/5   Dorsiflexors  1/5    1/5   Plantarflexors  2/5    2/5   Eversion 1/5   1/5  Inversion 1/5   1/5  Toe extensors  1/5    1/5   Toe flexors  1/5    1/5   Tone (Ashworth scale)  0   0   MSRs:                                           Right        Left brachioradialis 2+  2+  biceps 2+  2+  triceps 2+  2+  patellar 1+  1+  ankle jerk 0  0   SENSORY:  Vibration intact at the knees, absent below the ankles.  Positive Rhomberg sign.  COORDINATION/GAIT:  Normal finger-to- nose-finger. Bilateral steppage gait, assisted with cane.   Data: NCS/EMG of the left side 10/14/2023: The electrophysiologic findings are consistent with a severe, active on chronic, sensorimotor polyneuropathy, with axonal and demyelinating features, affecting the left upper and lower extremities.      Thank you for allowing me to participate in patient's care.  If I can answer any additional questions, I would be pleased to do so.    Sincerely,    Eduard Penkala K. Allena Katz, DO

## 2023-10-24 NOTE — Progress Notes (Signed)
 [facility] [phone]   [phone]  OFFICE PROGRESS NOTE  Ardith Dark, MD [address] [place] [address]  DIAGNOSIS:  Stage IA (T1c, N0, M0) non-small cell lung cancer, squamous cell carcinoma presented with right upper lobe in July 2023   PRIOR THERAPY: Status post right upper lobectomy with lymph node sampling under the care of Dr. Cliffton Asters on 05/10/2022 with tumor size of 2.3 cm.   CURRENT THERAPY: Observation.  INTERVAL HISTORY: Gregory Jensen 68 y.o. male returns to the clinic today for follow-up visit.Discussed the use of AI scribe software for clinical note transcription with the patient, who gave verbal consent to proceed.  History of Present Illness   He is a 68 year old with stage 1A non-small cell lung cancer who presents for follow-up of a new pulmonary nodule.  A new pulmonary nodule has been identified in the left lower lobe, measuring 1.8 cm, with an additional smaller nodule nearby measuring 5 mm. This is distinct from the previous cancer located in the right upper lobe. The nodules were discovered during routine surveillance scans.  He was diagnosed with stage 1A non-small cell lung cancer, squamous cell carcinoma, in July 2023 and underwent a right upper lobectomy for a 2.3 cm tumor. Since the surgery, he has been under surveillance with annual scans, with no recurrence noted in the right upper lobe.  He has a persistent dry cough, which he describes as having 'pretty much had that forever.' No recent infections such as COVID or flu, and no hemoptysis. The cough is noted to be on the left side, not on the same side as his previous cancer.         MEDICAL HISTORY: Past Medical History:  Diagnosis Date   Cancer (HCC) 4 years ago   Prostate Cancer , Lung Cancer   Diabetes mellitus without complication (HCC)    type 2   Hyperlipidemia    Hypertension    Neuromuscular disorder (HCC)    neuropathy    Neuropathy    in legs causing balance issues per patient   Sleep apnea    Smoker     ALLERGIES:  is allergic to jardiance [empagliflozin].  MEDICATIONS:  Current Outpatient Medications  Medication Sig Dispense Refill   aspirin 81 MG EC tablet Take 81 mg by mouth in the morning.     atorvastatin (LIPITOR) 40 MG tablet TAKE 1 TABLET BY MOUTH EVERY DAY 90 tablet 0   Blood Glucose Monitoring Suppl (TRUE METRIX METER) w/Device KIT USE AS DIRECTED 1 kit 0   fenofibrate (TRICOR) 145 MG tablet TAKE 1 TABLET BY MOUTH EVERY DAY 90 tablet 3   glucose blood (ONETOUCH VERIO) test strip 1 each by Other route daily as needed for other. Use as instructed 100 each 4   ibuprofen (ADVIL) 200 MG tablet Take 400-600 mg by mouth daily as needed (pain.).     losartan (COZAAR) 50 MG tablet TAKE 1 TABLET BY MOUTH EVERY DAY 90 tablet 1   meloxicam (MOBIC) 15 MG tablet TAKE 1 TABLET (15 MG TOTAL) BY MOUTH DAILY. 30 tablet 0   metFORMIN (GLUCOPHAGE) 500 MG tablet TAKE 1 TABLET BY MOUTH TWICE A DAY WITH FOOD 180 tablet 1   OneTouch Delica Lancets 33G MISC 1 each by Does not apply route daily as needed. 100 each 4   No current facility-administered medications for this visit.    SURGICAL HISTORY:  Past Surgical History:  Procedure Laterality  Date   BACK SURGERY  15 years ago   BRONCHIAL BIOPSY  05/10/2022   Procedure: BRONCHIAL BIOPSIES;  Surgeon: Leslye Peer, MD;  Location: [facility] ENDOSCOPY;  Service: Pulmonary;;   BRONCHIAL BRUSHINGS  05/10/2022   Procedure: BRONCHIAL BRUSHINGS;  Surgeon: Leslye Peer, MD;  Location: [facility] ENDOSCOPY;  Service: Pulmonary;;   BRONCHIAL NEEDLE ASPIRATION BIOPSY  05/10/2022   Procedure: BRONCHIAL NEEDLE ASPIRATION BIOPSIES;  Surgeon: Leslye Peer, MD;  Location: [facility] ENDOSCOPY;  Service: Pulmonary;;   COLONOSCOPY  2007   pt does not know MD name/normal exam per pt.   FIDUCIAL MARKER PLACEMENT  05/10/2022   Procedure: FIDUCIAL DYE MARKING;  Surgeon: Leslye Peer, MD;   Location[facility] ENDOSCOPY;  Service: Pulmonary;;   INTERCOSTAL NERVE BLOCK Right 05/10/2022   Procedure: INTERCOSTAL NERVE BLOCK;  Surgeon: Corliss Skains, MD;  Location: [facility] OR;  Service: Thoracic;  Laterality: Right;   NODE DISSECTION Right 05/10/2022   Procedure: NODE DISSECTION;  Surgeon: Corliss Skains, MD;  Location: [facility] OR;  Service: Thoracic;  Laterality: Right;   PROSTATECTOMY  4 years ago    REVIEW OF SYSTEMS:  A comprehensive review of systems was negative except for: Respiratory: positive for cough Musculoskeletal: positive for arthralgias   PHYSICAL EXAMINATION: General appearance: alert, cooperative, and no distress Head: Normocephalic, without obvious abnormality, atraumatic Neck: no adenopathy, no JVD, supple, symmetrical, trachea midline, and thyroid not enlarged, symmetric, no tenderness/mass/nodules Lymph nodes: Cervical, supraclavicular, and axillary nodes normal. Resp: clear to auscultation bilaterally Back: symmetric, no curvature. ROM normal. No CVA tenderness. Cardio: regular rate and rhythm, S1, S2 normal, no murmur, click, rub or gallop GI: soft, non-tender; bowel sounds normal; no masses,  no organomegaly Extremities: extremities normal, atraumatic, no cyanosis or edema  ECOG PERFORMANCE STATUS: 1 - Symptomatic but completely ambulatory  Blood pressure 138/71, pulse 72, temperature 98.2 F (36.8 C), temperature source Temporal, resp. rate 16, height 5\' 8"  (1.727 m), weight 185 lb 6.4 oz (84.1 kg), SpO2 98%.  LABORATORY DATA: Lab Results  Component Value Date   WBC 9.3 10/17/2023   HGB 14.2 10/17/2023   HCT 41.4 10/17/2023   MCV 92.2 10/17/2023   PLT 202 10/17/2023      Chemistry      Component Value Date/Time   NA 137 10/17/2023 1232   NA 138 04/27/2022 0847   K 3.8 10/17/2023 1232   CL 106 10/17/2023 1232   CO2 25 10/17/2023 1232   BUN 19 10/17/2023 1232   BUN 19 04/27/2022 0847   CREATININE 1.05 10/17/2023 1232   CREATININE 0.97  07/23/2020 0827      Component Value Date/Time   CALCIUM 9.9 10/17/2023 1232   ALKPHOS 59 10/17/2023 1232   AST 20 10/17/2023 1232   ALT 21 10/17/2023 1232   BILITOT 0.4 10/17/2023 1232       RADIOGRAPHIC STUDIES: CT Chest W Contrast Result Date: 10/21/2023 CLINICAL DATA:  Non-small cell lung cancer. Prostate cancer. Chronic cough. * Tracking Code: BO * EXAM: CT CHEST WITH CONTRAST TECHNIQUE: Multidetector CT imaging of the chest was performed during intravenous contrast administration. RADIATION DOSE REDUCTION: This exam was performed according to the departmental dose-optimization program which includes automated exposure control, adjustment of the mA and/or kV according to patient size and/or use of iterative reconstruction technique. CONTRAST:  75mL OMNIPAQUE IOHEXOL 300 MG/ML  SOLN COMPARISON:  04/21/2023. FINDINGS: Cardiovascular: Atherosclerotic calcification of the aorta and coronary arteries. Heart is at the upper limits of normal in size.  No pericardial effusion. Mediastinum/Nodes: Thoracic inlet lymph nodes are not enlarged by CT size criteria. 1.4 cm low-attenuation right thyroid nodule. No follow-up recommended. (Ref: J Am Coll Radiol. 2015 Feb;12(2): 143-50).Mediastinal lymph nodes are not enlarged by CT size criteria. No hilar or axillary adenopathy. Esophagus is grossly unremarkable. Lungs/Pleura: Centrilobular and paraseptal emphysema. Right upper lobectomy. Anterolateral left lower lobe nodule measures 0.9 x 1.8 cm (7/102). 5 mm medial left lower lobe nodule (7/125), unchanged. No pleural fluid. Airway is unremarkable. Upper Abdomen: Slight thickening of the body of the right adrenal gland and medial limb left adrenal gland, unchanged. No specific follow-up necessary other than on routine imaging. Visualized portions of the liver, gallbladder, adrenal glands, kidneys, spleen, pancreas, stomach and bowel are otherwise grossly unremarkable. No upper abdominal adenopathy. Musculoskeletal:  Degenerative changes in the spine. Old L1 compression fracture. No worrisome lytic or sclerotic lesions. IMPRESSION: 1. 0.9 x 1.8 cm left lower lobe nodule, highly worrisome for disease recurrence. 2. Aortic atherosclerosis (ICD10-I70.0). Coronary artery calcification. 3.  Emphysema (ICD10-J43.9). Electronically Signed   By: Leanna Battles M.D.   On: 10/21/2023 12:45   NCV with EMG(electromyography) Result Date: 10/14/2023 Glendale Chard, DO     10/14/2023  4:06 PM [facility] [address] [place], [address]  [place], [place] [address] Tel: [phone] Fax: [phone] Test Date:  10/14/2023 Patient: Gregory Jensen DOB: 1956/02/16 Physician: Nita Sickle, DO Sex: Male Height: 5\' 8"  Ref Phys: Nita Sickle, DO ID#: [id number]   Technician:  History: This is a 68 year old man with neuropathy referred for evaluation of progressive numbness in the arms and legs. NCV & EMG Findings: Extensive electrodiagnostic testing of the left upper and lower extremity shows:  Left median, sural, and superficial peroneal sensory responses are absent.  Left ulnar and radial sensory responses show reduced amplitude (L3.5, L5.9 V).  Left median and peroneal (tibialis anterior) motor responses show prolonged latency, reduced amplitude, and slowed conduction velocity. Left ulnar motor response shows normal latency and amplitude, however conduction velocity is mildly slowed along the course of the nerve.  Left peroneal (EDB) and tibial motor responses are absent.  Left tibial H reflex study is absent. In the left upper extremity, chronic motor axonal loss changes are seen affecting the distal muscles, without accompanying active denervation. In the left lower extremity, active on chronic motor axon loss changes are seen affecting the muscles of the lower extremity, following a gradient pattern which is worse distally. Impression: The electrophysiologic findings are consistent with a severe, active on chronic, sensorimotor  polyneuropathy, with axonal and demyelinating features, affecting the left upper and lower extremities.  ___________________________ Nita Sickle, DO Nerve Conduction Studies  Stim Site NR Peak (ms) Norm Peak (ms) O-P Amp (V) Norm O-P Amp Left Median Anti Sensory (2nd Digit)  32 C Wrist *NR  <3.8  >10 Left Radial Anti Sensory (Base 1st Digit)  32 C Wrist    1.6 <2.8 *5.9 >10 Left Sup Peroneal Anti Sensory (Ant Lat Mall)  32 C 12 cm *NR  <4.6  >3 Left Sural Anti Sensory (Lat Mall)  32 C Calf *NR  <4.6  >3 Left Ulnar Anti Sensory (5th Digit)  32 C Wrist    3.1 <3.2 *3.5 >5  Stim Site NR Onset (ms) Norm Onset (ms) O-P Amp (mV) Norm O-P Amp Site1 Site2 Delta-0 (ms) Dist (cm) Vel (m/s) Norm Vel (m/s) Left Median Motor (Abd Poll Brev)  32 C Wrist    *6.3 <4.0 *2.7 >5 Elbow Wrist  9.3 31.0 *33 >50 Elbow    15.6  1.4        Left Peroneal Motor (Ext Dig Brev)  32 C Ankle *NR  <6.0  >2.5 B Fib Ankle  0.0  >40 B Fib *NR     Poplt B Fib  0.0  >40 Poplt *NR           Left Peroneal TA Motor (Tib Ant)  32 C Fib Head    *5.2 <4.5 *0.9 >3 Poplit Fib Head 2.1 8.0 *38 >40 Poplit    *7.3 <5.7 0.8        Left Tibial Motor (Abd Hall Brev)  32 C Ankle *NR  <6.0  >4 Knee Ankle  0.0  >40 Knee *NR           Left Ulnar Motor (Abd Dig Minimi)  32 C Wrist    2.2 <3.1 7.2 >7 B Elbow Wrist 4.8 23.0 *48 >50 B Elbow    7.0  6.6  A Elbow B Elbow 2.2 10.0 *45 >50 A Elbow    9.2  6.3        Electromyography  Side Muscle Ins.Act Fibs Fasc Recrt Amp Dur Poly Activation Comment Left 1stDorInt Nml Nml Nml *1- *1+ *1+ *1+ Nml N/A Left Abd Poll Brev Nml Nml Nml *2- *1+ *1+ *1+ Nml N/A Left PronatorTeres Nml Nml Nml *1- *1+ *1+ *1+ Nml N/A Left Biceps Nml Nml Nml Nml Nml Nml Nml Nml N/A Left Triceps Nml Nml Nml Nml Nml Nml Nml Nml N/A Left Deltoid Nml Nml Nml Nml Nml Nml Nml Nml N/A Left AntTibialis Nml *1+ Nml *SMU *1+ *1+ *1+ Nml N/A Left Gastroc Nml *1+ Nml *SMU *1+ *1+ *1+ Nml N/A Left Flex Dig Long Nml Nml Nml *None *- *- *- Nml N/A Left  BicepsFemS Nml Nml Nml Nml Nml Nml Nml Nml N/A Left RectFemoris Nml Nml Nml *2- *1+ *1+ *1+ Nml N/A Left GluteusMed Nml Nml Nml Nml Nml Nml Nml Nml N/A Left AdductorLong Nml Nml Nml Nml Nml Nml Nml Nml N/A Left Ext Indicis Nml Nml Nml *1- *1+ *1+ *1+ Nml N/A Waveforms:                      ASSESSMENT AND PLAN: This is a very pleasant 68 years old white male with stage IA (T1c, N0, M0) non-small cell lung cancer, squamous cell carcinoma presented with right upper lobe in July 2023 status post right upper lobectomy with lymph node sampling under the care of Dr. Cliffton Asters on 05/10/2022 with tumor size of 2.3 cm.  The patient is currently on observation and he is feeling fine except for dry cough and arthralgia. He had repeat CT scan of the chest performed recently.  I personally independently reviewed the scan images and discussed the result with the patient today.    Pulmonary nodule in the left lower lobe A 1.8 cm pulmonary nodule in the left lower lobe has been identified, distinct from the previous cancer in the right upper lobe. Differential diagnosis includes benign versus malignant etiology. Further evaluation with a PET scan is necessary to assess metabolic activity and guide management. - Order PET scan to evaluate the metabolic activity of the nodule - Schedule follow-up appointment in 2-3 weeks to discuss PET scan results and determine next steps  Stage 1A non-small cell lung cancer (squamous cell carcinoma) Diagnosed with stage 1A non-small cell lung cancer, squamous cell carcinoma, in July 2023. Underwent right upper  lobectomy for a 2.3 cm tumor. Currently under surveillance with annual scans. No recurrence in the right upper lobe. - Continue annual surveillance scans   The patient was advised to call immediately if he has any concerning symptoms in the interval. The patient voices understanding of current disease status and treatment options and is in agreement with the current care  plan.  All questions were answered. The patient knows to call the clinic with any problems, questions or concerns. We can certainly see the patient much sooner if necessary.  The total time spent in the appointment was 30 minutes.  Disclaimer: This note was dictated with voice recognition software. Similar sounding words can inadvertently be transcribed and may not be corrected upon review.       Lab and scan before next MD visit with Dr. Arbutus Ped in 3 months.

## 2023-10-24 NOTE — Telephone Encounter (Signed)
 Scheduled appointment per 3/10 LOS notes. The patient is aware of the appointment details.

## 2023-11-03 ENCOUNTER — Ambulatory Visit (HOSPITAL_COMMUNITY)
Admission: RE | Admit: 2023-11-03 | Discharge: 2023-11-03 | Disposition: A | Discharge status: Home / Self Care | Source: Ambulatory Visit | Attending: Internal Medicine | Admitting: Internal Medicine

## 2023-11-03 DIAGNOSIS — R911 Solitary pulmonary nodule: Secondary | ICD-10-CM | POA: Diagnosis not present

## 2023-11-03 DIAGNOSIS — C3490 Malignant neoplasm of unspecified part of unspecified bronchus or lung: Secondary | ICD-10-CM | POA: Diagnosis not present

## 2023-11-03 LAB — GLUCOSE, CAPILLARY: Glucose-Capillary: 146 mg/dL — ABNORMAL HIGH (ref 70–99)

## 2023-11-03 MED ORDER — FLUDEOXYGLUCOSE F - 18 (FDG) INJECTION
9.2500 | Freq: Once | INTRAVENOUS | Status: AC
  Administered 2023-11-03: 9.25 via INTRAVENOUS

## 2023-11-15 ENCOUNTER — Inpatient Hospital Stay: Attending: Internal Medicine | Admitting: Internal Medicine

## 2023-11-15 VITALS — BP 163/70 | HR 75 | Temp 98.2°F | Resp 16 | Ht 68.0 in | Wt 183.3 lb

## 2023-11-15 DIAGNOSIS — Z85118 Personal history of other malignant neoplasm of bronchus and lung: Secondary | ICD-10-CM | POA: Diagnosis not present

## 2023-11-15 DIAGNOSIS — C3432 Malignant neoplasm of lower lobe, left bronchus or lung: Secondary | ICD-10-CM

## 2023-11-15 DIAGNOSIS — Z902 Acquired absence of lung [part of]: Secondary | ICD-10-CM | POA: Insufficient documentation

## 2023-11-15 DIAGNOSIS — R911 Solitary pulmonary nodule: Secondary | ICD-10-CM | POA: Diagnosis not present

## 2023-11-15 NOTE — Progress Notes (Signed)
 [facility] [phone]   [phone]) [phone]  OFFICE PROGRESS NOTE  Ardith Dark, MD [address] [place] [address]  DIAGNOSIS:   1) new hypermetabolic left lower lobe pulmonary nodule suspicious for synchronous lung cancer. 2) Stage IA (T1c, N0, M0) non-small cell lung cancer, squamous cell carcinoma presented with right upper lobe in July 2023   PRIOR THERAPY: Status post right upper lobectomy with lymph node sampling under the care of Dr. Cliffton Asters on 05/10/2022 with tumor size of 2.3 cm.   CURRENT THERAPY: Observation.  INTERVAL HISTORY: Gregory Jensen 68 y.o. male returns to the clinic today for follow-up visit.  Discussed the use of AI scribe software for clinical note transcription with the patient, who gave verbal consent to proceed.  History of Present Illness   Gregory Jensen is a 68 year old male with stage 1A non-small cell lung cancer who presents for evaluation and discussion of PET scan results.  A recent CT scan identified a suspicious pulmonary nodule in the left lung. A subsequent PET scan revealed that this nodule is hypermetabolic, indicating activity. The nodule is located in the left lower lobe and measures approximately 2.1 by 0.9 centimeters. There is no evidence of spread to the lymph nodes or other areas outside the chest, and no recurrence of the previous cancer in the right lung.  He has a history of stage 1A non-small cell lung cancer, specifically squamous cell carcinoma, diagnosed in July 2023. He underwent a right upper lobectomy and has been under observation since then.  No new symptoms or complaints since his last visit.       MEDICAL HISTORY: Past Medical History:  Diagnosis Date   Cancer (HCC) 4 years ago   Prostate Cancer , Lung Cancer   Diabetes mellitus without complication (HCC)    type 2   Hyperlipidemia    Hypertension    Neuromuscular disorder (HCC)    neuropathy   Neuropathy    in  legs causing balance issues per patient   Sleep apnea    Smoker     ALLERGIES:  is allergic to jardiance [empagliflozin].  MEDICATIONS:  Current Outpatient Medications  Medication Sig Dispense Refill   aspirin 81 MG EC tablet Take 81 mg by mouth in the morning.     atorvastatin (LIPITOR) 40 MG tablet TAKE 1 TABLET BY MOUTH EVERY DAY 90 tablet 0   Blood Glucose Monitoring Suppl (TRUE METRIX METER) w/Device KIT USE AS DIRECTED 1 kit 0   fenofibrate (TRICOR) 145 MG tablet TAKE 1 TABLET BY MOUTH EVERY DAY 90 tablet 3   glucose blood (ONETOUCH VERIO) test strip 1 each by Other route daily as needed for other. Use as instructed 100 each 4   ibuprofen (ADVIL) 200 MG tablet Take 400-600 mg by mouth daily as needed (pain.).     losartan (COZAAR) 50 MG tablet TAKE 1 TABLET BY MOUTH EVERY DAY 90 tablet 1   meloxicam (MOBIC) 15 MG tablet TAKE 1 TABLET (15 MG TOTAL) BY MOUTH DAILY. 30 tablet 0   metFORMIN (GLUCOPHAGE) 500 MG tablet TAKE 1 TABLET BY MOUTH TWICE A DAY WITH FOOD 180 tablet 1   OneTouch Delica Lancets 33G MISC 1 each by Does not apply route daily as needed. 100 each 4   No current facility-administered medications for this visit.    SURGICAL HISTORY:  Past Surgical History:  Procedure Laterality Date   BACK SURGERY  15 years ago  BRONCHIAL BIOPSY  05/10/2022   Procedure: BRONCHIAL BIOPSIES;  Surgeon: Leslye Peer, MD;  Location: [facility] ENDOSCOPY;  Service: Pulmonary;;   BRONCHIAL BRUSHINGS  05/10/2022   Procedure: BRONCHIAL BRUSHINGS;  Surgeon: Leslye Peer, MD;  Location: [facility] ENDOSCOPY;  Service: Pulmonary;;   BRONCHIAL NEEDLE ASPIRATION BIOPSY  05/10/2022   Procedure: BRONCHIAL NEEDLE ASPIRATION BIOPSIES;  Surgeon: Leslye Peer, MD;  Location: [facility] ENDOSCOPY;  Service: Pulmonary;;   COLONOSCOPY  2007   pt does not know MD name/normal exam per pt.   FIDUCIAL MARKER PLACEMENT  05/10/2022   Procedure: FIDUCIAL DYE MARKING;  Surgeon: Leslye Peer, MD;  Location: [facility] ENDOSCOPY;   Service: Pulmonary;;   INTERCOSTAL NERVE BLOCK Right 05/10/2022   Procedure: INTERCOSTAL NERVE BLOCK;  Surgeon: Corliss Skains, MD;  Location: [facility] OR;  Service: Thoracic;  Laterality: Right;   NODE DISSECTION Right 05/10/2022   Procedure: NODE DISSECTION;  Surgeon: Corliss Skains, MD;  Location: [facility] OR;  Service: Thoracic;  Laterality: Right;   PROSTATECTOMY  4 years ago    REVIEW OF SYSTEMS:  A comprehensive review of systems was negative.   PHYSICAL EXAMINATION: General appearance: alert, cooperative, and no distress Head: Normocephalic, without obvious abnormality, atraumatic Neck: no adenopathy, no JVD, supple, symmetrical, trachea midline, and thyroid not enlarged, symmetric, no tenderness/mass/nodules Lymph nodes: Cervical, supraclavicular, and axillary nodes normal. Resp: clear to auscultation bilaterally Back: symmetric, no curvature. ROM normal. No CVA tenderness. Cardio: regular rate and rhythm, S1, S2 normal, no murmur, click, rub or gallop GI: soft, non-tender; bowel sounds normal; no masses,  no organomegaly Extremities: extremities normal, atraumatic, no cyanosis or edema  ECOG PERFORMANCE STATUS: 1 - Symptomatic but completely ambulatory  Blood pressure (!) 163/70, pulse 75, temperature 98.2 F (36.8 C), temperature source Temporal, resp. rate 16, height 5\' 8"  (1.727 m), weight 183 lb 4.8 oz (83.1 kg), SpO2 100%.  LABORATORY DATA: Lab Results  Component Value Date   WBC 9.3 10/17/2023   HGB 14.2 10/17/2023   HCT 41.4 10/17/2023   MCV 92.2 10/17/2023   PLT 202 10/17/2023      Chemistry      Component Value Date/Time   NA 137 10/17/2023 1232   NA 138 04/27/2022 0847   K 3.8 10/17/2023 1232   CL 106 10/17/2023 1232   CO2 25 10/17/2023 1232   BUN 19 10/17/2023 1232   BUN 19 04/27/2022 0847   CREATININE 1.05 10/17/2023 1232   CREATININE 0.97 07/23/2020 0827      Component Value Date/Time   CALCIUM 9.9 10/17/2023 1232   ALKPHOS 59 10/17/2023 1232    AST 20 10/17/2023 1232   ALT 21 10/17/2023 1232   BILITOT 0.4 10/17/2023 1232       RADIOGRAPHIC STUDIES: NM PET Image Restage (PS) Skull Base to Thigh (F-18 FDG) Result Date: 11/15/2023 CLINICAL DATA:  Subsequent treatment strategy for non-small cell lung cancer. Enlarging LEFT lobe pulmonary nodule EXAM: NUCLEAR MEDICINE PET SKULL BASE TO THIGH TECHNIQUE: 9.3 mCi F-18 FDG was injected intravenously. Full-ring PET imaging was performed from the skull base to thigh after the radiotracer. CT data was obtained and used for attenuation correction and anatomic localization. Fasting blood glucose: 135 mg/dl COMPARISON:  None Available. FINDINGS: NECK: No hypermetabolic lymph nodes in the neck. Incidental CT findings: None. CHEST: Ovoid nodule in the LEFT lower lobe measuring 21 mm x 9 mm. Nodule has intense metabolic activity with SUV max equal 9.6 (image 81 of the fused data set). No additional  hypermetabolic nodules. Postsurgical change in the RIGHT upper lobe. No evidence of local recurrence in the RIGHT lung. No hypermetabolic mediastinal lymph nodes. Incidental CT findings: Coronary artery calcification and aortic atherosclerotic calcification. ABDOMEN/PELVIS: No abnormal hypermetabolic activity within the liver, pancreas, adrenal glands, or spleen. No hypermetabolic lymph nodes in the abdomen or pelvis. Incidental CT findings: None. SKELETON: No focal hypermetabolic activity to suggest skeletal metastasis. Incidental CT findings: None. IMPRESSION: 1. Hypermetabolic LEFT lower lobe pulmonary nodule is most consistent with bronchogenic carcinoma. 2. No evidence of metastatic adenopathy in the chest. 3. No evidence of distant metastatic disease. 4. Postsurgical change in the RIGHT upper lobe without evidence of local recurrence. Electronically Signed   By: Genevive Bi M.D.   On: 11/15/2023 09:27   CT Chest W Contrast Result Date: 10/21/2023 CLINICAL DATA:  Non-small cell lung cancer. Prostate cancer.  Chronic cough. * Tracking Code: BO * EXAM: CT CHEST WITH CONTRAST TECHNIQUE: Multidetector CT imaging of the chest was performed during intravenous contrast administration. RADIATION DOSE REDUCTION: This exam was performed according to the departmental dose-optimization program which includes automated exposure control, adjustment of the mA and/or kV according to patient size and/or use of iterative reconstruction technique. CONTRAST:  75mL OMNIPAQUE IOHEXOL 300 MG/ML  SOLN COMPARISON:  04/21/2023. FINDINGS: Cardiovascular: Atherosclerotic calcification of the aorta and coronary arteries. Heart is at the upper limits of normal in size. No pericardial effusion. Mediastinum/Nodes: Thoracic inlet lymph nodes are not enlarged by CT size criteria. 1.4 cm low-attenuation right thyroid nodule. No follow-up recommended. (Ref: J Am Coll Radiol. 2015 Feb;12(2): 143-50).Mediastinal lymph nodes are not enlarged by CT size criteria. No hilar or axillary adenopathy. Esophagus is grossly unremarkable. Lungs/Pleura: Centrilobular and paraseptal emphysema. Right upper lobectomy. Anterolateral left lower lobe nodule measures 0.9 x 1.8 cm (7/102). 5 mm medial left lower lobe nodule (7/125), unchanged. No pleural fluid. Airway is unremarkable. Upper Abdomen: Slight thickening of the body of the right adrenal gland and medial limb left adrenal gland, unchanged. No specific follow-up necessary other than on routine imaging. Visualized portions of the liver, gallbladder, adrenal glands, kidneys, spleen, pancreas, stomach and bowel are otherwise grossly unremarkable. No upper abdominal adenopathy. Musculoskeletal: Degenerative changes in the spine. Old L1 compression fracture. No worrisome lytic or sclerotic lesions. IMPRESSION: 1. 0.9 x 1.8 cm left lower lobe nodule, highly worrisome for disease recurrence. 2. Aortic atherosclerosis (ICD10-I70.0). Coronary artery calcification. 3.  Emphysema (ICD10-J43.9). Electronically Signed   By:  Leanna Battles M.D.   On: 10/21/2023 12:45    ASSESSMENT AND PLAN: This is a very pleasant 68 years old white male with stage IA (T1c, N0, M0) non-small cell lung cancer, squamous cell carcinoma presented with right upper lobe in July 2023 status post right upper lobectomy with lymph node sampling under the care of Dr. Cliffton Asters on 05/10/2022 with tumor size of 2.3 cm.  The patient is currently on observation He had repeat PET scan performed recently that showed hypermetabolic left lower lobe lung nodule with no other evidence of metastatic disease to the mediastinal lymph node or distant metastasis.    Suspicious pulmonary nodule in the left lung A hypermetabolic pulmonary nodule in the left lower lobe, identified on PET scan, is concerning for a new primary lung cancer, distinct from the previous right-sided non-small cell lung cancer. The nodule measures approximately 2.1 x 0.9 cm. There is no evidence of metastasis, lymph node involvement, or recurrence of the previous cancer on the right side. Due to the prior right upper  lobectomy, further surgical intervention on the left lung is not feasible. A biopsy is necessary to confirm the type of cancer, after which stereotactic body radiotherapy may be considered. - Refer to pulmonologist for biopsy of the left lung nodule - Plan for stereotactic body radiotherapy pending biopsy results  Stage 1A non-small cell lung cancer, squamous cell carcinoma Diagnosed in July 2023 with stage 1A non-small cell lung cancer, squamous cell carcinoma, in the right upper lobe. Status post right upper lobectomy with no evidence of recurrence on the right side.   The patient was advised to call immediately if he has any concerning symptoms in the interval. The patient voices understanding of current disease status and treatment options and is in agreement with the current care plan.  All questions were answered. The patient knows to call the clinic with any problems,  questions or concerns. We can certainly see the patient much sooner if necessary.  The total time spent in the appointment was 30 minutes.  Disclaimer: This note was dictated with voice recognition software. Similar sounding words can inadvertently be transcribed and may not be corrected upon review.       Lab and scan before next MD visit with Dr. Arbutus Ped in 3 months.

## 2023-11-16 ENCOUNTER — Telehealth: Payer: Self-pay | Admitting: Internal Medicine

## 2023-11-16 NOTE — Telephone Encounter (Signed)
 Scheduled appointment per 4/01 LOS notes. The patient requested the end of may due to a bone marrow biopsy scheduling conflict.

## 2023-11-24 ENCOUNTER — Ambulatory Visit: Payer: PPO | Admitting: *Deleted

## 2023-11-24 DIAGNOSIS — E538 Deficiency of other specified B group vitamins: Secondary | ICD-10-CM | POA: Diagnosis not present

## 2023-11-24 MED ORDER — CYANOCOBALAMIN 1000 MCG/ML IJ SOLN
1000.0000 ug | Freq: Once | INTRAMUSCULAR | Status: AC
Start: 2023-11-24 — End: 2023-11-24
  Administered 2023-11-24: 1000 ug via INTRAMUSCULAR

## 2023-11-24 NOTE — Progress Notes (Signed)
 Patient presents for B12 injection today. Patient received her B12 injection in Right Deltoid. Patient tolerated injection well.  Documentation entered in [facility] in [employer].  Advise to return in 2 week to recheck Vit b 12

## 2023-11-29 ENCOUNTER — Other Ambulatory Visit: Payer: Self-pay | Admitting: *Deleted

## 2023-11-29 DIAGNOSIS — E785 Hyperlipidemia, unspecified: Secondary | ICD-10-CM

## 2023-12-08 ENCOUNTER — Other Ambulatory Visit (INDEPENDENT_AMBULATORY_CARE_PROVIDER_SITE_OTHER)

## 2023-12-08 DIAGNOSIS — E538 Deficiency of other specified B group vitamins: Secondary | ICD-10-CM | POA: Diagnosis not present

## 2023-12-08 LAB — VITAMIN B12: Vitamin B-12: 385 pg/mL (ref 211–911)

## 2023-12-09 ENCOUNTER — Encounter: Payer: Self-pay | Admitting: Family Medicine

## 2023-12-09 NOTE — Progress Notes (Signed)
 B12 is proving.  Recommend continuing supplementation at least 1000 mcg daily.  We can recheck in 3 to 6 months.

## 2023-12-20 DIAGNOSIS — L821 Other seborrheic keratosis: Secondary | ICD-10-CM | POA: Diagnosis not present

## 2023-12-20 DIAGNOSIS — D2262 Melanocytic nevi of left upper limb, including shoulder: Secondary | ICD-10-CM | POA: Diagnosis not present

## 2023-12-20 DIAGNOSIS — D485 Neoplasm of uncertain behavior of skin: Secondary | ICD-10-CM | POA: Diagnosis not present

## 2023-12-20 DIAGNOSIS — L814 Other melanin hyperpigmentation: Secondary | ICD-10-CM | POA: Diagnosis not present

## 2023-12-20 DIAGNOSIS — L570 Actinic keratosis: Secondary | ICD-10-CM | POA: Diagnosis not present

## 2023-12-20 DIAGNOSIS — Z85828 Personal history of other malignant neoplasm of skin: Secondary | ICD-10-CM | POA: Diagnosis not present

## 2023-12-20 DIAGNOSIS — D0359 Melanoma in situ of other part of trunk: Secondary | ICD-10-CM | POA: Diagnosis not present

## 2023-12-20 DIAGNOSIS — D225 Melanocytic nevi of trunk: Secondary | ICD-10-CM | POA: Diagnosis not present

## 2023-12-20 DIAGNOSIS — D2261 Melanocytic nevi of right upper limb, including shoulder: Secondary | ICD-10-CM | POA: Diagnosis not present

## 2023-12-20 DIAGNOSIS — L905 Scar conditions and fibrosis of skin: Secondary | ICD-10-CM | POA: Diagnosis not present

## 2023-12-23 ENCOUNTER — Encounter: Payer: Self-pay | Admitting: Emergency Medicine

## 2023-12-23 ENCOUNTER — Other Ambulatory Visit: Payer: Self-pay

## 2023-12-23 ENCOUNTER — Encounter (HOSPITAL_COMMUNITY): Payer: Self-pay | Admitting: Emergency Medicine

## 2023-12-23 ENCOUNTER — Ambulatory Visit: Admitting: Emergency Medicine

## 2023-12-23 VITALS — BP 167/80 | HR 71 | Ht 68.0 in | Wt 184.8 lb

## 2023-12-23 DIAGNOSIS — R911 Solitary pulmonary nodule: Secondary | ICD-10-CM

## 2023-12-23 DIAGNOSIS — F17200 Nicotine dependence, unspecified, uncomplicated: Secondary | ICD-10-CM

## 2023-12-23 NOTE — Progress Notes (Signed)
 SDW call  Patient was given pre-op instructions over the phone. Patient verbalized understanding of instructions provided.     PCP - Dr. Valdene Garret Cardiologist - Dr. Maximo Spar Oncologist. Dr. Marlene Simas Pulmonary:    PPM/ICD - denies Device Orders - na Rep Notified - na   Chest x-ray - na EKG -  DOS, 12/26/2023 Stress Test - 04/15/2022 ECHO -  Cardiac Cath -   Sleep Study/sleep apnea/CPAP: diagnosed with sleep apnea, does not wear a CPAP  Type II diabetic Fasting Blood sugar range: does not check his blood sugar How often check sugars: does not check his blood sugar Metformin , instructed to hold day of surgery   Blood Thinner Instructions: denies Aspirin  Instructions:states last dose 12/23/2023   ERAS Protcol - NPO   Anesthesia review: Yes. HTN, DM, sleep apnea, HLD   Patient denies shortness of breath, fever, cough and chest pain over the phone call  Your procedure is scheduled on Monday Dec 26, 2023  Report to [facility] Main Entrance "A" at 0530   A.M., then check in with the Admitting office.  Call this number if you have problems the morning of surgery:  [phone]   If you have any questions prior to your surgery date call [phone]: Open Monday-Friday 8am-4pm If you experience any cold or flu symptoms such as cough, fever, chills, shortness of breath, etc. between now and your scheduled surgery, please notify us  at the above number     Remember:  Do not eat or drink after midnight the night before your surgery  Take these medicines the morning of surgery with A SIP OF WATER:  Fenofibrate   As of today, STOP taking any Aspirin  (unless otherwise instructed by your surgeon) Aleve, Naproxen, Ibuprofen, Motrin, Advil, Goody's, BC's, all herbal medications, fish oil, and all vitamins.

## 2023-12-23 NOTE — Assessment & Plan Note (Signed)
 New rounded hypermetabolic left lower lobe pulmonary nodule in a patient with a history of non-small cell lung cancer.  Suspicious for a new primary given the fact that there is no mediastinal adenopathy, no evidence of any other disease.  We discussed the pros and cons of bronchoscopy for biopsy to facilitate treatment.  He wants to proceed.  I will work on getting this set up on 12/26/2023 if at all possible  We reviewed your CT scan of the chest and your PET scan today. We will arrange navigational bronchoscopy to evaluate a left lower lobe pulmonary nodule.  This will be done under general anesthesia as an outpatient at [facility] endoscopy.  You will need a designated driver and someone to watch you at home that day after the procedure.  You will need to stop your aspirin  2 days prior.  Will try to get this scheduled for 12/26/2023. Follow with Dr. Marguerita Shih as planned

## 2023-12-23 NOTE — Patient Instructions (Signed)
 We reviewed your CT scan of the chest and your PET scan today. We will arrange navigational bronchoscopy to evaluate a left lower lobe pulmonary nodule.  This will be done under general anesthesia as an outpatient at [facility] endoscopy.  You will need a designated driver and someone to watch you at home that day after the procedure.  You will need to stop your aspirin  2 days prior.  Will try to get this scheduled for 12/26/2023. Follow with Dr. Marguerita Shih as planned

## 2023-12-23 NOTE — H&P (View-Only) (Signed)
 Subjective:    Patient ID: Gregory Jensen, male    DOB: Apr 22, 1956, 68 y.o.   MRN: [id number]  HPI  ROV 12/23/2023 --Gregory Jensen is 68 with a history of tobacco use (49 pack years).  Have seen him principally for abnormal chest imaging, 11 mm right upper lobe pulmonary nodule that was dye marked and then resected by Dr. Deloise Ferries 05/10/2022.  He is mixed obstruction and restriction on pulmonary function testing prior to that surgery, FEV1 75% predicted.  His surveillance has been with Dr. Marguerita Shih, CT chest and PET scan done in March.  He is not on any bronchodilator therapy currently.  PMH significant for diabetes, prostate cancer, hyperlipidemia and sleep apnea. Today he reports that he has been dealing w BLE neuropathy, affects his walking and strength in his legs. No real SOB. He coughs daily, usually dry with some throat irritation. No sputum. He is back up to smoking 1 pk/day.   CT scan of the chest done 10/17/2023 reviewed by me showed a 1.4 cm low-attenuation right thyroid  nodule, no mediastinal or hilar adenopathy, some emphysematous change, changes from his right upper lobe lobectomy.  There is an anterior lateral left lower lobe 18 x 9 mm nodule and a 5 mm medial left lower lobe nodule.  PET scan 11/03/2023 reviewed by me showed intense metabolic activity in the 21 x 9 mm left lower lobe pulmonary nodule.  No hypermetabolic nodes or nodules elsewhere.  No evidence of distant disease.  No evidence of local recurrence on the right.   Review of Systems As per HPI  Past Medical History:  Diagnosis Date   Cancer (HCC) 4 years ago   Prostate Cancer , Lung Cancer   Diabetes mellitus without complication (HCC)    type 2   Hyperlipidemia    Hypertension    Neuromuscular disorder (HCC)    neuropathy   Neuropathy    in legs causing balance issues per patient   Sleep apnea    Smoker      Family History  Problem Relation Age of Onset   Alzheimer's disease Mother    Heart  disease Father    Hypertension Sister    Heart disease Sister    Hypertension Sister    Stomach cancer Brother    Diabetes Other        family hx   Colon cancer Neg Hx    Colon polyps Neg Hx    Esophageal cancer Neg Hx    Rectal cancer Neg Hx      Social History   Socioeconomic History   Marital status: Significant Other    Spouse name: Devra Fontana   Number of children: 1   Years of education: 12   Highest education level: Not on file  Occupational History   Occupation: [employer]    Comment: Retired  Tobacco Use   Smoking status: Every Day    Current packs/day: 1.00    Average packs/day: 1 pack/day for 47.0 years (47.0 ttl pk-yrs)    Types: Cigarettes   Smokeless tobacco: Never   Tobacco comments:    1 pack of cigarettes smoked daily 12/23/2023  Vaping Use   Vaping status: Never Used  Substance and Sexual Activity   Alcohol use: Not Currently   Drug use: Never   Sexual activity: Not on file  Other Topics Concern   Not on file  Social History Narrative   Lives with wife. Has one son who lives in [place].    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        Are you right handed or left handed? Right Handed   Are you currently employed ? No- Retired   What is your current occupation? No   Do you live at home alone? No   Who lives with you? Devra Fontana- Significant other   What type of home do you live in: 1 story or 2 story? One story home       Social Drivers of Health   Financial Resource Strain: Low Risk  (08/09/2023)   Overall Financial Resource Strain (CARDIA)    Difficulty of Paying Living Expenses: Not hard at all  Food Insecurity: No Food Insecurity (08/09/2023)   Hunger Vital Sign    Worried About Running Out of Food in the Last Year: Never true    Ran Out of Food in the Last Year: Never true  Transportation Needs: No Transportation Needs (08/09/2023)   PRAPARE - Administrator, Civil Service (Medical): No     Lack of Transportation (Non-Medical): No  Physical Activity: Insufficiently Active (08/09/2023)   Exercise Vital Sign    Days of Exercise per Week: 5 days    Minutes of Exercise per Session: 10 min  Stress: No Stress Concern Present (08/09/2023)   [employer] of Occupational Health - Occupational Stress Questionnaire    Feeling of Stress : Not at all  Social Connections: Moderately Isolated (08/09/2023)   Social Connection and Isolation Panel [NHANES]    Frequency of Communication with Friends and Family: More than three times a week    Frequency of Social Gatherings with Friends and Family: More than three times a week    Attends Religious Services: Never    Database administrator or Organizations: No    Attends Banker Meetings: Never    Marital Status: Married  Catering manager Violence: Not At Risk (08/09/2023)   Humiliation, Afraid, Rape, and Kick questionnaire    Fear of Current or Ex-Partner: No    Emotionally Abused: No    Physically Abused: No    Sexually Abused: No     Allergies  Allergen Reactions   Jardiance  [Empagliflozin ] Other (See Comments)    Candidal balanitis     Outpatient Medications Prior to Visit  Medication Sig Dispense Refill   aspirin  81 MG EC tablet Take 81 mg by mouth in the morning.     atorvastatin  (LIPITOR) 40 MG tablet TAKE 1 TABLET BY MOUTH EVERY DAY 90 tablet 0   Blood Glucose Monitoring Suppl (TRUE METRIX METER) w/Device KIT USE AS DIRECTED 1 kit 0   fenofibrate  (TRICOR ) 145 MG tablet TAKE 1 TABLET BY MOUTH EVERY DAY 90 tablet 3   glucose blood (ONETOUCH VERIO) test strip 1 each by Other route daily as needed for other. Use as instructed 100 each 4   ibuprofen (ADVIL) 200 MG tablet Take 400-600 mg by mouth daily as needed (pain.).     losartan  (COZAAR ) 50 MG tablet TAKE 1 TABLET BY MOUTH EVERY DAY 90 tablet 1   meloxicam  (MOBIC ) 15 MG tablet TAKE 1 TABLET (15 MG TOTAL) BY MOUTH DAILY. 30 tablet 0   metFORMIN  (GLUCOPHAGE )  500 MG tablet TAKE 1 TABLET BY MOUTH TWICE A DAY WITH FOOD 180 tablet 1   OneTouch Delica Lancets 33G MISC 1 each by Does not apply route daily as needed. 100 each 4   No facility-administered medications prior to visit.  Objective:   Physical Exam  Vitals:   12/23/23 0821 12/23/23 0822  BP: (!) 161/77 (!) 167/80  Pulse: 71   SpO2: 98%   Weight: 184 lb 12.8 oz (83.8 kg)   Height: 5\' 8"  (1.727 m)    Gen: Pleasant, well-nourished, in no distress,  normal affect  ENT: No lesions,  mouth clear,  oropharynx clear, no postnasal drip  Neck: No JVD, no stridor  Lungs: No use of accessory muscles, no crackles or wheezing on normal respiration, no wheeze on forced expiration  Cardiovascular: RRR, heart sounds normal, no murmur or gallops, no peripheral edema  Musculoskeletal: No deformities, no cyanosis or clubbing  Neuro: alert, awake, non focal  Skin: Warm, no lesions or rash      Assessment & Plan:  Pulmonary nodule New rounded hypermetabolic left lower lobe pulmonary nodule in a patient with a history of non-small cell lung cancer.  Suspicious for a new primary given the fact that there is no mediastinal adenopathy, no evidence of any other disease.  We discussed the pros and cons of bronchoscopy for biopsy to facilitate treatment.  He wants to proceed.  I will work on getting this set up on 12/26/2023 if at all possible  We reviewed your CT scan of the chest and your PET scan today. We will arrange navigational bronchoscopy to evaluate a left lower lobe pulmonary nodule.  This will be done under general anesthesia as an outpatient at [facility] endoscopy.  You will need a designated driver and someone to watch you at home that day after the procedure.  You will need to stop your aspirin  2 days prior.  Will try to get this scheduled for 12/26/2023. Follow with Dr. Marguerita Shih as planned  Nicotine dependence with current use He is back up to smoking 1 pack daily.  He still  denies any exertional dyspnea.  He does have a dry cough.  Would be reasonable for us  to consider repeat pulmonary function testing at some point going forward to assess his degree of obstruction.  Will hold off on any BD therapy for now  Time spent 42 minutes  Racheal Buddle, MD, PhD 12/23/2023, 8:58 AM [facility] [phone] or if no answer before 7:00PM call [phone] For any issues after 7:00PM please call eLink [phone]

## 2023-12-23 NOTE — Assessment & Plan Note (Signed)
 He is back up to smoking 1 pack daily.  He still denies any exertional dyspnea.  He does have a dry cough.  Would be reasonable for us  to consider repeat pulmonary function testing at some point going forward to assess his degree of obstruction.  Will hold off on any BD therapy for now

## 2023-12-23 NOTE — Progress Notes (Signed)
 Subjective:    Patient ID: Gregory Jensen, male    DOB: Apr 22, 1956, 68 y.o.   MRN: [id number]  HPI  ROV 12/23/2023 --Gregory Jensen is 68 with a history of tobacco use (49 pack years).  Have seen him principally for abnormal chest imaging, 11 mm right upper lobe pulmonary nodule that was dye marked and then resected by Dr. Deloise Ferries 05/10/2022.  He is mixed obstruction and restriction on pulmonary function testing prior to that surgery, FEV1 75% predicted.  His surveillance has been with Dr. Marguerita Shih, CT chest and PET scan done in March.  He is not on any bronchodilator therapy currently.  PMH significant for diabetes, prostate cancer, hyperlipidemia and sleep apnea. Today he reports that he has been dealing w BLE neuropathy, affects his walking and strength in his legs. No real SOB. He coughs daily, usually dry with some throat irritation. No sputum. He is back up to smoking 1 pk/day.   CT scan of the chest done 10/17/2023 reviewed by me showed a 1.4 cm low-attenuation right thyroid  nodule, no mediastinal or hilar adenopathy, some emphysematous change, changes from his right upper lobe lobectomy.  There is an anterior lateral left lower lobe 18 x 9 mm nodule and a 5 mm medial left lower lobe nodule.  PET scan 11/03/2023 reviewed by me showed intense metabolic activity in the 21 x 9 mm left lower lobe pulmonary nodule.  No hypermetabolic nodes or nodules elsewhere.  No evidence of distant disease.  No evidence of local recurrence on the right.   Review of Systems As per HPI  Past Medical History:  Diagnosis Date   Cancer (HCC) 4 years ago   Prostate Cancer , Lung Cancer   Diabetes mellitus without complication (HCC)    type 2   Hyperlipidemia    Hypertension    Neuromuscular disorder (HCC)    neuropathy   Neuropathy    in legs causing balance issues per patient   Sleep apnea    Smoker      Family History  Problem Relation Age of Onset   Alzheimer's disease Mother    Heart  disease Father    Hypertension Sister    Heart disease Sister    Hypertension Sister    Stomach cancer Brother    Diabetes Other        family hx   Colon cancer Neg Hx    Colon polyps Neg Hx    Esophageal cancer Neg Hx    Rectal cancer Neg Hx      Social History   Socioeconomic History   Marital status: Significant Other    Spouse name: Devra Fontana   Number of children: 1   Years of education: 12   Highest education level: Not on file  Occupational History   Occupation: [employer]    Comment: Retired  Tobacco Use   Smoking status: Every Day    Current packs/day: 1.00    Average packs/day: 1 pack/day for 47.0 years (47.0 ttl pk-yrs)    Types: Cigarettes   Smokeless tobacco: Never   Tobacco comments:    1 pack of cigarettes smoked daily 12/23/2023  Vaping Use   Vaping status: Never Used  Substance and Sexual Activity   Alcohol use: Not Currently   Drug use: Never   Sexual activity: Not on file  Other Topics Concern   Not on file  Social History Narrative   Lives with wife. Has one son who lives in [place].    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        Are you right handed or left handed? Right Handed   Are you currently employed ? No- Retired   What is your current occupation? No   Do you live at home alone? No   Who lives with you? Devra Fontana- Significant other   What type of home do you live in: 1 story or 2 story? One story home       Social Drivers of Health   Financial Resource Strain: Low Risk  (08/09/2023)   Overall Financial Resource Strain (CARDIA)    Difficulty of Paying Living Expenses: Not hard at all  Food Insecurity: No Food Insecurity (08/09/2023)   Hunger Vital Sign    Worried About Running Out of Food in the Last Year: Never true    Ran Out of Food in the Last Year: Never true  Transportation Needs: No Transportation Needs (08/09/2023)   PRAPARE - Administrator, Civil Service (Medical): No     Lack of Transportation (Non-Medical): No  Physical Activity: Insufficiently Active (08/09/2023)   Exercise Vital Sign    Days of Exercise per Week: 5 days    Minutes of Exercise per Session: 10 min  Stress: No Stress Concern Present (08/09/2023)   [employer] of Occupational Health - Occupational Stress Questionnaire    Feeling of Stress : Not at all  Social Connections: Moderately Isolated (08/09/2023)   Social Connection and Isolation Panel [NHANES]    Frequency of Communication with Friends and Family: More than three times a week    Frequency of Social Gatherings with Friends and Family: More than three times a week    Attends Religious Services: Never    Database administrator or Organizations: No    Attends Banker Meetings: Never    Marital Status: Married  Catering manager Violence: Not At Risk (08/09/2023)   Humiliation, Afraid, Rape, and Kick questionnaire    Fear of Current or Ex-Partner: No    Emotionally Abused: No    Physically Abused: No    Sexually Abused: No     Allergies  Allergen Reactions   Jardiance  [Empagliflozin ] Other (See Comments)    Candidal balanitis     Outpatient Medications Prior to Visit  Medication Sig Dispense Refill   aspirin  81 MG EC tablet Take 81 mg by mouth in the morning.     atorvastatin  (LIPITOR) 40 MG tablet TAKE 1 TABLET BY MOUTH EVERY DAY 90 tablet 0   Blood Glucose Monitoring Suppl (TRUE METRIX METER) w/Device KIT USE AS DIRECTED 1 kit 0   fenofibrate  (TRICOR ) 145 MG tablet TAKE 1 TABLET BY MOUTH EVERY DAY 90 tablet 3   glucose blood (ONETOUCH VERIO) test strip 1 each by Other route daily as needed for other. Use as instructed 100 each 4   ibuprofen (ADVIL) 200 MG tablet Take 400-600 mg by mouth daily as needed (pain.).     losartan  (COZAAR ) 50 MG tablet TAKE 1 TABLET BY MOUTH EVERY DAY 90 tablet 1   meloxicam  (MOBIC ) 15 MG tablet TAKE 1 TABLET (15 MG TOTAL) BY MOUTH DAILY. 30 tablet 0   metFORMIN  (GLUCOPHAGE )  500 MG tablet TAKE 1 TABLET BY MOUTH TWICE A DAY WITH FOOD 180 tablet 1   OneTouch Delica Lancets 33G MISC 1 each by Does not apply route daily as needed. 100 each 4   No facility-administered medications prior to visit.  Objective:   Physical Exam  Vitals:   12/23/23 0821 12/23/23 0822  BP: (!) 161/77 (!) 167/80  Pulse: 71   SpO2: 98%   Weight: 184 lb 12.8 oz (83.8 kg)   Height: 5\' 8"  (1.727 m)    Gen: Pleasant, well-nourished, in no distress,  normal affect  ENT: No lesions,  mouth clear,  oropharynx clear, no postnasal drip  Neck: No JVD, no stridor  Lungs: No use of accessory muscles, no crackles or wheezing on normal respiration, no wheeze on forced expiration  Cardiovascular: RRR, heart sounds normal, no murmur or gallops, no peripheral edema  Musculoskeletal: No deformities, no cyanosis or clubbing  Neuro: alert, awake, non focal  Skin: Warm, no lesions or rash      Assessment & Plan:  Pulmonary nodule New rounded hypermetabolic left lower lobe pulmonary nodule in a patient with a history of non-small cell lung cancer.  Suspicious for a new primary given the fact that there is no mediastinal adenopathy, no evidence of any other disease.  We discussed the pros and cons of bronchoscopy for biopsy to facilitate treatment.  He wants to proceed.  I will work on getting this set up on 12/26/2023 if at all possible  We reviewed your CT scan of the chest and your PET scan today. We will arrange navigational bronchoscopy to evaluate a left lower lobe pulmonary nodule.  This will be done under general anesthesia as an outpatient at [facility] endoscopy.  You will need a designated driver and someone to watch you at home that day after the procedure.  You will need to stop your aspirin  2 days prior.  Will try to get this scheduled for 12/26/2023. Follow with Dr. Marguerita Shih as planned  Nicotine dependence with current use He is back up to smoking 1 pack daily.  He still  denies any exertional dyspnea.  He does have a dry cough.  Would be reasonable for us  to consider repeat pulmonary function testing at some point going forward to assess his degree of obstruction.  Will hold off on any BD therapy for now  Time spent 42 minutes  Racheal Buddle, MD, PhD 12/23/2023, 8:58 AM [facility] [phone] or if no answer before 7:00PM call [phone] For any issues after 7:00PM please call eLink [phone]

## 2023-12-26 ENCOUNTER — Other Ambulatory Visit: Payer: Self-pay

## 2023-12-26 ENCOUNTER — Ambulatory Visit (HOSPITAL_COMMUNITY)

## 2023-12-26 ENCOUNTER — Ambulatory Visit (HOSPITAL_BASED_OUTPATIENT_CLINIC_OR_DEPARTMENT_OTHER): Payer: Self-pay | Admitting: Physician Assistant

## 2023-12-26 ENCOUNTER — Ambulatory Visit (HOSPITAL_COMMUNITY): Payer: Self-pay | Admitting: Physician Assistant

## 2023-12-26 ENCOUNTER — Encounter (HOSPITAL_COMMUNITY)
Admission: RE | Disposition: A | Discharge status: Home / Self Care | Payer: Self-pay | Source: Home / Self Care | Attending: Emergency Medicine

## 2023-12-26 ENCOUNTER — Ambulatory Visit (HOSPITAL_COMMUNITY)
Admission: RE | Admit: 2023-12-26 | Discharge: 2023-12-26 | Disposition: A | Discharge status: Home / Self Care | Attending: Emergency Medicine | Admitting: Emergency Medicine

## 2023-12-26 ENCOUNTER — Encounter (HOSPITAL_COMMUNITY): Payer: Self-pay | Admitting: Emergency Medicine

## 2023-12-26 DIAGNOSIS — F17210 Nicotine dependence, cigarettes, uncomplicated: Secondary | ICD-10-CM | POA: Insufficient documentation

## 2023-12-26 DIAGNOSIS — Z48813 Encounter for surgical aftercare following surgery on the respiratory system: Secondary | ICD-10-CM | POA: Diagnosis not present

## 2023-12-26 DIAGNOSIS — I2510 Atherosclerotic heart disease of native coronary artery without angina pectoris: Secondary | ICD-10-CM

## 2023-12-26 DIAGNOSIS — Z79899 Other long term (current) drug therapy: Secondary | ICD-10-CM | POA: Diagnosis not present

## 2023-12-26 DIAGNOSIS — C3432 Malignant neoplasm of lower lobe, left bronchus or lung: Secondary | ICD-10-CM | POA: Insufficient documentation

## 2023-12-26 DIAGNOSIS — G4730 Sleep apnea, unspecified: Secondary | ICD-10-CM | POA: Diagnosis not present

## 2023-12-26 DIAGNOSIS — I10 Essential (primary) hypertension: Secondary | ICD-10-CM | POA: Insufficient documentation

## 2023-12-26 DIAGNOSIS — R911 Solitary pulmonary nodule: Secondary | ICD-10-CM | POA: Diagnosis not present

## 2023-12-26 DIAGNOSIS — R079 Chest pain, unspecified: Secondary | ICD-10-CM | POA: Diagnosis not present

## 2023-12-26 DIAGNOSIS — E1142 Type 2 diabetes mellitus with diabetic polyneuropathy: Secondary | ICD-10-CM | POA: Diagnosis not present

## 2023-12-26 DIAGNOSIS — E119 Type 2 diabetes mellitus without complications: Secondary | ICD-10-CM | POA: Diagnosis not present

## 2023-12-26 DIAGNOSIS — Z8249 Family history of ischemic heart disease and other diseases of the circulatory system: Secondary | ICD-10-CM | POA: Diagnosis not present

## 2023-12-26 DIAGNOSIS — Z833 Family history of diabetes mellitus: Secondary | ICD-10-CM | POA: Insufficient documentation

## 2023-12-26 DIAGNOSIS — Z7984 Long term (current) use of oral hypoglycemic drugs: Secondary | ICD-10-CM | POA: Diagnosis not present

## 2023-12-26 DIAGNOSIS — E785 Hyperlipidemia, unspecified: Secondary | ICD-10-CM | POA: Insufficient documentation

## 2023-12-26 DIAGNOSIS — G709 Myoneural disorder, unspecified: Secondary | ICD-10-CM | POA: Diagnosis not present

## 2023-12-26 DIAGNOSIS — Z8546 Personal history of malignant neoplasm of prostate: Secondary | ICD-10-CM | POA: Insufficient documentation

## 2023-12-26 HISTORY — PX: FIDUCIAL MARKER PLACEMENT: SHX6858

## 2023-12-26 HISTORY — PX: VIDEO BRONCHOSCOPY WITH ENDOBRONCHIAL NAVIGATION: SHX6175

## 2023-12-26 HISTORY — PX: BRONCHIAL BIOPSY: SHX5109

## 2023-12-26 HISTORY — PX: BRONCHIAL BRUSHINGS: SHX5108

## 2023-12-26 HISTORY — PX: BRONCHIAL NEEDLE ASPIRATION BIOPSY: SHX5106

## 2023-12-26 LAB — CBC
HCT: 43 % (ref 39.0–52.0)
Hemoglobin: 14.9 g/dL (ref 13.0–17.0)
MCH: 31.8 pg (ref 26.0–34.0)
MCHC: 34.7 g/dL (ref 30.0–36.0)
MCV: 91.9 fL (ref 80.0–100.0)
Platelets: 202 10*3/uL (ref 150–400)
RBC: 4.68 MIL/uL (ref 4.22–5.81)
RDW: 12.4 % (ref 11.5–15.5)
WBC: 9.7 10*3/uL (ref 4.0–10.5)
nRBC: 0 % (ref 0.0–0.2)

## 2023-12-26 LAB — BASIC METABOLIC PANEL WITH GFR
Anion gap: 10 (ref 5–15)
BUN: 13 mg/dL (ref 8–23)
CO2: 23 mmol/L (ref 22–32)
Calcium: 9.3 mg/dL (ref 8.9–10.3)
Chloride: 105 mmol/L (ref 98–111)
Creatinine, Ser: 1.15 mg/dL (ref 0.61–1.24)
GFR, Estimated: >60 mL/min (ref >60)
Glucose, Bld: 169 mg/dL — ABNORMAL HIGH (ref 70–99)
Potassium: 3.9 mmol/L (ref 3.5–5.1)
Sodium: 138 mmol/L (ref 135–145)

## 2023-12-26 LAB — GLUCOSE, CAPILLARY
Glucose-Capillary: 158 mg/dL — ABNORMAL HIGH (ref 70–99)
Glucose-Capillary: 154 mg/dL — ABNORMAL HIGH (ref 70–99)

## 2023-12-26 SURGERY — VIDEO BRONCHOSCOPY WITH ENDOBRONCHIAL NAVIGATION
Anesthesia: General | Laterality: Left

## 2023-12-26 MED ORDER — PROPOFOL 500 MG/50ML IV EMUL
INTRAVENOUS | Status: DC | PRN
  Administered 2023-12-26: 125 ug/kg/min via INTRAVENOUS

## 2023-12-26 MED ORDER — FENTANYL CITRATE (PF) 100 MCG/2ML IJ SOLN: 25.0000 ug | INTRAMUSCULAR | Status: DC | PRN

## 2023-12-26 MED ORDER — ONDANSETRON HCL 4 MG/2ML IJ SOLN
INTRAMUSCULAR | Status: DC | PRN
  Administered 2023-12-26: 4 mg via INTRAVENOUS

## 2023-12-26 MED ORDER — ATORVASTATIN CALCIUM 40 MG PO TABS: 40.0000 mg | ORAL_TABLET | Freq: Every evening | ORAL | Status: DC

## 2023-12-26 MED ORDER — ACETAMINOPHEN 500 MG PO TABS
1000.0000 mg | ORAL_TABLET | Freq: Once | ORAL | Status: AC
  Administered 2023-12-26: 1000 mg via ORAL
  Filled 2023-12-26: qty 2

## 2023-12-26 MED ORDER — AMISULPRIDE (ANTIEMETIC) 5 MG/2ML IV SOLN: 10.0000 mg | Freq: Once | INTRAVENOUS | Status: DC | PRN

## 2023-12-26 MED ORDER — LIDOCAINE 2% (20 MG/ML) 5 ML SYRINGE
INTRAMUSCULAR | Status: DC | PRN
  Administered 2023-12-26: 60 mg via INTRAVENOUS

## 2023-12-26 MED ORDER — INSULIN ASPART 100 UNIT/ML IJ SOLN: 0.0000 [IU] | INTRAMUSCULAR | Status: DC | PRN

## 2023-12-26 MED ORDER — SUGAMMADEX SODIUM 200 MG/2ML IV SOLN
INTRAVENOUS | Status: DC | PRN
  Administered 2023-12-26: 200 mg via INTRAVENOUS

## 2023-12-26 MED ORDER — LACTATED RINGERS IV SOLN: INTRAVENOUS | Status: DC

## 2023-12-26 MED ORDER — PROPOFOL 10 MG/ML IV BOLUS
INTRAVENOUS | Status: DC | PRN
  Administered 2023-12-26: 50 mg via INTRAVENOUS
  Administered 2023-12-26: 150 mg via INTRAVENOUS

## 2023-12-26 MED ORDER — FENTANYL CITRATE (PF) 250 MCG/5ML IJ SOLN
INTRAMUSCULAR | Status: DC | PRN
Start: 2023-12-26 — End: 2023-12-26
  Administered 2023-12-26: 100 ug via INTRAVENOUS

## 2023-12-26 MED ORDER — CHLORHEXIDINE GLUCONATE 0.12 % MT SOLN
15.0000 mL | Freq: Once | OROMUCOSAL | Status: AC
  Administered 2023-12-26: 15 mL via OROMUCOSAL
  Filled 2023-12-26: qty 15

## 2023-12-26 MED ORDER — ROCURONIUM BROMIDE 10 MG/ML (PF) SYRINGE
PREFILLED_SYRINGE | INTRAVENOUS | Status: DC | PRN
  Administered 2023-12-26: 50 mg via INTRAVENOUS
  Administered 2023-12-26: 20 mg via INTRAVENOUS

## 2023-12-26 MED ORDER — FENTANYL CITRATE (PF) 100 MCG/2ML IJ SOLN
INTRAMUSCULAR | Status: AC
  Filled 2023-12-26: qty 2

## 2023-12-26 NOTE — Discharge Instructions (Addendum)

## 2023-12-26 NOTE — Op Note (Signed)
 Video Bronchoscopy with Robotic Assisted Bronchoscopic Navigation   Date of Operation: 12/26/2023   Pre-op Diagnosis: Left lower lobe nodule  Post-op Diagnosis: Same  Surgeon: Racheal Buddle n  Assistants: None  Anesthesia: General endotracheal anesthesia  Operation: Flexible video fiberoptic bronchoscopy with robotic assistance and biopsies.  Estimated Blood Loss: Minimal  Complications: None  Indications and History: Gregory Jensen is a 68 y.o. male with history of non-small cell lung cancer that was surgically resected.  Surveillance imaging revealed a new hypermetabolic left lower lobe pulmonary nodule.  Recommendation made to achieve a tissue diagnosis via robotic assisted navigational bronchoscopy.  The risks, benefits, complications, treatment options and expected outcomes were discussed with the patient.  The possibilities of pneumothorax, pneumonia, reaction to medication, pulmonary aspiration, perforation of a viscus, bleeding, failure to diagnose a condition and creating a complication requiring transfusion or operation were discussed with the patient who freely signed the consent.    Description of Procedure: The patient was seen in the Preoperative Area, was examined and was deemed appropriate to proceed.  The patient was taken to [facility] Endoscopy room 3, identified as Gregory Jensen and the procedure verified as Flexible Video Fiberoptic Bronchoscopy.  A Time Out was held and the above information confirmed.   Prior to the date of the procedure a high-resolution CT scan of the chest was performed. Utilizing ION software program a virtual tracheobronchial tree was generated to allow the creation of distinct navigation pathways to the patient's parenchymal abnormalities. After being taken to the operating room general anesthesia was initiated and the patient  was orally intubated. The video fiberoptic bronchoscope was introduced via the endotracheal tube and a general  inspection was performed which showed surgically absent right upper lobe with an intact staple line.  The right lower lobe airways were normal.  There was a small raised area of airway mucosa on the lateral aspect of the right middle lobe orifice.  Endobronchial brushings and forceps biopsies were performed in this area.  The left lung was inspected and was normal in appearance. Aspiration of the bilateral mainstems was completed to remove any remaining secretions. Robotic catheter inserted into patient's endotracheal tube.   Target #1 left lower lobe nodule: The distinct navigation pathways prepared prior to this procedure were then utilized to navigate to patient's lesion identified on CT scan. The robotic catheter was secured into place and the vision probe was withdrawn.  Lesion location was approximated using fluoroscopy.  Local registration and targeting was performed using Siem[employer] Cios mobile C-arm three-dimensional imaging. Under fluoroscopic guidance transbronchial needle biopsies and transbronchial forceps biopsies were performed to be sent for cytology and pathology.  Needle-in-lesion was confirmed using Cios mobile C-arm.  Under fluoroscopic guidance a single fiducial marker was placed adjacent to the nodule.  On subsequent video inspection the fiducial marker was quite proximal, able to be seen with the standard bronchoscope in the left lower lobe airway.  There was evidence that it was not secured adequately and for that reason it was retrieved with a biopsy forceps and removed.   At the end of the procedure a general airway inspection was performed and there was no evidence of active bleeding. The bronchoscope was removed.  The patient tolerated the procedure well. There was no significant blood loss and there were no obvious complications. A post-procedural chest x-ray is pending.  Samples Target #1: Transbronchial Wang needle biopsies from left lower lobe nodule 2.   Transbronchial forceps biopsies from left lower lobe  nodule  Endobronchial samples: 1. Endobronchial forceps biopsies from right middle lobe airway 2.  Endobronchial brushings from right middle lobe airway   Plans:  The patient will be discharged from the PACU to home when recovered from anesthesia and after chest x-ray is reviewed. We will review the cytology, pathology and microbiology results with the patient when they become available. Outpatient followup will be with Dr. Baldwin Levee and Dr. Marguerita Shih.    Racheal Buddle, MD, PhD 12/26/2023, 8:39 AM [facility] [phone] or if no answer before 7:00PM call [phone] For any issues after 7:00PM please call eLink [phone]

## 2023-12-26 NOTE — Anesthesia Preprocedure Evaluation (Signed)
 Anesthesia Evaluation  Patient identified by MRN, date of birth, ID band Patient awake    Reviewed: Allergy & Precautions, NPO status , Patient's Chart, lab work & pertinent test results  Airway Mallampati: II  TM Distance: >3 FB Neck ROM: Full    Dental  (+) Dental Advisory Given, Missing   Pulmonary sleep apnea , Current Smoker and Patient abstained from smoking. RUL nodule    Pulmonary exam normal breath sounds clear to auscultation       Cardiovascular hypertension, Pt. on medications + CAD  Normal cardiovascular exam Rhythm:Regular Rate:Normal  Nuclear stress test 04/15/22: . Lexiscan  stress shows no EKG changes to suggest ischemia . Thinning with decreased tracer activity in the inferoseptal wall (base, mid, distal) and inferior wall (distal) Otherwise normal perfusion. No change in the recovery images . Overall consistent with probable soft tissue attenuation (diaphragm, bowel activity) No ischemia. Cannot exclude concomitant subendocardial scar . Left ventricular function is abnormal. Global function is mildly reduced at 49% with inferior hypokinesis Consider echocardiogram to further define regional wall motion and LV systolic function . Overall intermediate risk scan due to LVEF.    Neuro/Psych  Neuromuscular disease    GI/Hepatic negative GI ROS, Neg liver ROS,,,  Endo/Other  diabetes, Type 2, Oral Hypoglycemic Agents    Renal/GU negative Renal ROS     Musculoskeletal negative musculoskeletal ROS (+)    Abdominal   Peds  Hematology negative hematology ROS (+)   Anesthesia Other Findings   Reproductive/Obstetrics                             Anesthesia Physical Anesthesia Plan  ASA: 3  Anesthesia Plan: General   Post-op Pain Management: Tylenol  PO (pre-op)*   Induction: Intravenous  PONV Risk Score and Plan: 2 and Dexamethasone , Ondansetron , Propofol  infusion,  TIVA and Treatment may vary due to age or medical condition  Airway Management Planned: Oral ETT  Additional Equipment: None  Intra-op Plan:   Post-operative Plan: Extubation in OR  Informed Consent: I have reviewed the patients History and Physical, chart, labs and discussed the procedure including the risks, benefits and alternatives for the proposed anesthesia with the patient or authorized representative who has indicated his/her understanding and acceptance.     Dental advisory given  Plan Discussed with:   Anesthesia Plan Comments:         Anesthesia Quick Evaluation

## 2023-12-26 NOTE — Interval H&P Note (Signed)
 History and Physical Interval Note:  12/26/2023 7:18 AM  Gregory Jensen  has presented today for surgery, with the diagnosis of Left lower lobe nodule.  The various methods of treatment have been discussed with the patient and family. After consideration of risks, benefits and other options for treatment, the patient has consented to  Procedure(s): BRONCHOSCOPY, WITH BIOPSY USING ELECTROMAGNETIC NAVIGATION (Left) as a surgical intervention.  The patient's history has been reviewed, patient examined, no change in status, stable for surgery.  I have reviewed the patient's chart and labs.  Questions were answered to the patient's satisfaction.     Denson Flake

## 2023-12-26 NOTE — Anesthesia Postprocedure Evaluation (Signed)
 Anesthesia Post Note  Patient: Ary Bitter  Procedure(s) Performed: VIDEO BRONCHOSCOPY WITH ENDOBRONCHIAL NAVIGATION (Left) BRONCHOSCOPY, WITH BIOPSY BRONCHOSCOPY, WITH BRUSH BIOPSY BRONCHOSCOPY, WITH NEEDLE ASPIRATION BIOPSY INSERTION, FIDUCIAL MARKERS     Patient location during evaluation: PACU Anesthesia Type: General Level of consciousness: awake and alert Pain management: pain level controlled Vital Signs Assessment: post-procedure vital signs reviewed and stable Respiratory status: spontaneous breathing, nonlabored ventilation, respiratory function stable and patient connected to nasal cannula oxygen Cardiovascular status: blood pressure returned to baseline and stable Postop Assessment: no apparent nausea or vomiting Anesthetic complications: no  No notable events documented.  Last Vitals:  Vitals:   12/26/23 0900 12/26/23 0915  BP: 110/65 116/63  Pulse: 65 65  Resp: (!) 22 (!) 23  Temp:  37.1 C  SpO2: 94% 94%    Last Pain:  Vitals:   12/26/23 0915  PainSc: 0-No pain                 Melvenia Stabs

## 2023-12-26 NOTE — Transfer of Care (Signed)
 Immediate Anesthesia Transfer of Care Note  Patient: Gregory Jensen  Procedure(s) Performed: VIDEO BRONCHOSCOPY WITH ENDOBRONCHIAL NAVIGATION (Left) BRONCHOSCOPY, WITH BIOPSY BRONCHOSCOPY, WITH BRUSH BIOPSY BRONCHOSCOPY, WITH NEEDLE ASPIRATION BIOPSY INSERTION, FIDUCIAL MARKERS  Patient Location: PACU  Anesthesia Type:General  Level of Consciousness: drowsy and patient cooperative  Airway & Oxygen Therapy: Patient Spontanous Breathing  Post-op Assessment: Report given to RN and Post -op Vital signs reviewed and stable  Post vital signs: Reviewed and stable  Last Vitals:  Vitals Value Taken Time  BP 133/77 12/26/23 0841  Temp 37.1 C 12/26/23 0841  Pulse 79 12/26/23 0844  Resp 17 12/26/23 0844  SpO2 95 % 12/26/23 0844  Vitals shown include unfiled device data.  Last Pain:  Vitals:   12/26/23 0841  PainSc: 0-No pain         Complications: No notable events documented.

## 2023-12-26 NOTE — Anesthesia Procedure Notes (Signed)
 Procedure Name: Intubation Date/Time: 12/26/2023 7:34 AM  Performed by: Katrinka Parr, CRNAPre-anesthesia Checklist: Patient identified, Emergency Drugs available, Suction available and Patient being monitored Patient Re-evaluated:Patient Re-evaluated prior to induction Oxygen Delivery Method: Circle System Utilized Preoxygenation: Pre-oxygenation with 100% oxygen Induction Type: IV induction Ventilation: Mask ventilation without difficulty Laryngoscope Size: Mac and 4 Grade View: Grade II Tube type: Oral Tube size: 8.5 mm Number of attempts: 1 Airway Equipment and Method: Stylet Placement Confirmation: ETT inserted through vocal cords under direct vision, positive ETCO2 and breath sounds checked- equal and bilateral Secured at: 23 cm Tube secured with: Tape Dental Injury: Teeth and Oropharynx as per pre-operative assessment

## 2023-12-27 LAB — CYTOLOGY - NON PAP

## 2023-12-28 ENCOUNTER — Encounter (HOSPITAL_COMMUNITY): Payer: Self-pay | Admitting: Emergency Medicine

## 2023-12-29 ENCOUNTER — Other Ambulatory Visit (HOSPITAL_BASED_OUTPATIENT_CLINIC_OR_DEPARTMENT_OTHER): Payer: Self-pay | Admitting: Internal Medicine

## 2024-01-02 ENCOUNTER — Telehealth: Payer: Self-pay | Admitting: Internal Medicine

## 2024-01-02 ENCOUNTER — Other Ambulatory Visit (HOSPITAL_BASED_OUTPATIENT_CLINIC_OR_DEPARTMENT_OTHER): Payer: Self-pay | Admitting: *Deleted

## 2024-01-02 DIAGNOSIS — E781 Pure hyperglyceridemia: Secondary | ICD-10-CM

## 2024-01-02 DIAGNOSIS — E785 Hyperlipidemia, unspecified: Secondary | ICD-10-CM

## 2024-01-02 MED ORDER — FENOFIBRATE 145 MG PO TABS: 145.0000 mg | ORAL_TABLET | Freq: Every day | ORAL | 0 refills | Status: DC

## 2024-01-02 NOTE — Telephone Encounter (Signed)
 *  STAT* If patient is at the pharmacy, call can be transferred to refill team.   1. Which medications need to be refilled? (please list name of each medication and dose if known)   fenofibrate  (TRICOR ) 145 MG tablet   2. Which pharmacy/location (including street and city if local pharmacy) is medication to be sent to?  [id number] - [place], [place] - [address]    3. Do they need a 30 day or 90 day supply? 90   Has appt scheduled on 7/30 with Slater Duncan

## 2024-01-02 NOTE — Telephone Encounter (Signed)
  Patient has appt in July but his lipid panel lab order expires in June. Can new order be placed?

## 2024-01-02 NOTE — Telephone Encounter (Signed)
 Lab order updated.

## 2024-01-05 ENCOUNTER — Other Ambulatory Visit: Payer: Self-pay | Admitting: *Deleted

## 2024-01-05 NOTE — Progress Notes (Signed)
 The proposed treatment discussed in conference is for discussion purpose only and is not a binding recommendation.  The patients have not been physically examined, or presented with their treatment options.  Therefore, final treatment plans cannot be decided.

## 2024-01-06 ENCOUNTER — Ambulatory Visit: Admitting: Acute Care

## 2024-01-06 ENCOUNTER — Encounter: Payer: Self-pay | Admitting: Acute Care

## 2024-01-06 VITALS — BP 131/74 | HR 72 | Ht 68.0 in | Wt 185.4 lb

## 2024-01-06 DIAGNOSIS — Z85828 Personal history of other malignant neoplasm of skin: Secondary | ICD-10-CM | POA: Diagnosis not present

## 2024-01-06 DIAGNOSIS — Z8546 Personal history of malignant neoplasm of prostate: Secondary | ICD-10-CM

## 2024-01-06 DIAGNOSIS — F17200 Nicotine dependence, unspecified, uncomplicated: Secondary | ICD-10-CM | POA: Diagnosis not present

## 2024-01-06 DIAGNOSIS — C3432 Malignant neoplasm of lower lobe, left bronchus or lung: Secondary | ICD-10-CM

## 2024-01-06 DIAGNOSIS — Z9889 Other specified postprocedural states: Secondary | ICD-10-CM | POA: Diagnosis not present

## 2024-01-06 NOTE — Patient Instructions (Addendum)
 It is good to see you today. I am glad you have done well after your bronchoscopy and biopsy. Your biopsy results were positive for small cell lung cancer in the left lower lobe. I have referred you to radiation oncology You will get a phone call to get that appointment scheduled You already have follow-up with Dr. Liam Redhead on 5/27. I will order PFT's to evaluate if your lung function is adequate for surgical option as treatment. You will get a call to get this scheduled.  I have also referred you to Thoracic Surgery, Dr. Deloise Ferries,  for consult You will get  a call to get this scheduled.  Good luck with your treatment. Please call us  if you need us  for anything at all. Please contact office for sooner follow up if symptoms do not improve or worsen or seek emergency care

## 2024-01-06 NOTE — Progress Notes (Signed)
 Thoracic Location of Tumor / Histology: Left Lower Lobe Lung  Patient presented for surveillance imaging for lung cancer.  He was noted to have a nodule in his left lower lobe lung.  MRI Brain 01/17/2024  PET 11/03/2023:Hypermetabolic LEFT lower lobe pulmonary nodule is most consistent with bronchogenic carcinoma.  No evidence of metastatic adenopathy in the chest.  No evidence of distant metastatic disease.  Postsurgical change in the RIGHT upper lobe without evidence of local recurrence.  CT Chest 10/17/2023: 0.9 x 1.8 cm left lower lobe nodule, highly worrisome for disease recurrence.   Biopsies of Left Lower Lobe Lung 12/26/2023    Past/Anticipated interventions by pulmonary, if any:  Dara Ear NP 01/05/2024 -Cytology was positive for small cell carcinoma in the left lower lobe.  - This appears to be a new primary lung cancer.   -I have made referrals to radiation oncology.   -Patient already has follow-up with Dr. Liam Redhead on May 27 for medical oncology.  -I have referred back to thoracic surgery, Dr. Deloise Ferries who manage patient's earlier lobectomy, to be evaluated for surgery for this new finding.     Past/Anticipated interventions by cardiothoracic surgery, if any:  Dr. Deloise Ferries -RUL Lobectomy 05/10/2022  Past/Anticipated interventions by medical oncology, if any:  Dr. Marguerita Shih 01/10/2024   Tobacco/Marijuana/Snuff/ETOH use: Current Smoker  Signs/Symptoms Weight changes, if any: No Respiratory complaints, if any: No Hemoptysis, if any: Has non-productive dry cough, worse after bronchoscopy, now getting better.  Denies hemoptysis. Pain issues, if any:  Denies chest pain, pressure, or tightness. Has some neuropathy in his legs, has trouble walking longer distances.  SAFETY ISSUES: Prior radiation? No Pacemaker/ICD? No Possible current pregnancy? N/a Is the patient on methotrexate? No  Current Complaints / other details:   -Prostate Cancer (15 years ago) -Right upper  lobectomy (2 years ago) - Small cell cancer in left lower lobe (recent diagnosis) - Melanoma on back (recent diagnosis)

## 2024-01-06 NOTE — Progress Notes (Signed)
 History of Present Illness Gregory Jensen is a 68 y.o. male current every day smoker with with referred to see Dr. Baldwin Levee for concern for recurrent lung cancer noted on surveillance imaging 12/2023.   Significant PMH - Prostate cancer (15 years ago) - Right upper lobectomy (2 years ago) - Small cell cancer in left lower lobe (recent diagnosis) - Melanoma on back (recent diagnosis)  Synopsis 68 year old male with PMH of tobacco use (49 pack years).  Have seen him principally for abnormal chest imaging, 11 mm right upper lobe pulmonary nodule that was dye marked and then resected by Dr. Deloise Ferries 05/10/2022.  He is mixed obstruction and restriction on pulmonary function testing prior to that surgery, FEV1 75% predicted.  His surveillance has been with Dr. Marguerita Shih, CT chest and PET scan done in March 2025  He is not on any bronchodilator therapy currently.  PMH significant for diabetes, prostate cancer, hyperlipidemia and sleep apnea. He is smoking about 1 PPD of cigarettes.  CT scan of the chest done 10/17/2023 showed a 1.4 cm low-attenuation right thyroid  nodule, no mediastinal or hilar adenopathy, some emphysematous change, changes from his right upper lobe lobectomy.  There is an anterior lateral left lower lobe 18 x 9 mm nodule and a 5 mm medial left lower lobe nodule.   PET scan 11/03/2023  showed intense metabolic activity in the 21 x 9 mm left lower lobe pulmonary nodule.  No hypermetabolic nodes or nodules elsewhere.  No evidence of distant disease.  No evidence of local recurrence on the right.  Pt. Was seen by Dr. Baldwin Levee on 12/23/2023, they discussed the new rounded hypermetabolic left lower lobe pulmonary nodule in a patient with a history of non-small cell lung cancer. Suspicious for a new primary given the fact that there is no mediastinal adenopathy, no evidence of any other disease. Plan was for a biopsy with bronchoscopy on 12/26/2023, and for PFT's to assess his pulmonary function to  assess his degree of obstruction once the pulmonary nodule have been evaluated.Pt. is here today to review results of the cytology, and to ensure he has done well after the procedure.  01/06/2024 Pt. Presents for follow up. He states he has been doing well post bronchoscopy.  He denies hemoptysis, fever, discolored secretions, worsening dyspnea, or adverse reaction to anesthesia.  We have reviewed his cytology results.  Cytology was positive for small cell carcinoma in the left lower lobe.  Endobronchial biopsies in the right middle lobe were negative for malignant cells.  This appears to be a new primary lung cancer.  I have made referrals to radiation oncology.  Patient already has follow-up with Dr. Liam Redhead on May 27 for medical oncology.  We will order pulmonary function testing to see if patient is a surgical candidate.  I have referred back to thoracic surgery, Dr. Deloise Ferries who manage patient's earlier lobectomy, to be evaluated for surgery for this new finding.    Patient had an MRI brain in December 2024.  I will leave it up to Dr. Marguerita Shih to determine if we need to repeat this at this juncture with the new diagnosis.  Patient is in agreement with the plan above.  We have discussed smoking cessation as it is imperative that the patient quit smoking entirely.  Test Results: A. LUNG, RML ENDOBRONCHIAL, BIOPSY:  - No malignant cells identified  - Benign bronchial cells.   B. LUNG, RML ENDOBRONCHIAL, BRUSHING:  - No malignant cells identified  - Benign bronchial  cells.   SPECIMEN ADEQUACY:  A. Satisfactory for Evaluation  B. Satisfactory for Evaluation    C. LUNG, LLL, FINE NEEDLE ASPIRATION  BIOPSY:  - Small cell carcinoma  - See comment       Latest Ref Rng & Units 12/26/2023    6:00 AM 10/17/2023   12:32 PM 07/21/2023   10:45 AM  CBC  WBC 4.0 - 10.5 K/uL 9.7  9.3  9.3   Hemoglobin 13.0 - 17.0 g/dL 16.1  09.6  04.5   Hematocrit 39.0 - 52.0 % 43.0  41.4  43.5   Platelets 150  - 400 K/uL 202  202  207.0        Latest Ref Rng & Units 12/26/2023    6:00 AM 10/17/2023   12:32 PM 07/21/2023   10:45 AM  BMP  Glucose 70 - 99 mg/dL 409  811  914   BUN 8 - 23 mg/dL 13  19  21    Creatinine 0.61 - 1.24 mg/dL 7.82  9.56  2.13   Sodium 135 - 145 mmol/L 138  137  137   Potassium 3.5 - 5.1 mmol/L 3.9  3.8  3.9   Chloride 98 - 111 mmol/L 105  106  104   CO2 22 - 32 mmol/L 23  25  24    Calcium  8.9 - 10.3 mg/dL 9.3  9.9  9.9     BNP No results found for: "BNP"  ProBNP No results found for: "PROBNP"  PFT    Component Value Date/Time   FEV1PRE 2.37 03/26/2022 1443   FEV1POST 2.35 03/26/2022 1443   FVCPRE 3.21 03/26/2022 1443   FVCPOST 3.01 03/26/2022 1443   TLC 5.67 03/26/2022 1443   DLCOUNC 16.04 03/26/2022 1443   PREFEV1FVCRT 74 03/26/2022 1443   PSTFEV1FVCRT 78 03/26/2022 1443    DG Chest Port 1 View Result Date: 12/26/2023 CLINICAL DATA:  Status post bronchoscopy with biopsy.  Chest pain. EXAM: PORTABLE CHEST 1 VIEW COMPARISON:  June 25, 2022. FINDINGS: The heart size and mediastinal contours are within normal limits. Stable right apical pleural thickening or scarring. No acute pulmonary disease is noted. The visualized skeletal structures are unremarkable. IMPRESSION: No active disease. Electronically Signed   By: Rosalene Colon M.D.   On: 12/26/2023 10:16   DG C-ARM BRONCHOSCOPY Result Date: 12/26/2023 C-ARM BRONCHOSCOPY: Fluoroscopy was utilized by the requesting physician.  No radiographic interpretation.     Past medical hx Past Medical History:  Diagnosis Date   Cancer (HCC) 4 years ago   Prostate Cancer , Lung Cancer   Diabetes mellitus without complication (HCC)    type 2   Hyperlipidemia    Hypertension    Neuromuscular disorder (HCC)    neuropathy   Neuropathy    in legs causing balance issues per patient   Sleep apnea    Smoker      Social History   Tobacco Use   Smoking status: Every Day    Current packs/day: 1.00     Average packs/day: 1 pack/day for 47.0 years (47.0 ttl pk-yrs)    Types: Cigarettes   Smokeless tobacco: Never   Tobacco comments:    1 pack of cigarettes smoked daily 12/23/2023  Vaping Use   Vaping status: Never Used  Substance Use Topics   Alcohol use: Not Currently   Drug use: Never    Gregory Jensen reports that he has been smoking cigarettes. He has a 47 pack-year smoking history. He has never used smokeless tobacco. He reports  that he does not currently use alcohol. He reports that he does not use drugs.  Tobacco Cessation: Ready to quit: Not Answered Counseling given: Not Answered Tobacco comments: 1 pack of cigarettes smoked daily 12/23/2023 Current everyday smoker, he has been counseled to quit  Past surgical hx, Family hx, Social hx all reviewed.  Current Outpatient Medications on File Prior to Visit  Medication Sig   aspirin  81 MG EC tablet Take 81 mg by mouth in the morning.   atorvastatin  (LIPITOR) 40 MG tablet TAKE 1 TABLET BY MOUTH EVERY DAY   Blood Glucose Monitoring Suppl (TRUE METRIX METER) w/Device KIT USE AS DIRECTED   fenofibrate  (TRICOR ) 145 MG tablet Take 1 tablet (145 mg total) by mouth daily.   glucose blood (ONETOUCH VERIO) test strip 1 each by Other route daily as needed for other. Use as instructed   ibuprofen (ADVIL) 200 MG tablet Take 400-600 mg by mouth daily as needed (pain.).   losartan  (COZAAR ) 50 MG tablet TAKE 1 TABLET BY MOUTH EVERY DAY   metFORMIN  (GLUCOPHAGE ) 500 MG tablet TAKE 1 TABLET BY MOUTH TWICE A DAY WITH FOOD   OneTouch Delica Lancets 33G MISC 1 each by Does not apply route daily as needed.   No current facility-administered medications on file prior to visit.     Allergies  Allergen Reactions   Jardiance  [Empagliflozin ] Other (See Comments)    Candidal balanitis    Review Of Systems:  Constitutional:   No  weight loss, night sweats,  Fevers, chills, fatigue, or  lassitude.  HEENT:   No headaches,  Difficulty swallowing,   Tooth/dental problems, or  Sore throat,                No sneezing, itching, ear ache, nasal congestion, post nasal drip,   CV:  No chest pain,  Orthopnea, PND, swelling in lower extremities, anasarca, dizziness, palpitations, syncope.   GI  No heartburn, indigestion, abdominal pain, nausea, vomiting, diarrhea, change in bowel habits, loss of appetite, bloody stools.   Resp: No shortness of breath with exertion or at rest.  No excess mucus, no productive cough,  No non-productive cough,  No coughing up of blood.  No change in color of mucus.  No wheezing.  No chest wall deformity  Skin: no rash or lesions.  GU: no dysuria, change in color of urine, no urgency or frequency.  No flank pain, no hematuria   MS:  No joint pain or swelling.  No decreased range of motion.  No back pain.  Psych:  No change in mood or affect. No depression or anxiety.  No memory loss.   Vital Signs BP 131/74 (BP Location: Left Arm, Cuff Size: Large)   Pulse 72   Ht 5\' 8"  (1.727 m)   Wt 185 lb 6.4 oz (84.1 kg)   SpO2 97%   BMI 28.19 kg/m    Physical Exam:  General- No distress,  A&Ox3, pleasant and appropriate ENT: No sinus tenderness, TM clear, pale nasal mucosa, no oral exudate,no post nasal drip, no LAN Cardiac: S1, S2, regular rate and rhythm, no murmur Chest: No wheeze/ rales/ dullness; no accessory muscle use, no nasal flaring, no sternal retractions, slightly diminished per bases Abd.: Soft Non-tender, nondistended, bowel sounds positive,Body mass index is 28.19 kg/m.  Ext: No clubbing cyanosis, edema, no obvious deformities Neuro:  normal strength, moving all extremities x 4, alert and oriented x 3, appropriate Skin: No rashes, warm and dry, no obvious skin lesions Psych: normal  mood and behavior   Assessment/Plan New diagnosis small cell cancer in the left lower lobe History of adenocarcinoma in the right upper lobe Current everyday smoker History of prostate cancer History of skin  cancer/melanoma Plan I am glad you have done well after your bronchoscopy and biopsy. Your biopsy results were positive for small cell lung cancer in the left lower lobe. I have referred you to radiation oncology You will get a phone call to get that appointment scheduled You already have follow-up with Dr. Liam Redhead on 5/27. I will order PFT's to evaluate if your lung function is adequate for surgical option as treatment. You will get a call to get this scheduled.  I have also referred you to Thoracic Surgery, Dr. Deloise Ferries,  for consult You will get  a call to get this scheduled.  Good luck with your treatment. Please call us  if you need us  for anything at all. Please contact office for sooner follow up if symptoms do not improve or worsen or seek emergency care    I spent 45 minutes dedicated to the care of this patient on the date of this encounter to include pre-visit review of records, face-to-face time with the patient discussing conditions above, post visit ordering of testing, clinical documentation with the electronic health record, making appropriate referrals as documented, and communicating necessary information to the patient's healthcare team.      Raejean Bullock, NP 01/06/2024  8:40 AM

## 2024-01-10 ENCOUNTER — Telehealth: Payer: Self-pay | Admitting: Acute Care

## 2024-01-10 ENCOUNTER — Inpatient Hospital Stay: Attending: Internal Medicine

## 2024-01-10 ENCOUNTER — Ambulatory Visit
Admission: RE | Admit: 2024-01-10 | Discharge: 2024-01-10 | Disposition: A | Discharge status: Home / Self Care | Source: Ambulatory Visit | Attending: Radiation Oncology | Admitting: Radiation Oncology

## 2024-01-10 ENCOUNTER — Other Ambulatory Visit: Payer: Self-pay | Admitting: *Deleted

## 2024-01-10 ENCOUNTER — Encounter: Payer: Self-pay | Admitting: Radiation Oncology

## 2024-01-10 ENCOUNTER — Inpatient Hospital Stay: Admitting: Internal Medicine

## 2024-01-10 VITALS — BP 149/61 | HR 79 | Temp 98.1°F | Resp 17 | Ht 68.0 in | Wt 186.2 lb

## 2024-01-10 VITALS — BP 153/79 | HR 78 | Temp 97.5°F | Resp 18 | Ht 68.0 in | Wt 186.2 lb

## 2024-01-10 DIAGNOSIS — Z85118 Personal history of other malignant neoplasm of bronchus and lung: Secondary | ICD-10-CM | POA: Diagnosis not present

## 2024-01-10 DIAGNOSIS — Z9221 Personal history of antineoplastic chemotherapy: Secondary | ICD-10-CM | POA: Insufficient documentation

## 2024-01-10 DIAGNOSIS — Z800 Family history of malignant neoplasm of digestive organs: Secondary | ICD-10-CM | POA: Insufficient documentation

## 2024-01-10 DIAGNOSIS — Z923 Personal history of irradiation: Secondary | ICD-10-CM | POA: Insufficient documentation

## 2024-01-10 DIAGNOSIS — Z79899 Other long term (current) drug therapy: Secondary | ICD-10-CM | POA: Insufficient documentation

## 2024-01-10 DIAGNOSIS — G4730 Sleep apnea, unspecified: Secondary | ICD-10-CM | POA: Insufficient documentation

## 2024-01-10 DIAGNOSIS — I10 Essential (primary) hypertension: Secondary | ICD-10-CM | POA: Insufficient documentation

## 2024-01-10 DIAGNOSIS — C3432 Malignant neoplasm of lower lobe, left bronchus or lung: Secondary | ICD-10-CM

## 2024-01-10 DIAGNOSIS — Z7984 Long term (current) use of oral hypoglycemic drugs: Secondary | ICD-10-CM | POA: Insufficient documentation

## 2024-01-10 DIAGNOSIS — E785 Hyperlipidemia, unspecified: Secondary | ICD-10-CM | POA: Insufficient documentation

## 2024-01-10 DIAGNOSIS — C3411 Malignant neoplasm of upper lobe, right bronchus or lung: Secondary | ICD-10-CM

## 2024-01-10 DIAGNOSIS — C3490 Malignant neoplasm of unspecified part of unspecified bronchus or lung: Secondary | ICD-10-CM | POA: Diagnosis not present

## 2024-01-10 DIAGNOSIS — G629 Polyneuropathy, unspecified: Secondary | ICD-10-CM | POA: Insufficient documentation

## 2024-01-10 DIAGNOSIS — R911 Solitary pulmonary nodule: Secondary | ICD-10-CM | POA: Diagnosis present

## 2024-01-10 DIAGNOSIS — F17210 Nicotine dependence, cigarettes, uncomplicated: Secondary | ICD-10-CM | POA: Diagnosis not present

## 2024-01-10 DIAGNOSIS — E119 Type 2 diabetes mellitus without complications: Secondary | ICD-10-CM | POA: Insufficient documentation

## 2024-01-10 HISTORY — DX: Malignant neoplasm of unspecified part of unspecified bronchus or lung: C34.90

## 2024-01-10 LAB — CMP (CANCER CENTER ONLY)
ALT: 17 U/L (ref 0–44)
AST: 17 U/L (ref 15–41)
Albumin: 4.8 g/dL (ref 3.5–5.0)
Alkaline Phosphatase: 69 U/L (ref 38–126)
Anion gap: 9 (ref 5–15)
BUN: 16 mg/dL (ref 8–23)
CO2: 26 mmol/L (ref 22–32)
Calcium: 9.7 mg/dL (ref 8.9–10.3)
Chloride: 103 mmol/L (ref 98–111)
Creatinine: 1.07 mg/dL (ref 0.61–1.24)
GFR, Estimated: >60 mL/min (ref >60)
Glucose, Bld: 200 mg/dL — ABNORMAL HIGH (ref 70–99)
Potassium: 3.7 mmol/L (ref 3.5–5.1)
Sodium: 138 mmol/L (ref 135–145)
Total Bilirubin: 0.4 mg/dL (ref 0.0–1.2)
Total Protein: 7.5 g/dL (ref 6.5–8.1)

## 2024-01-10 LAB — CBC WITH DIFFERENTIAL (CANCER CENTER ONLY)
Abs Immature Granulocytes: 0.04 10*3/uL (ref 0.00–0.07)
Basophils Absolute: 0.1 10*3/uL (ref 0.0–0.1)
Basophils Relative: 1 %
Eosinophils Absolute: 0.1 10*3/uL (ref 0.0–0.5)
Eosinophils Relative: 1 %
HCT: 41.7 % (ref 39.0–52.0)
Hemoglobin: 14.8 g/dL (ref 13.0–17.0)
Immature Granulocytes: 1 %
Lymphocytes Relative: 24 %
Lymphs Abs: 2 10*3/uL (ref 0.7–4.0)
MCH: 32.3 pg (ref 26.0–34.0)
MCHC: 35.5 g/dL (ref 30.0–36.0)
MCV: 91 fL (ref 80.0–100.0)
Monocytes Absolute: 0.5 10*3/uL (ref 0.1–1.0)
Monocytes Relative: 6 %
Neutro Abs: 5.8 10*3/uL (ref 1.7–7.7)
Neutrophils Relative %: 67 %
Platelet Count: 201 10*3/uL (ref 150–400)
RBC: 4.58 MIL/uL (ref 4.22–5.81)
RDW: 12.4 % (ref 11.5–15.5)
WBC Count: 8.6 10*3/uL (ref 4.0–10.5)
nRBC: 0 % (ref 0.0–0.2)

## 2024-01-10 NOTE — Progress Notes (Signed)
 [facility] [phone]   [phone]  OFFICE PROGRESS NOTE  Rodney Clamp, MD [address] [place] [address]  DIAGNOSIS:   1) stage IA (T1c, N0, M0) small cell lung cancer presented with left lower lobe lung nodule diagnosed in May 2025.Gregory Jensen 2) Stage IA (T1c, N0, M0) non-small cell lung cancer, squamous cell carcinoma presented with right upper lobe in July 2023   PRIOR THERAPY: Status post right upper lobectomy with lymph node sampling under the care of Dr. Deloise Ferries on 05/10/2022 with tumor size of 2.3 cm.   CURRENT THERAPY: Systemic chemotherapy with carboplatin for AUC of 5 on day 1 and oh etoposide 100 Mg/M2 on days 1, 2 and 3 every 3 weeks.  First dose 01/23/2024.  This is concurrent with SBRT to the left lower lobe lung nodule under the care of Dr. Jeryl Moris.  INTERVAL HISTORY: Gregory Jensen 68 y.o. male returns to the clinic today for follow-up visit. Discussed the use of AI scribe software for clinical note transcription with the patient, who gave verbal consent to proceed.  History of Present Illness   Gregory Jensen is a 68 year old male with stage 1A small cell lung cancer who presents for evaluation and discussion of treatment options. He was referred by Dr. Baldwin Levee, a pulmonologist, for further evaluation and treatment of small cell lung cancer.  In May 2025, he was diagnosed with stage 1A small cell lung cancer, presenting with a left lower lobe lung nodule. A PET scan identified the nodule, and a subsequent bronchoscopy and biopsy confirmed the diagnosis. He is here to discuss treatment options for this condition.  He has a history of stage 1A non-small cell lung cancer, specifically squamous cell carcinoma, which presented with a right upper lobe nodule in July 2023. He underwent a right upper lobectomy with lymph node dissection and has been under observation since then.  He is scheduled to have a normal mole removed from his  back next Monday.  No other complaints or symptoms at this time.        MEDICAL HISTORY: Past Medical History:  Diagnosis Date   Cancer (HCC) 4 years ago   Prostate Cancer , Lung Cancer   Diabetes mellitus without complication (HCC)    type 2   Hyperlipidemia    Hypertension    Neuromuscular disorder (HCC)    neuropathy   Neuropathy    in legs causing balance issues per patient   Sleep apnea    Smoker     ALLERGIES:  is allergic to jardiance  [empagliflozin ].  MEDICATIONS:  Current Outpatient Medications  Medication Sig Dispense Refill   aspirin  81 MG EC tablet Take 81 mg by mouth in the morning.     atorvastatin  (LIPITOR) 40 MG tablet TAKE 1 TABLET BY MOUTH EVERY DAY 90 tablet 0   Blood Glucose Monitoring Suppl (TRUE METRIX METER) w/Device KIT USE AS DIRECTED 1 kit 0   fenofibrate  (TRICOR ) 145 MG tablet Take 1 tablet (145 mg total) by mouth daily. 90 tablet 0   glucose blood (ONETOUCH VERIO) test strip 1 each by Other route daily as needed for other. Use as instructed 100 each 4   ibuprofen (ADVIL) 200 MG tablet Take 400-600 mg by mouth daily as needed (pain.).     losartan  (COZAAR ) 50 MG tablet TAKE 1 TABLET BY MOUTH EVERY DAY 90 tablet 1   metFORMIN  (GLUCOPHAGE ) 500 MG tablet TAKE 1 TABLET BY MOUTH TWICE  A DAY WITH FOOD 180 tablet 1   OneTouch Delica Lancets 33G MISC 1 each by Does not apply route daily as needed. 100 each 4   No current facility-administered medications for this visit.    SURGICAL HISTORY:  Past Surgical History:  Procedure Laterality Date   BACK SURGERY  15 years ago   BRONCHIAL BIOPSY  05/10/2022   Procedure: BRONCHIAL BIOPSIES;  Surgeon: Denson Flake, MD;  Location: [facility] ENDOSCOPY;  Service: Pulmonary;;   BRONCHIAL BIOPSY  12/26/2023   Procedure: BRONCHOSCOPY, WITH BIOPSY;  Surgeon: Denson Flake, MD;  Location: [facility] ENDOSCOPY;  Service: Pulmonary;;   BRONCHIAL BRUSHINGS  05/10/2022   Procedure: BRONCHIAL BRUSHINGS;  Surgeon: Denson Flake,  MD;  [facility] ENDOSCOPY;  Service: Pulmonary;;   BRONCHIAL BRUSHINGS  12/26/2023   Procedure: BRONCHOSCOPY, WITH BRUSH BIOPSY;  Surgeon: Denson Flake, MD;  Location: [facility] ENDOSCOPY;  Service: Pulmonary;;   BRONCHIAL NEEDLE ASPIRATION BIOPSY  05/10/2022   Procedure: BRONCHIAL NEEDLE ASPIRATION BIOPSIES;  Surgeon: Denson Flake, MD;  Location: [facility] ENDOSCOPY;  Service: Pulmonary;;   BRONCHIAL NEEDLE ASPIRATION BIOPSY  12/26/2023   Procedure: BRONCHOSCOPY, WITH NEEDLE ASPIRATION BIOPSY;  Surgeon: Denson Flake, MD;  Location: [facility] ENDOSCOPY;  Service: Pulmonary;;   COLONOSCOPY  2007   pt does not know MD name/normal exam per pt.   FIDUCIAL MARKER PLACEMENT  05/10/2022   Procedure: FIDUCIAL DYE MARKING;  Surgeon: Denson Flake, MD;  Location[facility] ENDOSCOPY;  Service: Pulmonary;;   FIDUCIAL MARKER PLACEMENT  12/26/2023   Procedure: INSERTION, FIDUCIAL MARKERS;  Surgeon: Denson Flake, MD;  Location: [facility] ENDOSCOPY;  Service: Pulmonary;;   INTERCOSTAL NERVE BLOCK Right 05/10/2022   Procedure: INTERCOSTAL NERVE BLOCK;  Surgeon: Hilarie Lovely, MD;  Location: [facility] OR;  Service: Thoracic;  Laterality: Right;   NODE DISSECTION Right 05/10/2022   Procedure: NODE DISSECTION;  Surgeon: Hilarie Lovely, MD;  Location: [facility] OR;  Service: Thoracic;  Laterality: Right;   PROSTATECTOMY  4 years ago   VIDEO BRONCHOSCOPY WITH ENDOBRONCHIAL NAVIGATION Left 12/26/2023   Procedure: VIDEO BRONCHOSCOPY WITH ENDOBRONCHIAL NAVIGATION;  Surgeon: Denson Flake, MD;  Location: [facility] ENDOSCOPY;  Service: Pulmonary;  Laterality: Left;    REVIEW OF SYSTEMS:  Constitutional: positive for fatigue Eyes: negative Ears, nose, mouth, throat, and face: negative Respiratory: negative Cardiovascular: negative Gastrointestinal: negative Genitourinary:negative Integument/breast: negative Hematologic/lymphatic: negative Musculoskeletal:negative Neurological: negative Behavioral/Psych: negative Endocrine:  negative Allergic/Immunologic: negative   PHYSICAL EXAMINATION: General appearance: alert, cooperative, and no distress Head: Normocephalic, without obvious abnormality, atraumatic Neck: no adenopathy, no JVD, supple, symmetrical, trachea midline, and thyroid  not enlarged, symmetric, no tenderness/mass/nodules Lymph nodes: Cervical, supraclavicular, and axillary nodes normal. Resp: clear to auscultation bilaterally Back: symmetric, no curvature. ROM normal. No CVA tenderness. Cardio: regular rate and rhythm, S1, S2 normal, no murmur, click, rub or gallop GI: soft, non-tender; bowel sounds normal; no masses,  no organomegaly Extremities: extremities normal, atraumatic, no cyanosis or edema Neurologic: Alert and oriented X 3, normal strength and tone. Normal symmetric reflexes. Normal coordination and gait  ECOG PERFORMANCE STATUS: 1 - Symptomatic but completely ambulatory  Blood pressure (!) 149/61, pulse 79, temperature 98.1 F (36.7 C), resp. rate 17, height 5\' 8"  (1.727 m), weight 186 lb 3.2 oz (84.5 kg), SpO2 98%.  LABORATORY DATA: Lab Results  Component Value Date   WBC 8.6 01/10/2024   HGB 14.8 01/10/2024   HCT 41.7 01/10/2024   MCV 91.0 01/10/2024   PLT 201 01/10/2024  Chemistry      Component Value Date/Time   NA 138 12/26/2023 0600   NA 138 04/27/2022 0847   K 3.9 12/26/2023 0600   CL 105 12/26/2023 0600   CO2 23 12/26/2023 0600   BUN 13 12/26/2023 0600   BUN 19 04/27/2022 0847   CREATININE 1.15 12/26/2023 0600   CREATININE 1.05 10/17/2023 1232   CREATININE 0.97 07/23/2020 0827      Component Value Date/Time   CALCIUM  9.3 12/26/2023 0600   ALKPHOS 59 10/17/2023 1232   AST 20 10/17/2023 1232   ALT 21 10/17/2023 1232   BILITOT 0.4 10/17/2023 1232       RADIOGRAPHIC STUDIES: DG Chest Port 1 View Result Date: 12/26/2023 CLINICAL DATA:  Status post bronchoscopy with biopsy.  Chest pain. EXAM: PORTABLE CHEST 1 VIEW COMPARISON:  June 25, 2022. FINDINGS:  The heart size and mediastinal contours are within normal limits. Stable right apical pleural thickening or scarring. No acute pulmonary disease is noted. The visualized skeletal structures are unremarkable. IMPRESSION: No active disease. Electronically Signed   By: Rosalene Colon M.D.   On: 12/26/2023 10:16   DG C-ARM BRONCHOSCOPY Result Date: 12/26/2023 C-ARM BRONCHOSCOPY: Fluoroscopy was utilized by the requesting physician.  No radiographic interpretation.    ASSESSMENT AND PLAN: This is a very pleasant 68 years old white male with  1) stage Ia (T1c, N0, M0) small cell lung cancer presented with left lower lobe lung nodule diagnosed in May 2025. 2) History of stage IA (T1c, N0, M0) non-small cell lung cancer, squamous cell carcinoma presented with right upper lobe in July 2023 status post right upper lobectomy with lymph node sampling under the care of Dr. Deloise Ferries on 05/10/2022 with tumor size of 2.3 cm.  The patient is currently on observation He had repeat PET scan performed recently that showed hypermetabolic left lower lobe lung nodule with no other evidence of metastatic disease to the mediastinal lymph node or distant metastasis. He had repeat bronchoscopy with biopsy of the left lower lobe lung nodule and unfortunately the final pathology was consistent with a small cell carcinoma. I had a lengthy discussion with the patient today about his current condition and treatment options. He agreed and expected to start systemic chemotherapy with carboplatin for AUC of 5 on day 1 and etoposide 100 Mg/M2 on days 1, 2 and 3 every 3 weeks for 4 cycles concurrent with SBRT to the left lower lobe lung nodule. Assessment and Plan    Stage 1A small cell lung cancer Stage 1A (T1c, N0, M0) small cell lung cancer diagnosed in May 2025, presenting as a left lower lobe lung nodule. Despite its early stage, treatment is necessary due to its aggressive nature and potential for rapid spread. The treatment  plan includes stereotactic body radiotherapy (SBRT) followed by chemotherapy with carboplatin and etoposide. Chemotherapy is essential to prevent metastasis, with potential side effects including alopecia, nausea, emesis, myelosuppression, fatigue, and possible nephrotoxicity or hepatotoxicity. He expressed concern about chemotherapy but agreed to proceed after discussing the risks and benefits, including a favorable prognosis for cure with the proposed treatment plan. Surgery was considered but not advisable due to previous right upper lobectomy. - Refer to radiation oncologist for SBRT - Administer chemotherapy with carboplatin and etoposide for four cycles, three days in a row every three weeks - Monitor blood counts, renal, and hepatic function during chemotherapy - Prescribe antiemetic medication - Order MRI of the brain to rule out metastasis - Schedule chemotherapy to start  post-radiation therapy  Stage 1A non-small cell lung cancer Stage 1A non-small cell lung cancer, squamous cell carcinoma, with a right upper lobe nodule diagnosed in July 2023. Status post right upper lobectomy with lymph node dissection. Currently under observation with no indication of recurrence.     The patient was advised to call immediately if he has any concerning symptoms in the interval.  The patient voices understanding of current disease status and treatment options and is in agreement with the current care plan.  All questions were answered. The patient knows to call the clinic with any problems, questions or concerns. We can certainly see the patient much sooner if necessary.  The total time spent in the appointment was 30 minutes.  Disclaimer: This note was dictated with voice recognition software. Similar sounding words can inadvertently be transcribed and may not be corrected upon review.

## 2024-01-10 NOTE — Progress Notes (Signed)
 Radiation Oncology         [phone] ________________________________  Name: Gregory Jensen        MRN: [id number]  Date of Service: 01/10/2024 DOB: January 23, 1956  NU:[id number], Jinny Mounts, MD  Denson Flake, MD     REFERRING PHYSICIAN: Denson Flake, MD   DIAGNOSIS: The primary encounter diagnosis was Primary small cell carcinoma of lower lobe of left lung (HCC). A diagnosis of Malignant neoplasm of upper lobe of right lung [facility]) was also pertinent to this visit.   HISTORY OF PRESENT ILLNESS: Gregory Jensen is a 68 y.o. male seen at the request of Dr. Marguerita Shih for a newly diagnosed small cell lung cancer with a history of Stage IA3, cT1cNM0, NSCLC, Squamous cell carcinoma of the RUL. The patient was diagnosed in July 2023, and he was treated with right upper lobectomy with Dr. Deloise Ferries on 05/10/22. He was in observation until a CT on 10/17/23 that showed stability in the RUL but what had been a 5 mm nodule in the LLL in September 2024 CT was now measuring up to 1.8 cm. A PET scan on 11/03/23 showed hypermetabolic uptake in the LLL nodule measuring 2.1 cm, with an SUV of 9.6. No other hypermetabolic uptake was noted. He underwent a bronchoscopy on 12/26/23 and RML endobronchial biopsy and brushing were negative for malignancy, but the LLL FNA showed small cell carcinoma.   He met with Dr. Marguerita Shih and was offered SBRT with chemotherapy to follow. He's seen today to discuss treatment recommendations of his cancer.     PREVIOUS RADIATION THERAPY: No   PAST MEDICAL HISTORY:  Past Medical History:  Diagnosis Date   Cancer (HCC) 4 years ago   Prostate Cancer , Lung Cancer   Diabetes mellitus without complication (HCC)    type 2   Hyperlipidemia    Hypertension    Lung cancer (HCC)    Left Lower Lobe Lung   Neuromuscular disorder (HCC)    neuropathy   Neuropathy    in legs causing balance issues per patient   Sleep apnea    Smoker        PAST SURGICAL HISTORY: Past Surgical  History:  Procedure Laterality Date   BACK SURGERY  15 years ago   BRONCHIAL BIOPSY  05/10/2022   Procedure: BRONCHIAL BIOPSIES;  Surgeon: Denson Flake, MD;  Location: [facility] ENDOSCOPY;  Service: Pulmonary;;   BRONCHIAL BIOPSY  12/26/2023   Procedure: BRONCHOSCOPY, WITH BIOPSY;  Surgeon: Denson Flake, MD;  Location: [facility] ENDOSCOPY;  Service: Pulmonary;;   BRONCHIAL BRUSHINGS  05/10/2022   Procedure: BRONCHIAL BRUSHINGS;  Surgeon: Denson Flake, MD;  Location: [facility] ENDOSCOPY;  Service: Pulmonary;;   BRONCHIAL BRUSHINGS  12/26/2023   Procedure: BRONCHOSCOPY, WITH BRUSH BIOPSY;  Surgeon: Denson Flake, MD;  Location: [facility] ENDOSCOPY;  Service: Pulmonary;;   BRONCHIAL NEEDLE ASPIRATION BIOPSY  05/10/2022   Procedure: BRONCHIAL NEEDLE ASPIRATION BIOPSIES;  Surgeon: Denson Flake, MD;  Location: [facility] ENDOSCOPY;  Service: Pulmonary;;   BRONCHIAL NEEDLE ASPIRATION BIOPSY  12/26/2023   Procedure: BRONCHOSCOPY, WITH NEEDLE ASPIRATION BIOPSY;  Surgeon: Denson Flake, MD;  Location: [facility] ENDOSCOPY;  Service: Pulmonary;;   COLONOSCOPY  2007   pt does not know MD name/normal exam per pt.   FIDUCIAL MARKER PLACEMENT  05/10/2022   Procedure: FIDUCIAL DYE MARKING;  Surgeon: Denson Flake, MD;  Location: [facility] ENDOSCOPY;  Service: Pulmonary;;   FIDUCIAL MARKER PLACEMENT  12/26/2023   Procedure: INSERTION, FIDUCIAL MARKERS;  Surgeon: Denson Flake, MD;  Location: [facility] ENDOSCOPY;  Service: Pulmonary;;   INTERCOSTAL NERVE BLOCK Right 05/10/2022   Procedure: INTERCOSTAL NERVE BLOCK;  Surgeon: Hilarie Lovely, MD;  Location[facility] OR;  Service: Thoracic;  Laterality: Right;   NODE DISSECTION Right 05/10/2022   Procedure: NODE DISSECTION;  Surgeon: Hilarie Lovely, MD;  Location: [facility] OR;  Service: Thoracic;  Laterality: Right;   PROSTATECTOMY  4 years ago   VIDEO BRONCHOSCOPY WITH ENDOBRONCHIAL NAVIGATION Left 12/26/2023   Procedure: VIDEO BRONCHOSCOPY WITH ENDOBRONCHIAL NAVIGATION;  Surgeon: Denson Flake, MD;   Location: [facility] ENDOSCOPY;  Service: Pulmonary;  Laterality: Left;     FAMILY HISTORY:  Family History  Problem Relation Age of Onset   Alzheimer's disease Mother    Heart disease Father    Hypertension Sister    Heart disease Sister    Hypertension Sister    Stomach cancer Brother    Diabetes Other        family hx   Colon cancer Neg Hx    Colon polyps Neg Hx    Esophageal cancer Neg Hx    Rectal cancer Neg Hx      SOCIAL HISTORY:  reports that he has been smoking cigarettes. He has a 47 pack-year smoking history. He has never used smokeless tobacco. He reports that he does not currently use alcohol. He reports that he does not use drugs. The patient is in a relationship. He lives in [place] and is retired from working in a maintenance position. He has an adult son who lives in [place].    ALLERGIES: Jardiance  [empagliflozin ]   MEDICATIONS:  Current Outpatient Medications  Medication Sig Dispense Refill   aspirin  81 MG EC tablet Take 81 mg by mouth in the morning.     atorvastatin  (LIPITOR) 40 MG tablet TAKE 1 TABLET BY MOUTH EVERY DAY 90 tablet 0   Blood Glucose Monitoring Suppl (TRUE METRIX METER) w/Device KIT USE AS DIRECTED 1 kit 0   cyanocobalamin  1000 MCG tablet Take 1,000 mcg by mouth daily.     fenofibrate  (TRICOR ) 145 MG tablet Take 1 tablet (145 mg total) by mouth daily. 90 tablet 0   glucose blood (ONETOUCH VERIO) test strip 1 each by Other route daily as needed for other. Use as instructed 100 each 4   ibuprofen (ADVIL) 200 MG tablet Take 400-600 mg by mouth daily as needed (pain.).     losartan  (COZAAR ) 50 MG tablet TAKE 1 TABLET BY MOUTH EVERY DAY 90 tablet 1   metFORMIN  (GLUCOPHAGE ) 500 MG tablet TAKE 1 TABLET BY MOUTH TWICE A DAY WITH FOOD 180 tablet 1   OneTouch Delica Lancets 33G MISC 1 each by Does not apply route daily as needed. 100 each 4   No current facility-administered medications for this encounter.     REVIEW OF SYSTEMS: On review of  systems, the patient reports that he is doing well. He is not short of breath and is able to get around without difficulty. He does have neuropathy in his legs and uses a cain when going out for long distances. HE denise chest pain, shortness of breath or unintended weight loss. He has had an occasional dry cough since bronchoscopy without hemoptysis.  No other complaints are verbalized.      PHYSICAL EXAM:  Wt Readings from Last 3 Encounters:  01/10/24 186 lb 3.2 oz (84.5 kg)  01/10/24 186 lb 3.2 oz (84.5 kg)  01/06/24 185 lb 6.4 oz (84.1 kg)  Temp Readings from Last 3 Encounters:  01/10/24 (!) 97.5 F (36.4 C) (Temporal)  01/10/24 98.1 F (36.7 C)  12/26/23 98.7 F (37.1 C)   BP Readings from Last 3 Encounters:  01/10/24 (!) 153/79  01/10/24 (!) 149/61  01/06/24 131/74   Pulse Readings from Last 3 Encounters:  01/10/24 78  01/10/24 79  01/06/24 72   Pain Assessment Pain Score: 0-No pain/10  In general this is a well appearing caucasian male in no acute distress. He's alert and oriented x4 and appropriate throughout the examination. Cardiopulmonary assessment is negative for acute distress and he exhibits normal effort.     ECOG = 1  0 - Asymptomatic (Fully active, able to carry on all predisease activities without restriction)  1 - Symptomatic but completely ambulatory (Restricted in physically strenuous activity but ambulatory and able to carry out work of a light or sedentary nature. For example, light housework, office work)  2 - Symptomatic, <50% in bed during the day (Ambulatory and capable of all self care but unable to carry out any work activities. Up and about more than 50% of waking hours)  3 - Symptomatic, >50% in bed, but not bedbound (Capable of only limited self-care, confined to bed or chair 50% or more of waking hours)  4 - Bedbound (Completely disabled. Cannot carry on any self-care. Totally confined to bed or chair)  5 - Death   Aurea Blossom MM, Creech  RH, Tormey DC, et al. [phone]). "Toxicity and response criteria of the [facility] Group". Am. Hillard Lowes. Oncol. 5 (6): 649-55    LABORATORY DATA:  Lab Results  Component Value Date   WBC 8.6 01/10/2024   HGB 14.8 01/10/2024   HCT 41.7 01/10/2024   MCV 91.0 01/10/2024   PLT 201 01/10/2024   Lab Results  Component Value Date   NA 138 01/10/2024   K 3.7 01/10/2024   CL 103 01/10/2024   CO2 26 01/10/2024   Lab Results  Component Value Date   ALT 17 01/10/2024   AST 17 01/10/2024   ALKPHOS 69 01/10/2024   BILITOT 0.4 01/10/2024      RADIOGRAPHY: DG Chest Port 1 View Result Date: 12/26/2023 CLINICAL DATA:  Status post bronchoscopy with biopsy.  Chest pain. EXAM: PORTABLE CHEST 1 VIEW COMPARISON:  June 25, 2022. FINDINGS: The heart size and mediastinal contours are within normal limits. Stable right apical pleural thickening or scarring. No acute pulmonary disease is noted. The visualized skeletal structures are unremarkable. IMPRESSION: No active disease. Electronically Signed   By: Rosalene Colon M.D.   On: 12/26/2023 10:16   DG C-ARM BRONCHOSCOPY Result Date: 12/26/2023 C-ARM BRONCHOSCOPY: Fluoroscopy was utilized by the requesting physician.  No radiographic interpretation.       IMPRESSION/PLAN: 1. Limited Stage, cT1cN0M0, small cell carcinoma of the LLL. Dr. Jeryl Moris discusses the pathology findings and reviews the nature of early stage lung cancer. Given the histology, Dr. Marguerita Shih recommends proceeding with systemic chemotherapy to reduce risks of metastatic or recurrent disease. Dr. Jeryl Moris reviews that the standard of care is for surgical resection. However for patients who are not medical candidates to undergo surgery, or who choose to forgo surgery, stereotactic body radiotherapy (SBRT) is an appropriate alternative. The patient would like to forgo additional surgery out of concerns for reducing changes to his quality of life and lung capacity. Rather, he is in  agreement to proceed with radiotherapy and since he does not have a schedule yet for infusion, Dr. Jeryl Moris  favors proceeding with SBRT before chemotherapy begins. We discussed the risks, benefits, short, and long term effects of radiotherapy, as well as the curative intent, and the patient is interested in proceeding. Dr. Jeryl Moris discusses the delivery and logistics of radiotherapy and anticipates 3 fractions of radiation. Written consent is obtained and placed in the chart, a copy was provided to the patient. He will come for simulation tomorrow.  2. Stage IA3, cT1cNM0, NSCLC, Squamous cell carcinoma of the RUL. The patient will be followed in surveillance with Dr. Marguerita Shih for #1.    In a visit lasting 60 minutes, greater than 50% of the time was spent face to face discussing the patient's condition, in preparation for the discussion, and coordinating the patient's care.   The above documentation reflects my direct findings during this shared patient visit. Please see the separate note by Dr. Jeryl Moris on this date for the remainder of the patient's plan of care.    Shelvia Dick, [facility]   **Disclaimer: This note was dictated with voice recognition software. Similar sounding words can inadvertently be transcribed and this note may contain transcription errors which may not have been corrected upon publication of note.**

## 2024-01-10 NOTE — Progress Notes (Signed)
 START ON PATHWAY REGIMEN - Small Cell Lung     A cycle is every 21 days:     Carboplatin      Etoposide   **Always confirm dose/schedule in your pharmacy ordering system**  Patient Characteristics: Newly Diagnosed, Preoperative or Nonsurgical Candidate (Clinical Staging), First Line, Limited Stage, Nonsurgical Candidate Therapeutic Status: Newly Diagnosed, Preoperative or Nonsurgical Candidate (Clinical Staging) AJCC T Category: cT1c AJCC N Category: cN0 AJCC M Category: cM0 AJCC 9 Stage Grouping: IA3 Check here if patient was staged using an edition other than AJCC Staging 9th Edition: false Stage Classification: Limited Surgical Candidacy: Nonsurgical Candidate Intent of Therapy: Curative Intent, Discussed with Patient

## 2024-01-10 NOTE — Telephone Encounter (Signed)
 Called Pt to make aware of PFT scheduling with [facility]. He is currently scheduled for 01/20/2024 at 10am. At this time he has decided he would like to keep the PFT appt, but is going to wait on the f/u scheduling. He is completing a consult with the oncology provider today and will be making a decision then.   If pt is to call back to cancel or reschedule PFT please fwd pt to [phone] for PFT scheduling.   Please offer f/u w/ Dara Ear.

## 2024-01-11 ENCOUNTER — Other Ambulatory Visit: Payer: Self-pay

## 2024-01-11 ENCOUNTER — Ambulatory Visit
Admission: RE | Admit: 2024-01-11 | Discharge: 2024-01-11 | Disposition: A | Discharge status: Home / Self Care | Source: Ambulatory Visit | Attending: Radiation Oncology | Admitting: Radiation Oncology

## 2024-01-11 DIAGNOSIS — F17210 Nicotine dependence, cigarettes, uncomplicated: Secondary | ICD-10-CM | POA: Insufficient documentation

## 2024-01-11 DIAGNOSIS — C3432 Malignant neoplasm of lower lobe, left bronchus or lung: Secondary | ICD-10-CM | POA: Insufficient documentation

## 2024-01-12 NOTE — Telephone Encounter (Signed)
 Patient called back and wanted PFT canceled. Pft at the hospital has been canceled.

## 2024-01-16 DIAGNOSIS — D0359 Melanoma in situ of other part of trunk: Secondary | ICD-10-CM | POA: Diagnosis not present

## 2024-01-16 DIAGNOSIS — Z85828 Personal history of other malignant neoplasm of skin: Secondary | ICD-10-CM | POA: Diagnosis not present

## 2024-01-16 DIAGNOSIS — L988 Other specified disorders of the skin and subcutaneous tissue: Secondary | ICD-10-CM | POA: Diagnosis not present

## 2024-01-16 NOTE — Progress Notes (Signed)
 Pharmacist Chemotherapy Monitoring - Initial Assessment    Anticipated start date: 01/24/24   The following has been reviewed per standard work regarding the patient's treatment regimen: The patient's diagnosis, treatment plan and drug doses, and organ/hematologic function Lab orders and baseline tests specific to treatment regimen  The treatment plan start date, drug sequencing, and pre-medications Prior authorization status  Patient's documented medication list, including drug-drug interaction screen and prescriptions for anti-emetics and supportive care specific to the treatment regimen The drug concentrations, fluid compatibility, administration routes, and timing of the medications to be used The patient's access for treatment and lifetime cumulative dose history, if applicable  The patient's medication allergies and previous infusion related reactions, if applicable   Changes made to treatment plan:  N/A  Follow up needed:  Home antiemetics   Gregory Jensen, PharmD, MBA

## 2024-01-17 ENCOUNTER — Ambulatory Visit (HOSPITAL_COMMUNITY)
Admission: RE | Admit: 2024-01-17 | Discharge: 2024-01-17 | Disposition: A | Discharge status: Home / Self Care | Source: Ambulatory Visit | Attending: Internal Medicine | Admitting: Internal Medicine

## 2024-01-17 ENCOUNTER — Other Ambulatory Visit: Payer: Self-pay | Admitting: Internal Medicine

## 2024-01-17 ENCOUNTER — Ambulatory Visit
Admission: RE | Admit: 2024-01-17 | Discharge: 2024-01-17 | Disposition: A | Discharge status: Home / Self Care | Source: Ambulatory Visit | Attending: Radiation Oncology | Admitting: Radiation Oncology

## 2024-01-17 ENCOUNTER — Other Ambulatory Visit: Payer: Self-pay

## 2024-01-17 DIAGNOSIS — I6782 Cerebral ischemia: Secondary | ICD-10-CM | POA: Diagnosis not present

## 2024-01-17 DIAGNOSIS — C3432 Malignant neoplasm of lower lobe, left bronchus or lung: Secondary | ICD-10-CM | POA: Insufficient documentation

## 2024-01-17 DIAGNOSIS — C3490 Malignant neoplasm of unspecified part of unspecified bronchus or lung: Secondary | ICD-10-CM | POA: Diagnosis not present

## 2024-01-17 DIAGNOSIS — F17210 Nicotine dependence, cigarettes, uncomplicated: Secondary | ICD-10-CM | POA: Diagnosis not present

## 2024-01-17 LAB — RAD ONC ARIA SESSION SUMMARY
Course Elapsed Days: 0
Plan Fractions Treated to Date: 1
Plan Prescribed Dose Per Fraction: 18 Gy
Plan Total Fractions Prescribed: 3
Plan Total Prescribed Dose: 54 Gy
Reference Point Dosage Given to Date: 18 Gy
Reference Point Session Dosage Given: 18 Gy
Session Number: 1

## 2024-01-17 MED ORDER — GADOBUTROL 1 MMOL/ML IV SOLN
8.0000 mL | Freq: Once | INTRAVENOUS | Status: AC | PRN
  Administered 2024-01-17: 8 mL via INTRAVENOUS

## 2024-01-17 MED ORDER — ONDANSETRON HCL 8 MG PO TABS
8.0000 mg | ORAL_TABLET | Freq: Three times a day (TID) | ORAL | 1 refills | Status: AC | PRN
Start: 2024-01-17 — End: ?

## 2024-01-17 MED ORDER — PROCHLORPERAZINE MALEATE 10 MG PO TABS: 10.0000 mg | ORAL_TABLET | Freq: Four times a day (QID) | ORAL | 1 refills | Status: DC | PRN

## 2024-01-18 ENCOUNTER — Inpatient Hospital Stay

## 2024-01-18 ENCOUNTER — Ambulatory Visit: Admitting: Radiation Oncology

## 2024-01-18 DIAGNOSIS — D709 Neutropenia, unspecified: Secondary | ICD-10-CM | POA: Insufficient documentation

## 2024-01-18 DIAGNOSIS — Z923 Personal history of irradiation: Secondary | ICD-10-CM | POA: Insufficient documentation

## 2024-01-18 DIAGNOSIS — G620 Drug-induced polyneuropathy: Secondary | ICD-10-CM | POA: Insufficient documentation

## 2024-01-18 DIAGNOSIS — C3432 Malignant neoplasm of lower lobe, left bronchus or lung: Secondary | ICD-10-CM | POA: Insufficient documentation

## 2024-01-18 DIAGNOSIS — Z5189 Encounter for other specified aftercare: Secondary | ICD-10-CM | POA: Insufficient documentation

## 2024-01-18 DIAGNOSIS — M25511 Pain in right shoulder: Secondary | ICD-10-CM | POA: Insufficient documentation

## 2024-01-18 DIAGNOSIS — Z902 Acquired absence of lung [part of]: Secondary | ICD-10-CM | POA: Insufficient documentation

## 2024-01-18 DIAGNOSIS — J3489 Other specified disorders of nose and nasal sinuses: Secondary | ICD-10-CM | POA: Insufficient documentation

## 2024-01-18 DIAGNOSIS — T451X5A Adverse effect of antineoplastic and immunosuppressive drugs, initial encounter: Secondary | ICD-10-CM | POA: Insufficient documentation

## 2024-01-18 DIAGNOSIS — Z5111 Encounter for antineoplastic chemotherapy: Secondary | ICD-10-CM | POA: Insufficient documentation

## 2024-01-18 DIAGNOSIS — Z87891 Personal history of nicotine dependence: Secondary | ICD-10-CM | POA: Insufficient documentation

## 2024-01-18 DIAGNOSIS — D320 Benign neoplasm of cerebral meninges: Secondary | ICD-10-CM | POA: Insufficient documentation

## 2024-01-18 NOTE — Progress Notes (Signed)
 Prior authorization for Ondansetron  HCL 8 mg Tablets approved through 08/15/2024. Pharmacy and Patient notified. No other needs or concerns noted at this time.

## 2024-01-19 ENCOUNTER — Encounter: Payer: Self-pay | Admitting: Family Medicine

## 2024-01-19 ENCOUNTER — Other Ambulatory Visit: Payer: Self-pay

## 2024-01-19 ENCOUNTER — Ambulatory Visit: Payer: PPO | Admitting: Family Medicine

## 2024-01-19 ENCOUNTER — Ambulatory Visit
Admission: RE | Admit: 2024-01-19 | Discharge: 2024-01-19 | Disposition: A | Discharge status: Home / Self Care | Source: Ambulatory Visit | Attending: Radiation Oncology | Admitting: Radiation Oncology

## 2024-01-19 VITALS — BP 131/65 | HR 77 | Temp 98.1°F | Ht 68.0 in | Wt 185.6 lb

## 2024-01-19 DIAGNOSIS — Z7984 Long term (current) use of oral hypoglycemic drugs: Secondary | ICD-10-CM

## 2024-01-19 DIAGNOSIS — E1169 Type 2 diabetes mellitus with other specified complication: Secondary | ICD-10-CM | POA: Diagnosis not present

## 2024-01-19 DIAGNOSIS — I152 Hypertension secondary to endocrine disorders: Secondary | ICD-10-CM | POA: Diagnosis not present

## 2024-01-19 DIAGNOSIS — M479 Spondylosis, unspecified: Secondary | ICD-10-CM | POA: Diagnosis not present

## 2024-01-19 DIAGNOSIS — E1159 Type 2 diabetes mellitus with other circulatory complications: Secondary | ICD-10-CM | POA: Diagnosis not present

## 2024-01-19 DIAGNOSIS — Z0000 Encounter for general adult medical examination without abnormal findings: Secondary | ICD-10-CM

## 2024-01-19 DIAGNOSIS — G6289 Other specified polyneuropathies: Secondary | ICD-10-CM

## 2024-01-19 DIAGNOSIS — E119 Type 2 diabetes mellitus without complications: Secondary | ICD-10-CM

## 2024-01-19 DIAGNOSIS — E785 Hyperlipidemia, unspecified: Secondary | ICD-10-CM

## 2024-01-19 DIAGNOSIS — C3432 Malignant neoplasm of lower lobe, left bronchus or lung: Secondary | ICD-10-CM | POA: Diagnosis not present

## 2024-01-19 LAB — RAD ONC ARIA SESSION SUMMARY
Course Elapsed Days: 2
Plan Fractions Treated to Date: 2
Plan Prescribed Dose Per Fraction: 18 Gy
Plan Total Fractions Prescribed: 3
Plan Total Prescribed Dose: 54 Gy
Reference Point Dosage Given to Date: 36 Gy
Reference Point Session Dosage Given: 18 Gy
Session Number: 2

## 2024-01-19 LAB — POCT GLYCOSYLATED HEMOGLOBIN (HGB A1C): Hemoglobin A1C: 6.7 % — AB (ref 4.0–5.6)

## 2024-01-19 LAB — MICROALBUMIN / CREATININE URINE RATIO
Creatinine,U: 106 mg/dL
Microalb Creat Ratio: 42.6 mg/g — ABNORMAL HIGH (ref 0.0–30.0)
Microalb, Ur: 4.5 mg/dL — ABNORMAL HIGH (ref 0.0–1.9)

## 2024-01-19 MED ORDER — TRUE METRIX METER W/DEVICE KIT: PACK | 0 refills | Status: DC

## 2024-01-19 MED ORDER — ONETOUCH DELICA LANCETS 33G MISC: 1.0000 | Freq: Every day | 4 refills | Status: AC | PRN

## 2024-01-19 MED ORDER — ONETOUCH VERIO VI STRP
1.0000 | ORAL_STRIP | Freq: Every day | 4 refills | Status: AC | PRN
Start: 2024-01-19 — End: ?

## 2024-01-19 NOTE — Assessment & Plan Note (Signed)
 Has had mild elbow lesions recently however at goal today on losartan  50 mg daily.  He will monitor at home and let us  know if persistently elevated.

## 2024-01-19 NOTE — Assessment & Plan Note (Signed)
 Recent nerve conduction study showed sensorimotor polyneuropathy.  He is not interested in any further evaluation for this at this point.

## 2024-01-19 NOTE — Patient Instructions (Signed)
 It was very nice to see you today!  Your A1c and blood pressure are at goal today.  Will see you back in 6 months for annual physical.  Please come back sooner if needed.  Return in about 6 months (around 07/20/2024) for Annual Physical.   Take care, Dr Daneil Dunker  PLEASE NOTE:  If you had any lab tests, please let us  know if you have not heard back within a few days. You may see your results on mychart before we have a chance to review them but we will give you a call once they are reviewed by us .   If we ordered any referrals today, please let us  know if you have not heard from their office within the next week.   If you had any urgent prescriptions sent in today, please check with the pharmacy within an hour of our visit to make sure the prescription was transmitted appropriately.   Please try these tips to maintain a healthy lifestyle:  Eat at least 3 REAL meals and 1-2 snacks per day.  Aim for no more than 5 hours between eating.  If you eat breakfast, please do so within one hour of getting up.   Each meal should contain half fruits/vegetables, one quarter protein, and one quarter carbs (no bigger than a computer mouse)  Cut down on sweet beverages. This includes juice, soda, and sweet tea.   Drink at least 1 glass of water with each meal and aim for at least 8 glasses per day  Exercise at least 150 minutes every week.

## 2024-01-19 NOTE — Assessment & Plan Note (Signed)
 May be contributing to his neuropathy.  We did discuss having him follow back up with orthopedics this year if there are any other interventions that he may fit from however he would like to hold off on this for now.

## 2024-01-19 NOTE — Assessment & Plan Note (Signed)
 A1c stable at 6.7.  Doing well with metformin  500 mg twice daily.  Recheck in 6 months.

## 2024-01-19 NOTE — Progress Notes (Signed)
   Gregory Jensen is a 68 y.o. male who presents today for an office visit.  Assessment/Plan:  Chronic Problems Addressed Today: Diabetes mellitus without complication (HCC) A1c stable at 6.7.  Doing well with metformin  500 mg twice daily.  Recheck in 6 months.  Hypertension associated with diabetes (HCC) Has had mild elbow lesions recently however at goal today on losartan  50 mg daily.  He will monitor at home and let us  know if persistently elevated.  Peripheral neuropathy Recent nerve conduction study showed sensorimotor polyneuropathy.  He is not interested in any further evaluation for this at this point.  Spondylosis May be contributing to his neuropathy.  We did discuss having him follow back up with orthopedics this year if there are any other interventions that he may fit from however he would like to hold off on this for now.  Primary small cell carcinoma of lower lobe of left lung (HCC) Recently diagnosed.  Will be undergoing radiation and chemotherapy soon.     Subjective:  HPI:  See A/P for status of chronic conditions.  Patient is here today for follow-up.  I last saw him about 6 months ago.  At that time A1c was stable at 6.3 with metformin  500 mg twice daily however he was still having ongoing issues with peripheral neuropathy and arm pain. We obtained MRI at that time which showed several areas of degenerative changes and nerve impingement.  We recommended that he follow-up with orthopedics for ongoing management.  He did see orthopedics who recommended he follow-up with neurology for evaluation for peripheral neuropathy.  Nerve conduction showed likely from chronic sensorimotor polyneuropathy.  Neurology did not offer him any further interventions.  He has also been following with oncology for lung cancer.  Recently was found to have a new nodule was diagnosed as small cell lung cancer last month.  There planning on radiotherapy and chemotherapy to prevent metastasis.   He also recently had melanoma removed off his back.        Objective:  Physical Exam: BP 131/65   Pulse 77   Temp 98.1 F (36.7 C) (Temporal)   Ht 5\' 8"  (1.727 m)   Wt 185 lb 9.6 oz (84.2 kg)   SpO2 97%   BMI 28.22 kg/m   Gen: No acute distress, resting comfortably CV: Regular rate and rhythm with no murmurs appreciated Pulm: Normal work of breathing, clear to auscultation bilaterally with no crackles, wheezes, or rhonchi Neuro: Grossly normal, moves all extremities Psych: Normal affect and thought content      Georgeann Brinkman M. Daneil Dunker, MD 01/19/2024 9:54 AM

## 2024-01-19 NOTE — Assessment & Plan Note (Signed)
 Recently diagnosed.  Will be undergoing radiation and chemotherapy soon.

## 2024-01-20 ENCOUNTER — Ambulatory Visit: Admitting: Radiation Oncology

## 2024-01-20 ENCOUNTER — Encounter (HOSPITAL_COMMUNITY)

## 2024-01-20 ENCOUNTER — Ambulatory Visit: Payer: Self-pay | Admitting: Family Medicine

## 2024-01-20 NOTE — Progress Notes (Signed)
 His urine sample indicates that he has a mild amount of protein in his urine.  We should recheck again at his next office visit here.  Do not need to make any changes to treatment plan at this time.

## 2024-01-23 ENCOUNTER — Inpatient Hospital Stay

## 2024-01-23 ENCOUNTER — Ambulatory Visit
Admission: RE | Admit: 2024-01-23 | Discharge: 2024-01-23 | Disposition: A | Discharge status: Home / Self Care | Source: Ambulatory Visit | Attending: Radiation Oncology

## 2024-01-23 ENCOUNTER — Other Ambulatory Visit: Payer: Self-pay

## 2024-01-23 ENCOUNTER — Ambulatory Visit

## 2024-01-23 ENCOUNTER — Ambulatory Visit
Admission: RE | Admit: 2024-01-23 | Discharge: 2024-01-23 | Disposition: A | Discharge status: Home / Self Care | Source: Ambulatory Visit | Attending: Radiation Oncology | Admitting: Radiation Oncology

## 2024-01-23 ENCOUNTER — Encounter: Payer: Self-pay | Admitting: Internal Medicine

## 2024-01-23 DIAGNOSIS — C3432 Malignant neoplasm of lower lobe, left bronchus or lung: Secondary | ICD-10-CM | POA: Diagnosis not present

## 2024-01-23 DIAGNOSIS — Z510 Encounter for antineoplastic radiation therapy: Secondary | ICD-10-CM | POA: Diagnosis not present

## 2024-01-23 DIAGNOSIS — F17210 Nicotine dependence, cigarettes, uncomplicated: Secondary | ICD-10-CM | POA: Diagnosis not present

## 2024-01-23 LAB — RAD ONC ARIA SESSION SUMMARY
Course Elapsed Days: 6
Plan Fractions Treated to Date: 3
Plan Prescribed Dose Per Fraction: 18 Gy
Plan Total Fractions Prescribed: 3
Plan Total Prescribed Dose: 54 Gy
Reference Point Dosage Given to Date: 54 Gy
Reference Point Session Dosage Given: 18 Gy
Session Number: 3

## 2024-01-23 LAB — CBC WITH DIFFERENTIAL (CANCER CENTER ONLY)
Abs Immature Granulocytes: 0.03 10*3/uL (ref 0.00–0.07)
Basophils Absolute: 0.1 10*3/uL (ref 0.0–0.1)
Basophils Relative: 1 %
Eosinophils Absolute: 0.1 10*3/uL (ref 0.0–0.5)
Eosinophils Relative: 2 %
HCT: 39.7 % (ref 39.0–52.0)
Hemoglobin: 14.1 g/dL (ref 13.0–17.0)
Immature Granulocytes: 0 %
Lymphocytes Relative: 19 %
Lymphs Abs: 1.6 10*3/uL (ref 0.7–4.0)
MCH: 32 pg (ref 26.0–34.0)
MCHC: 35.5 g/dL (ref 30.0–36.0)
MCV: 90.2 fL (ref 80.0–100.0)
Monocytes Absolute: 0.5 10*3/uL (ref 0.1–1.0)
Monocytes Relative: 6 %
Neutro Abs: 6.1 10*3/uL (ref 1.7–7.7)
Neutrophils Relative %: 72 %
Platelet Count: 206 10*3/uL (ref 150–400)
RBC: 4.4 MIL/uL (ref 4.22–5.81)
RDW: 12.3 % (ref 11.5–15.5)
WBC Count: 8.5 10*3/uL (ref 4.0–10.5)
nRBC: 0 % (ref 0.0–0.2)

## 2024-01-23 LAB — CMP (CANCER CENTER ONLY)
ALT: 15 U/L (ref 0–44)
AST: 16 U/L (ref 15–41)
Albumin: 4.7 g/dL (ref 3.5–5.0)
Alkaline Phosphatase: 58 U/L (ref 38–126)
Anion gap: 8 (ref 5–15)
BUN: 20 mg/dL (ref 8–23)
CO2: 26 mmol/L (ref 22–32)
Calcium: 10 mg/dL (ref 8.9–10.3)
Chloride: 105 mmol/L (ref 98–111)
Creatinine: 1.12 mg/dL (ref 0.61–1.24)
GFR, Estimated: >60 mL/min (ref >60)
Glucose, Bld: 155 mg/dL — ABNORMAL HIGH (ref 70–99)
Potassium: 3.8 mmol/L (ref 3.5–5.1)
Sodium: 139 mmol/L (ref 135–145)
Total Bilirubin: 0.4 mg/dL (ref 0.0–1.2)
Total Protein: 7.4 g/dL (ref 6.5–8.1)

## 2024-01-23 MED FILL — Fosaprepitant Dimeglumine For IV Infusion 150 MG (Base Eq): INTRAVENOUS | Qty: 5 | Status: AC

## 2024-01-23 NOTE — Progress Notes (Signed)
  Radiation Oncology         [phone] ________________________________  Name: Gregory Jensen MRN: [id number]  Date: 01/23/2024  DOB: 1955/08/23  End of Treatment Note  Diagnosis:   Limited Stage, cT1cN0M0, small cell carcinoma of the LLL.    Indication for treatment:  Curative       Radiation treatment dates:   01/17/24-01/23/24  Site/dose:   The tumor in the LLL was treated with a course of stereotactic body radiation treatment. The patient received 54 Gy In 3 fractions at 18 G per fraction.  Narrative: The patient tolerated radiation treatment relatively well.   The patient did not have any signs of acute toxicity during treatment.  Plan: The patient will receive a call in about one month from the radiation oncology department. He will continue follow up with Dr. Marguerita Shih as well. We briefly discussed the rationale for prophylactic cranial irradiation which we will revisit after the patient completes chemotherapy.      Shelvia Dick, PAC

## 2024-01-24 ENCOUNTER — Encounter: Payer: Self-pay | Admitting: Internal Medicine

## 2024-01-24 ENCOUNTER — Inpatient Hospital Stay

## 2024-01-24 ENCOUNTER — Inpatient Hospital Stay: Admitting: Internal Medicine

## 2024-01-24 ENCOUNTER — Other Ambulatory Visit: Payer: Self-pay | Admitting: *Deleted

## 2024-01-24 VITALS — BP 140/71 | HR 81 | Temp 98.1°F | Resp 19 | Wt 183.6 lb

## 2024-01-24 VITALS — BP 142/69 | HR 67 | Temp 98.2°F | Resp 16 | Wt 184.2 lb

## 2024-01-24 DIAGNOSIS — C3432 Malignant neoplasm of lower lobe, left bronchus or lung: Secondary | ICD-10-CM

## 2024-01-24 DIAGNOSIS — E119 Type 2 diabetes mellitus without complications: Secondary | ICD-10-CM

## 2024-01-24 MED ORDER — SODIUM CHLORIDE 0.9 % IV SOLN
150.0000 mg | Freq: Once | INTRAVENOUS | Status: AC
  Administered 2024-01-24: 150 mg via INTRAVENOUS
  Filled 2024-01-24: qty 150

## 2024-01-24 MED ORDER — SODIUM CHLORIDE 0.9 % IV SOLN: INTRAVENOUS | Status: DC

## 2024-01-24 MED ORDER — DEXAMETHASONE SODIUM PHOSPHATE 10 MG/ML IJ SOLN
10.0000 mg | Freq: Once | INTRAMUSCULAR | Status: AC
  Administered 2024-01-24: 10 mg via INTRAVENOUS
  Filled 2024-01-24: qty 1

## 2024-01-24 MED ORDER — SODIUM CHLORIDE 0.9 % IV SOLN
492.5000 mg | Freq: Once | INTRAVENOUS | Status: AC
  Administered 2024-01-24: 490 mg via INTRAVENOUS
  Filled 2024-01-24: qty 49

## 2024-01-24 MED ORDER — SODIUM CHLORIDE 0.9 % IV SOLN
100.0000 mg/m2 | Freq: Once | INTRAVENOUS | Status: AC
  Administered 2024-01-24: 200 mg via INTRAVENOUS
  Filled 2024-01-24: qty 10

## 2024-01-24 MED ORDER — PALONOSETRON HCL INJECTION 0.25 MG/5ML
0.2500 mg | Freq: Once | INTRAVENOUS | Status: AC
  Administered 2024-01-24: 0.25 mg via INTRAVENOUS
  Filled 2024-01-24: qty 5

## 2024-01-24 NOTE — Patient Instructions (Signed)
 [facility] - A DEPT OF [facility]  Discharge Instructions: Thank you for choosing [facility] to provide your oncology and hematology care.   If you have a lab appointment with the Cancer Center, please go directly to the Cancer Center and check in at the registration area.   Wear comfortable clothing and clothing appropriate for easy access to any Portacath or PICC line.   We strive to give you quality time with your provider. You may need to reschedule your appointment if you arrive late (15 or more minutes).  Arriving late affects you and other patients whose appointments are after yours.  Also, if you miss three or more appointments without notifying the office, you may be dismissed from the clinic at the provider's discretion.      For prescription refill requests, have your pharmacy contact our office and allow 72 hours for refills to be completed.    Today you received the following chemotherapy and/or immunotherapy agents CARBOPLATIN and ETOPOSIDE   To help prevent nausea and vomiting after your treatment, we encourage you to take your nausea medication as directed.  BELOW ARE SYMPTOMS THAT SHOULD BE REPORTED IMMEDIATELY: *FEVER GREATER THAN 100.4 F (38 C) OR HIGHER *CHILLS OR SWEATING *NAUSEA AND VOMITING THAT IS NOT CONTROLLED WITH YOUR NAUSEA MEDICATION *UNUSUAL SHORTNESS OF BREATH *UNUSUAL BRUISING OR BLEEDING *URINARY PROBLEMS (pain or burning when urinating, or frequent urination) *BOWEL PROBLEMS (unusual diarrhea, constipation, pain near the anus) TENDERNESS IN MOUTH AND THROAT WITH OR WITHOUT PRESENCE OF ULCERS (sore throat, sores in mouth, or a toothache) UNUSUAL RASH, SWELLING OR PAIN  UNUSUAL VAGINAL DISCHARGE OR ITCHING   Items with * indicate a potential emergency and should be followed up as soon as possible or go to the Emergency Department if any problems should occur.  Please show the CHEMOTHERAPY ALERT CARD or  IMMUNOTHERAPY ALERT CARD at check-in to the Emergency Department and triage nurse.  Should you have questions after your visit or need to cancel or reschedule your appointment, please contact [facility] - A DEPT OF [facility]  Dept: [phone]  and follow the prompts.  Office hours are 8:00 a.m. to 4:30 p.m. Monday - Friday. Please note that voicemails left after 4:00 p.m. may not be returned until the following business day.  We are closed weekends and major holidays. You have access to a nurse at all times for urgent questions. Please call the main number to the clinic Dept: [phone] and follow the prompts.   For any non-urgent questions, you may also contact your provider using MyChart. We now offer e-Visits for anyone 36 and older to request care online for non-urgent symptoms. For details visit [email or web address].   Also download the MyChart app! Go to the app store, search "MyChart", open the app, select [facility], and log in with your MyChart username and password.  Carboplatin Injection What is this medication? CARBOPLATIN (KAR boe pla tin) treats some types of cancer. It works by slowing down the growth of cancer cells. This medicine may be used for other purposes; ask your health care provider or pharmacist if you have questions. COMMON BRAND NAME(S): Paraplatin What should I tell my care team before I take this medication? They need to know if you have any of these conditions: Blood disorders Hearing problems Kidney disease Recent or ongoing radiation therapy An unusual or allergic reaction to carboplatin, cisplatin, other medications,  foods, dyes, or preservatives Pregnant or trying to get pregnant Breast-feeding How should I use this medication? This medication is injected into a vein. It is given by your care team in a hospital or clinic setting. Talk to your care team about the use of this medication in children. Special care may  be needed. Overdosage: If you think you have taken too much of this medicine contact a poison control center or emergency room at once. NOTE: This medicine is only for you. Do not share this medicine with others. What if I miss a dose? Keep appointments for follow-up doses. It is important not to miss your dose. Call your care team if you are unable to keep an appointment. What may interact with this medication? Medications for seizures Some antibiotics, such as amikacin, gentamicin, neomycin , streptomycin, tobramycin Vaccines This list may not describe all possible interactions. Give your health care provider a list of all the medicines, herbs, non-prescription drugs, or dietary supplements you use. Also tell them if you smoke, drink alcohol, or use illegal drugs. Some items may interact with your medicine. What should I watch for while using this medication? Your condition will be monitored carefully while you are receiving this medication. You may need blood work while taking this medication. This medication may make you feel generally unwell. This is not uncommon, as chemotherapy can affect healthy cells as well as cancer cells. Report any side effects. Continue your course of treatment even though you feel ill unless your care team tells you to stop. In some cases, you may be given additional medications to help with side effects. Follow all directions for their use. This medication may increase your risk of getting an infection. Call your care team for advice if you get a fever, chills, sore throat, or other symptoms of a cold or flu. Do not treat yourself. Try to avoid being around people who are sick. Avoid taking medications that contain aspirin , acetaminophen , ibuprofen, naproxen, or ketoprofen unless instructed by your care team. These medications may hide a fever. Be careful brushing or flossing your teeth or using a toothpick because you may get an infection or bleed more easily. If you  have any dental work done, tell your dentist you are receiving this medication. Talk to your care team if you wish to become pregnant or think you might be pregnant. This medication can cause serious birth defects. Talk to your care team about effective forms of contraception. Do not breast-feed while taking this medication. What side effects may I notice from receiving this medication? Side effects that you should report to your care team as soon as possible: Allergic reactions--skin rash, itching, hives, swelling of the face, lips, tongue, or throat Infection--fever, chills, cough, sore throat, wounds that don't heal, pain or trouble when passing urine, general feeling of discomfort or being unwell Low red blood cell level--unusual weakness or fatigue, dizziness, headache, trouble breathing Pain, tingling, or numbness in the hands or feet, muscle weakness, change in vision, confusion or trouble speaking, loss of balance or coordination, trouble walking, seizures Unusual bruising or bleeding Side effects that usually do not require medical attention (report to your care team if they continue or are bothersome): Hair loss Nausea Unusual weakness or fatigue Vomiting This list may not describe all possible side effects. Call your doctor for medical advice about side effects. You may report side effects to FDA at 1-800-FDA-1088. Where should I keep my medication? This medication is given in a hospital or clinic.  It will not be stored at home. NOTE: This sheet is a summary. It may not cover all possible information. If you have questions about this medicine, talk to your doctor, pharmacist, or health care provider.  2024 Elsevier/Gold Standard (2021-11-24 00:00:00) Etoposide Injection What is this medication? ETOPOSIDE (e toe POE side) treats some types of cancer. It works by slowing down the growth of cancer cells. This medicine may be used for other purposes; ask your health care provider or  pharmacist if you have questions. COMMON BRAND NAME(S): Etopophos, Toposar, VePesid What should I tell my care team before I take this medication? They need to know if you have any of these conditions: Infection Kidney disease Liver disease Low blood counts, such as low white cell, platelet, red cell counts An unusual or allergic reaction to etoposide, other medications, foods, dyes, or preservatives If you or your partner are pregnant or trying to get pregnant Breastfeeding How should I use this medication? This medication is injected into a vein. It is given by your care team in a hospital or clinic setting. Talk to your care team about the use of this medication in children. Special care may be needed. Overdosage: If you think you have taken too much of this medicine contact a poison control center or emergency room at once. NOTE: This medicine is only for you. Do not share this medicine with others. What if I miss a dose? Keep appointments for follow-up doses. It is important not to miss your dose. Call your care team if you are unable to keep an appointment. What may interact with this medication? Warfarin This list may not describe all possible interactions. Give your health care provider a list of all the medicines, herbs, non-prescription drugs, or dietary supplements you use. Also tell them if you smoke, drink alcohol, or use illegal drugs. Some items may interact with your medicine. What should I watch for while using this medication? Your condition will be monitored carefully while you are receiving this medication. This medication may make you feel generally unwell. This is not uncommon as chemotherapy can affect healthy cells as well as cancer cells. Report any side effects. Continue your course of treatment even though you feel ill unless your care team tells you to stop. This medication can cause serious side effects. To reduce the risk, your care team may give you other  medications to take before receiving this one. Be sure to follow the directions from your care team. This medication may increase your risk of getting an infection. Call your care team for advice if you get a fever, chills, sore throat, or other symptoms of a cold or flu. Do not treat yourself. Try to avoid being around people who are sick. This medication may increase your risk to bruise or bleed. Call your care team if you notice any unusual bleeding. Talk to your care team about your risk of cancer. You may be more at risk for certain types of cancers if you take this medication. Talk to your care team if you may be pregnant. Serious birth defects can occur if you take this medication during pregnancy and for 6 months after the last dose. You will need a negative pregnancy test before starting this medication. Contraception is recommended while taking this medication and for 6 months after the last dose. Your care team can help you find the option that works for you. If your partner can get pregnant, use a condom during sex while taking this  medication and for 4 months after the last dose. Do not breastfeed while taking this medication. This medication may cause infertility. Talk to your care team if you are concerned about your fertility. What side effects may I notice from receiving this medication? Side effects that you should report to your care team as soon as possible: Allergic reactions--skin rash, itching, hives, swelling of the face, lips, tongue, or throat Infection--fever, chills, cough, sore throat, wounds that don't heal, pain or trouble when passing urine, general feeling of discomfort or being unwell Low red blood cell level--unusual weakness or fatigue, dizziness, headache, trouble breathing Unusual bruising or bleeding Side effects that usually do not require medical attention (report to your care team if they continue or are bothersome): Diarrhea Fatigue Hair loss Loss of  appetite Nausea Vomiting This list may not describe all possible side effects. Call your doctor for medical advice about side effects. You may report side effects to FDA at 1-800-FDA-1088. Where should I keep my medication? This medication is given in a hospital or clinic. It will not be stored at home. NOTE: This sheet is a summary. It may not cover all possible information. If you have questions about this medicine, talk to your doctor, pharmacist, or health care provider.  2024 Elsevier/Gold Standard (2021-12-24 00:00:00)

## 2024-01-24 NOTE — Radiation Completion Notes (Signed)
 Patient Name: ATILANO, COVELLI MRN: [id number] Date of Birth: [birth year] Referring Physician: Racheal Buddle, M.D. Date of Service: 2024-01-24 Radiation Oncologist: Johna Myers, M.D. 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) T=cT1c, N=cN0, M=cM0 Intent: Curative     ==========DELIVERED PLANS==========  First Treatment Date: 2024-01-17 Last Treatment Date: 2024-01-23   Plan Name: Lung_L_SBRT Site: Lung, Left Technique: SBRT/SRT-IMRT Mode: Photon Dose Per Fraction: 18 Gy Prescribed Dose (Delivered / Prescribed): 54 Gy / 54 Gy Prescribed Fxs (Delivered / Prescribed): 3 / 3     ==========ON TREATMENT VISIT DATES========== 2024-01-17, 2024-01-19, 2024-01-23, 2024-01-23     ==========UPCOMING VISITS==========       ==========APPENDIX - ON TREATMENT VISIT NOTES==========   See weekly On Treatment Notes in Epic for details in the Media tab (listed as Progress notes on the On Treatment Visit Dates listed above).

## 2024-01-24 NOTE — Progress Notes (Signed)
 [facility] [phone]   Fax:([phone]) [phone]  OFFICE PROGRESS NOTE  Rodney Clamp, MD [address] [place] [address]  DIAGNOSIS:   1) stage IA (T1c, N0, M0) small cell lung cancer presented with left lower lobe lung nodule diagnosed in May 2025.  2) Stage IA (T1c, N0, M0) non-small cell lung cancer, squamous cell carcinoma presented with right upper lobe in July 2023   PRIOR THERAPY: Status post right upper lobectomy with lymph node sampling under the care of Dr. Deloise Ferries on 05/10/2022 with tumor size of 2.3 cm.   CURRENT THERAPY: Systemic chemotherapy with carboplatin for AUC of 5 on day 1 and oh etoposide 100 Mg/M2 on days 1, 2 and 3 every 3 weeks.  First dose 01/23/2024.  This is concurrent with SBRT to the left lower lobe lung nodule under the care of Dr. Jeryl Moris.  INTERVAL HISTORY: Gregory Jensen 68 y.o. male returns to the clinic today for follow-up visit. Discussed the use of AI scribe software for clinical note transcription with the patient, who gave verbal consent to proceed.  History of Present Illness   Gregory Jensen is a 68 year old male who presents for follow-up after completing radiation treatment for stage IA (T1c, N0, M0) small cell lung cancer presented with left lower lobe lung nodule diagnosed in May 2025. Gregory Jensen He is accompanied by his wife.  He recently completed a course of radiation therapy, having undergone three sessions instead of the initially planned five. The last session was yesterday, and no further radiation sessions are scheduled. He has not experienced any adverse effects from the radiation, stating, 'I don't feel any different.'  An MRI of the head performed last week revealed an 11 by 4 millimeter dural-based mass.  No nausea, vomiting, diarrhea, chest pain, breathing issues, or recent weight loss, although he has lost approximately two pounds.       MEDICAL HISTORY: Past Medical History:  Diagnosis Date    Cancer (HCC) 4 years ago   Prostate Cancer , Lung Cancer   Diabetes mellitus without complication (HCC)    type 2   Hyperlipidemia    Hypertension    Lung cancer (HCC)    Left Lower Lobe Lung   Neuromuscular disorder (HCC)    neuropathy   Neuropathy    in legs causing balance issues per patient   Sleep apnea    Smoker     ALLERGIES:  is allergic to jardiance  [empagliflozin ].  MEDICATIONS:  Current Outpatient Medications  Medication Sig Dispense Refill   aspirin  81 MG EC tablet Take 81 mg by mouth in the morning.     atorvastatin  (LIPITOR) 40 MG tablet TAKE 1 TABLET BY MOUTH EVERY DAY 90 tablet 0   Blood Glucose Monitoring Suppl (TRUE METRIX METER) w/Device KIT USE AS DIRECTED 1 kit 0   cyanocobalamin  1000 MCG tablet Take 1,000 mcg by mouth daily.     fenofibrate  (TRICOR ) 145 MG tablet Take 1 tablet (145 mg total) by mouth daily. 90 tablet 0   glucose blood (ONETOUCH VERIO) test strip 1 each by Other route daily as needed for other. Use as instructed 100 each 4   ibuprofen (ADVIL) 200 MG tablet Take 400-600 mg by mouth daily as needed (pain.).     losartan  (COZAAR ) 50 MG tablet TAKE 1 TABLET BY MOUTH EVERY DAY 90 tablet 1   metFORMIN  (GLUCOPHAGE ) 500 MG tablet TAKE 1 TABLET BY MOUTH TWICE A DAY WITH  FOOD 180 tablet 1   ondansetron  (ZOFRAN ) 8 MG tablet Take 1 tablet (8 mg total) by mouth every 8 (eight) hours as needed for nausea or vomiting. Start on third day after chemotherapy. 30 tablet 1   OneTouch Delica Lancets 33G MISC 1 each by Does not apply route daily as needed. 100 each 4   prochlorperazine  (COMPAZINE ) 10 MG tablet Take 1 tablet (10 mg total) by mouth every 6 (six) hours as needed for nausea or vomiting (Nausea or vomiting). 30 tablet 1   No current facility-administered medications for this visit.    SURGICAL HISTORY:  Past Surgical History:  Procedure Laterality Date   BACK SURGERY  15 years ago   BRONCHIAL BIOPSY  05/10/2022   Procedure: BRONCHIAL BIOPSIES;   Surgeon: Denson Flake, MD;  Location: [facility] ENDOSCOPY;  Service: Pulmonary;;   BRONCHIAL BIOPSY  12/26/2023   Procedure: BRONCHOSCOPY, WITH BIOPSY;  Surgeon: Denson Flake, MD;  Location: [facility] ENDOSCOPY;  Service: Pulmonary;;   BRONCHIAL BRUSHINGS  05/10/2022   Procedure: BRONCHIAL BRUSHINGS;  Surgeon: Denson Flake, MD;  Location: [facility] ENDOSCOPY;  Service: Pulmonary;;   BRONCHIAL BRUSHINGS  12/26/2023   Procedure: BRONCHOSCOPY, WITH BRUSH BIOPSY;  Surgeon: Denson Flake, MD;  Location: [facility] ENDOSCOPY;  Service: Pulmonary;;   BRONCHIAL NEEDLE ASPIRATION BIOPSY  05/10/2022   Procedure: BRONCHIAL NEEDLE ASPIRATION BIOPSIES;  Surgeon: Denson Flake, MD;  Location: [facility] ENDOSCOPY;  Service: Pulmonary;;   BRONCHIAL NEEDLE ASPIRATION BIOPSY  12/26/2023   Procedure: BRONCHOSCOPY, WITH NEEDLE ASPIRATION BIOPSY;  Surgeon: Denson Flake, MD;  Location: [facility] ENDOSCOPY;  Service: Pulmonary;;   COLONOSCOPY  2007   pt does not know MD name/normal exam per pt.   FIDUCIAL MARKER PLACEMENT  05/10/2022   Procedure: FIDUCIAL DYE MARKING;  Surgeon: Denson Flake, MD;  Location: [facility] ENDOSCOPY;  Service: Pulmonary;;   FIDUCIAL MARKER PLACEMENT  12/26/2023   Procedure: INSERTION, FIDUCIAL MARKERS;  Surgeon: Denson Flake, MD;  Location: [facility] ENDOSCOPY;  Service: Pulmonary;;   INTERCOSTAL NERVE BLOCK Right 05/10/2022   Procedure: INTERCOSTAL NERVE BLOCK;  Surgeon: Hilarie Lovely, MD;  Location: [facility] OR;  Service: Thoracic;  Laterality: Right;   NODE DISSECTION Right 05/10/2022   Procedure: NODE DISSECTION;  Surgeon: Hilarie Lovely, MD;  Location: [facility] OR;  Service: Thoracic;  Laterality: Right;   PROSTATECTOMY  4 years ago   VIDEO BRONCHOSCOPY WITH ENDOBRONCHIAL NAVIGATION Left 12/26/2023   Procedure: VIDEO BRONCHOSCOPY WITH ENDOBRONCHIAL NAVIGATION;  Surgeon: Denson Flake, MD;  Location: [facility] ENDOSCOPY;  Service: Pulmonary;  Laterality: Left;    REVIEW OF SYSTEMS:  Constitutional: positive for fatigue Eyes:  negative Ears, nose, mouth, throat, and face: negative Respiratory: negative Cardiovascular: negative Gastrointestinal: negative Genitourinary:negative Integument/breast: negative Hematologic/lymphatic: negative Musculoskeletal:negative Neurological: negative Behavioral/Psych: negative Endocrine: negative Allergic/Immunologic: negative   PHYSICAL EXAMINATION: General appearance: alert, cooperative, fatigued, and no distress Head: Normocephalic, without obvious abnormality, atraumatic Neck: no adenopathy, no JVD, supple, symmetrical, trachea midline, and thyroid  not enlarged, symmetric, no tenderness/mass/nodules Lymph nodes: Cervical, supraclavicular, and axillary nodes normal. Resp: clear to auscultation bilaterally Back: symmetric, no curvature. ROM normal. No CVA tenderness. Cardio: regular rate and rhythm, S1, S2 normal, no murmur, click, rub or gallop GI: soft, non-tender; bowel sounds normal; no masses,  no organomegaly Extremities: extremities normal, atraumatic, no cyanosis or edema Neurologic: Alert and oriented X 3, normal strength and tone. Normal symmetric reflexes. Normal coordination and gait  ECOG PERFORMANCE STATUS: 1 - Symptomatic but completely ambulatory  Blood pressure Gregory Jensen)  140/71, pulse 81, temperature 98.1 F (36.7 C), temperature source Temporal, resp. rate 19, weight 183 lb 9.6 oz (83.3 kg), SpO2 98%.  LABORATORY DATA: Lab Results  Component Value Date   WBC 8.5 01/23/2024   HGB 14.1 01/23/2024   HCT 39.7 01/23/2024   MCV 90.2 01/23/2024   PLT 206 01/23/2024      Chemistry      Component Value Date/Time   NA 139 01/23/2024 1317   NA 138 04/27/2022 0847   K 3.8 01/23/2024 1317   CL 105 01/23/2024 1317   CO2 26 01/23/2024 1317   BUN 20 01/23/2024 1317   BUN 19 04/27/2022 0847   CREATININE 1.12 01/23/2024 1317   CREATININE 0.97 07/23/2020 0827      Component Value Date/Time   CALCIUM  10.0 01/23/2024 1317   ALKPHOS 58 01/23/2024 1317   AST 16  01/23/2024 1317   ALT 15 01/23/2024 1317   BILITOT 0.4 01/23/2024 1317       RADIOGRAPHIC STUDIES: MR BRAIN W WO CONTRAST Result Date: 01/23/2024 CLINICAL DATA:  Provided history: Malignant neoplasm of unspecified part of unspecified bronchus or lung. Small cell lung cancer, staging. EXAM: MRI HEAD WITHOUT AND WITH CONTRAST TECHNIQUE: Multiplanar, multiecho pulse sequences of the brain and surrounding structures were obtained without and with intravenous contrast. CONTRAST:  8mL GADAVIST  GADOBUTROL  1 MMOL/ML IV SOLN COMPARISON:  Brain MRI 07/31/2023.  PET CT 11/03/2023. FINDINGS: Brain: No age-advanced or lobar predominant cerebral atrophy. 11 x 4 mm dural-based enhancing mass along the mid-to-posterior left frontal convexity, unchanged in size from the prior MRI of 07/31/2023 and likely reflecting a meningioma. No mass effect upon the underlying brain parenchyma. No underlying parenchymal edema. No enhancing intracranial lesions identified elsewhere. Multifocal T2 FLAIR hyperintense signal abnormality within the cerebral white matter, mild-to-moderate for age. Findings are nonspecific, but compatible chronic small vessel ischemic disease. There is no acute infarct. No chronic intracranial blood products. No extra-axial fluid collection. No midline shift. Vascular: Maintained flow voids within the proximal large arterial vessels. Skull and upper cervical spine: No focal worrisome marrow lesion. Sinuses/Orbits: No mass or acute finding within the imaged orbits. No significant paranasal sinus disease. IMPRESSION: 1. No evidence of intracranial metastatic disease. 2. 11 x 4 mm dural-based mass along the left frontal convexity, unchanged in size from the prior MRI of 07/31/2023 and likely reflecting a meningioma. No mass effect upon the underlying brain parenchyma. 3. Chronic small vessel ischemic changes within the cerebral white matter, mild-to-moderate for age. Electronically Signed   By: Bascom Lily D.O.    On: 01/23/2024 16:18   DG Chest Port 1 View Result Date: 12/26/2023 CLINICAL DATA:  Status post bronchoscopy with biopsy.  Chest pain. EXAM: PORTABLE CHEST 1 VIEW COMPARISON:  June 25, 2022. FINDINGS: The heart size and mediastinal contours are within normal limits. Stable right apical pleural thickening or scarring. No acute pulmonary disease is noted. The visualized skeletal structures are unremarkable. IMPRESSION: No active disease. Electronically Signed   By: Rosalene Colon M.D.   On: 12/26/2023 10:16   DG C-ARM BRONCHOSCOPY Result Date: 12/26/2023 C-ARM BRONCHOSCOPY: Fluoroscopy was utilized by the requesting physician.  No radiographic interpretation.    ASSESSMENT AND PLAN: This is a very pleasant 68 years old white male with  1) stage Ia (T1c, N0, M0) small cell lung cancer presented with left lower lobe lung nodule diagnosed in May 2025. 2) History of stage IA (T1c, N0, M0) non-small cell lung cancer, squamous cell carcinoma presented  with right upper lobe in July 2023 status post right upper lobectomy with lymph node sampling under the care of Dr. Deloise Ferries on 05/10/2022 with tumor size of 2.3 cm.  He had repeat PET scan performed recently that showed hypermetabolic left lower lobe lung nodule with no other evidence of metastatic disease to the mediastinal lymph node or distant metastasis. He had repeat bronchoscopy with biopsy of the left lower lobe lung nodule and unfortunately the final pathology was consistent with a small cell carcinoma. He is here today to start systemic chemotherapy with carboplatin for AUC of 5 on day 1 and etoposide 100 Mg/M2 on days 1, 2 and 3 every 3 weeks for 4 cycles concurrent with SBRT to the left lower lobe lung nodule. Assessment and Plan    stage IA (T1c, N0, M0) small cell lung cancer presented with left lower lobe lung nodule diagnosed in May 2025.  - He is here today to start Systemic chemotherapy with carboplatin for AUC of 5 on day 1 and oh  etoposide 100 Mg/M2 on days 1, 2 and 3 every 3 weeks.  First dose 01/24/2024.  This is concurrent with SBRT to the left lower lobe lung nodule under the care of Dr. Jeryl Moris.    Radiation therapy completed Radiation therapy completed with three sessions instead of five. No adverse effects such as nausea, vomiting, diarrhea, chest pain, or dyspnea. No significant weight loss. Blood work is satisfactory for further treatment. Anticipate possible post-treatment fatigue. - Proceed with treatment as planned - Monitor for fatigue and other side effects post-treatment  Meningioma MRI reveals an 11x4 mm dural-based mass consistent with a likely benign meningioma. Located in the brain covering, not intracerebral. No intracranial metastatic disease. Most meningiomas are benign and asymptomatic, often requiring no intervention. - Monitor the meningioma with regular follow-up   He was advised to call immediately if he has any concerning symptoms in the interval. The patient voices understanding of current disease status and treatment options and is in agreement with the current care plan.  All questions were answered. The patient knows to call the clinic with any problems, questions or concerns. We can certainly see the patient much sooner if necessary.  The total time spent in the appointment was 30 minutes.  Disclaimer: This note was dictated with voice recognition software. Similar sounding words can inadvertently be transcribed and may not be corrected upon review.

## 2024-01-25 ENCOUNTER — Inpatient Hospital Stay

## 2024-01-25 ENCOUNTER — Ambulatory Visit: Admitting: Radiation Oncology

## 2024-01-25 VITALS — BP 120/54 | HR 70 | Temp 97.9°F | Resp 16

## 2024-01-25 DIAGNOSIS — C3432 Malignant neoplasm of lower lobe, left bronchus or lung: Secondary | ICD-10-CM

## 2024-01-25 MED ORDER — DEXAMETHASONE SODIUM PHOSPHATE 10 MG/ML IJ SOLN
10.0000 mg | Freq: Once | INTRAMUSCULAR | Status: AC
  Administered 2024-01-25: 10 mg via INTRAVENOUS
  Filled 2024-01-25: qty 1

## 2024-01-25 MED ORDER — SODIUM CHLORIDE 0.9 % IV SOLN: INTRAVENOUS | Status: DC

## 2024-01-25 MED ORDER — SODIUM CHLORIDE 0.9 % IV SOLN
100.0000 mg/m2 | Freq: Once | INTRAVENOUS | Status: AC
  Administered 2024-01-25: 200 mg via INTRAVENOUS
  Filled 2024-01-25: qty 10

## 2024-01-25 NOTE — Patient Instructions (Signed)
 [facility] - A DEPT OF [facility]  Discharge Instructions: Thank you for choosing [facility] to provide your oncology and hematology care.   If you have a lab appointment with the Cancer Center, please go directly to the Cancer Center and check in at the registration area.   Wear comfortable clothing and clothing appropriate for easy access to any Portacath or PICC line.   We strive to give you quality time with your provider. You may need to reschedule your appointment if you arrive late (15 or more minutes).  Arriving late affects you and other patients whose appointments are after yours.  Also, if you miss three or more appointments without notifying the office, you may be dismissed from the clinic at the provider's discretion.      For prescription refill requests, have your pharmacy contact our office and allow 72 hours for refills to be completed.    Today you received the following chemotherapy and/or immunotherapy agents: etoposide      To help prevent nausea and vomiting after your treatment, we encourage you to take your nausea medication as directed.  BELOW ARE SYMPTOMS THAT SHOULD BE REPORTED IMMEDIATELY: *FEVER GREATER THAN 100.4 F (38 C) OR HIGHER *CHILLS OR SWEATING *NAUSEA AND VOMITING THAT IS NOT CONTROLLED WITH YOUR NAUSEA MEDICATION *UNUSUAL SHORTNESS OF BREATH *UNUSUAL BRUISING OR BLEEDING *URINARY PROBLEMS (pain or burning when urinating, or frequent urination) *BOWEL PROBLEMS (unusual diarrhea, constipation, pain near the anus) TENDERNESS IN MOUTH AND THROAT WITH OR WITHOUT PRESENCE OF ULCERS (sore throat, sores in mouth, or a toothache) UNUSUAL RASH, SWELLING OR PAIN  UNUSUAL VAGINAL DISCHARGE OR ITCHING   Items with * indicate a potential emergency and should be followed up as soon as possible or go to the Emergency Department if any problems should occur.  Please show the CHEMOTHERAPY ALERT CARD or IMMUNOTHERAPY  ALERT CARD at check-in to the Emergency Department and triage nurse.  Should you have questions after your visit or need to cancel or reschedule your appointment, please contact [facility] - A DEPT OF [facility]  Dept: [phone]  and follow the prompts.  Office hours are 8:00 a.m. to 4:30 p.m. Monday - Friday. Please note that voicemails left after 4:00 p.m. may not be returned until the following business day.  We are closed weekends and major holidays. You have access to a nurse at all times for urgent questions. Please call the main number to the clinic Dept: [phone] and follow the prompts.   For any non-urgent questions, you may also contact your provider using MyChart. We now offer e-Visits for anyone 40 and older to request care online for non-urgent symptoms. For details visit [email or web address].   Also download the MyChart app! Go to the app store, search "MyChart", open the app, select [place], and log in with your MyChart username and password.

## 2024-01-26 ENCOUNTER — Inpatient Hospital Stay

## 2024-01-26 VITALS — BP 125/74 | HR 64 | Temp 98.2°F | Resp 20

## 2024-01-26 DIAGNOSIS — C3432 Malignant neoplasm of lower lobe, left bronchus or lung: Secondary | ICD-10-CM

## 2024-01-26 MED ORDER — SODIUM CHLORIDE 0.9 % IV SOLN: INTRAVENOUS | Status: DC

## 2024-01-26 MED ORDER — SODIUM CHLORIDE 0.9 % IV SOLN
100.0000 mg/m2 | Freq: Once | INTRAVENOUS | Status: AC
  Administered 2024-01-26: 200 mg via INTRAVENOUS
  Filled 2024-01-26: qty 10

## 2024-01-26 MED ORDER — DEXAMETHASONE SODIUM PHOSPHATE 10 MG/ML IJ SOLN
10.0000 mg | Freq: Once | INTRAMUSCULAR | Status: AC
  Administered 2024-01-26: 10 mg via INTRAVENOUS
  Filled 2024-01-26: qty 1

## 2024-01-27 ENCOUNTER — Encounter: Payer: Self-pay | Admitting: Internal Medicine

## 2024-01-27 ENCOUNTER — Ambulatory Visit: Admitting: Radiation Oncology

## 2024-01-27 NOTE — Telephone Encounter (Signed)
 Called pt to see how he did with his recent treatment & he reports doing well & denies any problems/concerns.  Bowels moved today & he knows his appts & how to reach us  if needed.

## 2024-01-27 NOTE — Telephone Encounter (Signed)
-----   Message from Nurse Brigido Canales sent at 01/24/2024  2:37 PM EDT ----- Regarding: Dr. Marguerita Shih 1st tx f/u call Dr. Marguerita Shih 1st tx f/u call. Carbo/Etoposide  - tolerated well

## 2024-01-29 ENCOUNTER — Other Ambulatory Visit (HOSPITAL_BASED_OUTPATIENT_CLINIC_OR_DEPARTMENT_OTHER): Payer: Self-pay | Admitting: Internal Medicine

## 2024-01-30 ENCOUNTER — Other Ambulatory Visit: Payer: Self-pay | Admitting: Medical Oncology

## 2024-01-30 ENCOUNTER — Inpatient Hospital Stay

## 2024-01-30 DIAGNOSIS — C3432 Malignant neoplasm of lower lobe, left bronchus or lung: Secondary | ICD-10-CM

## 2024-01-30 NOTE — Progress Notes (Signed)
 cbc

## 2024-01-31 ENCOUNTER — Inpatient Hospital Stay: Admitting: Internal Medicine

## 2024-01-31 ENCOUNTER — Inpatient Hospital Stay

## 2024-01-31 VITALS — BP 138/70 | HR 81 | Temp 98.3°F | Resp 17 | Ht 68.0 in | Wt 182.6 lb

## 2024-01-31 DIAGNOSIS — C3432 Malignant neoplasm of lower lobe, left bronchus or lung: Secondary | ICD-10-CM

## 2024-01-31 LAB — CMP (CANCER CENTER ONLY)
ALT: 17 U/L (ref 0–44)
AST: 17 U/L (ref 15–41)
Albumin: 4.6 g/dL (ref 3.5–5.0)
Alkaline Phosphatase: 56 U/L (ref 38–126)
Anion gap: 8 (ref 5–15)
BUN: 26 mg/dL — ABNORMAL HIGH (ref 8–23)
CO2: 24 mmol/L (ref 22–32)
Calcium: 9.6 mg/dL (ref 8.9–10.3)
Chloride: 104 mmol/L (ref 98–111)
Creatinine: 1.06 mg/dL (ref 0.61–1.24)
GFR, Estimated: >60 mL/min (ref >60)
Glucose, Bld: 155 mg/dL — ABNORMAL HIGH (ref 70–99)
Potassium: 4.2 mmol/L (ref 3.5–5.1)
Sodium: 136 mmol/L (ref 135–145)
Total Bilirubin: 0.5 mg/dL (ref 0.0–1.2)
Total Protein: 7.3 g/dL (ref 6.5–8.1)

## 2024-01-31 LAB — CBC WITH DIFFERENTIAL (CANCER CENTER ONLY)
Abs Immature Granulocytes: 0.13 10*3/uL — ABNORMAL HIGH (ref 0.00–0.07)
Basophils Absolute: 0.1 10*3/uL (ref 0.0–0.1)
Basophils Relative: 1 %
Eosinophils Absolute: 0.1 10*3/uL (ref 0.0–0.5)
Eosinophils Relative: 1 %
HCT: 38.8 % — ABNORMAL LOW (ref 39.0–52.0)
Hemoglobin: 13.9 g/dL (ref 13.0–17.0)
Immature Granulocytes: 2 %
Lymphocytes Relative: 19 %
Lymphs Abs: 1 10*3/uL (ref 0.7–4.0)
MCH: 32.2 pg (ref 26.0–34.0)
MCHC: 35.8 g/dL (ref 30.0–36.0)
MCV: 89.8 fL (ref 80.0–100.0)
Monocytes Absolute: 0 10*3/uL — ABNORMAL LOW (ref 0.1–1.0)
Monocytes Relative: 0 %
Neutro Abs: 4.2 10*3/uL (ref 1.7–7.7)
Neutrophils Relative %: 77 %
Platelet Count: 142 10*3/uL — ABNORMAL LOW (ref 150–400)
RBC: 4.32 MIL/uL (ref 4.22–5.81)
RDW: 12 % (ref 11.5–15.5)
Smear Review: NORMAL
WBC Count: 5.6 10*3/uL (ref 4.0–10.5)
nRBC: 0 % (ref 0.0–0.2)

## 2024-01-31 NOTE — Progress Notes (Signed)
 [facility] [phone]   [phone]  OFFICE PROGRESS NOTE  Rodney Clamp, MD [address] [place] [address]  DIAGNOSIS:   1) stage IA (T1c, N0, M0) small cell lung cancer presented with left lower lobe lung nodule diagnosed in May 2025.  2) Stage IA (T1c, N0, M0) non-small cell lung cancer, squamous cell carcinoma presented with right upper lobe in July 2023   PRIOR THERAPY: Status post right upper lobectomy with lymph node sampling under the care of Dr. Deloise Ferries on 05/10/2022 with tumor size of 2.3 cm.   CURRENT THERAPY: Systemic chemotherapy with carboplatin  for AUC of 5 on day 1 and oh etoposide  100 Mg/M2 on days 1, 2 and 3 every 3 weeks.  First dose 01/23/2024.  This is concurrent with SBRT to the left lower lobe lung nodule under the care of Dr. Jeryl Moris.  It is post 1 cycle.  INTERVAL HISTORY: Gregory Jensen 68 y.o. male returns to the clinic today for follow-up visit. Discussed the use of AI scribe software for clinical note transcription with the patient, who gave verbal consent to proceed.  History of Present Illness   Gregory Jensen is a 68 year old male with limited stage small cell lung cancer who presents for evaluation and management of treatment-related adverse effects.   He is currently undergoing systemic chemotherapy with carboplatin  and etoposide , having completed one cycle starting on January 23, 2024. He also received stereotactic body radiation therapy (SBRT) to the lesion in the left lower lobe. No significant drop in lab work after the first week of treatment. No nausea, vomiting, diarrhea, or significant weight loss reported. Good appetite maintained.  He experiences shoulder pain, described as muscular, located between his neck and shoulder, which started on Sunday. The pain is intermittent, sometimes becoming severe, and occasionally radiates down to his arm. He has not taken any medication for this pain yet.  He  notes worsening neuropathy following the first chemotherapy session, which was already present prior to treatment. He has not been prescribed any medication for neuropathy by his neurologist.  No changes in taste or hair loss at this time. No nausea, vomiting, diarrhea, or significant weight loss, although he mentions a minor weight loss of one to two pounds.        MEDICAL HISTORY: Past Medical History:  Diagnosis Date   Cancer (HCC) 4 years ago   Prostate Cancer , Lung Cancer   Diabetes mellitus without complication (HCC)    type 2   Hyperlipidemia    Hypertension    Lung cancer (HCC)    Left Lower Lobe Lung   Neuromuscular disorder (HCC)    neuropathy   Neuropathy    in legs causing balance issues per patient   Sleep apnea    Smoker     ALLERGIES:  is allergic to jardiance  [empagliflozin ].  MEDICATIONS:  Current Outpatient Medications  Medication Sig Dispense Refill   aspirin  81 MG EC tablet Take 81 mg by mouth in the morning.     atorvastatin  (LIPITOR) 40 MG tablet TAKE 1 TABLET BY MOUTH EVERY DAY 90 tablet 0   Blood Glucose Monitoring Suppl (TRUE METRIX METER) w/Device KIT USE AS DIRECTED 1 kit 0   cyanocobalamin  1000 MCG tablet Take 1,000 mcg by mouth daily.     fenofibrate  (TRICOR ) 145 MG tablet Take 1 tablet (145 mg total) by mouth daily. 90 tablet 0   glucose blood (ONETOUCH VERIO) test strip  1 each by Other route daily as needed for other. Use as instructed 100 each 4   ibuprofen (ADVIL) 200 MG tablet Take 400-600 mg by mouth daily as needed (pain.).     losartan  (COZAAR ) 50 MG tablet TAKE 1 TABLET BY MOUTH EVERY DAY 90 tablet 1   metFORMIN  (GLUCOPHAGE ) 500 MG tablet TAKE 1 TABLET BY MOUTH TWICE A DAY WITH FOOD 180 tablet 1   ondansetron  (ZOFRAN ) 8 MG tablet Take 1 tablet (8 mg total) by mouth every 8 (eight) hours as needed for nausea or vomiting. Start on third day after chemotherapy. 30 tablet 1   OneTouch Delica Lancets 33G MISC 1 each by Does not apply route  daily as needed. 100 each 4   prochlorperazine  (COMPAZINE ) 10 MG tablet Take 1 tablet (10 mg total) by mouth every 6 (six) hours as needed for nausea or vomiting (Nausea or vomiting). 30 tablet 1   No current facility-administered medications for this visit.    SURGICAL HISTORY:  Past Surgical History:  Procedure Laterality Date   BACK SURGERY  15 years ago   BRONCHIAL BIOPSY  05/10/2022   Procedure: BRONCHIAL BIOPSIES;  Surgeon: Denson Flake, MD;  Location[facility] ENDOSCOPY;  Service: Pulmonary;;   BRONCHIAL BIOPSY  12/26/2023   Procedure: BRONCHOSCOPY, WITH BIOPSY;  Surgeon: Denson Flake, MD;  Location: [facility] ENDOSCOPY;  Service: Pulmonary;;   BRONCHIAL BRUSHINGS  05/10/2022   Procedure: BRONCHIAL BRUSHINGS;  Surgeon: Denson Flake, MD;  Location: [facility] ENDOSCOPY;  Service: Pulmonary;;   BRONCHIAL BRUSHINGS  12/26/2023   Procedure: BRONCHOSCOPY, WITH BRUSH BIOPSY;  Surgeon: Denson Flake, MD;  Location: [facility] ENDOSCOPY;  Service: Pulmonary;;   BRONCHIAL NEEDLE ASPIRATION BIOPSY  05/10/2022   Procedure: BRONCHIAL NEEDLE ASPIRATION BIOPSIES;  Surgeon: Denson Flake, MD;  Location: [facility] ENDOSCOPY;  Service: Pulmonary;;   BRONCHIAL NEEDLE ASPIRATION BIOPSY  12/26/2023   Procedure: BRONCHOSCOPY, WITH NEEDLE ASPIRATION BIOPSY;  Surgeon: Denson Flake, MD;  Location: [facility] ENDOSCOPY;  Service: Pulmonary;;   COLONOSCOPY  2007   pt does not know MD name/normal exam per pt.   FIDUCIAL MARKER PLACEMENT  05/10/2022   Procedure: FIDUCIAL DYE MARKING;  Surgeon: Denson Flake, MD;  Location: [facility] ENDOSCOPY;  Service: Pulmonary;;   FIDUCIAL MARKER PLACEMENT  12/26/2023   Procedure: INSERTION, FIDUCIAL MARKERS;  Surgeon: Denson Flake, MD;  Location: [facility] ENDOSCOPY;  Service: Pulmonary;;   INTERCOSTAL NERVE BLOCK Right 05/10/2022   Procedure: INTERCOSTAL NERVE BLOCK;  Surgeon: Hilarie Lovely, MD;  Location: [facility] OR;  Service: Thoracic;  Laterality: Right;   NODE DISSECTION Right 05/10/2022   Procedure:  NODE DISSECTION;  Surgeon: Hilarie Lovely, MD;  Location: [facility] OR;  Service: Thoracic;  Laterality: Right;   PROSTATECTOMY  4 years ago   VIDEO BRONCHOSCOPY WITH ENDOBRONCHIAL NAVIGATION Left 12/26/2023   Procedure: VIDEO BRONCHOSCOPY WITH ENDOBRONCHIAL NAVIGATION;  Surgeon: Denson Flake, MD;  Location: [facility] ENDOSCOPY;  Service: Pulmonary;  Laterality: Left;    REVIEW OF SYSTEMS:  A comprehensive review of systems was negative except for: Constitutional: positive for fatigue Musculoskeletal: positive for arthralgias   PHYSICAL EXAMINATION: General appearance: alert, cooperative, fatigued, and no distress Head: Normocephalic, without obvious abnormality, atraumatic Neck: no adenopathy, no JVD, supple, symmetrical, trachea midline, and thyroid  not enlarged, symmetric, no tenderness/mass/nodules Lymph nodes: Cervical, supraclavicular, and axillary nodes normal. Resp: clear to auscultation bilaterally Back: symmetric, no curvature. ROM normal. No CVA tenderness. Cardio: regular rate and rhythm, S1, S2 normal, no murmur, click, rub  or gallop GI: soft, non-tender; bowel sounds normal; no masses,  no organomegaly Extremities: extremities normal, atraumatic, no cyanosis or edema  ECOG PERFORMANCE STATUS: 1 - Symptomatic but completely ambulatory  Blood pressure 138/70, pulse 81, temperature 98.3 F (36.8 C), temperature source Oral, resp. rate 17, height 5' 8 (1.727 m), weight 182 lb 9.6 oz (82.8 kg), SpO2 100%.  LABORATORY DATA: Lab Results  Component Value Date   WBC 5.6 01/31/2024   HGB 13.9 01/31/2024   HCT 38.8 (L) 01/31/2024   MCV 89.8 01/31/2024   PLT 142 (L) 01/31/2024      Chemistry      Component Value Date/Time   NA 136 01/31/2024 1049   NA 138 04/27/2022 0847   K 4.2 01/31/2024 1049   CL 104 01/31/2024 1049   CO2 24 01/31/2024 1049   BUN 26 (H) 01/31/2024 1049   BUN 19 04/27/2022 0847   CREATININE 1.06 01/31/2024 1049   CREATININE 0.97 07/23/2020 0827       Component Value Date/Time   CALCIUM  9.6 01/31/2024 1049   ALKPHOS 56 01/31/2024 1049   AST 17 01/31/2024 1049   ALT 17 01/31/2024 1049   BILITOT 0.5 01/31/2024 1049       RADIOGRAPHIC STUDIES: MR BRAIN W WO CONTRAST Result Date: 01/23/2024 CLINICAL DATA:  Provided history: Malignant neoplasm of unspecified part of unspecified bronchus or lung. Small cell lung cancer, staging. EXAM: MRI HEAD WITHOUT AND WITH CONTRAST TECHNIQUE: Multiplanar, multiecho pulse sequences of the brain and surrounding structures were obtained without and with intravenous contrast. CONTRAST:  8mL GADAVIST  GADOBUTROL  1 MMOL/ML IV SOLN COMPARISON:  Brain MRI 07/31/2023.  PET CT 11/03/2023. FINDINGS: Brain: No age-advanced or lobar predominant cerebral atrophy. 11 x 4 mm dural-based enhancing mass along the mid-to-posterior left frontal convexity, unchanged in size from the prior MRI of 07/31/2023 and likely reflecting a meningioma. No mass effect upon the underlying brain parenchyma. No underlying parenchymal edema. No enhancing intracranial lesions identified elsewhere. Multifocal T2 FLAIR hyperintense signal abnormality within the cerebral white matter, mild-to-moderate for age. Findings are nonspecific, but compatible chronic small vessel ischemic disease. There is no acute infarct. No chronic intracranial blood products. No extra-axial fluid collection. No midline shift. Vascular: Maintained flow voids within the proximal large arterial vessels. Skull and upper cervical spine: No focal worrisome marrow lesion. Sinuses/Orbits: No mass or acute finding within the imaged orbits. No significant paranasal sinus disease. IMPRESSION: 1. No evidence of intracranial metastatic disease. 2. 11 x 4 mm dural-based mass along the left frontal convexity, unchanged in size from the prior MRI of 07/31/2023 and likely reflecting a meningioma. No mass effect upon the underlying brain parenchyma. 3. Chronic small vessel ischemic changes within  the cerebral white matter, mild-to-moderate for age. Electronically Signed   By: Bascom Lily D.O.   On: 01/23/2024 16:18    ASSESSMENT AND PLAN: This is a very pleasant 68 years old white male with  1) stage Ia (T1c, N0, M0) small cell lung cancer presented with left lower lobe lung nodule diagnosed in May 2025. 2) History of stage IA (T1c, N0, M0) non-small cell lung cancer, squamous cell carcinoma presented with right upper lobe in July 2023 status post right upper lobectomy with lymph node sampling under the care of Dr. Deloise Ferries on 05/10/2022 with tumor size of 2.3 cm.  He had repeat PET scan performed recently that showed hypermetabolic left lower lobe lung nodule with no other evidence of metastatic disease to the mediastinal lymph node or  distant metastasis. He had repeat bronchoscopy with biopsy of the left lower lobe lung nodule and unfortunately the final pathology was consistent with a small cell carcinoma. He started systemic chemotherapy with carboplatin  for AUC of 5 on day 1 and etoposide  100 Mg/M2 on days 1, 2 and 3 every 3 weeks for 4 cycles concurrent with SBRT to the left lower lobe lung nodule.  First dose was 01/23/2024 He is status post 1 cycle of treatment. Assessment and Plan    Limited stage small cell lung cancer Limited stage small cell lung cancer diagnosed in May 2025. Currently undergoing systemic chemotherapy with carboplatin  and Etoposide , post one cycle started on 01-23-2024. Underwent SBRT to the lesion in the left lower lobe. No significant drop in lab work in the first week. No nausea, vomiting, diarrhea, or significant weight loss reported. Good appetite and no hair loss yet. - Continue systemic chemotherapy with carboplatin  and Etoposide  - Perform weekly lab work to monitor for adverse effects  Chemotherapy-induced neuropathy Worsening neuropathy after the first cycle of chemotherapy. Pre-existing neuropathy was present before treatment. Discussed potential use of  Lyrica or gabapentin if neuropathy worsens. - Consider starting Lyrica or gabapentin if neuropathy worsens  Shoulder pain Intermittent shoulder pain located between the neck and shoulder, extending to the arm. Described as muscular and possibly due to arthritis or other musculoskeletal issues, not related to chemotherapy. - Recommend taking Tylenol  or ibuprofen as needed for pain - Suggest using a lidocaine  patch on the affected area   The patient voices understanding of current disease status and treatment options and is in agreement with the current care plan.  All questions were answered. The patient knows to call the clinic with any problems, questions or concerns. We can certainly see the patient much sooner if necessary.  The total time spent in the appointment was 20 minutes.  Disclaimer: This note was dictated with voice recognition software. Similar sounding words can inadvertently be transcribed and may not be corrected upon review.

## 2024-02-06 ENCOUNTER — Telehealth: Payer: Self-pay

## 2024-02-06 ENCOUNTER — Other Ambulatory Visit: Payer: Self-pay | Admitting: Internal Medicine

## 2024-02-06 ENCOUNTER — Inpatient Hospital Stay

## 2024-02-06 DIAGNOSIS — C3432 Malignant neoplasm of lower lobe, left bronchus or lung: Secondary | ICD-10-CM | POA: Diagnosis not present

## 2024-02-06 DIAGNOSIS — T451X5A Adverse effect of antineoplastic and immunosuppressive drugs, initial encounter: Secondary | ICD-10-CM | POA: Insufficient documentation

## 2024-02-06 DIAGNOSIS — E119 Type 2 diabetes mellitus without complications: Secondary | ICD-10-CM

## 2024-02-06 LAB — CBC WITH DIFFERENTIAL (CANCER CENTER ONLY)
Abs Immature Granulocytes: 0.02 10*3/uL (ref 0.00–0.07)
Basophils Absolute: 0 10*3/uL (ref 0.0–0.1)
Basophils Relative: 2 %
Eosinophils Absolute: 0 10*3/uL (ref 0.0–0.5)
Eosinophils Relative: 2 %
HCT: 34.7 % — ABNORMAL LOW (ref 39.0–52.0)
Hemoglobin: 12.2 g/dL — ABNORMAL LOW (ref 13.0–17.0)
Immature Granulocytes: 1 %
Lymphocytes Relative: 72 %
Lymphs Abs: 1.1 10*3/uL (ref 0.7–4.0)
MCH: 31.8 pg (ref 26.0–34.0)
MCHC: 35.2 g/dL (ref 30.0–36.0)
MCV: 90.4 fL (ref 80.0–100.0)
Monocytes Absolute: 0.2 10*3/uL (ref 0.1–1.0)
Monocytes Relative: 14 %
Neutro Abs: 0.1 10*3/uL — CL (ref 1.7–7.7)
Neutrophils Relative %: 9 %
Platelet Count: 61 10*3/uL — ABNORMAL LOW (ref 150–400)
RBC: 3.84 MIL/uL — ABNORMAL LOW (ref 4.22–5.81)
RDW: 11.9 % (ref 11.5–15.5)
Smear Review: DECREASED
WBC Count: 1.5 10*3/uL — ABNORMAL LOW (ref 4.0–10.5)
nRBC: 0 % (ref 0.0–0.2)

## 2024-02-06 LAB — CMP (CANCER CENTER ONLY)
ALT: 21 U/L (ref 0–44)
AST: 17 U/L (ref 15–41)
Albumin: 4.6 g/dL (ref 3.5–5.0)
Alkaline Phosphatase: 53 U/L (ref 38–126)
Anion gap: 8 (ref 5–15)
BUN: 18 mg/dL (ref 8–23)
CO2: 24 mmol/L (ref 22–32)
Calcium: 9.7 mg/dL (ref 8.9–10.3)
Chloride: 109 mmol/L (ref 98–111)
Creatinine: 1.05 mg/dL (ref 0.61–1.24)
GFR, Estimated: >60 mL/min (ref >60)
Glucose, Bld: 126 mg/dL — ABNORMAL HIGH (ref 70–99)
Potassium: 4.4 mmol/L (ref 3.5–5.1)
Sodium: 141 mmol/L (ref 135–145)
Total Bilirubin: 0.3 mg/dL (ref 0.0–1.2)
Total Protein: 7.1 g/dL (ref 6.5–8.1)

## 2024-02-06 MED ORDER — OXYCODONE-ACETAMINOPHEN 5-325 MG PO TABS: 1.0000 | ORAL_TABLET | Freq: Three times a day (TID) | ORAL | 0 refills | Status: DC | PRN

## 2024-02-06 NOTE — Telephone Encounter (Signed)
 Spoke with patient this afternoon regarding lab results. Per Dr. Sherrod, patient's ANC is 0.1 and Zarxio injections are indicated. Educated patient on neutropenic precautions, use of Claritin for bone pain prevention, Tylenol  use for discomfort, and potential side effects of Zarxio.  Scheduled appointments for Zarxio injections over the next three days. Informed patient that if prior authorization is not confirmed by tomorrow's appointment, the injection will need to be canceled. Advised patient that I will call to notify him once the injection has been approved. Patient voiced understanding.

## 2024-02-06 NOTE — Telephone Encounter (Signed)
 Spoke with patient this morning regarding complaints of muscle/nerve pain in his neck to shoulder to back of left arm at the bicep. Patient reports that he saw Dr. Sherrod on 01/31/24 and discussed the pain at that visit. Per Dr. Jeannett note, he recommended the use of Tylenol  or ibuprofen as needed, along with lidocaine  patches applied to the affected area.  Patient states he has been taking Advil and using lidocaine  patches but has experienced no relief. He currently rates his pain as a 6/10 and reports that it is disrupting his sleep. Dr. Sherrod has recommended Percocet to assess pain relief. Patient was informed of this plan and advised to call the office with any new or worsening symptoms. Patient voiced understanding and expressed thanks.

## 2024-02-07 ENCOUNTER — Inpatient Hospital Stay

## 2024-02-07 ENCOUNTER — Encounter: Payer: Self-pay | Admitting: Internal Medicine

## 2024-02-07 VITALS — BP 131/78 | HR 82 | Temp 98.6°F | Resp 19

## 2024-02-07 DIAGNOSIS — C3432 Malignant neoplasm of lower lobe, left bronchus or lung: Secondary | ICD-10-CM | POA: Diagnosis not present

## 2024-02-07 DIAGNOSIS — T451X5A Adverse effect of antineoplastic and immunosuppressive drugs, initial encounter: Secondary | ICD-10-CM

## 2024-02-07 MED ORDER — FILGRASTIM-SNDZ 480 MCG/0.8ML IJ SOSY
480.0000 ug | PREFILLED_SYRINGE | Freq: Every day | INTRAMUSCULAR | Status: DC
  Administered 2024-02-07: 480 ug via SUBCUTANEOUS
  Filled 2024-02-07: qty 0.8

## 2024-02-07 NOTE — Telephone Encounter (Signed)
 Spoke with patient today and informed him that the Zarxio injections for the next 3 days have been approved. Patient acknowledged and stated that he picked up the Percocet prescription from the pharmacy last night.

## 2024-02-08 ENCOUNTER — Other Ambulatory Visit: Payer: Self-pay | Admitting: Medical Oncology

## 2024-02-08 ENCOUNTER — Encounter: Payer: Self-pay | Admitting: Internal Medicine

## 2024-02-08 ENCOUNTER — Inpatient Hospital Stay

## 2024-02-08 VITALS — BP 137/66 | HR 86 | Temp 98.2°F | Resp 18

## 2024-02-08 DIAGNOSIS — C3432 Malignant neoplasm of lower lobe, left bronchus or lung: Secondary | ICD-10-CM | POA: Diagnosis not present

## 2024-02-08 DIAGNOSIS — D701 Agranulocytosis secondary to cancer chemotherapy: Secondary | ICD-10-CM

## 2024-02-08 MED ORDER — FILGRASTIM-SNDZ 480 MCG/0.8ML IJ SOSY
480.0000 ug | PREFILLED_SYRINGE | Freq: Once | INTRAMUSCULAR | Status: AC
  Administered 2024-02-08: 480 ug via SUBCUTANEOUS
  Filled 2024-02-08: qty 0.8

## 2024-02-08 NOTE — Progress Notes (Signed)
 Gcsf ordered

## 2024-02-09 ENCOUNTER — Inpatient Hospital Stay

## 2024-02-09 VITALS — BP 143/71 | HR 93 | Temp 98.3°F | Resp 18

## 2024-02-09 DIAGNOSIS — C3432 Malignant neoplasm of lower lobe, left bronchus or lung: Secondary | ICD-10-CM | POA: Diagnosis not present

## 2024-02-09 DIAGNOSIS — T451X5A Adverse effect of antineoplastic and immunosuppressive drugs, initial encounter: Secondary | ICD-10-CM

## 2024-02-09 MED ORDER — FILGRASTIM-SNDZ 480 MCG/0.8ML IJ SOSY
480.0000 ug | PREFILLED_SYRINGE | Freq: Once | INTRAMUSCULAR | Status: AC
  Administered 2024-02-09: 480 ug via SUBCUTANEOUS
  Filled 2024-02-09: qty 0.8

## 2024-02-10 ENCOUNTER — Telehealth: Payer: Self-pay

## 2024-02-10 ENCOUNTER — Other Ambulatory Visit: Payer: Self-pay | Admitting: *Deleted

## 2024-02-10 ENCOUNTER — Encounter: Payer: Self-pay | Admitting: Internal Medicine

## 2024-02-10 ENCOUNTER — Other Ambulatory Visit (HOSPITAL_COMMUNITY): Payer: Self-pay

## 2024-02-10 MED ORDER — ONETOUCH ULTRA 2 W/DEVICE KIT: PACK | 0 refills | Status: AC

## 2024-02-10 MED FILL — Fosaprepitant Dimeglumine For IV Infusion 150 MG (Base Eq): INTRAVENOUS | Qty: 5 | Status: AC

## 2024-02-10 NOTE — Telephone Encounter (Signed)
 Rx send to pharmacy

## 2024-02-10 NOTE — Telephone Encounter (Signed)
 Pharmacy Patient Advocate Encounter   Received notification from CoverMyMeds that prior authorization for True Metrix Meter device is required/requested.   Insurance verification completed.   The patient is insured through [facility] [employer]/RX [employer]VANCE .   Per test claim:  Gregory Jensen is preferred by the insurance.  If suggested medication is appropriate, Please send in a new RX and discontinue this one. If not, please advise as to why it's not appropriate so that we may request a Prior Authorization. Please note, some preferred medications may still require a PA.  If the suggested medications have not been trialed and there are no contraindications to their use, the PA will not be submitted, as it will not be approved.

## 2024-02-10 NOTE — Progress Notes (Unsigned)
 [facility] OFFICE PROGRESS NOTE  [name], MD [address] [place] [address]  DIAGNOSIS: 1) stage IA (T1c, N0, M0) small cell lung cancer presented with left lower lobe lung nodule diagnosed in May 2025.  2) Stage IA (T1c, N0, M0) non-small cell lung cancer, squamous cell carcinoma presented with right upper lobe in July 2023   PRIOR THERAPY:  1) Status post right upper lobectomy with lymph node sampling under the care of Dr. Shyrl on 05/10/2022 with tumor size of 2.3 cm.  2) SBRT to the left lower lobe under the care of Dr. Dewey.  Completed on 01/23/24  CURRENT THERAPY: Systemic chemotherapy with carboplatin  for AUC of 5 on day 1 and etoposide  100 Mg/M2 on days 1, 2 and 3 every 3 weeks.  First dose 01/23/2024.  This is concurrent with SBRT to the left lower lobe lung nodule under the care of Dr. Dewey.  It is post 1 cycle.   INTERVAL HISTORY: Gregory Jensen 68 y.o. male returns to the clinic today for follow-up visit.  The patient was last seen in the clinic by Dr. Sherrod on 01/31/2024.  The patient is currently undergoing systemic chemotherapy.  The patient had significant neutropenia with cycle #1 which required G-CSF injections with Zarzio the most recent being on 02/03/2024.  He denies any signs and symptoms of infection.  He denies any fever, chills, or night sweats.  Weight loss?  He denies any nausea, vomiting, diarrhea, or constipation.  His breathing is ****he was endorsing shoulder pain at his last appointment.  He is breathing is ***Feng's of breath?  Cough?  He denies any chest pain or hemoptysis.  He is here today for evaluation and repeat blood work before considering undergoing cycle #2.     Zarxio  most recetn on 02/07/24    MEDICAL HISTORY: Past Medical History:  Diagnosis Date   Cancer (HCC) 4 years ago   Prostate Cancer , Lung Cancer   Diabetes mellitus without complication (HCC)    type 2   Hyperlipidemia    Hypertension    Lung  cancer (HCC)    Left Lower Lobe Lung   Neuromuscular disorder (HCC)    neuropathy   Neuropathy    in legs causing balance issues per patient   Sleep apnea    Smoker     ALLERGIES:  is allergic to jardiance  [empagliflozin ].  MEDICATIONS:  Current Outpatient Medications  Medication Sig Dispense Refill   aspirin  81 MG EC tablet Take 81 mg by mouth in the morning.     atorvastatin  (LIPITOR) 40 MG tablet TAKE 1 TABLET BY MOUTH EVERY DAY 90 tablet 0   Blood Glucose Monitoring Suppl (TRUE METRIX METER) w/Device KIT USE AS DIRECTED 1 kit 0   cyanocobalamin  1000 MCG tablet Take 1,000 mcg by mouth daily.     fenofibrate  (TRICOR ) 145 MG tablet TAKE 1 TABLET BY MOUTH EVERY DAY 90 tablet 0   glucose blood (ONETOUCH VERIO) test strip 1 each by Other route daily as needed for other. Use as instructed 100 each 4   ibuprofen (ADVIL) 200 MG tablet Take 400-600 mg by mouth daily as needed (pain.).     losartan  (COZAAR ) 50 MG tablet TAKE 1 TABLET BY MOUTH EVERY DAY 90 tablet 0   metFORMIN  (GLUCOPHAGE ) 500 MG tablet TAKE 1 TABLET BY MOUTH TWICE A DAY WITH FOOD 180 tablet 1   ondansetron  (ZOFRAN ) 8 MG tablet Take 1 tablet (8 mg total) by mouth every  8 (eight) hours as needed for nausea or vomiting. Start on third day after chemotherapy. 30 tablet 1   OneTouch Delica Lancets 33G MISC 1 each by Does not apply route daily as needed. 100 each 4   oxyCODONE -acetaminophen  (PERCOCET/ROXICET) 5-325 MG tablet Take 1 tablet by mouth every 8 (eight) hours as needed for severe pain (pain score 7-10). 30 tablet 0   prochlorperazine  (COMPAZINE ) 10 MG tablet Take 1 tablet (10 mg total) by mouth every 6 (six) hours as needed for nausea or vomiting (Nausea or vomiting). 30 tablet 1   No current facility-administered medications for this visit.    SURGICAL HISTORY:  Past Surgical History:  Procedure Laterality Date   BACK SURGERY  15 years ago   BRONCHIAL BIOPSY  05/10/2022   Procedure: BRONCHIAL BIOPSIES;  Surgeon:  Shelah Lamar RAMAN, MD;  Location: [facility] ENDOSCOPY;  Service: Pulmonary;;   BRONCHIAL BIOPSY  12/26/2023   Procedure: BRONCHOSCOPY, WITH BIOPSY;  Surgeon: Shelah Lamar RAMAN, MD;  Location: [facility] ENDOSCOPY;  Service: Pulmonary;;   BRONCHIAL BRUSHINGS  05/10/2022   Procedure: BRONCHIAL BRUSHINGS;  Surgeon: Shelah Lamar RAMAN, MD;  Location: [facility] ENDOSCOPY;  Service: Pulmonary;;   BRONCHIAL BRUSHINGS  12/26/2023   Procedure: BRONCHOSCOPY, WITH BRUSH BIOPSY;  Surgeon: Shelah Lamar RAMAN, MD;  Location: [facility] ENDOSCOPY;  Service: Pulmonary;;   BRONCHIAL NEEDLE ASPIRATION BIOPSY  05/10/2022   Procedure: BRONCHIAL NEEDLE ASPIRATION BIOPSIES;  Surgeon: Shelah Lamar RAMAN, MD;  Location: [facility] ENDOSCOPY;  Service: Pulmonary;;   BRONCHIAL NEEDLE ASPIRATION BIOPSY  12/26/2023   Procedure: BRONCHOSCOPY, WITH NEEDLE ASPIRATION BIOPSY;  Surgeon: Shelah Lamar RAMAN, MD;  Location: [facility] ENDOSCOPY;  Service: Pulmonary;;   COLONOSCOPY  2007   pt does not know MD name/normal exam per pt.   FIDUCIAL MARKER PLACEMENT  05/10/2022   Procedure: FIDUCIAL DYE MARKING;  Surgeon: Shelah Lamar RAMAN, MD;  Location[facility]COPY;  Service: Pulmonary;;   FIDUCIAL MARKER PLACEMENT  12/26/2023   Procedure: INSERTION, FIDUCIAL MARKERS;  Surgeon: Shelah Lamar RAMAN, MD;  Location: [facility] ENDOSCOPY;  Service: Pulmonary;;   INTERCOSTAL NERVE BLOCK Right 05/10/2022   Procedure: INTERCOSTAL NERVE BLOCK;  Surgeon: Shyrl Linnie KIDD, MD;  Location: [facility] OR;  Service: Thoracic;  Laterality: Right;   NODE DISSECTION Right 05/10/2022   Procedure: NODE DISSECTION;  Surgeon: Shyrl Linnie KIDD, MD;  Location: [facility] OR;  Service: Thoracic;  Laterality: Right;   PROSTATECTOMY  4 years ago   VIDEO BRONCHOSCOPY WITH ENDOBRONCHIAL NAVIGATION Left 12/26/2023   Procedure: VIDEO BRONCHOSCOPY WITH ENDOBRONCHIAL NAVIGATION;  Surgeon: Shelah Lamar RAMAN, MD;  Location: [facility] ENDOSCOPY;  Service: Pulmonary;  Laterality: Left;    REVIEW OF SYSTEMS:   Review of Systems  Constitutional: Negative for  appetite change, chills, fatigue, fever and unexpected weight change.  HENT:   Negative for mouth sores, nosebleeds, sore throat and trouble swallowing.   Eyes: Negative for eye problems and icterus.  Respiratory: Negative for cough, hemoptysis, shortness of breath and wheezing.   Cardiovascular: Negative for chest pain and leg swelling.  Gastrointestinal: Negative for abdominal pain, constipation, diarrhea, nausea and vomiting.  Genitourinary: Negative for bladder incontinence, difficulty urinating, dysuria, frequency and hematuria.   Musculoskeletal: Negative for back pain, gait problem, neck pain and neck stiffness.  Skin: Negative for itching and rash.  Neurological: Negative for dizziness, extremity weakness, gait problem, headaches, light-headedness and seizures.  Hematological: Negative for adenopathy. Does not bruise/bleed easily.  Psychiatric/Behavioral: Negative for confusion, depression and sleep disturbance. The patient is not nervous/anxious.  PHYSICAL EXAMINATION:  There were no vitals taken for this visit.  ECOG PERFORMANCE STATUS: {CHL ONC ECOG [id number]  Physical Exam  Constitutional: Oriented to person, place, and time and well-developed, well-nourished, and in no distress. No distress.  HENT:  Head: Normocephalic and atraumatic.  Mouth/Throat: Oropharynx is clear and moist. No oropharyngeal exudate.  Eyes: Conjunctivae are normal. Right eye exhibits no discharge. Left eye exhibits no discharge. No scleral icterus.  Neck: Normal range of motion. Neck supple.  Cardiovascular: Normal rate, regular rhythm, normal heart sounds and intact distal pulses.   Pulmonary/Chest: Effort normal and breath sounds normal. No respiratory distress. No wheezes. No rales.  Abdominal: Soft. Bowel sounds are normal. Exhibits no distension and no mass. There is no tenderness.  Musculoskeletal: Normal range of motion. Exhibits no edema.  Lymphadenopathy:    No cervical adenopathy.   Neurological: Alert and oriented to person, place, and time. Exhibits normal muscle tone. Gait normal. Coordination normal.  Skin: Skin is warm and dry. No rash noted. Not diaphoretic. No erythema. No pallor.  Psychiatric: Mood, memory and judgment normal.  Vitals reviewed.  LABORATORY DATA: Lab Results  Component Value Date   WBC 1.5 (L) 02/06/2024   HGB 12.2 (L) 02/06/2024   HCT 34.7 (L) 02/06/2024   MCV 90.4 02/06/2024   PLT 61 (L) 02/06/2024      Chemistry      Component Value Date/Time   NA 141 02/06/2024 1541   NA 138 04/27/2022 0847   K 4.4 02/06/2024 1541   CL 109 02/06/2024 1541   CO2 24 02/06/2024 1541   BUN 18 02/06/2024 1541   BUN 19 04/27/2022 0847   CREATININE 1.05 02/06/2024 1541   CREATININE 0.97 07/23/2020 0827      Component Value Date/Time   CALCIUM  9.7 02/06/2024 1541   ALKPHOS 53 02/06/2024 1541   AST 17 02/06/2024 1541   ALT 21 02/06/2024 1541   BILITOT 0.3 02/06/2024 1541       RADIOGRAPHIC STUDIES:  MR BRAIN W WO CONTRAST Result Date: 01/23/2024 CLINICAL DATA:  Provided history: Malignant neoplasm of unspecified part of unspecified bronchus or lung. Small cell lung cancer, staging. EXAM: MRI HEAD WITHOUT AND WITH CONTRAST TECHNIQUE: Multiplanar, multiecho pulse sequences of the brain and surrounding structures were obtained without and with intravenous contrast. CONTRAST:  8mL GADAVIST  GADOBUTROL  1 MMOL/ML IV SOLN COMPARISON:  Brain MRI 07/31/2023.  PET CT 11/03/2023. FINDINGS: Brain: No age-advanced or lobar predominant cerebral atrophy. 11 x 4 mm dural-based enhancing mass along the mid-to-posterior left frontal convexity, unchanged in size from the prior MRI of 07/31/2023 and likely reflecting a meningioma. No mass effect upon the underlying brain parenchyma. No underlying parenchymal edema. No enhancing intracranial lesions identified elsewhere. Multifocal T2 FLAIR hyperintense signal abnormality within the cerebral white matter, mild-to-moderate  for age. Findings are nonspecific, but compatible chronic small vessel ischemic disease. There is no acute infarct. No chronic intracranial blood products. No extra-axial fluid collection. No midline shift. Vascular: Maintained flow voids within the proximal large arterial vessels. Skull and upper cervical spine: No focal worrisome marrow lesion. Sinuses/Orbits: No mass or acute finding within the imaged orbits. No significant paranasal sinus disease. IMPRESSION: 1. No evidence of intracranial metastatic disease. 2. 11 x 4 mm dural-based mass along the left frontal convexity, unchanged in size from the prior MRI of 07/31/2023 and likely reflecting a meningioma. No mass effect upon the underlying brain parenchyma. 3. Chronic small vessel ischemic changes within the cerebral white matter, mild-to-moderate for  age. Electronically Signed   By: Rockey Childs D.O.   On: 01/23/2024 16:18     ASSESSMENT/PLAN:   This is a very pleasant 68 years old Caucasian male with  1) stage Ia (T1c, N0, M0) small cell lung cancer presented with left lower lobe lung nodule diagnosed in May 2025. 2) History of stage IA (T1c, N0, M0) non-small cell lung cancer, squamous cell carcinoma presented with right upper lobe in July 2023 status post right upper lobectomy with lymph node sampling under the care of Dr. Shyrl on 05/10/2022 with tumor size of 2.3 cm.  He had repeat PET scan performed recently that showed hypermetabolic left lower lobe lung nodule with no other evidence of metastatic disease to the mediastinal lymph node or distant metastasis. He had repeat bronchoscopy with biopsy of the left lower lobe lung nodule and unfortunately the final pathology was consistent with a small cell carcinoma. He started systemic chemotherapy with carboplatin  for AUC of 5 on day 1 and etoposide  100 Mg/M2 on days 1, 2 and 3 every 3 weeks for 4 cycles concurrent with SBRT to the left lower lobe lung nodule.  First dose was 01/23/2024 He is  status post 1 cycle of treatment. He required GCSF injections due to neutropenia.   Labs were reviewed. Recommend he *** with cycle #2 today as scheduled. ***doses  We will continue to monitor his labs closely on a weekly basis.   We will arrange zarxio  injections if his ANC is ~0.6 or less.   ***PN? Lyrica or gabapentin  We will see him back in 3 weeks for evaluation and repeat blood work before cycle #3.   The patient was advised to call immediately if she has any concerning symptoms in the interval. The patient voices understanding of current disease status and treatment options and is in agreement with the current care plan. All questions were answered. The patient knows to call the clinic with any problems, questions or concerns. We can certainly see the patient much sooner if necessary   No orders of the defined types were placed in this encounter.    I spent {CHL ONC TIME VISIT - DTPQU:[id number]} counseling the patient face to face. The total time spent in the appointment was {CHL ONC TIME VISIT - DTPQU:[id number]}.  Christia Domke L Camala Talwar, PA-C 02/10/24

## 2024-02-13 ENCOUNTER — Inpatient Hospital Stay

## 2024-02-13 ENCOUNTER — Inpatient Hospital Stay: Admitting: Physician Assistant

## 2024-02-13 VITALS — BP 130/63 | HR 89 | Temp 97.9°F | Resp 16 | Ht 68.0 in | Wt 177.9 lb

## 2024-02-13 DIAGNOSIS — C3432 Malignant neoplasm of lower lobe, left bronchus or lung: Secondary | ICD-10-CM

## 2024-02-13 DIAGNOSIS — Z5111 Encounter for antineoplastic chemotherapy: Secondary | ICD-10-CM | POA: Diagnosis not present

## 2024-02-13 LAB — CBC WITH DIFFERENTIAL (CANCER CENTER ONLY)
Abs Immature Granulocytes: 2.21 10*3/uL — ABNORMAL HIGH (ref 0.00–0.07)
Basophils Absolute: 0.1 10*3/uL (ref 0.0–0.1)
Basophils Relative: 1 %
Eosinophils Absolute: 0 10*3/uL (ref 0.0–0.5)
Eosinophils Relative: 0 %
HCT: 37.5 % — ABNORMAL LOW (ref 39.0–52.0)
Hemoglobin: 13.4 g/dL (ref 13.0–17.0)
Immature Granulocytes: 18 %
Lymphocytes Relative: 14 %
Lymphs Abs: 1.8 10*3/uL (ref 0.7–4.0)
MCH: 32.2 pg (ref 26.0–34.0)
MCHC: 35.7 g/dL (ref 30.0–36.0)
MCV: 90.1 fL (ref 80.0–100.0)
Monocytes Absolute: 1 10*3/uL (ref 0.1–1.0)
Monocytes Relative: 8 %
Neutro Abs: 7.3 10*3/uL (ref 1.7–7.7)
Neutrophils Relative %: 59 %
Platelet Count: 154 10*3/uL (ref 150–400)
RBC: 4.16 MIL/uL — ABNORMAL LOW (ref 4.22–5.81)
RDW: 13.1 % (ref 11.5–15.5)
WBC Count: 12.4 10*3/uL — ABNORMAL HIGH (ref 4.0–10.5)
nRBC: 0 % (ref 0.0–0.2)

## 2024-02-13 LAB — CMP (CANCER CENTER ONLY)
ALT: 24 U/L (ref 0–44)
AST: 20 U/L (ref 15–41)
Albumin: 4.7 g/dL (ref 3.5–5.0)
Alkaline Phosphatase: 82 U/L (ref 38–126)
Anion gap: 11 (ref 5–15)
BUN: 19 mg/dL (ref 8–23)
CO2: 22 mmol/L (ref 22–32)
Calcium: 9.6 mg/dL (ref 8.9–10.3)
Chloride: 107 mmol/L (ref 98–111)
Creatinine: 1.03 mg/dL (ref 0.61–1.24)
GFR, Estimated: >60 mL/min (ref >60)
Glucose, Bld: 215 mg/dL — ABNORMAL HIGH (ref 70–99)
Potassium: 3.7 mmol/L (ref 3.5–5.1)
Sodium: 140 mmol/L (ref 135–145)
Total Bilirubin: 0.4 mg/dL (ref 0.0–1.2)
Total Protein: 7.4 g/dL (ref 6.5–8.1)

## 2024-02-13 MED ORDER — SODIUM CHLORIDE 0.9 % IV SOLN
492.5000 mg | Freq: Once | INTRAVENOUS | Status: AC
  Administered 2024-02-13: 490 mg via INTRAVENOUS
  Filled 2024-02-13: qty 49

## 2024-02-13 MED ORDER — SODIUM CHLORIDE 0.9 % IV SOLN
100.0000 mg/m2 | Freq: Once | INTRAVENOUS | Status: AC
  Administered 2024-02-13: 200 mg via INTRAVENOUS
  Filled 2024-02-13: qty 10

## 2024-02-13 MED ORDER — PALONOSETRON HCL INJECTION 0.25 MG/5ML
0.2500 mg | Freq: Once | INTRAVENOUS | Status: AC
  Administered 2024-02-13: 0.25 mg via INTRAVENOUS
  Filled 2024-02-13: qty 5

## 2024-02-13 MED ORDER — SODIUM CHLORIDE 0.9 % IV SOLN
INTRAVENOUS | Status: DC
Start: 2024-02-13 — End: 2024-02-13

## 2024-02-13 MED ORDER — SODIUM CHLORIDE 0.9 % IV SOLN
150.0000 mg | Freq: Once | INTRAVENOUS | Status: AC
  Administered 2024-02-13: 150 mg via INTRAVENOUS
  Filled 2024-02-13: qty 150

## 2024-02-13 MED ORDER — DEXAMETHASONE SODIUM PHOSPHATE 10 MG/ML IJ SOLN
10.0000 mg | Freq: Once | INTRAMUSCULAR | Status: AC
  Administered 2024-02-13: 10 mg via INTRAVENOUS
  Filled 2024-02-13: qty 1

## 2024-02-13 MED ORDER — GABAPENTIN 100 MG PO CAPS: 100.0000 mg | ORAL_CAPSULE | Freq: Every day | ORAL | 1 refills | Status: DC

## 2024-02-13 NOTE — Patient Instructions (Signed)
 [facility] - A DEPT OF [facility]  Discharge Instructions: Thank you for choosing [facility] to provide your oncology and hematology care.   If you have a lab appointment with the Cancer Center, please go directly to the Cancer Center and check in at the registration area.   Wear comfortable clothing and clothing appropriate for easy access to any Portacath or PICC line.   We strive to give you quality time with your provider. You may need to reschedule your appointment if you arrive late (15 or more minutes).  Arriving late affects you and other patients whose appointments are after yours.  Also, if you miss three or more appointments without notifying the office, you may be dismissed from the clinic at the provider's discretion.      For prescription refill requests, have your pharmacy contact our office and allow 72 hours for refills to be completed.    Today you received the following chemotherapy and/or immunotherapy agents: Carboplatin, Etoposide      To help prevent nausea and vomiting after your treatment, we encourage you to take your nausea medication as directed.  BELOW ARE SYMPTOMS THAT SHOULD BE REPORTED IMMEDIATELY: *FEVER GREATER THAN 100.4 F (38 C) OR HIGHER *CHILLS OR SWEATING *NAUSEA AND VOMITING THAT IS NOT CONTROLLED WITH YOUR NAUSEA MEDICATION *UNUSUAL SHORTNESS OF BREATH *UNUSUAL BRUISING OR BLEEDING *URINARY PROBLEMS (pain or burning when urinating, or frequent urination) *BOWEL PROBLEMS (unusual diarrhea, constipation, pain near the anus) TENDERNESS IN MOUTH AND THROAT WITH OR WITHOUT PRESENCE OF ULCERS (sore throat, sores in mouth, or a toothache) UNUSUAL RASH, SWELLING OR PAIN  UNUSUAL VAGINAL DISCHARGE OR ITCHING   Items with * indicate a potential emergency and should be followed up as soon as possible or go to the Emergency Department if any problems should occur.  Please show the CHEMOTHERAPY ALERT CARD or  IMMUNOTHERAPY ALERT CARD at check-in to the Emergency Department and triage nurse.  Should you have questions after your visit or need to cancel or reschedule your appointment, please contact [facility] - A DEPT OF [facility]  Dept: [phone]  and follow the prompts.  Office hours are 8:00 a.m. to 4:30 p.m. Monday - Friday. Please note that voicemails left after 4:00 p.m. may not be returned until the following business day.  We are closed weekends and major holidays. You have access to a nurse at all times for urgent questions. Please call the main number to the clinic Dept: [phone] and follow the prompts.   For any non-urgent questions, you may also contact your provider using MyChart. We now offer e-Visits for anyone 48 and older to request care online for non-urgent symptoms. For details visit [email or web address].   Also download the MyChart app! Go to the app store, search "MyChart", open the app, select [place], and log in with your MyChart username and password.

## 2024-02-13 NOTE — Progress Notes (Deleted)
 Note started in error.

## 2024-02-14 ENCOUNTER — Inpatient Hospital Stay: Attending: Internal Medicine

## 2024-02-14 VITALS — BP 140/65 | HR 82 | Temp 97.7°F | Resp 16

## 2024-02-14 DIAGNOSIS — Z5111 Encounter for antineoplastic chemotherapy: Secondary | ICD-10-CM | POA: Diagnosis not present

## 2024-02-14 DIAGNOSIS — Z923 Personal history of irradiation: Secondary | ICD-10-CM | POA: Diagnosis not present

## 2024-02-14 DIAGNOSIS — C3432 Malignant neoplasm of lower lobe, left bronchus or lung: Secondary | ICD-10-CM | POA: Insufficient documentation

## 2024-02-14 MED ORDER — SODIUM CHLORIDE 0.9 % IV SOLN: INTRAVENOUS | Status: DC

## 2024-02-14 MED ORDER — DEXAMETHASONE SODIUM PHOSPHATE 10 MG/ML IJ SOLN
10.0000 mg | Freq: Once | INTRAMUSCULAR | Status: AC
  Administered 2024-02-14: 10 mg via INTRAVENOUS
  Filled 2024-02-14: qty 1

## 2024-02-14 MED ORDER — SODIUM CHLORIDE 0.9 % IV SOLN
100.0000 mg/m2 | Freq: Once | INTRAVENOUS | Status: AC
  Administered 2024-02-14: 200 mg via INTRAVENOUS
  Filled 2024-02-14: qty 10

## 2024-02-14 NOTE — Patient Instructions (Signed)
 [facility] - A DEPT OF [facility]  Discharge Instructions: Thank you for choosing [facility] to provide your oncology and hematology care.   If you have a lab appointment with the Cancer Center, please go directly to the Cancer Center and check in at the registration area.   Wear comfortable clothing and clothing appropriate for easy access to any Portacath or PICC line.   We strive to give you quality time with your provider. You may need to reschedule your appointment if you arrive late (15 or more minutes).  Arriving late affects you and other patients whose appointments are after yours.  Also, if you miss three or more appointments without notifying the office, you may be dismissed from the clinic at the provider's discretion.      For prescription refill requests, have your pharmacy contact our office and allow 72 hours for refills to be completed.    Today you received the following chemotherapy and/or immunotherapy agents: etoposide      To help prevent nausea and vomiting after your treatment, we encourage you to take your nausea medication as directed.  BELOW ARE SYMPTOMS THAT SHOULD BE REPORTED IMMEDIATELY: *FEVER GREATER THAN 100.4 F (38 C) OR HIGHER *CHILLS OR SWEATING *NAUSEA AND VOMITING THAT IS NOT CONTROLLED WITH YOUR NAUSEA MEDICATION *UNUSUAL SHORTNESS OF BREATH *UNUSUAL BRUISING OR BLEEDING *URINARY PROBLEMS (pain or burning when urinating, or frequent urination) *BOWEL PROBLEMS (unusual diarrhea, constipation, pain near the anus) TENDERNESS IN MOUTH AND THROAT WITH OR WITHOUT PRESENCE OF ULCERS (sore throat, sores in mouth, or a toothache) UNUSUAL RASH, SWELLING OR PAIN  UNUSUAL VAGINAL DISCHARGE OR ITCHING   Items with * indicate a potential emergency and should be followed up as soon as possible or go to the Emergency Department if any problems should occur.  Please show the CHEMOTHERAPY ALERT CARD or IMMUNOTHERAPY  ALERT CARD at check-in to the Emergency Department and triage nurse.  Should you have questions after your visit or need to cancel or reschedule your appointment, please contact [facility] - A DEPT OF [facility]  Dept: [phone]  and follow the prompts.  Office hours are 8:00 a.m. to 4:30 p.m. Monday - Friday. Please note that voicemails left after 4:00 p.m. may not be returned until the following business day.  We are closed weekends and major holidays. You have access to a nurse at all times for urgent questions. Please call the main number to the clinic Dept: [phone] and follow the prompts.   For any non-urgent questions, you may also contact your provider using MyChart. We now offer e-Visits for anyone 40 and older to request care online for non-urgent symptoms. For details visit [email or web address].   Also download the MyChart app! Go to the app store, search "MyChart", open the app, select [place], and log in with your MyChart username and password.

## 2024-02-15 ENCOUNTER — Inpatient Hospital Stay

## 2024-02-15 ENCOUNTER — Encounter: Payer: Self-pay | Admitting: Internal Medicine

## 2024-02-15 VITALS — BP 138/78 | HR 70 | Temp 97.6°F | Resp 18

## 2024-02-15 DIAGNOSIS — Z5111 Encounter for antineoplastic chemotherapy: Secondary | ICD-10-CM | POA: Diagnosis not present

## 2024-02-15 DIAGNOSIS — C3432 Malignant neoplasm of lower lobe, left bronchus or lung: Secondary | ICD-10-CM

## 2024-02-15 MED ORDER — DEXAMETHASONE SODIUM PHOSPHATE 10 MG/ML IJ SOLN
10.0000 mg | Freq: Once | INTRAMUSCULAR | Status: AC
  Administered 2024-02-15: 10 mg via INTRAVENOUS
  Filled 2024-02-15: qty 1

## 2024-02-15 MED ORDER — SODIUM CHLORIDE 0.9 % IV SOLN
INTRAVENOUS | Status: DC
Start: 2024-02-15 — End: 2024-02-15

## 2024-02-15 MED ORDER — SODIUM CHLORIDE 0.9 % IV SOLN
100.0000 mg/m2 | Freq: Once | INTRAVENOUS | Status: AC
  Administered 2024-02-15: 200 mg via INTRAVENOUS
  Filled 2024-02-15: qty 10

## 2024-02-15 MED FILL — Etoposide Inj 1 GM/50ML (20 MG/ML): INTRAVENOUS | Qty: 10 | Status: AC

## 2024-02-15 NOTE — Patient Instructions (Signed)
 [facility]  Discharge Instructions: Thank you for choosing [facility] to provide your oncology and hematology care.   If you have a lab appointment with the Cancer Center, please go directly to the Cancer Center and check in at the registration area.   Wear comfortable clothing and clothing appropriate for easy access to any Portacath or PICC line.   We strive to give you quality time with your provider. You may need to reschedule your appointment if you arrive late (15 or more minutes).  Arriving late affects you and other patients whose appointments are after yours.  Also, if you miss three or more appointments without notifying the office, you may be dismissed from the clinic at the provider's discretion.      For prescription refill requests, have your pharmacy contact our office and allow 72 hours for refills to be completed.    Today you received the following chemotherapy and/or immunotherapy agents: Etoposide      To help prevent nausea and vomiting after your treatment, we encourage you to take your nausea medication as directed.  BELOW ARE SYMPTOMS THAT SHOULD BE REPORTED IMMEDIATELY: *FEVER GREATER THAN 100.4 F (38 C) OR HIGHER *CHILLS OR SWEATING *NAUSEA AND VOMITING THAT IS NOT CONTROLLED WITH YOUR NAUSEA MEDICATION *UNUSUAL SHORTNESS OF BREATH *UNUSUAL BRUISING OR BLEEDING *URINARY PROBLEMS (pain or burning when urinating, or frequent urination) *BOWEL PROBLEMS (unusual diarrhea, constipation, pain near the anus) TENDERNESS IN MOUTH AND THROAT WITH OR WITHOUT PRESENCE OF ULCERS (sore throat, sores in mouth, or a toothache) UNUSUAL RASH, SWELLING OR PAIN  UNUSUAL VAGINAL DISCHARGE OR ITCHING   Items with * indicate a potential emergency and should be followed up as soon as possible or go to the Emergency Department if any problems should occur.  Please show the CHEMOTHERAPY ALERT CARD or IMMUNOTHERAPY  ALERT CARD at check-in to the Emergency Department and triage nurse.  Should you have questions after your visit or need to cancel or reschedule your appointment, please contact [facility]  Dept: [phone]  and follow the prompts.  Office hours are 8:00 a.m. to 4:30 p.m. Monday - Friday. Please note that voicemails left after 4:00 p.m. may not be returned until the following business day.  We are closed weekends and major holidays. You have access to a nurse at all times for urgent questions. Please call the main number to the clinic Dept: [phone] and follow the prompts.   For any non-urgent questions, you may also contact your provider using MyChart. We now offer e-Visits for anyone 10 and older to request care online for non-urgent symptoms. For details visit [email or web address].   Also download the MyChart app! Go to the app store, search "MyChart", open the app, select [place], and log in with your MyChart username and password.

## 2024-02-16 NOTE — Progress Notes (Signed)
  Radiation Oncology         [phone] ________________________________  Name: Gregory Jensen MRN: [id number]  Date of Service: 02/20/2024  DOB: [birth year]  Post Treatment Telephone Note  Diagnosis:  C34.32 Malignant neoplasm of lower lobe, left bronchus or lung (as documented in provider EOT note)  The patient was available for call today.   Symptoms of fatigue have improved since completing therapy.  Symptoms of skin changes have improved since completing therapy.  Symptoms of esophagitis have improved since completing therapy.   The patient has scheduled follow up with his medical oncologist Dr. Sherrod Sherrod for ongoing care, and was encouraged to call if he develops concerns or questions regarding radiation.   This concludes the interaction.  Rosaline Minerva, LPN

## 2024-02-20 ENCOUNTER — Ambulatory Visit
Admission: RE | Admit: 2024-02-20 | Discharge: 2024-02-20 | Disposition: A | Discharge status: Home / Self Care | Source: Ambulatory Visit | Attending: Internal Medicine | Admitting: Internal Medicine

## 2024-02-20 ENCOUNTER — Inpatient Hospital Stay

## 2024-02-20 DIAGNOSIS — F17210 Nicotine dependence, cigarettes, uncomplicated: Secondary | ICD-10-CM | POA: Insufficient documentation

## 2024-02-20 DIAGNOSIS — C3432 Malignant neoplasm of lower lobe, left bronchus or lung: Secondary | ICD-10-CM

## 2024-02-20 DIAGNOSIS — Z5111 Encounter for antineoplastic chemotherapy: Secondary | ICD-10-CM | POA: Diagnosis not present

## 2024-02-20 LAB — CMP (CANCER CENTER ONLY)
ALT: 23 U/L (ref 0–44)
AST: 16 U/L (ref 15–41)
Albumin: 4.3 g/dL (ref 3.5–5.0)
Alkaline Phosphatase: 51 U/L (ref 38–126)
Anion gap: 8 (ref 5–15)
BUN: 26 mg/dL — ABNORMAL HIGH (ref 8–23)
CO2: 27 mmol/L (ref 22–32)
Calcium: 9.7 mg/dL (ref 8.9–10.3)
Chloride: 103 mmol/L (ref 98–111)
Creatinine: 0.99 mg/dL (ref 0.61–1.24)
GFR, Estimated: >60 mL/min (ref >60)
Glucose, Bld: 171 mg/dL — ABNORMAL HIGH (ref 70–99)
Potassium: 4 mmol/L (ref 3.5–5.1)
Sodium: 138 mmol/L (ref 135–145)
Total Bilirubin: 0.6 mg/dL (ref 0.0–1.2)
Total Protein: 6.6 g/dL (ref 6.5–8.1)

## 2024-02-20 LAB — CBC WITH DIFFERENTIAL (CANCER CENTER ONLY)
Abs Immature Granulocytes: 0.11 K/uL — ABNORMAL HIGH (ref 0.00–0.07)
Basophils Absolute: 0.1 K/uL (ref 0.0–0.1)
Basophils Relative: 1 %
Eosinophils Absolute: 0 K/uL (ref 0.0–0.5)
Eosinophils Relative: 0 %
HCT: 32.9 % — ABNORMAL LOW (ref 39.0–52.0)
Hemoglobin: 11.9 g/dL — ABNORMAL LOW (ref 13.0–17.0)
Immature Granulocytes: 2 %
Lymphocytes Relative: 19 %
Lymphs Abs: 1 K/uL (ref 0.7–4.0)
MCH: 32 pg (ref 26.0–34.0)
MCHC: 36.2 g/dL — ABNORMAL HIGH (ref 30.0–36.0)
MCV: 88.4 fL (ref 80.0–100.0)
Monocytes Absolute: 0.1 K/uL (ref 0.1–1.0)
Monocytes Relative: 1 %
Neutro Abs: 4 K/uL (ref 1.7–7.7)
Neutrophils Relative %: 77 %
Platelet Count: 171 K/uL (ref 150–400)
RBC: 3.72 MIL/uL — ABNORMAL LOW (ref 4.22–5.81)
RDW: 12.6 % (ref 11.5–15.5)
WBC Count: 5.2 K/uL (ref 4.0–10.5)
nRBC: 0 % (ref 0.0–0.2)

## 2024-02-27 ENCOUNTER — Telehealth: Payer: Self-pay

## 2024-02-27 ENCOUNTER — Inpatient Hospital Stay

## 2024-02-27 DIAGNOSIS — Z5111 Encounter for antineoplastic chemotherapy: Secondary | ICD-10-CM | POA: Diagnosis not present

## 2024-02-27 DIAGNOSIS — C3432 Malignant neoplasm of lower lobe, left bronchus or lung: Secondary | ICD-10-CM

## 2024-02-27 LAB — CBC WITH DIFFERENTIAL (CANCER CENTER ONLY)
Abs Immature Granulocytes: 0.01 K/uL (ref 0.00–0.07)
Basophils Absolute: 0 K/uL (ref 0.0–0.1)
Basophils Relative: 1 %
Eosinophils Absolute: 0.1 K/uL (ref 0.0–0.5)
Eosinophils Relative: 3 %
HCT: 31.1 % — ABNORMAL LOW (ref 39.0–52.0)
Hemoglobin: 11.2 g/dL — ABNORMAL LOW (ref 13.0–17.0)
Immature Granulocytes: 0 %
Lymphocytes Relative: 52 %
Lymphs Abs: 1.3 K/uL (ref 0.7–4.0)
MCH: 32.4 pg (ref 26.0–34.0)
MCHC: 36 g/dL (ref 30.0–36.0)
MCV: 89.9 fL (ref 80.0–100.0)
Monocytes Absolute: 0.3 K/uL (ref 0.1–1.0)
Monocytes Relative: 12 %
Neutro Abs: 0.8 K/uL — ABNORMAL LOW (ref 1.7–7.7)
Neutrophils Relative %: 32 %
Platelet Count: 87 K/uL — ABNORMAL LOW (ref 150–400)
RBC: 3.46 MIL/uL — ABNORMAL LOW (ref 4.22–5.81)
RDW: 13 % (ref 11.5–15.5)
WBC Count: 2.5 K/uL — ABNORMAL LOW (ref 4.0–10.5)
nRBC: 0 % (ref 0.0–0.2)

## 2024-02-27 LAB — CMP (CANCER CENTER ONLY)
ALT: 18 U/L (ref 0–44)
AST: 16 U/L (ref 15–41)
Albumin: 4.4 g/dL (ref 3.5–5.0)
Alkaline Phosphatase: 57 U/L (ref 38–126)
Anion gap: 7 (ref 5–15)
BUN: 22 mg/dL (ref 8–23)
CO2: 27 mmol/L (ref 22–32)
Calcium: 9.9 mg/dL (ref 8.9–10.3)
Chloride: 108 mmol/L (ref 98–111)
Creatinine: 1.15 mg/dL (ref 0.61–1.24)
GFR, Estimated: >60 mL/min (ref >60)
Glucose, Bld: 134 mg/dL — ABNORMAL HIGH (ref 70–99)
Potassium: 4.3 mmol/L (ref 3.5–5.1)
Sodium: 142 mmol/L (ref 135–145)
Total Bilirubin: 0.3 mg/dL (ref 0.0–1.2)
Total Protein: 6.8 g/dL (ref 6.5–8.1)

## 2024-02-27 NOTE — Telephone Encounter (Signed)
 Tried to reach patient regarding lab results. Per Cassie, PA, patient needs to review neutropenic precautions. Left voicemail for patient to return call to review and discuss further.

## 2024-02-28 ENCOUNTER — Encounter: Payer: Self-pay | Admitting: Internal Medicine

## 2024-02-28 NOTE — Telephone Encounter (Signed)
 Bleeding and Neutropenia precautions. Bleeding precautions reviewed with pt .  Neutropenic precautions reviewed with pt. Neutropenia is a condition that occurs when you have a lower-than-normal level of a type of white blood cell (neutrophil) in your body. Neutrophils are made in the spongy center of large bones (bone marrow) and they fight infections. Neutrophils are your body's main defense against bacterial and fungal infections. The fewer neutrophils you have and the longer your body remains without them, the greater your risk of getting a severe infection. What are the signs or symptoms? This condition does not usually cause symptoms. If symptoms are present, they are usually caused by an underlying infection. Symptoms of an infection may include: Fever. Chills. Swollen glands. Oral or anal ulcers. Cough and shortness of breath. Rash. Skin infection. Fatigue. How is this diagnosed? Your health care provider may suspect neutropenia if you have: A condition that may cause neutropenia. Symptoms of infection, especially fever. Frequent and unusual infections. Follow these instructions at home: Medicines Take over-the-counter and prescription medicines only as told by your health care provider. Get a seasonal flu shot (influenza vaccine). Lifestyle Do not eat unpasteurized foods.Do not eat unwashed raw fruits or vegetables. Avoid exposure to groups of people or children. Avoid being around people who are sick. Avoid being around dirt or dust, such as in construction areas or gardens. Do not provide direct care for pets. Avoid animal droppings. Do not clean litter boxes and bird cages. Hygiene Bathe daily. Clean the area between the genitals and the anus (perineal area) after you urinate or have a bowel movement. If you are male, wipe from front to back. Brush your teeth with a soft toothbrush before and after meals. Do not use a razor that has a blade. Use an electric razor to  remove hair. Wash your hands often. Make sure others who come in contact with you also wash their hands. If soap and water are not available, use hand sanitizer. General instructions Take actions to avoid cuts and burns. For example: Be cautious when you use knives. Always cut away from yourself. Keep knives in protective sheaths or guards when not in use. Use oven mitts when you cook with a hot stove, oven, or grill. Stand a safe distance away from open fires. Avoid people who received a vaccine in the past 30 days if that vaccine contained a live version of the germ (live vaccine). You should not get a live vaccine. Common live vaccines are varicella, measles, mumps, and rubella. Do not share food utensils. Do not use tampons, enemas, or rectal suppositories unless your health care provider has approved. Keep all appointments as told by your health care provider. This is important. Contact a health care provider if: You have a fever. You have chills or you start to shake. You have: A sore throat. A warm, red, or tender area on your skin. A cough. Frequent or painful urination. Vaginal discharge or itching. You develop: Sores in your mouth or anus. Swollen lymph nodes. Red streaks on the skin. A rash. You feel: Nauseous or you vomit. Very fatigued. Short of breath. This information is not intended to replace advice given to you by your health care provider. Make sure you discuss any questions you have with your health care provider.  Risk analyst Patient Education  [employer].

## 2024-03-02 MED FILL — Fosaprepitant Dimeglumine For IV Infusion 150 MG (Base Eq): INTRAVENOUS | Qty: 5 | Status: AC

## 2024-03-05 ENCOUNTER — Inpatient Hospital Stay

## 2024-03-05 ENCOUNTER — Inpatient Hospital Stay (HOSPITAL_BASED_OUTPATIENT_CLINIC_OR_DEPARTMENT_OTHER): Admitting: Internal Medicine

## 2024-03-05 VITALS — BP 139/79 | HR 84 | Temp 98.7°F | Resp 18 | Ht 68.0 in | Wt 178.0 lb

## 2024-03-05 DIAGNOSIS — C3432 Malignant neoplasm of lower lobe, left bronchus or lung: Secondary | ICD-10-CM

## 2024-03-05 DIAGNOSIS — Z5111 Encounter for antineoplastic chemotherapy: Secondary | ICD-10-CM | POA: Diagnosis not present

## 2024-03-05 LAB — CMP (CANCER CENTER ONLY)
ALT: 19 U/L (ref 0–44)
AST: 19 U/L (ref 15–41)
Albumin: 4.5 g/dL (ref 3.5–5.0)
Alkaline Phosphatase: 61 U/L (ref 38–126)
Anion gap: 9 (ref 5–15)
BUN: 16 mg/dL (ref 8–23)
CO2: 25 mmol/L (ref 22–32)
Calcium: 9.7 mg/dL (ref 8.9–10.3)
Chloride: 105 mmol/L (ref 98–111)
Creatinine: 1.1 mg/dL (ref 0.61–1.24)
GFR, Estimated: >60 mL/min (ref >60)
Glucose, Bld: 222 mg/dL — ABNORMAL HIGH (ref 70–99)
Potassium: 3.6 mmol/L (ref 3.5–5.1)
Sodium: 139 mmol/L (ref 135–145)
Total Bilirubin: 0.5 mg/dL (ref 0.0–1.2)
Total Protein: 7.3 g/dL (ref 6.5–8.1)

## 2024-03-05 LAB — CBC WITH DIFFERENTIAL (CANCER CENTER ONLY)
Abs Immature Granulocytes: 0.44 K/uL — ABNORMAL HIGH (ref 0.00–0.07)
Basophils Absolute: 0.1 K/uL (ref 0.0–0.1)
Basophils Relative: 1 %
Eosinophils Absolute: 0.1 K/uL (ref 0.0–0.5)
Eosinophils Relative: 1 %
HCT: 33.3 % — ABNORMAL LOW (ref 39.0–52.0)
Hemoglobin: 12 g/dL — ABNORMAL LOW (ref 13.0–17.0)
Immature Granulocytes: 6 %
Lymphocytes Relative: 21 %
Lymphs Abs: 1.5 K/uL (ref 0.7–4.0)
MCH: 32.6 pg (ref 26.0–34.0)
MCHC: 36 g/dL (ref 30.0–36.0)
MCV: 90.5 fL (ref 80.0–100.0)
Monocytes Absolute: 0.8 K/uL (ref 0.1–1.0)
Monocytes Relative: 12 %
Neutro Abs: 4.1 K/uL (ref 1.7–7.7)
Neutrophils Relative %: 59 %
Platelet Count: 265 K/uL (ref 150–400)
RBC: 3.68 MIL/uL — ABNORMAL LOW (ref 4.22–5.81)
RDW: 14.5 % (ref 11.5–15.5)
WBC Count: 7 K/uL (ref 4.0–10.5)
nRBC: 0 % (ref 0.0–0.2)

## 2024-03-05 MED ORDER — FOSAPREPITANT DIMEGLUMINE INJECTION 150 MG
150.0000 mg | Freq: Once | INTRAVENOUS | Status: AC
  Administered 2024-03-05: 150 mg via INTRAVENOUS
  Filled 2024-03-05: qty 150

## 2024-03-05 MED ORDER — PALONOSETRON HCL INJECTION 0.25 MG/5ML
0.2500 mg | Freq: Once | INTRAVENOUS | Status: AC
  Administered 2024-03-05: 0.25 mg via INTRAVENOUS
  Filled 2024-03-05: qty 5

## 2024-03-05 MED ORDER — DEXAMETHASONE SODIUM PHOSPHATE 10 MG/ML IJ SOLN
10.0000 mg | Freq: Once | INTRAMUSCULAR | Status: AC
  Administered 2024-03-05: 10 mg via INTRAVENOUS
  Filled 2024-03-05: qty 1

## 2024-03-05 MED ORDER — SODIUM CHLORIDE 0.9 % IV SOLN
100.0000 mg/m2 | Freq: Once | INTRAVENOUS | Status: AC
  Administered 2024-03-05: 200 mg via INTRAVENOUS
  Filled 2024-03-05: qty 10

## 2024-03-05 MED ORDER — SODIUM CHLORIDE 0.9 % IV SOLN
492.5000 mg | Freq: Once | INTRAVENOUS | Status: AC
  Administered 2024-03-05: 490 mg via INTRAVENOUS
  Filled 2024-03-05: qty 49

## 2024-03-05 MED ORDER — SODIUM CHLORIDE 0.9 % IV SOLN
INTRAVENOUS | Status: DC
Start: 2024-03-05 — End: 2024-03-05

## 2024-03-05 NOTE — Progress Notes (Signed)
 [facility] [phone]   [phone]  OFFICE PROGRESS NOTE  Gregory Worth HERO, MD [address] [place] [address]  DIAGNOSIS:   1) stage IA (T1c, N0, M0) small cell lung cancer presented with left lower lobe lung nodule diagnosed in May 2025.  2) Stage IA (T1c, N0, M0) non-small cell lung cancer, squamous cell carcinoma presented with right upper lobe in July 2023   PRIOR THERAPY: Status post right upper lobectomy with lymph node sampling under the care of Dr. Shyrl on 05/10/2022 with tumor size of 2.3 cm.   CURRENT THERAPY: Systemic chemotherapy with carboplatin  for AUC of 5 on day 1 and oh etoposide  100 Mg/M2 on days 1, 2 and 3 every 3 weeks.  First dose 01/23/2024.  This is concurrent with SBRT to the left lower lobe lung nodule under the care of Dr. Dewey.  It is post 1 cycle.  INTERVAL HISTORY: Gregory Jensen 68 y.o. male returns to the clinic today for follow-up visit. Discussed the use of AI scribe software for clinical note transcription with the patient, who gave verbal consent to proceed.  History of Present Illness Gregory Jensen is a 68 year old male with limited stage small cell lung cancer who presents for evaluation before starting cycle number three of chemotherapy.  Diagnosed with limited stage small cell lung cancer in May 2025, he is undergoing systemic chemotherapy with carboplatin  and etoposide  every three weeks. He has completed two cycles of chemotherapy concurrent with stereotactic body radiation therapy to the left lower lobe lung nodule.  The second round of chemotherapy was similar to the first, with no significant issues such as nausea, vomiting, diarrhea, chest pain, shortness of breath, or cough. However, he experiences worsening neuropathy and persistent pain in his neck, shoulder, and forearm, particularly on the left side. The patient believes this pain is related to his arthritis and disc disease in his  neck.  Current medications include carboplatin  and etoposide , though specific dosages and frequencies were not discussed.     MEDICAL HISTORY: Past Medical History:  Diagnosis Date   Cancer (HCC) 4 years ago   Prostate Cancer , Lung Cancer   Diabetes mellitus without complication (HCC)    type 2   Hyperlipidemia    Hypertension    Lung cancer (HCC)    Left Lower Lobe Lung   Neuromuscular disorder (HCC)    neuropathy   Neuropathy    in legs causing balance issues per patient   Sleep apnea    Smoker     ALLERGIES:  is allergic to jardiance  [empagliflozin ].  MEDICATIONS:  Current Outpatient Medications  Medication Sig Dispense Refill   aspirin  81 MG EC tablet Take 81 mg by mouth in the morning.     atorvastatin  (LIPITOR) 40 MG tablet TAKE 1 TABLET BY MOUTH EVERY DAY 90 tablet 0   Blood Glucose Monitoring Suppl (ONE TOUCH ULTRA 2) w/Device KIT Used TID to check glucose or PRN Code E11.9 1 kit 0   cyanocobalamin  1000 MCG tablet Take 1,000 mcg by mouth daily.     fenofibrate  (TRICOR ) 145 MG tablet TAKE 1 TABLET BY MOUTH EVERY DAY 90 tablet 0   gabapentin  (NEURONTIN ) 100 MG capsule Take 1-2 capsules (100-200 mg total) by mouth at bedtime. 60 capsule 1   glucose blood (ONETOUCH VERIO) test strip 1 each by Other route daily as needed for other. Use as instructed 100 each 4   ibuprofen (ADVIL) 200  MG tablet Take 400-600 mg by mouth daily as needed (pain.).     losartan  (COZAAR ) 50 MG tablet TAKE 1 TABLET BY MOUTH EVERY DAY 90 tablet 0   metFORMIN  (GLUCOPHAGE ) 500 MG tablet TAKE 1 TABLET BY MOUTH TWICE A DAY WITH FOOD 180 tablet 1   ondansetron  (ZOFRAN ) 8 MG tablet Take 1 tablet (8 mg total) by mouth every 8 (eight) hours as needed for nausea or vomiting. Start on third day after chemotherapy. 30 tablet 1   OneTouch Delica Lancets 33G MISC 1 each by Does not apply route daily as needed. 100 each 4   oxyCODONE -acetaminophen  (PERCOCET/ROXICET) 5-325 MG tablet Take 1 tablet by mouth  every 8 (eight) hours as needed for severe pain (pain score 7-10). 30 tablet 0   prochlorperazine  (COMPAZINE ) 10 MG tablet Take 1 tablet (10 mg total) by mouth every 6 (six) hours as needed for nausea or vomiting (Nausea or vomiting). 30 tablet 1   No current facility-administered medications for this visit.    SURGICAL HISTORY:  Past Surgical History:  Procedure Laterality Date   BACK SURGERY  15 years ago   BRONCHIAL BIOPSY  05/10/2022   Procedure: BRONCHIAL BIOPSIES;  Surgeon: Shelah Lamar RAMAN, MD;  Location: [facility] ENDOSCOPY;  Service: Pulmonary;;   BRONCHIAL BIOPSY  12/26/2023   Procedure: BRONCHOSCOPY, WITH BIOPSY;  Surgeon: Shelah Lamar RAMAN, MD;  Location: [facility] ENDOSCOPY;  Service: Pulmonary;;   BRONCHIAL BRUSHINGS  05/10/2022   Procedure: BRONCHIAL BRUSHINGS;  Surgeon: Shelah Lamar RAMAN, MD;  Location: [facility] ENDOSCOPY;  Service: Pulmonary;;   BRONCHIAL BRUSHINGS  12/26/2023   Procedure: BRONCHOSCOPY, WITH BRUSH BIOPSY;  Surgeon: Shelah Lamar RAMAN, MD;  Location: [facility] ENDOSCOPY;  Service: Pulmonary;;   BRONCHIAL NEEDLE ASPIRATION BIOPSY  05/10/2022   Procedure: BRONCHIAL NEEDLE ASPIRATION BIOPSIES;  Surgeon: Shelah Lamar RAMAN, MD;  Location: [facility] ENDOSCOPY;  Service: Pulmonary;;   BRONCHIAL NEEDLE ASPIRATION BIOPSY  12/26/2023   Procedure: BRONCHOSCOPY, WITH NEEDLE ASPIRATION BIOPSY;  Surgeon: Shelah Lamar RAMAN, MD;  Location: [facility] ENDOSCOPY;  Service: Pulmonary;;   COLONOSCOPY  2007   pt does not know MD name/normal exam per pt.   FIDUCIAL MARKER PLACEMENT  05/10/2022   Procedure: FIDUCIAL DYE MARKING;  Surgeon: Shelah Lamar RAMAN, MD;  Location[facility] ENDOSCOPY;  Service: Pulmonary;;   FIDUCIAL MARKER PLACEMENT  12/26/2023   Procedure: INSERTION, FIDUCIAL MARKERS;  Surgeon: Shelah Lamar RAMAN, MD;  Location: [facility] ENDOSCOPY;  Service: Pulmonary;;   INTERCOSTAL NERVE BLOCK Right 05/10/2022   Procedure: INTERCOSTAL NERVE BLOCK;  Surgeon: Shyrl Linnie KIDD, MD;  Location: [facility] OR;  Service: Thoracic;  Laterality: Right;    NODE DISSECTION Right 05/10/2022   Procedure: NODE DISSECTION;  Surgeon: Shyrl Linnie KIDD, MD;  Location: [facility] OR;  Service: Thoracic;  Laterality: Right;   PROSTATECTOMY  4 years ago   VIDEO BRONCHOSCOPY WITH ENDOBRONCHIAL NAVIGATION Left 12/26/2023   Procedure: VIDEO BRONCHOSCOPY WITH ENDOBRONCHIAL NAVIGATION;  Surgeon: Shelah Lamar RAMAN, MD;  Location: [facility] ENDOSCOPY;  Service: Pulmonary;  Laterality: Left;    REVIEW OF SYSTEMS:  A comprehensive review of systems was negative except for: Constitutional: positive for fatigue   PHYSICAL EXAMINATION: General appearance: alert, cooperative, fatigued, and no distress Head: Normocephalic, without obvious abnormality, atraumatic Neck: no adenopathy, no JVD, supple, symmetrical, trachea midline, and thyroid  not enlarged, symmetric, no tenderness/mass/nodules Lymph nodes: Cervical, supraclavicular, and axillary nodes normal. Resp: clear to auscultation bilaterally Back: symmetric, no curvature. ROM normal. No CVA tenderness. Cardio: regular rate and rhythm, S1, S2 normal, no murmur,  click, rub or gallop GI: soft, non-tender; bowel sounds normal; no masses,  no organomegaly Extremities: extremities normal, atraumatic, no cyanosis or edema  ECOG PERFORMANCE STATUS: 1 - Symptomatic but completely ambulatory  Blood pressure 139/79, pulse 84, temperature 98.7 F (37.1 C), temperature source Temporal, resp. rate 18, height 5' 8 (1.727 m), weight 178 lb (80.7 kg), SpO2 97%.  LABORATORY DATA: Lab Results  Component Value Date   WBC 7.0 03/05/2024   HGB 12.0 (L) 03/05/2024   HCT 33.3 (L) 03/05/2024   MCV 90.5 03/05/2024   PLT 265 03/05/2024      Chemistry      Component Value Date/Time   NA 139 03/05/2024 1100   NA 138 04/27/2022 0847   K 3.6 03/05/2024 1100   CL 105 03/05/2024 1100   CO2 25 03/05/2024 1100   BUN 16 03/05/2024 1100   BUN 19 04/27/2022 0847   CREATININE 1.10 03/05/2024 1100   CREATININE 0.97 07/23/2020 0827       Component Value Date/Time   CALCIUM  9.7 03/05/2024 1100   ALKPHOS 61 03/05/2024 1100   AST 19 03/05/2024 1100   ALT 19 03/05/2024 1100   BILITOT 0.5 03/05/2024 1100       RADIOGRAPHIC STUDIES: No results found.   ASSESSMENT AND PLAN: This is a very pleasant 68 years old white male with:  1) stage Ia (T1c, N0, M0) small cell lung cancer presented with left lower lobe lung nodule diagnosed in May 2025. 2) History of stage IA (T1c, N0, M0) non-small cell lung cancer, squamous cell carcinoma presented with right upper lobe in July 2023 status post right upper lobectomy with lymph node sampling under the care of Dr. Shyrl on 05/10/2022 with tumor size of 2.3 cm.  He had repeat PET scan performed recently that showed hypermetabolic left lower lobe lung nodule with no other evidence of metastatic disease to the mediastinal lymph node or distant metastasis. He had repeat bronchoscopy with biopsy of the left lower lobe lung nodule and unfortunately the final pathology was consistent with a small cell carcinoma. He started systemic chemotherapy with carboplatin  for AUC of 5 on day 1 and etoposide  100 Mg/M2 on days 1, 2 and 3 every 3 weeks for 4 cycles concurrent with SBRT to the left lower lobe lung nodule.  First dose was 01/23/2024 He is status post 2 cycle of treatment.  The patient has been tolerating his treatment fairly well. Assessment and Plan Assessment & Plan Limited stage small cell lung cancer Limited stage small cell lung cancer diagnosed in May 2025. Currently undergoing systemic chemotherapy with carboplatin  and etoposide  every three weeks, concurrent with SPRT to the left lower lobe lung nodule. Completed two cycles with no significant side effects such as nausea, vomiting, or diarrhea. No chest pain, shortness of breath, or cough reported. Lab work is adequate for proceeding with treatment. - Proceed with cycle number three of chemotherapy with carboplatin  and  etoposide .  Cervical disc disease Cervical disc disease contributing to pain in the neck, shoulder, and left forearm. Neuropathy symptoms have worsened.  Arthritis Arthritis present, contributing to pain in the neck and shoulder areas. The patient was advised to call immediately if he has any concerning symptoms in the interval.  The patient voices understanding of current disease status and treatment options and is in agreement with the current care plan.  All questions were answered. The patient knows to call the clinic with any problems, questions or concerns. We can certainly see the patient  much sooner if necessary.  The total time spent in the appointment was 20 minutes.  Disclaimer: This note was dictated with voice recognition software. Similar sounding words can inadvertently be transcribed and may not be corrected upon review.

## 2024-03-05 NOTE — Patient Instructions (Signed)
 [facility] - A DEPT OF [facility]  Discharge Instructions: Thank you for choosing [facility] to provide your oncology and hematology care.   If you have a lab appointment with the Cancer Center, please go directly to the Cancer Center and check in at the registration area.   Wear comfortable clothing and clothing appropriate for easy access to any Portacath or PICC line.   We strive to give you quality time with your provider. You may need to reschedule your appointment if you arrive late (15 or more minutes).  Arriving late affects you and other patients whose appointments are after yours.  Also, if you miss three or more appointments without notifying the office, you may be dismissed from the clinic at the provider's discretion.      For prescription refill requests, have your pharmacy contact our office and allow 72 hours for refills to be completed.    Today you received the following chemotherapy and/or immunotherapy agents: Carboplatin, Etoposide      To help prevent nausea and vomiting after your treatment, we encourage you to take your nausea medication as directed.  BELOW ARE SYMPTOMS THAT SHOULD BE REPORTED IMMEDIATELY: *FEVER GREATER THAN 100.4 F (38 C) OR HIGHER *CHILLS OR SWEATING *NAUSEA AND VOMITING THAT IS NOT CONTROLLED WITH YOUR NAUSEA MEDICATION *UNUSUAL SHORTNESS OF BREATH *UNUSUAL BRUISING OR BLEEDING *URINARY PROBLEMS (pain or burning when urinating, or frequent urination) *BOWEL PROBLEMS (unusual diarrhea, constipation, pain near the anus) TENDERNESS IN MOUTH AND THROAT WITH OR WITHOUT PRESENCE OF ULCERS (sore throat, sores in mouth, or a toothache) UNUSUAL RASH, SWELLING OR PAIN  UNUSUAL VAGINAL DISCHARGE OR ITCHING   Items with * indicate a potential emergency and should be followed up as soon as possible or go to the Emergency Department if any problems should occur.  Please show the CHEMOTHERAPY ALERT CARD or  IMMUNOTHERAPY ALERT CARD at check-in to the Emergency Department and triage nurse.  Should you have questions after your visit or need to cancel or reschedule your appointment, please contact [facility] - A DEPT OF [facility]  Dept: [phone]  and follow the prompts.  Office hours are 8:00 a.m. to 4:30 p.m. Monday - Friday. Please note that voicemails left after 4:00 p.m. may not be returned until the following business day.  We are closed weekends and major holidays. You have access to a nurse at all times for urgent questions. Please call the main number to the clinic Dept: [phone] and follow the prompts.   For any non-urgent questions, you may also contact your provider using MyChart. We now offer e-Visits for anyone 48 and older to request care online for non-urgent symptoms. For details visit [email or web address].   Also download the MyChart app! Go to the app store, search "MyChart", open the app, select [place], and log in with your MyChart username and password.

## 2024-03-05 NOTE — Progress Notes (Signed)
 Nutrition Assessment   Reason for Assessment:  Patient identified on Malnutrition Screening report for weight loss   ASSESSMENT:  68 year old male with stage 1 non-small lung cancer, left.  History of non-small cell lung cancer on right July 2023, s/p right lobectomy.  Receiving carboplatin  and etoposide .    Met with patient during infusion.  Reports that he is eating but lower volume than before.  Typically has cereal and pop tart for breakfast.  Either has lunch or supper.  It maybe sandwich, burger and fries, hot dog, pasta.  Has not tried oral nutrition supplements before.     Medications: compazine , zofran , vit B 12   Labs: glucose 222   Anthropometrics:   Height: 68 inches Weight: 178 lb UBW: 183-186 lb 5/27 186 lb BMI: 27  4% weight loss in the last 2 months   Estimated Energy Needs  Kcals: 2025-2400 Protein: 101-120 g Fluid: 2025-2400 ml   NUTRITION DIAGNOSIS: Inadequate oral intake related to cancer and related treatment side effects as evidenced by 4% weight loss in 2 months and decreased intake    INTERVENTION:  Recommend adding low sugar oral nutrition supplement.  Samples of glucerna and ensure max protein given along with coupons Encouraged foods rich in protein.  Handout on High Calorie, High protein Snacks given to patient. Contact information given   MONITORING, EVALUATION, GOAL: weight trends, intake   Next Visit: Monday, Aug 11 during infusion  Azul Coffie B. Dasie SOLON, CSO, LDN Registered Dietitian [phone]

## 2024-03-06 ENCOUNTER — Inpatient Hospital Stay

## 2024-03-06 VITALS — BP 131/61 | HR 82 | Temp 97.9°F | Resp 18

## 2024-03-06 DIAGNOSIS — Z5111 Encounter for antineoplastic chemotherapy: Secondary | ICD-10-CM | POA: Diagnosis not present

## 2024-03-06 DIAGNOSIS — C3432 Malignant neoplasm of lower lobe, left bronchus or lung: Secondary | ICD-10-CM

## 2024-03-06 MED ORDER — SODIUM CHLORIDE 0.9 % IV SOLN
100.0000 mg/m2 | Freq: Once | INTRAVENOUS | Status: AC
  Administered 2024-03-06: 200 mg via INTRAVENOUS
  Filled 2024-03-06: qty 10

## 2024-03-06 MED ORDER — SODIUM CHLORIDE 0.9 % IV SOLN: INTRAVENOUS | Status: DC

## 2024-03-06 MED ORDER — DEXAMETHASONE SODIUM PHOSPHATE 10 MG/ML IJ SOLN
10.0000 mg | Freq: Once | INTRAMUSCULAR | Status: AC
  Administered 2024-03-06: 10 mg via INTRAVENOUS
  Filled 2024-03-06: qty 1

## 2024-03-06 NOTE — Patient Instructions (Signed)
 [facility] - A DEPT OF [facility]  Discharge Instructions: Thank you for choosing [facility] to provide your oncology and hematology care.   If you have a lab appointment with the Cancer Center, please go directly to the Cancer Center and check in at the registration area.   Wear comfortable clothing and clothing appropriate for easy access to any Portacath or PICC line.   We strive to give you quality time with your provider. You may need to reschedule your appointment if you arrive late (15 or more minutes).  Arriving late affects you and other patients whose appointments are after yours.  Also, if you miss three or more appointments without notifying the office, you may be dismissed from the clinic at the provider's discretion.      For prescription refill requests, have your pharmacy contact our office and allow 72 hours for refills to be completed.    Today you received the following chemotherapy and/or immunotherapy agents: etoposide      To help prevent nausea and vomiting after your treatment, we encourage you to take your nausea medication as directed.  BELOW ARE SYMPTOMS THAT SHOULD BE REPORTED IMMEDIATELY: *FEVER GREATER THAN 100.4 F (38 C) OR HIGHER *CHILLS OR SWEATING *NAUSEA AND VOMITING THAT IS NOT CONTROLLED WITH YOUR NAUSEA MEDICATION *UNUSUAL SHORTNESS OF BREATH *UNUSUAL BRUISING OR BLEEDING *URINARY PROBLEMS (pain or burning when urinating, or frequent urination) *BOWEL PROBLEMS (unusual diarrhea, constipation, pain near the anus) TENDERNESS IN MOUTH AND THROAT WITH OR WITHOUT PRESENCE OF ULCERS (sore throat, sores in mouth, or a toothache) UNUSUAL RASH, SWELLING OR PAIN  UNUSUAL VAGINAL DISCHARGE OR ITCHING   Items with * indicate a potential emergency and should be followed up as soon as possible or go to the Emergency Department if any problems should occur.  Please show the CHEMOTHERAPY ALERT CARD or IMMUNOTHERAPY  ALERT CARD at check-in to the Emergency Department and triage nurse.  Should you have questions after your visit or need to cancel or reschedule your appointment, please contact [facility] - A DEPT OF [facility]  Dept: [phone]  and follow the prompts.  Office hours are 8:00 a.m. to 4:30 p.m. Monday - Friday. Please note that voicemails left after 4:00 p.m. may not be returned until the following business day.  We are closed weekends and major holidays. You have access to a nurse at all times for urgent questions. Please call the main number to the clinic Dept: [phone] and follow the prompts.   For any non-urgent questions, you may also contact your provider using MyChart. We now offer e-Visits for anyone 40 and older to request care online for non-urgent symptoms. For details visit [email or web address].   Also download the MyChart app! Go to the app store, search "MyChart", open the app, select [place], and log in with your MyChart username and password.

## 2024-03-07 ENCOUNTER — Inpatient Hospital Stay

## 2024-03-07 VITALS — BP 130/67 | HR 69 | Temp 98.1°F | Resp 16

## 2024-03-07 DIAGNOSIS — C3432 Malignant neoplasm of lower lobe, left bronchus or lung: Secondary | ICD-10-CM

## 2024-03-07 DIAGNOSIS — Z5111 Encounter for antineoplastic chemotherapy: Secondary | ICD-10-CM | POA: Diagnosis not present

## 2024-03-07 MED ORDER — SODIUM CHLORIDE 0.9 % IV SOLN
100.0000 mg/m2 | Freq: Once | INTRAVENOUS | Status: AC
  Administered 2024-03-07: 200 mg via INTRAVENOUS
  Filled 2024-03-07: qty 10

## 2024-03-07 MED ORDER — SODIUM CHLORIDE 0.9 % IV SOLN: INTRAVENOUS | Status: DC

## 2024-03-07 MED ORDER — DEXAMETHASONE SODIUM PHOSPHATE 10 MG/ML IJ SOLN
10.0000 mg | Freq: Once | INTRAMUSCULAR | Status: AC
  Administered 2024-03-07: 10 mg via INTRAVENOUS
  Filled 2024-03-07: qty 1

## 2024-03-07 NOTE — Progress Notes (Signed)
 Patient has been getting hiccups from treatment- spoke with Dr. Sherrod who advised the patient to use his compazine . If the compazine  does not work the patient is to call and Dr. Sherrod will prescribe thorazine. Discussed with patient who agrees with plan.

## 2024-03-07 NOTE — Patient Instructions (Signed)
 [facility] - A DEPT OF [facility]  Discharge Instructions: Thank you for choosing [facility] to provide your oncology and hematology care.   If you have a lab appointment with the Cancer Center, please go directly to the Cancer Center and check in at the registration area.   Wear comfortable clothing and clothing appropriate for easy access to any Portacath or PICC line.   We strive to give you quality time with your provider. You may need to reschedule your appointment if you arrive late (15 or more minutes).  Arriving late affects you and other patients whose appointments are after yours.  Also, if you miss three or more appointments without notifying the office, you may be dismissed from the clinic at the provider's discretion.      For prescription refill requests, have your pharmacy contact our office and allow 72 hours for refills to be completed.    Today you received the following chemotherapy and/or immunotherapy agents: etoposide      To help prevent nausea and vomiting after your treatment, we encourage you to take your nausea medication as directed.  BELOW ARE SYMPTOMS THAT SHOULD BE REPORTED IMMEDIATELY: *FEVER GREATER THAN 100.4 F (38 C) OR HIGHER *CHILLS OR SWEATING *NAUSEA AND VOMITING THAT IS NOT CONTROLLED WITH YOUR NAUSEA MEDICATION *UNUSUAL SHORTNESS OF BREATH *UNUSUAL BRUISING OR BLEEDING *URINARY PROBLEMS (pain or burning when urinating, or frequent urination) *BOWEL PROBLEMS (unusual diarrhea, constipation, pain near the anus) TENDERNESS IN MOUTH AND THROAT WITH OR WITHOUT PRESENCE OF ULCERS (sore throat, sores in mouth, or a toothache) UNUSUAL RASH, SWELLING OR PAIN  UNUSUAL VAGINAL DISCHARGE OR ITCHING   Items with * indicate a potential emergency and should be followed up as soon as possible or go to the Emergency Department if any problems should occur.  Please show the CHEMOTHERAPY ALERT CARD or IMMUNOTHERAPY  ALERT CARD at check-in to the Emergency Department and triage nurse.  Should you have questions after your visit or need to cancel or reschedule your appointment, please contact [facility] - A DEPT OF [facility]  Dept: [phone]  and follow the prompts.  Office hours are 8:00 a.m. to 4:30 p.m. Monday - Friday. Please note that voicemails left after 4:00 p.m. may not be returned until the following business day.  We are closed weekends and major holidays. You have access to a nurse at all times for urgent questions. Please call the main number to the clinic Dept: [phone] and follow the prompts.   For any non-urgent questions, you may also contact your provider using MyChart. We now offer e-Visits for anyone 40 and older to request care online for non-urgent symptoms. For details visit [email or web address].   Also download the MyChart app! Go to the app store, search "MyChart", open the app, select [place], and log in with your MyChart username and password.

## 2024-03-09 ENCOUNTER — Encounter: Payer: Self-pay | Admitting: Pharmacist

## 2024-03-09 NOTE — Progress Notes (Signed)
 Pharmacy Quality Measure Review  This patient is appearing on a report for being at risk of failing the adherence measure for diabetes medications this calendar year.   Medication: metformin  500mg  Last fill date: 10/03/2023 for 90 day supply per adherence report  Reviewed recent refill history in Dr Annemarie database. Actual last refill date was 02/06/2024 for 90 day supply. Patient has no refills remaining. Next appointment with PCP is 07/20/2024. Will need updated Rx next refill.   Also reviewed losartan  refills - patient filled 90 day supply on 08/26/2023, 11/17/2023 and 01/31/2024.  Atorvastatin  filled for 90 days supply on 10/03/2023 and 12/29/2023.    Insurance report was not up to date. No action needed at this time.  Will follow adherence in 2025. Plan to check prior to next refill to have updated Rx sent in - should be able to get 100 day supply if OK with PCP.   Madelin Ray, PharmD Clinical Pharmacist [facility] Primary Care  Population Health [phone]

## 2024-03-12 ENCOUNTER — Inpatient Hospital Stay

## 2024-03-12 DIAGNOSIS — E781 Pure hyperglyceridemia: Secondary | ICD-10-CM | POA: Diagnosis not present

## 2024-03-12 DIAGNOSIS — C3432 Malignant neoplasm of lower lobe, left bronchus or lung: Secondary | ICD-10-CM

## 2024-03-12 DIAGNOSIS — E785 Hyperlipidemia, unspecified: Secondary | ICD-10-CM | POA: Diagnosis not present

## 2024-03-12 DIAGNOSIS — Z5111 Encounter for antineoplastic chemotherapy: Secondary | ICD-10-CM | POA: Diagnosis not present

## 2024-03-12 LAB — CBC WITH DIFFERENTIAL (CANCER CENTER ONLY)
Abs Immature Granulocytes: 0.11 K/uL — ABNORMAL HIGH (ref 0.00–0.07)
Basophils Absolute: 0.1 K/uL (ref 0.0–0.1)
Basophils Relative: 1 %
Eosinophils Absolute: 0 K/uL (ref 0.0–0.5)
Eosinophils Relative: 0 %
HCT: 30.7 % — ABNORMAL LOW (ref 39.0–52.0)
Hemoglobin: 10.8 g/dL — ABNORMAL LOW (ref 13.0–17.0)
Immature Granulocytes: 2 %
Lymphocytes Relative: 22 %
Lymphs Abs: 1.4 K/uL (ref 0.7–4.0)
MCH: 32.1 pg (ref 26.0–34.0)
MCHC: 35.2 g/dL (ref 30.0–36.0)
MCV: 91.4 fL (ref 80.0–100.0)
Monocytes Absolute: 0.1 K/uL (ref 0.1–1.0)
Monocytes Relative: 1 %
Neutro Abs: 4.8 K/uL (ref 1.7–7.7)
Neutrophils Relative %: 74 %
Platelet Count: 175 K/uL (ref 150–400)
RBC: 3.36 MIL/uL — ABNORMAL LOW (ref 4.22–5.81)
RDW: 14 % (ref 11.5–15.5)
Smear Review: NORMAL
WBC Count: 6.5 K/uL (ref 4.0–10.5)
nRBC: 0 % (ref 0.0–0.2)

## 2024-03-12 LAB — CMP (CANCER CENTER ONLY)
ALT: 19 U/L (ref 0–44)
AST: 16 U/L (ref 15–41)
Albumin: 4.4 g/dL (ref 3.5–5.0)
Alkaline Phosphatase: 53 U/L (ref 38–126)
Anion gap: 7 (ref 5–15)
BUN: 29 mg/dL — ABNORMAL HIGH (ref 8–23)
CO2: 27 mmol/L (ref 22–32)
Calcium: 9.4 mg/dL (ref 8.9–10.3)
Chloride: 106 mmol/L (ref 98–111)
Creatinine: 1.06 mg/dL (ref 0.61–1.24)
GFR, Estimated: >60 mL/min (ref >60)
Glucose, Bld: 128 mg/dL — ABNORMAL HIGH (ref 70–99)
Potassium: 4.2 mmol/L (ref 3.5–5.1)
Sodium: 140 mmol/L (ref 135–145)
Total Bilirubin: 0.4 mg/dL (ref 0.0–1.2)
Total Protein: 7 g/dL (ref 6.5–8.1)

## 2024-03-12 LAB — LIPID PANEL
Chol/HDL Ratio: 4.7 ratio (ref 0.0–5.0)
Cholesterol, Total: 99 mg/dL — ABNORMAL LOW (ref 100–199)
HDL: 21 mg/dL — ABNORMAL LOW (ref >39)
LDL Chol Calc (NIH): 31 mg/dL (ref 0–99)
Triglycerides: 318 mg/dL — ABNORMAL HIGH (ref 0–149)
VLDL Cholesterol Cal: 47 mg/dL — ABNORMAL HIGH (ref 5–40)

## 2024-03-12 NOTE — Progress Notes (Signed)
 " Cardiology Office Note   Date:  03/14/2024  ID:  Gregory Jensen, Gregory Jensen August 10, 1956, MRN [id number] PCP: [name], MD  [facility] Providers Cardiologist:  None     PMH Dyslipidemia Coronary calcification Aortic atherosclerosis Type 2 diabetes Tobacco use > 50 pack year history Prostate cancer Sleep apnea Lung cancer s/p right upper lobectomy Now in left lobe being treated with chemo  Referred to Advanced Lipid disorders clinic and seen by Dr. Mona 12/23/2021.  History of type 2 diabetes with neuropathy, dyslipidemia, sleep apnea, and ongoing tobacco use more than 50 pack years.  Recent high-resolution CT scan for lung cancer screening that revealed two-vessel coronary calcification and aortic atherosclerosis.  No clinical cardiac events.  History of heart disease in his sister and his father.  Longstanding history of high cholesterol, particularly high triglycerides since he was a teenager.  In the past his triglycerides have been in the thousands.  He had some benefit from fenofibrate .  Most recent lipids at that time revealed total cholesterol 122, HDL 23, triglycerides 380, and direct LDL 46.  Hemoglobin A1c was 7.7%.  Therapy was atorvastatin  80 mg and fenofibrate  160 mg daily.  He thought the combination had caused worsening muscle aches and he subsequently discontinued the fenofibrate  with improvement in symptoms.  BP mildly elevated.  He was advised to decrease atorvastatin  to 40 mg daily and restart fenofibrate  at 145 mg daily.  Plan to recheck lipids in 3 to 4 months.  At follow-up visit 04/13/2022, A1c up to 8.5% from 7.7%.  Triglycerides had improved to 322, HDL 21, and LDL 51.  He was found to have hypermetabolic lung nodule confirmed with PET/CT and was scheduled for robotic assisted thoracotomy and excision with Dr. Shyrl.  He underwent Myoview  prior to surgery which was low risk.  Jardiance  10 mg daily was added for elevated A1C and losartan  25 mg daily added  for BP control as well as being kidney protective in the setting of diabetes.   Last clinic visit was 01/18/23 with Dr. Mona at which time total cholesterol was 109, triglycerides were 218, HDL was 22 and LDL 52. Not very physically active due to peripheral neuropathy. He remained on atorvastatin  and fenofibrate . He was encouraged to increase physical activity and return in 1 year for follow-up.    History of Present Illness Discussed the use of AI scribe software for clinical note transcription with the patient, who gave verbal consent to proceed. History of Present Illness Gregory Jensen is a very pleasant 68 year old male  who presents for follow-up of dyslipidemia. He is currently ongoing chemotherapy treatment for left lung cancer. Prior right lobectomy. He experiences a sensation of feeling his heartbeat in his head with minimal exertion, such as showering or dressing. Occasional home blood pressure readings are high, around 165-170/80, though not consistently elevated during medical appointments. Lightheadedness began with chemotherapy and occurs occasionally. His blood sugar is generally controlled, usually under 200. Triglycerides were previously over 1000 but are now in the 300-400 range with medication. Caffeine intake includes one Doctor Pepper daily and two to three cups of coffee. He has increased his water intake to 16-32 ounces daily.  ROS: See HPI  Studies Reviewed      No results found for: LIPOA  Risk Assessment/Calculations           Physical Exam VS:  BP 124/64 (BP Location: Right Arm, Patient Position: Sitting, Cuff Size: Normal)   Pulse 94  Resp 16   Ht 5' 8 (1.727 m)   Wt 180 lb (81.6 kg)   SpO2 97%   BMI 27.37 kg/m    Wt Readings from Last 3 Encounters:  03/14/24 180 lb (81.6 kg)  03/05/24 178 lb (80.7 kg)  02/13/24 177 lb 14.4 oz (80.7 kg)    GEN: Well nourished, well developed in no acute distress NECK: No JVD; No carotid bruits CARDIAC: RRR, no  murmurs, rubs, gallops RESPIRATORY:  Clear to auscultation without rales, wheezing or rhonchi  ABDOMEN: Soft, non-tender, non-distended EXTREMITIES:  No edema; No deformity   Assessment & Plan Dyslipidemia LDL goal < 70 Hypertriglyceridemia  Lipid panel completed 03/12/24 with total cholesterol 99, triglycerides 318, HDL 21, LDL-C 31. Prior history of triglycerides > 1400. Tolerating fenofibrate , atorvastatin  without concerning side effects. LDL is well controlled -Continue atorvastatin  and fenofibrate  -Encouraged heart healthy diet specifically reducing intake of soda, sugar, and other simple carbohydrates  Coronary calcification Noted on prior CT. Stress test 04/15/2022 low risk. She denies chest pain, dyspnea, or other symptoms concerning for angina.  No indication for further ischemic evaluation at this time. Continue atorvastatin .   Lightheadedness Notes occasional lightheadedness but no presyncope or syncope.  BP has not been soft to his awareness.  BP well-controlled in clinic.  - Will get carotid ultrasound to evaluate for carotid stenosis that may be contributing  Elevated HR HR 95-100 bpm on exam. Has not been aware of tachycardia until today. Advised no concern with mildly elevated HR.  -Reduce caffeine intake and increase water consumption to at least 48 oz/day   Lung cancer S/p right lung resection. Currently undergoing chemotherapy for left lung cancer. 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 Unfortunately, he continues to smoke. No acute concerns today. -Management per oncology        Dispo: 1 year with Dr. Mona or me  Signed, Rosaline Bane, NP-C "

## 2024-03-13 ENCOUNTER — Ambulatory Visit: Payer: Self-pay | Admitting: Internal Medicine

## 2024-03-14 ENCOUNTER — Encounter (HOSPITAL_BASED_OUTPATIENT_CLINIC_OR_DEPARTMENT_OTHER): Payer: Self-pay | Admitting: Nurse Practitioner

## 2024-03-14 ENCOUNTER — Ambulatory Visit (HOSPITAL_BASED_OUTPATIENT_CLINIC_OR_DEPARTMENT_OTHER): Admitting: Nurse Practitioner

## 2024-03-14 VITALS — BP 124/64 | HR 94 | Resp 16 | Ht 68.0 in | Wt 180.0 lb

## 2024-03-14 DIAGNOSIS — E781 Pure hyperglyceridemia: Secondary | ICD-10-CM | POA: Diagnosis not present

## 2024-03-14 DIAGNOSIS — R000 Tachycardia, unspecified: Secondary | ICD-10-CM

## 2024-03-14 DIAGNOSIS — I2510 Atherosclerotic heart disease of native coronary artery without angina pectoris: Secondary | ICD-10-CM

## 2024-03-14 DIAGNOSIS — C3432 Malignant neoplasm of lower lobe, left bronchus or lung: Secondary | ICD-10-CM

## 2024-03-14 DIAGNOSIS — R42 Dizziness and giddiness: Secondary | ICD-10-CM | POA: Diagnosis not present

## 2024-03-14 DIAGNOSIS — E785 Hyperlipidemia, unspecified: Secondary | ICD-10-CM

## 2024-03-14 NOTE — Patient Instructions (Signed)
 Medication Instructions:   Your physician recommends that you continue on your current medications as directed. Please refer to the Current Medication list given to you today.   *If you need a refill on your cardiac medications before your next appointment, please call your pharmacy*  Lab Work:  None ordered.  If you have labs (blood work) drawn today and your tests are completely normal, you will receive your results only by: MyChart Message (if you have MyChart) OR A paper copy in the mail If you have any lab test that is abnormal or we need to change your treatment, we will call you to review the results.  Testing/Procedures:  Your physician has requested that you have a carotid duplex. This test is an ultrasound of the carotid arteries in your neck. It looks at blood flow through these arteries that supply the brain with blood. Allow one hour for this exam. There are no restrictions or special instructions.   Follow-Up: At [facility], you and your health needs are our priority.  As part of our continuing mission to provide you with exceptional heart care, our providers are all part of one team.  This team includes your primary Cardiologist (physician) and Advanced Practice Providers or APPs (Physician Assistants and Nurse Practitioners) who all work together to provide you with the care you need, when you need it.  Your next appointment:   1 year(s)  Provider:   Rosaline Bane, NP    We recommend signing up for the patient portal called MyChart.  Sign up information is provided on this After Visit Summary.  MyChart is used to connect with patients for Virtual Visits (Telemedicine).  Patients are able to view lab/test results, encounter notes, upcoming appointments, etc.  Non-urgent messages can be sent to your provider as well.   To learn more about what you can do with MyChart, go to [email or web address].   Other Instructions  Your physician wants you to  follow-up in: 1 year.  You will receive a reminder letter in the mail two months in advance. If you don't receive a letter, please call our office to schedule the follow-up appointment.

## 2024-03-15 ENCOUNTER — Encounter (HOSPITAL_BASED_OUTPATIENT_CLINIC_OR_DEPARTMENT_OTHER): Payer: Self-pay | Admitting: Nurse Practitioner

## 2024-03-19 ENCOUNTER — Inpatient Hospital Stay: Attending: Internal Medicine

## 2024-03-19 DIAGNOSIS — T451X5A Adverse effect of antineoplastic and immunosuppressive drugs, initial encounter: Secondary | ICD-10-CM | POA: Diagnosis not present

## 2024-03-19 DIAGNOSIS — Z902 Acquired absence of lung [part of]: Secondary | ICD-10-CM | POA: Insufficient documentation

## 2024-03-19 DIAGNOSIS — Z5189 Encounter for other specified aftercare: Secondary | ICD-10-CM | POA: Insufficient documentation

## 2024-03-19 DIAGNOSIS — Z923 Personal history of irradiation: Secondary | ICD-10-CM | POA: Insufficient documentation

## 2024-03-19 DIAGNOSIS — C3411 Malignant neoplasm of upper lobe, right bronchus or lung: Secondary | ICD-10-CM | POA: Insufficient documentation

## 2024-03-19 DIAGNOSIS — Z5111 Encounter for antineoplastic chemotherapy: Secondary | ICD-10-CM | POA: Diagnosis not present

## 2024-03-19 DIAGNOSIS — G620 Drug-induced polyneuropathy: Secondary | ICD-10-CM | POA: Insufficient documentation

## 2024-03-19 DIAGNOSIS — C3432 Malignant neoplasm of lower lobe, left bronchus or lung: Secondary | ICD-10-CM | POA: Insufficient documentation

## 2024-03-19 LAB — CBC WITH DIFFERENTIAL (CANCER CENTER ONLY)
Abs Immature Granulocytes: 0.02 K/uL (ref 0.00–0.07)
Basophils Absolute: 0 K/uL (ref 0.0–0.1)
Basophils Relative: 1 %
Eosinophils Absolute: 0.1 K/uL (ref 0.0–0.5)
Eosinophils Relative: 3 %
HCT: 30.3 % — ABNORMAL LOW (ref 39.0–52.0)
Hemoglobin: 10.6 g/dL — ABNORMAL LOW (ref 13.0–17.0)
Immature Granulocytes: 1 %
Lymphocytes Relative: 47 %
Lymphs Abs: 1.4 K/uL (ref 0.7–4.0)
MCH: 32.6 pg (ref 26.0–34.0)
MCHC: 35 g/dL (ref 30.0–36.0)
MCV: 93.2 fL (ref 80.0–100.0)
Monocytes Absolute: 0.3 K/uL (ref 0.1–1.0)
Monocytes Relative: 11 %
Neutro Abs: 1.1 K/uL — ABNORMAL LOW (ref 1.7–7.7)
Neutrophils Relative %: 37 %
Platelet Count: 75 K/uL — ABNORMAL LOW (ref 150–400)
RBC: 3.25 MIL/uL — ABNORMAL LOW (ref 4.22–5.81)
RDW: 14.8 % (ref 11.5–15.5)
WBC Count: 2.9 K/uL — ABNORMAL LOW (ref 4.0–10.5)
nRBC: 0 % (ref 0.0–0.2)

## 2024-03-19 LAB — CMP (CANCER CENTER ONLY)
ALT: 20 U/L (ref 0–44)
AST: 17 U/L (ref 15–41)
Albumin: 4.6 g/dL (ref 3.5–5.0)
Alkaline Phosphatase: 54 U/L (ref 38–126)
Anion gap: 6 (ref 5–15)
BUN: 18 mg/dL (ref 8–23)
CO2: 26 mmol/L (ref 22–32)
Calcium: 9.6 mg/dL (ref 8.9–10.3)
Chloride: 108 mmol/L (ref 98–111)
Creatinine: 0.99 mg/dL (ref 0.61–1.24)
GFR, Estimated: >60 mL/min (ref >60)
Glucose, Bld: 140 mg/dL — ABNORMAL HIGH (ref 70–99)
Potassium: 4.5 mmol/L (ref 3.5–5.1)
Sodium: 140 mmol/L (ref 135–145)
Total Bilirubin: 0.4 mg/dL (ref 0.0–1.2)
Total Protein: 6.9 g/dL (ref 6.5–8.1)

## 2024-03-23 MED FILL — Fosaprepitant Dimeglumine For IV Infusion 150 MG (Base Eq): INTRAVENOUS | Qty: 5 | Status: AC

## 2024-03-26 ENCOUNTER — Inpatient Hospital Stay

## 2024-03-26 ENCOUNTER — Inpatient Hospital Stay (HOSPITAL_BASED_OUTPATIENT_CLINIC_OR_DEPARTMENT_OTHER): Admitting: Internal Medicine

## 2024-03-26 VITALS — BP 139/79 | HR 86 | Temp 98.2°F | Resp 17 | Ht 68.0 in | Wt 178.0 lb

## 2024-03-26 DIAGNOSIS — C3432 Malignant neoplasm of lower lobe, left bronchus or lung: Secondary | ICD-10-CM

## 2024-03-26 DIAGNOSIS — C3490 Malignant neoplasm of unspecified part of unspecified bronchus or lung: Secondary | ICD-10-CM | POA: Diagnosis not present

## 2024-03-26 DIAGNOSIS — Z5111 Encounter for antineoplastic chemotherapy: Secondary | ICD-10-CM | POA: Diagnosis not present

## 2024-03-26 LAB — CMP (CANCER CENTER ONLY)
ALT: 20 U/L (ref 0–44)
AST: 19 U/L (ref 15–41)
Albumin: 4.6 g/dL (ref 3.5–5.0)
Alkaline Phosphatase: 57 U/L (ref 38–126)
Anion gap: 9 (ref 5–15)
BUN: 20 mg/dL (ref 8–23)
CO2: 21 mmol/L — ABNORMAL LOW (ref 22–32)
Calcium: 9.1 mg/dL (ref 8.9–10.3)
Chloride: 108 mmol/L (ref 98–111)
Creatinine: 1.06 mg/dL (ref 0.61–1.24)
GFR, Estimated: >60 mL/min (ref >60)
Glucose, Bld: 153 mg/dL — ABNORMAL HIGH (ref 70–99)
Potassium: 3.9 mmol/L (ref 3.5–5.1)
Sodium: 138 mmol/L (ref 135–145)
Total Bilirubin: 0.5 mg/dL (ref 0.0–1.2)
Total Protein: 7 g/dL (ref 6.5–8.1)

## 2024-03-26 LAB — CBC WITH DIFFERENTIAL (CANCER CENTER ONLY)
Abs Immature Granulocytes: 0.21 K/uL — ABNORMAL HIGH (ref 0.00–0.07)
Basophils Absolute: 0.1 K/uL (ref 0.0–0.1)
Basophils Relative: 1 %
Eosinophils Absolute: 0.1 K/uL (ref 0.0–0.5)
Eosinophils Relative: 1 %
HCT: 30.9 % — ABNORMAL LOW (ref 39.0–52.0)
Hemoglobin: 10.9 g/dL — ABNORMAL LOW (ref 13.0–17.0)
Immature Granulocytes: 4 %
Lymphocytes Relative: 26 %
Lymphs Abs: 1.3 K/uL (ref 0.7–4.0)
MCH: 32.6 pg (ref 26.0–34.0)
MCHC: 35.3 g/dL (ref 30.0–36.0)
MCV: 92.5 fL (ref 80.0–100.0)
Monocytes Absolute: 0.7 K/uL (ref 0.1–1.0)
Monocytes Relative: 13 %
Neutro Abs: 2.8 K/uL (ref 1.7–7.7)
Neutrophils Relative %: 55 %
Platelet Count: 229 K/uL (ref 150–400)
RBC: 3.34 MIL/uL — ABNORMAL LOW (ref 4.22–5.81)
RDW: 15.9 % — ABNORMAL HIGH (ref 11.5–15.5)
WBC Count: 5.2 K/uL (ref 4.0–10.5)
nRBC: 0.4 % — ABNORMAL HIGH (ref 0.0–0.2)

## 2024-03-26 MED ORDER — SODIUM CHLORIDE 0.9 % IV SOLN
492.5000 mg | Freq: Once | INTRAVENOUS | Status: AC
  Administered 2024-03-26 (×2): 490 mg via INTRAVENOUS
  Filled 2024-03-26: qty 49

## 2024-03-26 MED ORDER — SODIUM CHLORIDE 0.9 % IV SOLN: INTRAVENOUS | Status: DC

## 2024-03-26 MED ORDER — SODIUM CHLORIDE 0.9 % IV SOLN
100.0000 mg/m2 | Freq: Once | INTRAVENOUS | Status: AC
  Administered 2024-03-26 (×2): 200 mg via INTRAVENOUS
  Filled 2024-03-26: qty 10

## 2024-03-26 MED ORDER — FOSAPREPITANT DIMEGLUMINE INJECTION 150 MG
150.0000 mg | Freq: Once | INTRAVENOUS | Status: AC
  Administered 2024-03-26 (×2): 150 mg via INTRAVENOUS
  Filled 2024-03-26: qty 150

## 2024-03-26 MED ORDER — DEXAMETHASONE SODIUM PHOSPHATE 10 MG/ML IJ SOLN
10.0000 mg | Freq: Once | INTRAMUSCULAR | Status: AC
  Administered 2024-03-26 (×2): 10 mg via INTRAVENOUS
  Filled 2024-03-26: qty 1

## 2024-03-26 MED ORDER — PALONOSETRON HCL INJECTION 0.25 MG/5ML
0.2500 mg | Freq: Once | INTRAVENOUS | Status: AC
  Administered 2024-03-26 (×2): 0.25 mg via INTRAVENOUS
  Filled 2024-03-26: qty 5

## 2024-03-26 NOTE — Progress Notes (Signed)
 [facility] [phone]   [phone]  OFFICE PROGRESS NOTE  Gregory Worth HERO, MD [address] [place] [address]  DIAGNOSIS:   1) stage IA (T1c, N0, M0) small cell lung cancer presented with left lower lobe lung nodule diagnosed in May 2025.  2) Stage IA (T1c, N0, M0) non-small cell lung cancer, squamous cell carcinoma presented with right upper lobe in July 2023   PRIOR THERAPY: Status post right upper lobectomy with lymph node sampling under the care of Dr. Shyrl on 05/10/2022 with tumor size of 2.3 cm.   CURRENT THERAPY: Systemic chemotherapy with carboplatin  for AUC of 5 on day 1 and oh etoposide  100 Mg/M2 on days 1, 2 and 3 every 3 weeks.  First dose 01/23/2024.  This is concurrent with SBRT to the left lower lobe lung nodule under the care of Dr. Dewey.  It is post 3 cycles.  INTERVAL HISTORY: Gregory Jensen 68 y.o. male returns to the clinic today for follow-up visit. Discussed the use of AI scribe software for clinical note transcription with the patient, who gave verbal consent to proceed.  History of Present Illness Gregory Jensen is a 68 year old male with stage 1A limited stage small cell lung cancer who presents for evaluation before starting cycle number four of chemotherapy.  He was diagnosed with stage 1A limited stage small cell lung cancer in May 2025 and is undergoing systemic chemotherapy with carboplatin  and etoposide  every three weeks. He has completed three cycles and is here for evaluation before starting the fourth cycle.  He experiences worsening neuropathy with each cycle and occasional cramping in the epigastric area, which eases off and goes away. No nausea, vomiting, diarrhea, chest pain, or breathing issues. He feels tired but otherwise 'pretty good'.  His past medical history includes stage 1A non-small cell lung cancer, specifically squamous cell carcinoma involving the right upper lobe, diagnosed in  July 2023. He underwent a right upper lobectomy with lymph node sampling and SBRT in 2023 for a left lower lobe lung nodule.    MEDICAL HISTORY: Past Medical History:  Diagnosis Date   Cancer (HCC) 4 years ago   Prostate Cancer , Lung Cancer   Diabetes mellitus without complication (HCC)    type 2   Hyperlipidemia    Hypertension    Lung cancer (HCC)    Left Lower Lobe Lung   Neuromuscular disorder (HCC)    neuropathy   Neuropathy    in legs causing balance issues per patient   Sleep apnea    Smoker     ALLERGIES:  is allergic to jardiance  [empagliflozin ].  MEDICATIONS:  Current Outpatient Medications  Medication Sig Dispense Refill   aspirin  81 MG EC tablet Take 81 mg by mouth in the morning.     atorvastatin  (LIPITOR) 40 MG tablet TAKE 1 TABLET BY MOUTH EVERY DAY 90 tablet 0   Blood Glucose Monitoring Suppl (ONE TOUCH ULTRA 2) w/Device KIT Used TID to check glucose or PRN Code E11.9 1 kit 0   cyanocobalamin  1000 MCG tablet Take 1,000 mcg by mouth daily.     fenofibrate  (TRICOR ) 145 MG tablet TAKE 1 TABLET BY MOUTH EVERY DAY 90 tablet 0   gabapentin  (NEURONTIN ) 100 MG capsule Take 1-2 capsules (100-200 mg total) by mouth at bedtime. 60 capsule 1   glucose blood (ONETOUCH VERIO) test strip 1 each by Other route daily as needed for other. Use as instructed 100 each  4   ibuprofen (ADVIL) 200 MG tablet Take 400-600 mg by mouth daily as needed (pain.).     losartan  (COZAAR ) 50 MG tablet TAKE 1 TABLET BY MOUTH EVERY DAY 90 tablet 0   metFORMIN  (GLUCOPHAGE ) 500 MG tablet TAKE 1 TABLET BY MOUTH TWICE A DAY WITH FOOD 180 tablet 1   ondansetron  (ZOFRAN ) 8 MG tablet Take 1 tablet (8 mg total) by mouth every 8 (eight) hours as needed for nausea or vomiting. Start on third day after chemotherapy. 30 tablet 1   OneTouch Delica Lancets 33G MISC 1 each by Does not apply route daily as needed. 100 each 4   oxyCODONE -acetaminophen  (PERCOCET/ROXICET) 5-325 MG tablet Take 1 tablet by mouth every  8 (eight) hours as needed for severe pain (pain score 7-10). 30 tablet 0   prochlorperazine  (COMPAZINE ) 10 MG tablet Take 1 tablet (10 mg total) by mouth every 6 (six) hours as needed for nausea or vomiting (Nausea or vomiting). 30 tablet 1   No current facility-administered medications for this visit.    SURGICAL HISTORY:  Past Surgical History:  Procedure Laterality Date   BACK SURGERY  15 years ago   BRONCHIAL BIOPSY  05/10/2022   Procedure: BRONCHIAL BIOPSIES;  Surgeon: Shelah Lamar RAMAN, MD;  Location: [facility] ENDOSCOPY;  Service: Pulmonary;;   BRONCHIAL BIOPSY  12/26/2023   Procedure: BRONCHOSCOPY, WITH BIOPSY;  Surgeon: Shelah Lamar RAMAN, MD;  Location: [facility] ENDOSCOPY;  Service: Pulmonary;;   BRONCHIAL BRUSHINGS  05/10/2022   Procedure: BRONCHIAL BRUSHINGS;  Surgeon: Shelah Lamar RAMAN, MD;  Location: [facility] ENDOSCOPY;  Service: Pulmonary;;   BRONCHIAL BRUSHINGS  12/26/2023   Procedure: BRONCHOSCOPY, WITH BRUSH BIOPSY;  Surgeon: Shelah Lamar RAMAN, MD;  Location: [facility] ENDOSCOPY;  Service: Pulmonary;;   BRONCHIAL NEEDLE ASPIRATION BIOPSY  05/10/2022   Procedure: BRONCHIAL NEEDLE ASPIRATION BIOPSIES;  Surgeon: Shelah Lamar RAMAN, MD;  Location: [facility] ENDOSCOPY;  Service: Pulmonary;;   BRONCHIAL NEEDLE ASPIRATION BIOPSY  12/26/2023   Procedure: BRONCHOSCOPY, WITH NEEDLE ASPIRATION BIOPSY;  Surgeon: Shelah Lamar RAMAN, MD;  Location: [facility] ENDOSCOPY;  Service: Pulmonary;;   COLONOSCOPY  2007   pt does not know MD name/normal exam per pt.   FIDUCIAL MARKER PLACEMENT  05/10/2022   Procedure: FIDUCIAL DYE MARKING;  Surgeon: Shelah Lamar RAMAN, MD;  Location: [facility] ENDOSCOPY;  Service: Pulmonary;;   FIDUCIAL MARKER PLACEMENT  12/26/2023   Procedure: INSERTION, FIDUCIAL MARKERS;  Surgeon: Shelah Lamar RAMAN, MD;  Location: [facility] ENDOSCOPY;  Service: Pulmonary;;   INTERCOSTAL NERVE BLOCK Right 05/10/2022   Procedure: INTERCOSTAL NERVE BLOCK;  Surgeon: Shyrl Linnie KIDD, MD;  Location: [facility] OR;  Service: Thoracic;  Laterality: Right;    NODE DISSECTION Right 05/10/2022   Procedure: NODE DISSECTION;  Surgeon: Shyrl Linnie KIDD, MD;  Location: [facility] OR;  Service: Thoracic;  Laterality: Right;   PROSTATECTOMY  4 years ago   VIDEO BRONCHOSCOPY WITH ENDOBRONCHIAL NAVIGATION Left 12/26/2023   Procedure: VIDEO BRONCHOSCOPY WITH ENDOBRONCHIAL NAVIGATION;  Surgeon: Shelah Lamar RAMAN, MD;  Location: [facility] ENDOSCOPY;  Service: Pulmonary;  Laterality: Left;    REVIEW OF SYSTEMS:  A comprehensive review of systems was negative except for: Constitutional: positive for fatigue Neurological: positive for paresthesia   PHYSICAL EXAMINATION: General appearance: alert, cooperative, fatigued, and no distress Head: Normocephalic, without obvious abnormality, atraumatic Neck: no adenopathy, no JVD, supple, symmetrical, trachea midline, and thyroid  not enlarged, symmetric, no tenderness/mass/nodules Lymph nodes: Cervical, supraclavicular, and axillary nodes normal. Resp: clear to auscultation bilaterally Back: symmetric, no curvature. ROM normal. No CVA tenderness.  Cardio: regular rate and rhythm, S1, S2 normal, no murmur, click, rub or gallop GI: soft, non-tender; bowel sounds normal; no masses,  no organomegaly Extremities: extremities normal, atraumatic, no cyanosis or edema  ECOG PERFORMANCE STATUS: 1 - Symptomatic but completely ambulatory  Blood pressure 139/79, pulse 86, temperature 98.2 F (36.8 C), temperature source Temporal, resp. rate 17, height 5' 8 (1.727 m), weight 178 lb (80.7 kg), SpO2 98%.  LABORATORY DATA: Lab Results  Component Value Date   WBC 2.9 (L) 03/19/2024   HGB 10.6 (L) 03/19/2024   HCT 30.3 (L) 03/19/2024   MCV 93.2 03/19/2024   PLT 75 (L) 03/19/2024      Chemistry      Component Value Date/Time   NA 140 03/19/2024 1524   NA 138 04/27/2022 0847   K 4.5 03/19/2024 1524   CL 108 03/19/2024 1524   CO2 26 03/19/2024 1524   BUN 18 03/19/2024 1524   BUN 19 04/27/2022 0847   CREATININE 0.99 03/19/2024 1524    CREATININE 0.97 07/23/2020 0827      Component Value Date/Time   CALCIUM  9.6 03/19/2024 1524   ALKPHOS 54 03/19/2024 1524   AST 17 03/19/2024 1524   ALT 20 03/19/2024 1524   BILITOT 0.4 03/19/2024 1524       RADIOGRAPHIC STUDIES: No results found.   ASSESSMENT AND PLAN: This is a very pleasant 69 years old white male with:  1) stage Ia (T1c, N0, M0) small cell lung cancer presented with left lower lobe lung nodule diagnosed in May 2025. 2) History of stage IA (T1c, N0, M0) non-small cell lung cancer, squamous cell carcinoma presented with right upper lobe in July 2023 status post right upper lobectomy with lymph node sampling under the care of Dr. Shyrl on 05/10/2022 with tumor size of 2.3 cm.  He had repeat PET scan performed recently that showed hypermetabolic left lower lobe lung nodule with no other evidence of metastatic disease to the mediastinal lymph node or distant metastasis. He had repeat bronchoscopy with biopsy of the left lower lobe lung nodule and unfortunately the final pathology was consistent with a small cell carcinoma. He started systemic chemotherapy with carboplatin  for AUC of 5 on day 1 and etoposide  100 Mg/M2 on days 1, 2 and 3 every 3 weeks for 4 cycles concurrent with SBRT to the left lower lobe lung nodule.  First dose was 01/23/2024 He is status post 3 cycle of treatment.  The patient has been tolerating his treatment fairly well. Assessment and Plan Assessment & Plan Limited stage small cell lung cancer, left lower lobe, on chemotherapy Stage 1A limited stage small cell lung cancer in the left lower lobe, diagnosed in May 2025. Currently undergoing systemic chemotherapy with carboplatin  and etoposide  every three weeks. Completed three cycles and is here for evaluation before starting the fourth cycle. No significant side effects reported except for fatigue and mild abdominal cramping. No nausea, vomiting, diarrhea, chest pain, or breathing issues reported.  Blood counts are adequate for treatment. - Administer fourth cycle of chemotherapy with carboplatin  and etoposide . - Order chest CT scan in three weeks to evaluate treatment response.  Chemotherapy-induced peripheral neuropathy Experiencing worsening peripheral neuropathy with each chemotherapy cycle. He was advised to call immediately if he has any other concerning symptoms in the interval. The patient voices understanding of current disease status and treatment options and is in agreement with the current care plan.  All questions were answered. The patient knows to call the clinic with  any problems, questions or concerns. We can certainly see the patient much sooner if necessary.  The total time spent in the appointment was 20 minutes.  Disclaimer: This note was dictated with voice recognition software. Similar sounding words can inadvertently be transcribed and may not be corrected upon review.

## 2024-03-26 NOTE — Progress Notes (Signed)
 Nutrition Follow-up:  Patient with stage I non -small cell lung cancer.  Receiving carboplatin  and etoposide .    Met with patient during infusion. Reports that his appetite is doing good.  Bought some of the ensure max protein shakes, drinks at times when intake is lower.  Denies nutrition impact symptoms at this time.      Medications: reviewed  Labs: reviewed  Anthropometrics:   Weight 178 lb, stable  178 lb on 7/21 UBW of 183-186 lb   NUTRITION DIAGNOSIS: Inadequate oral intake improved   INTERVENTION:  Continue oral nutrition supplements. Encouraged well balanced diet including lean protein foods, vegetables, fruits and whole grains.      NEXT VISIT: as needed  Madisson Kulaga B. Dasie SOLON, CSO, LDN Registered Dietitian [phone]

## 2024-03-26 NOTE — Patient Instructions (Signed)
 [facility] - A DEPT OF [facility]  Discharge Instructions: Thank you for choosing [facility] to provide your oncology and hematology care.   If you have a lab appointment with the Cancer Center, please go directly to the Cancer Center and check in at the registration area.   Wear comfortable clothing and clothing appropriate for easy access to any Portacath or PICC line.   We strive to give you quality time with your provider. You may need to reschedule your appointment if you arrive late (15 or more minutes).  Arriving late affects you and other patients whose appointments are after yours.  Also, if you miss three or more appointments without notifying the office, you may be dismissed from the clinic at the provider's discretion.      For prescription refill requests, have your pharmacy contact our office and allow 72 hours for refills to be completed.    Today you received the following chemotherapy and/or immunotherapy agents: Carboplatin/Etoposide      To help prevent nausea and vomiting after your treatment, we encourage you to take your nausea medication as directed.  BELOW ARE SYMPTOMS THAT SHOULD BE REPORTED IMMEDIATELY: *FEVER GREATER THAN 100.4 F (38 C) OR HIGHER *CHILLS OR SWEATING *NAUSEA AND VOMITING THAT IS NOT CONTROLLED WITH YOUR NAUSEA MEDICATION *UNUSUAL SHORTNESS OF BREATH *UNUSUAL BRUISING OR BLEEDING *URINARY PROBLEMS (pain or burning when urinating, or frequent urination) *BOWEL PROBLEMS (unusual diarrhea, constipation, pain near the anus) TENDERNESS IN MOUTH AND THROAT WITH OR WITHOUT PRESENCE OF ULCERS (sore throat, sores in mouth, or a toothache) UNUSUAL RASH, SWELLING OR PAIN  UNUSUAL VAGINAL DISCHARGE OR ITCHING   Items with * indicate a potential emergency and should be followed up as soon as possible or go to the Emergency Department if any problems should occur.  Please show the CHEMOTHERAPY ALERT CARD or  IMMUNOTHERAPY ALERT CARD at check-in to the Emergency Department and triage nurse.  Should you have questions after your visit or need to cancel or reschedule your appointment, please contact [facility] - A DEPT [facility]  Dept: [phone]  and follow the prompts.  Office hours are 8:00 a.m. to 4:30 p.m. Monday - Friday. Please note that voicemails left after 4:00 p.m. may not be returned until the following business day.  We are closed weekends and major holidays. You have access to a nurse at all times for urgent questions. Please call the main number to the clinic Dept: [phone] and follow the prompts.   For any non-urgent questions, you may also contact your provider using MyChart. We now offer e-Visits for anyone 14 and older to request care online for non-urgent symptoms. For details visit [email or web address].   Also download the MyChart app! Go to the app store, search "MyChart", open the app, select [facility], and log in with your MyChart username and password.

## 2024-03-27 ENCOUNTER — Inpatient Hospital Stay

## 2024-03-27 ENCOUNTER — Telehealth: Payer: Self-pay | Admitting: Internal Medicine

## 2024-03-27 VITALS — BP 117/72 | HR 73 | Temp 97.9°F | Resp 16

## 2024-03-27 DIAGNOSIS — C3432 Malignant neoplasm of lower lobe, left bronchus or lung: Secondary | ICD-10-CM

## 2024-03-27 DIAGNOSIS — Z5111 Encounter for antineoplastic chemotherapy: Secondary | ICD-10-CM | POA: Diagnosis not present

## 2024-03-27 MED ORDER — SODIUM CHLORIDE 0.9 % IV SOLN: INTRAVENOUS | Status: DC

## 2024-03-27 MED ORDER — DEXAMETHASONE SODIUM PHOSPHATE 10 MG/ML IJ SOLN
10.0000 mg | Freq: Once | INTRAMUSCULAR | Status: AC
  Administered 2024-03-27 (×2): 10 mg via INTRAVENOUS
  Filled 2024-03-27: qty 1

## 2024-03-27 MED ORDER — SODIUM CHLORIDE 0.9 % IV SOLN
100.0000 mg/m2 | Freq: Once | INTRAVENOUS | Status: AC
  Administered 2024-03-27 (×2): 200 mg via INTRAVENOUS
  Filled 2024-03-27: qty 10

## 2024-03-27 NOTE — Telephone Encounter (Signed)
 Left the patient a voicemail with the scheduled appointment details around a CT scan expected date.

## 2024-03-27 NOTE — Patient Instructions (Signed)
 [facility] - A DEPT OF [facility]  Discharge Instructions: Thank you for choosing [facility] to provide your oncology and hematology care.   If you have a lab appointment with the Cancer Center, please go directly to the Cancer Center and check in at the registration area.   Wear comfortable clothing and clothing appropriate for easy access to any Portacath or PICC line.   We strive to give you quality time with your provider. You may need to reschedule your appointment if you arrive late (15 or more minutes).  Arriving late affects you and other patients whose appointments are after yours.  Also, if you miss three or more appointments without notifying the office, you may be dismissed from the clinic at the provider's discretion.      For prescription refill requests, have your pharmacy contact our office and allow 72 hours for refills to be completed.    Today you received the following chemotherapy and/or immunotherapy agents: etoposide      To help prevent nausea and vomiting after your treatment, we encourage you to take your nausea medication as directed.  BELOW ARE SYMPTOMS THAT SHOULD BE REPORTED IMMEDIATELY: *FEVER GREATER THAN 100.4 F (38 C) OR HIGHER *CHILLS OR SWEATING *NAUSEA AND VOMITING THAT IS NOT CONTROLLED WITH YOUR NAUSEA MEDICATION *UNUSUAL SHORTNESS OF BREATH *UNUSUAL BRUISING OR BLEEDING *URINARY PROBLEMS (pain or burning when urinating, or frequent urination) *BOWEL PROBLEMS (unusual diarrhea, constipation, pain near the anus) TENDERNESS IN MOUTH AND THROAT WITH OR WITHOUT PRESENCE OF ULCERS (sore throat, sores in mouth, or a toothache) UNUSUAL RASH, SWELLING OR PAIN  UNUSUAL VAGINAL DISCHARGE OR ITCHING   Items with * indicate a potential emergency and should be followed up as soon as possible or go to the Emergency Department if any problems should occur.  Please show the CHEMOTHERAPY ALERT CARD or IMMUNOTHERAPY  ALERT CARD at check-in to the Emergency Department and triage nurse.  Should you have questions after your visit or need to cancel or reschedule your appointment, please contact [facility] - A DEPT OF [facility]  Dept: [phone]  and follow the prompts.  Office hours are 8:00 a.m. to 4:30 p.m. Monday - Friday. Please note that voicemails left after 4:00 p.m. may not be returned until the following business day.  We are closed weekends and major holidays. You have access to a nurse at all times for urgent questions. Please call the main number to the clinic Dept: [phone] and follow the prompts.   For any non-urgent questions, you may also contact your provider using MyChart. We now offer e-Visits for anyone 40 and older to request care online for non-urgent symptoms. For details visit [email or web address].   Also download the MyChart app! Go to the app store, search "MyChart", open the app, select [place], and log in with your MyChart username and password.

## 2024-03-28 ENCOUNTER — Inpatient Hospital Stay

## 2024-03-28 ENCOUNTER — Encounter: Payer: Self-pay | Admitting: Internal Medicine

## 2024-03-28 VITALS — BP 139/67 | HR 79 | Temp 98.2°F | Resp 18

## 2024-03-28 DIAGNOSIS — Z5111 Encounter for antineoplastic chemotherapy: Secondary | ICD-10-CM | POA: Diagnosis not present

## 2024-03-28 DIAGNOSIS — C3432 Malignant neoplasm of lower lobe, left bronchus or lung: Secondary | ICD-10-CM

## 2024-03-28 MED ORDER — SODIUM CHLORIDE 0.9 % IV SOLN
INTRAVENOUS | Status: DC
Start: 2024-03-28 — End: 2024-03-28

## 2024-03-28 MED ORDER — HEPARIN SOD (PORK) LOCK FLUSH 100 UNIT/ML IV SOLN: 500.0000 [IU] | Freq: Once | INTRAVENOUS | Status: DC | PRN

## 2024-03-28 MED ORDER — SODIUM CHLORIDE 0.9% FLUSH: 10.0000 mL | INTRAVENOUS | Status: DC | PRN

## 2024-03-28 MED ORDER — DEXAMETHASONE SODIUM PHOSPHATE 10 MG/ML IJ SOLN
10.0000 mg | Freq: Once | INTRAMUSCULAR | Status: AC
  Administered 2024-03-28 (×2): 10 mg via INTRAVENOUS
  Filled 2024-03-28: qty 1

## 2024-03-28 MED ORDER — SODIUM CHLORIDE 0.9 % IV SOLN
100.0000 mg/m2 | Freq: Once | INTRAVENOUS | Status: AC
  Administered 2024-03-28 (×2): 200 mg via INTRAVENOUS
  Filled 2024-03-28: qty 10

## 2024-03-28 NOTE — Patient Instructions (Signed)
 [facility] - A DEPT OF [facility]  Discharge Instructions: Thank you for choosing [facility] to provide your oncology and hematology care.   If you have a lab appointment with the Cancer Center, please go directly to the Cancer Center and check in at the registration area.   Wear comfortable clothing and clothing appropriate for easy access to any Portacath or PICC line.   We strive to give you quality time with your provider. You may need to reschedule your appointment if you arrive late (15 or more minutes).  Arriving late affects you and other patients whose appointments are after yours.  Also, if you miss three or more appointments without notifying the office, you may be dismissed from the clinic at the provider's discretion.      For prescription refill requests, have your pharmacy contact our office and allow 72 hours for refills to be completed.    Today you received the following chemotherapy and/or immunotherapy agents: Etoposide      To help prevent nausea and vomiting after your treatment, we encourage you to take your nausea medication as directed.  BELOW ARE SYMPTOMS THAT SHOULD BE REPORTED IMMEDIATELY: *FEVER GREATER THAN 100.4 F (38 C) OR HIGHER *CHILLS OR SWEATING *NAUSEA AND VOMITING THAT IS NOT CONTROLLED WITH YOUR NAUSEA MEDICATION *UNUSUAL SHORTNESS OF BREATH *UNUSUAL BRUISING OR BLEEDING *URINARY PROBLEMS (pain or burning when urinating, or frequent urination) *BOWEL PROBLEMS (unusual diarrhea, constipation, pain near the anus) TENDERNESS IN MOUTH AND THROAT WITH OR WITHOUT PRESENCE OF ULCERS (sore throat, sores in mouth, or a toothache) UNUSUAL RASH, SWELLING OR PAIN  UNUSUAL VAGINAL DISCHARGE OR ITCHING   Items with * indicate a potential emergency and should be followed up as soon as possible or go to the Emergency Department if any problems should occur.  Please show the CHEMOTHERAPY ALERT CARD or IMMUNOTHERAPY  ALERT CARD at check-in to the Emergency Department and triage nurse.  Should you have questions after your visit or need to cancel or reschedule your appointment, please contact [facility] - A DEPT OF [facility]  Dept: [phone]  and follow the prompts.  Office hours are 8:00 a.m. to 4:30 p.m. Monday - Friday. Please note that voicemails left after 4:00 p.m. may not be returned until the following business day.  We are closed weekends and major holidays. You have access to a nurse at all times for urgent questions. Please call the main number to the clinic Dept: [phone] and follow the prompts.   For any non-urgent questions, you may also contact your provider using MyChart. We now offer e-Visits for anyone 35 and older to request care online for non-urgent symptoms. For details visit [email or web address].   Also download the MyChart app! Go to the app store, search MyChart, open the app, select [facility], and log in with your MyChart username and password.

## 2024-03-30 ENCOUNTER — Telehealth: Payer: Self-pay | Admitting: Internal Medicine

## 2024-03-30 NOTE — Telephone Encounter (Signed)
 Scheduled appointments with the patient per staff message.

## 2024-04-04 ENCOUNTER — Inpatient Hospital Stay

## 2024-04-04 DIAGNOSIS — Z5111 Encounter for antineoplastic chemotherapy: Secondary | ICD-10-CM | POA: Diagnosis not present

## 2024-04-04 DIAGNOSIS — C3432 Malignant neoplasm of lower lobe, left bronchus or lung: Secondary | ICD-10-CM

## 2024-04-04 LAB — CBC WITH DIFFERENTIAL (CANCER CENTER ONLY)
Abs Immature Granulocytes: 0.07 K/uL (ref 0.00–0.07)
Basophils Absolute: 0 K/uL (ref 0.0–0.1)
Basophils Relative: 1 %
Eosinophils Absolute: 0 K/uL (ref 0.0–0.5)
Eosinophils Relative: 0 %
HCT: 29.6 % — ABNORMAL LOW (ref 39.0–52.0)
Hemoglobin: 10.6 g/dL — ABNORMAL LOW (ref 13.0–17.0)
Immature Granulocytes: 2 %
Lymphocytes Relative: 26 %
Lymphs Abs: 1 K/uL (ref 0.7–4.0)
MCH: 32.8 pg (ref 26.0–34.0)
MCHC: 35.8 g/dL (ref 30.0–36.0)
MCV: 91.6 fL (ref 80.0–100.0)
Monocytes Absolute: 0.1 K/uL (ref 0.1–1.0)
Monocytes Relative: 3 %
Neutro Abs: 2.7 K/uL (ref 1.7–7.7)
Neutrophils Relative %: 68 %
Platelet Count: 107 K/uL — ABNORMAL LOW (ref 150–400)
RBC: 3.23 MIL/uL — ABNORMAL LOW (ref 4.22–5.81)
RDW: 14.5 % (ref 11.5–15.5)
WBC Count: 3.9 K/uL — ABNORMAL LOW (ref 4.0–10.5)
nRBC: 0 % (ref 0.0–0.2)

## 2024-04-04 LAB — CMP (CANCER CENTER ONLY)
ALT: 23 U/L (ref 0–44)
AST: 19 U/L (ref 15–41)
Albumin: 4.8 g/dL (ref 3.5–5.0)
Alkaline Phosphatase: 56 U/L (ref 38–126)
Anion gap: 10 (ref 5–15)
BUN: 25 mg/dL — ABNORMAL HIGH (ref 8–23)
CO2: 27 mmol/L (ref 22–32)
Calcium: 10.5 mg/dL — ABNORMAL HIGH (ref 8.9–10.3)
Chloride: 101 mmol/L (ref 98–111)
Creatinine: 0.97 mg/dL (ref 0.61–1.24)
GFR, Estimated: >60 mL/min (ref >60)
Glucose, Bld: 170 mg/dL — ABNORMAL HIGH (ref 70–99)
Potassium: 3.7 mmol/L (ref 3.5–5.1)
Sodium: 138 mmol/L (ref 135–145)
Total Bilirubin: 0.6 mg/dL (ref 0.0–1.2)
Total Protein: 7.1 g/dL (ref 6.5–8.1)

## 2024-04-06 ENCOUNTER — Other Ambulatory Visit: Payer: Self-pay | Admitting: Physician Assistant

## 2024-04-06 DIAGNOSIS — C3432 Malignant neoplasm of lower lobe, left bronchus or lung: Secondary | ICD-10-CM

## 2024-04-06 DIAGNOSIS — Z5111 Encounter for antineoplastic chemotherapy: Secondary | ICD-10-CM

## 2024-04-11 ENCOUNTER — Ambulatory Visit (INDEPENDENT_AMBULATORY_CARE_PROVIDER_SITE_OTHER)

## 2024-04-11 DIAGNOSIS — I2510 Atherosclerotic heart disease of native coronary artery without angina pectoris: Secondary | ICD-10-CM | POA: Diagnosis not present

## 2024-04-11 DIAGNOSIS — E781 Pure hyperglyceridemia: Secondary | ICD-10-CM

## 2024-04-11 DIAGNOSIS — E785 Hyperlipidemia, unspecified: Secondary | ICD-10-CM

## 2024-04-11 DIAGNOSIS — R42 Dizziness and giddiness: Secondary | ICD-10-CM

## 2024-04-12 ENCOUNTER — Inpatient Hospital Stay

## 2024-04-12 ENCOUNTER — Telehealth: Payer: Self-pay | Admitting: Medical Oncology

## 2024-04-12 ENCOUNTER — Ambulatory Visit: Payer: Self-pay | Admitting: Nurse Practitioner

## 2024-04-12 ENCOUNTER — Ambulatory Visit (HOSPITAL_COMMUNITY)
Admission: RE | Admit: 2024-04-12 | Discharge: 2024-04-12 | Disposition: A | Discharge status: Home / Self Care | Source: Ambulatory Visit | Attending: Internal Medicine | Admitting: Internal Medicine

## 2024-04-12 ENCOUNTER — Other Ambulatory Visit: Payer: Self-pay | Admitting: Physician Assistant

## 2024-04-12 VITALS — BP 153/68 | HR 91 | Temp 98.0°F | Resp 16

## 2024-04-12 DIAGNOSIS — C3490 Malignant neoplasm of unspecified part of unspecified bronchus or lung: Secondary | ICD-10-CM | POA: Insufficient documentation

## 2024-04-12 DIAGNOSIS — C3432 Malignant neoplasm of lower lobe, left bronchus or lung: Secondary | ICD-10-CM

## 2024-04-12 DIAGNOSIS — Z5111 Encounter for antineoplastic chemotherapy: Secondary | ICD-10-CM | POA: Diagnosis not present

## 2024-04-12 DIAGNOSIS — D701 Agranulocytosis secondary to cancer chemotherapy: Secondary | ICD-10-CM

## 2024-04-12 DIAGNOSIS — I700 Atherosclerosis of aorta: Secondary | ICD-10-CM | POA: Diagnosis not present

## 2024-04-12 LAB — CMP (CANCER CENTER ONLY)
ALT: 19 U/L (ref 0–44)
AST: 17 U/L (ref 15–41)
Albumin: 4.5 g/dL (ref 3.5–5.0)
Alkaline Phosphatase: 54 U/L (ref 38–126)
Anion gap: 11 (ref 5–15)
BUN: 18 mg/dL (ref 8–23)
CO2: 23 mmol/L (ref 22–32)
Calcium: 9.1 mg/dL (ref 8.9–10.3)
Chloride: 105 mmol/L (ref 98–111)
Creatinine: 1.07 mg/dL (ref 0.61–1.24)
GFR, Estimated: >60 mL/min (ref >60)
Glucose, Bld: 249 mg/dL — ABNORMAL HIGH (ref 70–99)
Potassium: 3.7 mmol/L (ref 3.5–5.1)
Sodium: 139 mmol/L (ref 135–145)
Total Bilirubin: 0.5 mg/dL (ref 0.0–1.2)
Total Protein: 6.7 g/dL (ref 6.5–8.1)

## 2024-04-12 LAB — CBC WITH DIFFERENTIAL (CANCER CENTER ONLY)
Abs Immature Granulocytes: 0.1 K/uL — ABNORMAL HIGH (ref 0.00–0.07)
Basophils Absolute: 0 K/uL (ref 0.0–0.1)
Basophils Relative: 1 %
Eosinophils Absolute: 0.1 K/uL (ref 0.0–0.5)
Eosinophils Relative: 3 %
HCT: 27.9 % — ABNORMAL LOW (ref 39.0–52.0)
Hemoglobin: 9.9 g/dL — ABNORMAL LOW (ref 13.0–17.0)
Immature Granulocytes: 5 %
Lymphocytes Relative: 53 %
Lymphs Abs: 1 K/uL (ref 0.7–4.0)
MCH: 33.2 pg (ref 26.0–34.0)
MCHC: 35.5 g/dL (ref 30.0–36.0)
MCV: 93.6 fL (ref 80.0–100.0)
Monocytes Absolute: 0.3 K/uL (ref 0.1–1.0)
Monocytes Relative: 15 %
Neutro Abs: 0.4 K/uL — CL (ref 1.7–7.7)
Neutrophils Relative %: 23 %
Platelet Count: 151 K/uL (ref 150–400)
RBC: 2.98 MIL/uL — ABNORMAL LOW (ref 4.22–5.81)
RDW: 16.3 % — ABNORMAL HIGH (ref 11.5–15.5)
Smear Review: NORMAL
WBC Count: 1.9 K/uL — ABNORMAL LOW (ref 4.0–10.5)
nRBC: 1.1 % — ABNORMAL HIGH (ref 0.0–0.2)

## 2024-04-12 MED ORDER — FILGRASTIM-SNDZ 480 MCG/0.8ML IJ SOSY
480.0000 ug | PREFILLED_SYRINGE | Freq: Once | INTRAMUSCULAR | Status: AC
  Administered 2024-04-12: 480 ug via SUBCUTANEOUS
  Filled 2024-04-12: qty 0.8

## 2024-04-12 MED ORDER — IOHEXOL 300 MG/ML  SOLN
75.0000 mL | Freq: Once | INTRAMUSCULAR | Status: AC | PRN
  Administered 2024-04-12: 75 mL via INTRAVENOUS

## 2024-04-12 NOTE — Telephone Encounter (Signed)
 CRITICAL VALUE STICKER  CRITICAL VALUE: ANC=0.4  RECEIVER (on-site recipient of call): Forestine Macho, RN  DATE & TIME NOTIFIED: 04/12/2024 @ 1036  MESSENGER (representative from lab):LAB  MD NOTIFIED: Sherrod  TIME OF NOTIFICATION: 1045  RESPONSE:  Pt contacted re: Neutrapenic precautions.

## 2024-04-13 ENCOUNTER — Ambulatory Visit

## 2024-04-13 DIAGNOSIS — Z5111 Encounter for antineoplastic chemotherapy: Secondary | ICD-10-CM | POA: Diagnosis not present

## 2024-04-13 DIAGNOSIS — T451X5A Adverse effect of antineoplastic and immunosuppressive drugs, initial encounter: Secondary | ICD-10-CM

## 2024-04-13 MED ORDER — FILGRASTIM-SNDZ 480 MCG/0.8ML IJ SOSY
480.0000 ug | PREFILLED_SYRINGE | Freq: Once | INTRAMUSCULAR | Status: AC
  Administered 2024-04-13: 480 ug via SUBCUTANEOUS
  Filled 2024-04-13: qty 0.8

## 2024-04-14 ENCOUNTER — Ambulatory Visit

## 2024-04-14 ENCOUNTER — Inpatient Hospital Stay

## 2024-04-14 VITALS — BP 148/59 | HR 95 | Temp 97.5°F | Resp 17

## 2024-04-14 DIAGNOSIS — Z5111 Encounter for antineoplastic chemotherapy: Secondary | ICD-10-CM | POA: Diagnosis not present

## 2024-04-14 DIAGNOSIS — T451X5A Adverse effect of antineoplastic and immunosuppressive drugs, initial encounter: Secondary | ICD-10-CM

## 2024-04-14 MED ORDER — FILGRASTIM-SNDZ 480 MCG/0.8ML IJ SOSY
480.0000 ug | PREFILLED_SYRINGE | Freq: Once | INTRAMUSCULAR | Status: AC
  Administered 2024-04-14: 480 ug via SUBCUTANEOUS
  Filled 2024-04-14: qty 0.8

## 2024-04-17 ENCOUNTER — Telehealth: Payer: Self-pay | Admitting: Medical Oncology

## 2024-04-17 ENCOUNTER — Other Ambulatory Visit

## 2024-04-17 NOTE — Telephone Encounter (Signed)
 Patient reports a raised area noted below the crease of his right  elbow, he first observed today. Patient describes the area as red and approximately the size of a quarter. He denies any warmth at the site but reports significant tenderness to touch.   This site was previously used for chemotherapy administration during a treatment several sessions ago.   Labs tomorrow @ [phone] and dermatology appt at 1140.

## 2024-04-18 ENCOUNTER — Telehealth: Payer: Self-pay | Admitting: Medical Oncology

## 2024-04-18 ENCOUNTER — Inpatient Hospital Stay: Attending: Internal Medicine

## 2024-04-18 DIAGNOSIS — L821 Other seborrheic keratosis: Secondary | ICD-10-CM | POA: Diagnosis not present

## 2024-04-18 DIAGNOSIS — C3411 Malignant neoplasm of upper lobe, right bronchus or lung: Secondary | ICD-10-CM | POA: Diagnosis not present

## 2024-04-18 DIAGNOSIS — M79601 Pain in right arm: Secondary | ICD-10-CM | POA: Diagnosis not present

## 2024-04-18 DIAGNOSIS — Z902 Acquired absence of lung [part of]: Secondary | ICD-10-CM | POA: Diagnosis not present

## 2024-04-18 DIAGNOSIS — T451X5A Adverse effect of antineoplastic and immunosuppressive drugs, initial encounter: Secondary | ICD-10-CM | POA: Insufficient documentation

## 2024-04-18 DIAGNOSIS — L570 Actinic keratosis: Secondary | ICD-10-CM | POA: Diagnosis not present

## 2024-04-18 DIAGNOSIS — J3489 Other specified disorders of nose and nasal sinuses: Secondary | ICD-10-CM | POA: Insufficient documentation

## 2024-04-18 DIAGNOSIS — C3490 Malignant neoplasm of unspecified part of unspecified bronchus or lung: Secondary | ICD-10-CM

## 2024-04-18 DIAGNOSIS — C3432 Malignant neoplasm of lower lobe, left bronchus or lung: Secondary | ICD-10-CM | POA: Insufficient documentation

## 2024-04-18 DIAGNOSIS — L905 Scar conditions and fibrosis of skin: Secondary | ICD-10-CM | POA: Diagnosis not present

## 2024-04-18 DIAGNOSIS — M7989 Other specified soft tissue disorders: Secondary | ICD-10-CM | POA: Insufficient documentation

## 2024-04-18 DIAGNOSIS — L820 Inflamed seborrheic keratosis: Secondary | ICD-10-CM | POA: Diagnosis not present

## 2024-04-18 DIAGNOSIS — Z9221 Personal history of antineoplastic chemotherapy: Secondary | ICD-10-CM | POA: Diagnosis not present

## 2024-04-18 DIAGNOSIS — G620 Drug-induced polyneuropathy: Secondary | ICD-10-CM | POA: Insufficient documentation

## 2024-04-18 DIAGNOSIS — Z800 Family history of malignant neoplasm of digestive organs: Secondary | ICD-10-CM | POA: Diagnosis not present

## 2024-04-18 DIAGNOSIS — D6481 Anemia due to antineoplastic chemotherapy: Secondary | ICD-10-CM | POA: Diagnosis not present

## 2024-04-18 DIAGNOSIS — F17210 Nicotine dependence, cigarettes, uncomplicated: Secondary | ICD-10-CM | POA: Diagnosis not present

## 2024-04-18 DIAGNOSIS — Z85828 Personal history of other malignant neoplasm of skin: Secondary | ICD-10-CM | POA: Diagnosis not present

## 2024-04-18 DIAGNOSIS — Z85820 Personal history of malignant melanoma of skin: Secondary | ICD-10-CM | POA: Diagnosis not present

## 2024-04-18 DIAGNOSIS — D225 Melanocytic nevi of trunk: Secondary | ICD-10-CM | POA: Diagnosis not present

## 2024-04-18 LAB — CMP (CANCER CENTER ONLY)
ALT: 19 U/L (ref 0–44)
AST: 18 U/L (ref 15–41)
Albumin: 4.5 g/dL (ref 3.5–5.0)
Alkaline Phosphatase: 90 U/L (ref 38–126)
Anion gap: 11 (ref 5–15)
BUN: 21 mg/dL (ref 8–23)
CO2: 23 mmol/L (ref 22–32)
Calcium: 9.3 mg/dL (ref 8.9–10.3)
Chloride: 106 mmol/L (ref 98–111)
Creatinine: 1.12 mg/dL (ref 0.61–1.24)
GFR, Estimated: >60 mL/min (ref >60)
Glucose, Bld: 168 mg/dL — ABNORMAL HIGH (ref 70–99)
Potassium: 3.7 mmol/L (ref 3.5–5.1)
Sodium: 140 mmol/L (ref 135–145)
Total Bilirubin: 0.5 mg/dL (ref 0.0–1.2)
Total Protein: 7.4 g/dL (ref 6.5–8.1)

## 2024-04-18 LAB — CBC WITH DIFFERENTIAL (CANCER CENTER ONLY)
Abs Immature Granulocytes: 1.59 K/uL — ABNORMAL HIGH (ref 0.00–0.07)
Basophils Absolute: 0.1 K/uL (ref 0.0–0.1)
Basophils Relative: 1 %
Eosinophils Absolute: 0.1 K/uL (ref 0.0–0.5)
Eosinophils Relative: 0 %
HCT: 29.6 % — ABNORMAL LOW (ref 39.0–52.0)
Hemoglobin: 10.1 g/dL — ABNORMAL LOW (ref 13.0–17.0)
Immature Granulocytes: 10 %
Lymphocytes Relative: 12 %
Lymphs Abs: 1.8 K/uL (ref 0.7–4.0)
MCH: 33.1 pg (ref 26.0–34.0)
MCHC: 34.1 g/dL (ref 30.0–36.0)
MCV: 97 fL (ref 80.0–100.0)
Monocytes Absolute: 1.8 K/uL — ABNORMAL HIGH (ref 0.1–1.0)
Monocytes Relative: 12 %
Neutro Abs: 10 K/uL — ABNORMAL HIGH (ref 1.7–7.7)
Neutrophils Relative %: 65 %
Platelet Count: 200 K/uL (ref 150–400)
RBC: 3.05 MIL/uL — ABNORMAL LOW (ref 4.22–5.81)
RDW: 17.2 % — ABNORMAL HIGH (ref 11.5–15.5)
WBC Count: 15.5 K/uL — ABNORMAL HIGH (ref 4.0–10.5)
nRBC: 0.4 % — ABNORMAL HIGH (ref 0.0–0.2)

## 2024-04-18 NOTE — Telephone Encounter (Signed)
 Pt saw dermatologist  today who said the area on his forearm is not a skin issue . It looks like a clot .

## 2024-04-18 NOTE — Telephone Encounter (Signed)
 Pt given appt for

## 2024-04-19 ENCOUNTER — Ambulatory Visit: Admitting: Physician Assistant

## 2024-04-19 VITALS — BP 153/71 | HR 90 | Temp 98.0°F | Resp 17 | Ht 68.0 in | Wt 181.8 lb

## 2024-04-19 DIAGNOSIS — T451X5A Adverse effect of antineoplastic and immunosuppressive drugs, initial encounter: Secondary | ICD-10-CM

## 2024-04-19 DIAGNOSIS — G620 Drug-induced polyneuropathy: Secondary | ICD-10-CM | POA: Diagnosis not present

## 2024-04-19 DIAGNOSIS — C3432 Malignant neoplasm of lower lobe, left bronchus or lung: Secondary | ICD-10-CM

## 2024-04-19 DIAGNOSIS — M79601 Pain in right arm: Secondary | ICD-10-CM | POA: Diagnosis not present

## 2024-04-19 NOTE — Progress Notes (Signed)
 Symptom Management Consult Note [facility]    Patient Care Team: [name], MD as PCP - General (Family Medicine) Tobie Tonita POUR, DO as Consulting Physician (Neurology)    Name / MRN / DOB: Gregory Jensen  [id number]  1956-03-28   Date of visit: 04/19/2024   Chief Complaint/Reason for visit: arm pain   Current Therapy: Etoposide   Last treatment:  Day 3   Cycle 4 on 03/28/24    ASSESSMENT AND PLAN Patient is a 68 y.o. male with oncologic history of Stage IA (T1c, N0, M0) non-small cell lung cancer, squamous cell carcinoma followed by Dr. Sherrod.  I have viewed most recent oncology note and lab work.  #Stage IA (T1c, N0, M0) non-small cell lung cancer, squamous cell carcinoma  - Next appointment with oncologist is 04/25/24   #Suspected superficial vein thrombosis of right arm Redness and tenderness near previous IV site, improving. Suspected superficial vein thrombosis due to IV trauma. Patient prefers to hold off on US  imaging and monitor symptoms for resolution. Exam is not consistent with cellulitis or an abscess. - Discussed symptom management including applying warm compress 2-3 times daily.  Take Tylenol  as needed for pain. - Area outlined with skin marker. Patient knows to seek evaluation if redness extends or symptoms worsen.  #Chemotherapy-induced peripheral neuropathy Worsening neuropathy in legs and hands due to chemotherapy. Current gabapentin  200 mg at night aids sleep and leg discomfort. No significant pain, mainly numbness and weakness. Discussed increasing gabapentin  for better symptom management. - Increase gabapentin  by 100 mg in the morning, in addition to 200 mg at bedtime. Patient will out before next refill is due because of increase. - Monitor response to increased dosage and discuss further adjustments with Dr. Gatha at next follow-up if needed.   Strict ED precautions discussed should symptoms worsen.   HEME/ONC  HISTORY Oncology History  Malignant neoplasm of upper lobe of right lung (HCC)  07/20/2022 Initial Diagnosis   Lung cancer (HCC)   07/27/2022 Cancer Staging   Staging form: Lung, AJCC 8th Edition - Clinical: Stage IA3 (cT1c, cN0, cM0) - Signed by Sherrod Sherrod, MD on 07/27/2022   Primary small cell carcinoma of lower lobe of left lung (HCC)  01/10/2024 Initial Diagnosis   Primary small cell carcinoma of lower lobe of left lung (HCC)   01/10/2024 Cancer Staging   Staging form: Lung, AJCC V9 - Clinical: Stage IA3 (cT1c, cN0, cM0) - Signed by Sherrod Sherrod, MD on 01/10/2024 Method of lymph node assessment: Clinical   01/24/2024 -  Chemotherapy   Patient is on Treatment Plan : LUNG SMALL CELL Carboplatin  (AUC 5) D1 + Etoposide  (100) D1-3 q21d         INTERVAL HISTORY  Discussed the use of AI scribe software for clinical note transcription with the patient, who gave verbal consent to proceed.    Gregory Jensen is a 68 y.o. male with oncologic history as above presenting to [facility] today with chief complaint of right arm pain. Patient presents unaccompanied to visit today.   He has experienced redness, soreness, and swelling in his arm since Tuesday morning. Initially, the redness was the size of a nickel and increased in size by the next day, although the swelling did not worsen. By Thursday morning, the redness had significantly reduced, but tenderness and a bulging sensation persisted. He rates the tenderness as a 6 or 7 out of 10, which is only present upon touch. He is right-handed and  has no difficulty using his arm. The symptoms are associated with a previous chemotherapy treatment where an IV was placed in the affected vein. He has undergone twelve chemotherapy treatments in total, with the last one being three weeks ago. He has no known history of blood clots and is not on blood thinners, except for a daily 81 mg aspirin .  No shortness of breath or chest pain.  He has been  experiencing worsening neuropathy, particularly in his legs and feet. He describes numbness, weakness with some numbness in his hands. He is currently taking 200 mg of gabapentin  at night, which he believes helps with his leg symptoms and sleep.     ROS  All other systems are reviewed and are negative for acute change except as noted in the HPI.    Allergies  Allergen Reactions   Jardiance  [Empagliflozin ] Other (See Comments)    Candidal balanitis     Past Medical History:  Diagnosis Date   Cancer (HCC) 4 years ago   Prostate Cancer , Lung Cancer   Diabetes mellitus without complication (HCC)    type 2   Hyperlipidemia    Hypertension    Lung cancer (HCC)    Left Lower Lobe Lung   Neuromuscular disorder (HCC)    neuropathy   Neuropathy    in legs causing balance issues per patient   Sleep apnea    Smoker      Past Surgical History:  Procedure Laterality Date   BACK SURGERY  15 years ago   BRONCHIAL BIOPSY  05/10/2022   Procedure: BRONCHIAL BIOPSIES;  Surgeon: Shelah Lamar RAMAN, MD;  Location: [facility] ENDOSCOPY;  Service: Pulmonary;;   BRONCHIAL BIOPSY  12/26/2023   Procedure: BRONCHOSCOPY, WITH BIOPSY;  Surgeon: Shelah Lamar RAMAN, MD;  Location: [facility] ENDOSCOPY;  Service: Pulmonary;;   BRONCHIAL BRUSHINGS  05/10/2022   Procedure: BRONCHIAL BRUSHINGS;  Surgeon: Shelah Lamar RAMAN, MD;  Location: [facility] ENDOSCOPY;  Service: Pulmonary;;   BRONCHIAL BRUSHINGS  12/26/2023   Procedure: BRONCHOSCOPY, WITH BRUSH BIOPSY;  Surgeon: Shelah Lamar RAMAN, MD;  Location: [facility] ENDOSCOPY;  Service: Pulmonary;;   BRONCHIAL NEEDLE ASPIRATION BIOPSY  05/10/2022   Procedure: BRONCHIAL NEEDLE ASPIRATION BIOPSIES;  Surgeon: Shelah Lamar RAMAN, MD;  Location: [facility] ENDOSCOPY;  Service: Pulmonary;;   BRONCHIAL NEEDLE ASPIRATION BIOPSY  12/26/2023   Procedure: BRONCHOSCOPY, WITH NEEDLE ASPIRATION BIOPSY;  Surgeon: Shelah Lamar RAMAN, MD;  Location: [facility] ENDOSCOPY;  Service: Pulmonary;;   COLONOSCOPY  2007   pt does not know MD  name/normal exam per pt.   FIDUCIAL MARKER PLACEMENT  05/10/2022   Procedure: FIDUCIAL DYE MARKING;  Surgeon: Shelah Lamar RAMAN, MD;  Location: [facility] ENDOSCOPY;  Service: Pulmonary;;   FIDUCIAL MARKER PLACEMENT  12/26/2023   Procedure: INSERTION, FIDUCIAL MARKERS;  Surgeon: Shelah Lamar RAMAN, MD;  Location: [facility] ENDOSCOPY;  Service: Pulmonary;;   INTERCOSTAL NERVE BLOCK Right 05/10/2022   Procedure: INTERCOSTAL NERVE BLOCK;  Surgeon: Shyrl Linnie KIDD, MD;  Location: [facility] OR;  Service: Thoracic;  Laterality: Right;   NODE DISSECTION Right 05/10/2022   Procedure: NODE DISSECTION;  Surgeon: Shyrl Linnie KIDD, MD;  Location: [facility] OR;  Service: Thoracic;  Laterality: Right;   PROSTATECTOMY  4 years ago   VIDEO BRONCHOSCOPY WITH ENDOBRONCHIAL NAVIGATION Left 12/26/2023   Procedure: VIDEO BRONCHOSCOPY WITH ENDOBRONCHIAL NAVIGATION;  Surgeon: Shelah Lamar RAMAN, MD;  Location: [facility] ENDOSCOPY;  Service: Pulmonary;  Laterality: Left;    Social History   Socioeconomic History   Marital status: Significant Other  Spouse name: Avelina   Number of children: 1   Years of education: 12   Highest education level: Not on file  Occupational History   Occupation: [employer]    Comment: Retired  Tobacco Use   Smoking status: Every Day    Current packs/day: 1.00    Average packs/day: 1 pack/day for 47.0 years (47.0 ttl pk-yrs)    Types: Cigarettes   Smokeless tobacco: Never   Tobacco comments:    1 pack of cigarettes smoked daily 12/23/2023  Vaping Use   Vaping status: Never Used  Substance and Sexual Activity   Alcohol use: Not Currently   Drug use: Never   Sexual activity: Not on file  Other Topics Concern   Not on file  Social History Narrative   Lives with wife. Has one son who lives in [place].    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        Are you right handed or left handed? Right Handed   Are you currently employed ? No- Retired   What is your current  occupation? No   Do you live at home alone? No   Who lives with you? Avelina- Significant other   What type of home do you live in: 1 story or 2 story? One story home       Social Drivers of Health   Financial Resource Strain: Low Risk  (08/09/2023)   Overall Financial Resource Strain (CARDIA)    Difficulty of Paying Living Expenses: Not hard at all  Food Insecurity: No Food Insecurity (08/09/2023)   Hunger Vital Sign    Worried About Running Out of Food in the Last Year: Never true    Ran Out of Food in the Last Year: Never true  Transportation Needs: No Transportation Needs (08/09/2023)   PRAPARE - Administrator, Civil Service (Medical): No    Lack of Transportation (Non-Medical): No  Physical Activity: Insufficiently Active (08/09/2023)   Exercise Vital Sign    Days of Exercise per Week: 5 days    Minutes of Exercise per Session: 10 min  Stress: No Stress Concern Present (08/09/2023)   [employer] of Occupational Health - Occupational Stress Questionnaire    Feeling of Stress : Not at all  Social Connections: Moderately Isolated (08/09/2023)   Social Connection and Isolation Panel    Frequency of Communication with Friends and Family: More than three times a week    Frequency of Social Gatherings with Friends and Family: More than three times a week    Attends Religious Services: Never    Database administrator or Organizations: No    Attends Banker Meetings: Never    Marital Status: Married  Catering manager Violence: Not At Risk (08/09/2023)   Humiliation, Afraid, Rape, and Kick questionnaire    Fear of Current or Ex-Partner: No    Emotionally Abused: No    Physically Abused: No    Sexually Abused: No    Family History  Problem Relation Age of Onset   Alzheimer's disease Mother    Heart disease Father    Hypertension Sister    Heart disease Sister    Hypertension Sister    Stomach cancer Brother    Diabetes Other        family  hx   Colon cancer Neg Hx    Colon polyps Neg Hx    Esophageal cancer Neg Hx  Rectal cancer Neg Hx      Current Outpatient Medications:    aspirin  81 MG EC tablet, Take 81 mg by mouth in the morning., Disp: , Rfl:    atorvastatin  (LIPITOR) 40 MG tablet, TAKE 1 TABLET BY MOUTH EVERY DAY, Disp: 90 tablet, Rfl: 0   Blood Glucose Monitoring Suppl (ONE TOUCH ULTRA 2) w/Device KIT, Used TID to check glucose or PRN Code E11.9, Disp: 1 kit, Rfl: 0   cyanocobalamin  1000 MCG tablet, Take 1,000 mcg by mouth daily., Disp: , Rfl:    fenofibrate  (TRICOR ) 145 MG tablet, TAKE 1 TABLET BY MOUTH EVERY DAY, Disp: 90 tablet, Rfl: 0   gabapentin  (NEURONTIN ) 100 MG capsule, TAKE 1-2 CAPSULES (100-200 MG TOTAL) BY MOUTH AT BEDTIME., Disp: 60 capsule, Rfl: 1   glucose blood (ONETOUCH VERIO) test strip, 1 each by Other route daily as needed for other. Use as instructed, Disp: 100 each, Rfl: 4   ibuprofen (ADVIL) 200 MG tablet, Take 400-600 mg by mouth daily as needed (pain.)., Disp: , Rfl:    losartan  (COZAAR ) 50 MG tablet, TAKE 1 TABLET BY MOUTH EVERY DAY, Disp: 90 tablet, Rfl: 0   metFORMIN  (GLUCOPHAGE ) 500 MG tablet, TAKE 1 TABLET BY MOUTH TWICE A DAY WITH FOOD, Disp: 180 tablet, Rfl: 1   ondansetron  (ZOFRAN ) 8 MG tablet, Take 1 tablet (8 mg total) by mouth every 8 (eight) hours as needed for nausea or vomiting. Start on third day after chemotherapy., Disp: 30 tablet, Rfl: 1   OneTouch Delica Lancets 33G MISC, 1 each by Does not apply route daily as needed., Disp: 100 each, Rfl: 4   oxyCODONE -acetaminophen  (PERCOCET/ROXICET) 5-325 MG tablet, Take 1 tablet by mouth every 8 (eight) hours as needed for severe pain (pain score 7-10)., Disp: 30 tablet, Rfl: 0   prochlorperazine  (COMPAZINE ) 10 MG tablet, Take 1 tablet (10 mg total) by mouth every 6 (six) hours as needed for nausea or vomiting (Nausea or vomiting)., Disp: 30 tablet, Rfl: 1  PHYSICAL EXAM ECOG FS:1 - Symptomatic but completely ambulatory    Vitals:    04/19/24 1126  BP: (!) 153/71  Pulse: 90  Resp: 17  Temp: 98 F (36.7 C)  TempSrc: Temporal  SpO2: 97%  Weight: 181 lb 12.8 oz (82.5 kg)  Height: 5' 8 (1.727 m)   Physical Exam Vitals and nursing note reviewed.  Constitutional:      Appearance: He is not ill-appearing or toxic-appearing.  HENT:     Head: Normocephalic.  Eyes:     Conjunctiva/sclera: Conjunctivae normal.  Cardiovascular:     Rate and Rhythm: Normal rate and regular rhythm.     Pulses: Normal pulses.          Radial pulses are 2+ on the right side.  Pulmonary:     Effort: Pulmonary effort is normal.  Abdominal:     General: There is no distension.  Musculoskeletal:     Cervical back: Normal range of motion.     Comments: Full ROM of RUE.  Skin:    General: Skin is warm and dry.     Comments: Equal tactile temperature in BUE.  2.5 x 3.5 cm area of erythema on right forearm. Tender to palpation. No palpable fluctuance. No gross abscess.  Neurological:     Mental Status: He is alert.     Comments: Sensation intact to all extremities        LABORATORY DATA I have reviewed the data as listed    Latest Ref  Rng & Units 04/18/2024    9:57 AM 04/12/2024    9:51 AM 04/04/2024   10:53 AM  CBC  WBC 4.0 - 10.5 K/uL 15.5  1.9  3.9   Hemoglobin 13.0 - 17.0 g/dL 89.8  9.9  89.3   Hematocrit 39.0 - 52.0 % 29.6  27.9  29.6   Platelets 150 - 400 K/uL 200  151  107         Latest Ref Rng & Units 04/18/2024    9:57 AM 04/12/2024    9:51 AM 04/04/2024   10:53 AM  CMP  Glucose 70 - 99 mg/dL 831  750  829   BUN 8 - 23 mg/dL 21  18  25    Creatinine 0.61 - 1.24 mg/dL 8.87  8.92  9.02   Sodium 135 - 145 mmol/L 140  139  138   Potassium 3.5 - 5.1 mmol/L 3.7  3.7  3.7   Chloride 98 - 111 mmol/L 106  105  101   CO2 22 - 32 mmol/L 23  23  27    Calcium  8.9 - 10.3 mg/dL 9.3  9.1  89.4   Total Protein 6.5 - 8.1 g/dL 7.4  6.7  7.1   Total Bilirubin 0.0 - 1.2 mg/dL 0.5  0.5  0.6   Alkaline Phos 38 - 126 U/L 90  54  56    AST 15 - 41 U/L 18  17  19    ALT 0 - 44 U/L 19  19  23         RADIOGRAPHIC STUDIES (from last 24 hours if applicable) I have personally reviewed the radiological images as listed and agreed with the findings in the report. No results found.      Visit Diagnosis: 1. Primary small cell carcinoma of lower lobe of left lung (HCC)   2. Neuropathy due to chemotherapeutic drug (HCC)   3. Right arm pain      No orders of the defined types were placed in this encounter.   All questions were answered. The patient knows to call the clinic with any problems, questions or concerns. No barriers to learning was detected.  A total of more than 30 minutes were spent on this encounter with face-to-face time and non-face-to-face time, including preparing to see the patient, ordering tests and/or medications, counseling the patient and coordination of care as outlined above.    Thank you for allowing me to participate in the care of this patient.    Giavanna Kang E  Walisiewicz, PA-C Department of Hematology/Oncology [facility] Phone: [phone]  [phone]    04/19/2024 12:30 PM

## 2024-04-25 ENCOUNTER — Inpatient Hospital Stay (HOSPITAL_BASED_OUTPATIENT_CLINIC_OR_DEPARTMENT_OTHER): Admitting: Internal Medicine

## 2024-04-25 VITALS — BP 132/68 | HR 100 | Temp 96.7°F | Resp 17 | Wt 178.7 lb

## 2024-04-25 DIAGNOSIS — C3432 Malignant neoplasm of lower lobe, left bronchus or lung: Secondary | ICD-10-CM | POA: Diagnosis not present

## 2024-04-25 NOTE — Progress Notes (Signed)
 [facility] [phone]   [phone]) [phone]  OFFICE PROGRESS NOTE  [name], MD [address] [place] [address]  DIAGNOSIS:   1) stage IA (T1c, N0, M0) small cell lung cancer presented with left lower lobe lung nodule diagnosed in May 2025.  2) Stage IA (T1c, N0, M0) non-small cell lung cancer, squamous cell carcinoma presented with right upper lobe in July 2023   PRIOR THERAPY:  1) Status post right upper lobectomy with lymph node sampling under the care of Dr. Shyrl on 05/10/2022 with tumor size of 2.3 cm.  2) Systemic chemotherapy with carboplatin  for AUC of 5 on day 1 and oh etoposide  100 Mg/M2 on days 1, 2 and 3 every 3 weeks.  First dose 01/23/2024.  This is concurrent with SBRT to the left lower lobe lung nodule under the care of Dr. Dewey.  Status post 4 cycles.  CURRENT THERAPY: None INTERVAL HISTORY: Gregory Jensen 68 y.o. male returns to the clinic today for follow-up visit. Discussed the use of AI scribe software for clinical note transcription with the patient, who gave verbal consent to proceed.  History of Present Illness Gregory Jensen is a 68 year old male with stage 1A small cell lung cancer who presents for evaluation with repeat CT scan of the chest for restaging after chemotherapy and radiation.  He has a history of stage 1A small cell lung cancer with a left lower lobe lung nodule diagnosed in May 2025. He completed four cycles of systemic chemotherapy with carboplatin  and etoposide  every three weeks, concurrent with stereotactic body radiation therapy to the left lower lobe lung nodule.  He also has a history of stage 1A non-small cell lung cancer, squamous cell carcinoma, involving the right upper lobe, diagnosed in July 2023. He underwent a right upper lobectomy for this condition.  His neuropathy has worsened since starting chemotherapy, describing it as 'as bad as it's going to get'. He had neuropathy prior  to chemotherapy, but it has become significantly worse.  He notes frequent rhinorrhea, though he is unsure of the cause.    MEDICAL HISTORY: Past Medical History:  Diagnosis Date   Cancer (HCC) 4 years ago   Prostate Cancer , Lung Cancer   Diabetes mellitus without complication (HCC)    type 2   Hyperlipidemia    Hypertension    Lung cancer (HCC)    Left Lower Lobe Lung   Neuromuscular disorder (HCC)    neuropathy   Neuropathy    in legs causing balance issues per patient   Sleep apnea    Smoker     ALLERGIES:  is allergic to jardiance  [empagliflozin ].  MEDICATIONS:  Current Outpatient Medications  Medication Sig Dispense Refill   aspirin  81 MG EC tablet Take 81 mg by mouth in the morning.     atorvastatin  (LIPITOR) 40 MG tablet TAKE 1 TABLET BY MOUTH EVERY DAY 90 tablet 0   Blood Glucose Monitoring Suppl (ONE TOUCH ULTRA 2) w/Device KIT Used TID to check glucose or PRN Code E11.9 1 kit 0   cyanocobalamin  1000 MCG tablet Take 1,000 mcg by mouth daily.     fenofibrate  (TRICOR ) 145 MG tablet TAKE 1 TABLET BY MOUTH EVERY DAY 90 tablet 0   gabapentin  (NEURONTIN ) 100 MG capsule TAKE 1-2 CAPSULES (100-200 MG TOTAL) BY MOUTH AT BEDTIME. 60 capsule 1   glucose blood (ONETOUCH VERIO) test strip 1 each by Other route daily as needed for  other. Use as instructed 100 each 4   ibuprofen (ADVIL) 200 MG tablet Take 400-600 mg by mouth daily as needed (pain.).     losartan  (COZAAR ) 50 MG tablet TAKE 1 TABLET BY MOUTH EVERY DAY 90 tablet 0   metFORMIN  (GLUCOPHAGE ) 500 MG tablet TAKE 1 TABLET BY MOUTH TWICE A DAY WITH FOOD 180 tablet 1   ondansetron  (ZOFRAN ) 8 MG tablet Take 1 tablet (8 mg total) by mouth every 8 (eight) hours as needed for nausea or vomiting. Start on third day after chemotherapy. 30 tablet 1   OneTouch Delica Lancets 33G MISC 1 each by Does not apply route daily as needed. 100 each 4   oxyCODONE -acetaminophen  (PERCOCET/ROXICET) 5-325 MG tablet Take 1 tablet by mouth every 8  (eight) hours as needed for severe pain (pain score 7-10). 30 tablet 0   prochlorperazine  (COMPAZINE ) 10 MG tablet Take 1 tablet (10 mg total) by mouth every 6 (six) hours as needed for nausea or vomiting (Nausea or vomiting). 30 tablet 1   No current facility-administered medications for this visit.    SURGICAL HISTORY:  Past Surgical History:  Procedure Laterality Date   BACK SURGERY  15 years ago   BRONCHIAL BIOPSY  05/10/2022   Procedure: BRONCHIAL BIOPSIES;  Surgeon: Shelah Lamar RAMAN, MD;  Location: [facility] ENDOSCOPY;  Service: Pulmonary;;   BRONCHIAL BIOPSY  12/26/2023   Procedure: BRONCHOSCOPY, WITH BIOPSY;  Surgeon: Shelah Lamar RAMAN, MD;  Location: [facility] ENDOSCOPY;  Service: Pulmonary;;   BRONCHIAL BRUSHINGS  05/10/2022   Procedure: BRONCHIAL BRUSHINGS;  Surgeon: Shelah Lamar RAMAN, MD;  Location: [facility] ENDOSCOPY;  Service: Pulmonary;;   BRONCHIAL BRUSHINGS  12/26/2023   Procedure: BRONCHOSCOPY, WITH BRUSH BIOPSY;  Surgeon: Shelah Lamar RAMAN, MD;  Location: [facility] ENDOSCOPY;  Service: Pulmonary;;   BRONCHIAL NEEDLE ASPIRATION BIOPSY  05/10/2022   Procedure: BRONCHIAL NEEDLE ASPIRATION BIOPSIES;  Surgeon: Shelah Lamar RAMAN, MD;  Location: [facility] ENDOSCOPY;  Service: Pulmonary;;   BRONCHIAL NEEDLE ASPIRATION BIOPSY  12/26/2023   Procedure: BRONCHOSCOPY, WITH NEEDLE ASPIRATION BIOPSY;  Surgeon: Shelah Lamar RAMAN, MD;  Location: [facility] ENDOSCOPY;  Service: Pulmonary;;   COLONOSCOPY  2007   pt does not know MD name/normal exam per pt.   FIDUCIAL MARKER PLACEMENT  05/10/2022   Procedure: FIDUCIAL DYE MARKING;  Surgeon: Shelah Lamar RAMAN, MD;  Location: [facility] ENDOSCOPY;  Service: Pulmonary;;   FIDUCIAL MARKER PLACEMENT  12/26/2023   Procedure: INSERTION, FIDUCIAL MARKERS;  Surgeon: Shelah Lamar RAMAN, MD;  Location: [facility] ENDOSCOPY;  Service: Pulmonary;;   INTERCOSTAL NERVE BLOCK Right 05/10/2022   Procedure: INTERCOSTAL NERVE BLOCK;  Surgeon: Shyrl Linnie KIDD, MD;  Location: [facility] OR;  Service: Thoracic;  Laterality: Right;   NODE  DISSECTION Right 05/10/2022   Procedure: NODE DISSECTION;  Surgeon: Shyrl Linnie KIDD, MD;  Location: [facility] OR;  Service: Thoracic;  Laterality: Right;   PROSTATECTOMY  4 years ago   VIDEO BRONCHOSCOPY WITH ENDOBRONCHIAL NAVIGATION Left 12/26/2023   Procedure: VIDEO BRONCHOSCOPY WITH ENDOBRONCHIAL NAVIGATION;  Surgeon: Shelah Lamar RAMAN, MD;  Location: [facility] ENDOSCOPY;  Service: Pulmonary;  Laterality: Left;    REVIEW OF SYSTEMS:  Constitutional: negative Eyes: negative Ears, nose, mouth, throat, and face: positive for runny nose Respiratory: negative Cardiovascular: negative Gastrointestinal: negative Genitourinary:negative Integument/breast: negative Hematologic/lymphatic: negative Musculoskeletal:negative Neurological: positive for paresthesia Behavioral/Psych: negative Endocrine: negative Allergic/Immunologic: negative   PHYSICAL EXAMINATION: General appearance: alert, cooperative, fatigued, and no distress Head: Normocephalic, without obvious abnormality, atraumatic Neck: no adenopathy, no JVD, supple, symmetrical, trachea midline, and thyroid  not enlarged,  symmetric, no tenderness/mass/nodules Lymph nodes: Cervical, supraclavicular, and axillary nodes normal. Resp: clear to auscultation bilaterally Back: symmetric, no curvature. ROM normal. No CVA tenderness. Cardio: regular rate and rhythm, S1, S2 normal, no murmur, click, rub or gallop GI: soft, non-tender; bowel sounds normal; no masses,  no organomegaly Extremities: extremities normal, atraumatic, no cyanosis or edema Neurologic: Alert and oriented X 3, normal strength and tone. Normal symmetric reflexes. Normal coordination and gait  ECOG PERFORMANCE STATUS: 1 - Symptomatic but completely ambulatory  Blood pressure 132/68, pulse 100, temperature (!) 96.7 F (35.9 C), resp. rate 17, weight 178 lb 11.2 oz (81.1 kg), SpO2 99%.  LABORATORY DATA: Lab Results  Component Value Date   WBC 15.5 (H) 04/18/2024   HGB 10.1 (L)  04/18/2024   HCT 29.6 (L) 04/18/2024   MCV 97.0 04/18/2024   PLT 200 04/18/2024      Chemistry      Component Value Date/Time   NA 140 04/18/2024 0957   NA 138 04/27/2022 0847   K 3.7 04/18/2024 0957   CL 106 04/18/2024 0957   CO2 23 04/18/2024 0957   BUN 21 04/18/2024 0957   BUN 19 04/27/2022 0847   CREATININE 1.12 04/18/2024 0957   CREATININE 0.97 07/23/2020 0827      Component Value Date/Time   CALCIUM  9.3 04/18/2024 0957   ALKPHOS 90 04/18/2024 0957   AST 18 04/18/2024 0957   ALT 19 04/18/2024 0957   BILITOT 0.5 04/18/2024 0957       RADIOGRAPHIC STUDIES: CT Chest W Contrast Result Date: 04/12/2024 CLINICAL DATA:  Small cell lung cancer (SCLC), staging. * Tracking Code: BO * EXAM: CT CHEST WITH CONTRAST TECHNIQUE: Multidetector CT imaging of the chest was performed during intravenous contrast administration. RADIATION DOSE REDUCTION: This exam was performed according to the departmental dose-optimization program which includes automated exposure control, adjustment of the mA and/or kV according to patient size and/or use of iterative reconstruction technique. CONTRAST:  75mL OMNIPAQUE  IOHEXOL  300 MG/ML  SOLN COMPARISON:  Nuclear medicine PET scan from 11/03/2023 and CT scan chest from 10/17/2023. FINDINGS: Cardiovascular: Normal cardiac size. No pericardial effusion. No aortic aneurysm. There are coronary artery calcifications, in keeping with coronary artery disease. There are also mild peripheral atherosclerotic vascular calcifications of thoracic aorta and its major branches. Mediastinum/Nodes: Visualized thyroid  gland appears grossly unremarkable. No solid / cystic mediastinal masses. The esophagus is nondistended precluding optimal assessment. There are few mildly prominent mediastinal lymph nodes, which do not meet the size criteria for lymphadenopathy and appear slightly decreased in size since the prior study. Also the lymph nodes were not FDG avid on the prior PET-CT scan.  No axillary or hilar lymphadenopathy by size criteria. Lungs/Pleura: The central tracheo-bronchial tree is patent. Note is made of prior right upper lobectomy. No mass or consolidation. No pleural effusion or pneumothorax. Previously seen larger nodule in the left lung lower lobe is not seen on today's exam. The smaller nodule seen on the prior exam appear stable on today's exam measuring 4 x 5 mm (series 7, image 118). There are stable 2 additional 5 mm noncalcified opacities in the bilateral major fissures (series 2004, images 74 and 93), which are favored to represent intra fissural lymph nodes. No new or suspicious lung nodule. Upper Abdomen: There is a tiny sliding hiatal hernia. There is a single calcified granuloma in the subcapsular right hepatic dome. Remaining visualized upper abdominal viscera within normal limits. Musculoskeletal: The visualized soft tissues of the chest wall are grossly  unremarkable. No suspicious osseous lesions. There are mild multilevel degenerative changes in the visualized spine. IMPRESSION: 1. No metastatic disease identified within the chest. 2. Previously seen larger left lung lower lobe nodule is not seen on today's exam. The smaller nodule seen on the prior exam appear stable on today's exam. No new or suspicious lung nodule. 3. Multiple other nonacute observations, as described above. Aortic Atherosclerosis (ICD10-I70.0). Electronically Signed   By: Ree Molt M.D.   On: 04/12/2024 11:08   VAS US  CAROTID Result Date: 04/11/2024 Carotid Arterial Duplex Study Patient Name:  KROSS SWALLOWS  Date of Exam:   04/11/2024 Medical Rec #: [id number]            Accession #:    [id number] Date of Birth: 12/05/1955            Patient Gender: M Patient Age:   10 years Exam Location:  [facility] Procedure:      VAS US  CAROTID Referring Phys: ROSALINE BANE --------------------------------------------------------------------------------  Indications:       Patient denies any  cerebrovascular symptoms. Patient does                    report a intermittent thumping sensation in his brain. Risk Factors:      Hyperlipidemia, Diabetes, current smoker, coronary artery                    disease. Comparison Study:  NA Performing Technologist: Orvil Holmes RDCS  Examination Guidelines: A complete evaluation includes B-mode imaging, spectral Doppler, color Doppler, and power Doppler as needed of all accessible portions of each vessel. Bilateral testing is considered an integral part of a complete examination. Limited examinations for reoccurring indications may be performed as noted.  Right Carotid Findings: +----------+--------+--------+--------+------------------+--------+           PSV cm/sEDV cm/sStenosisPlaque DescriptionComments +----------+--------+--------+--------+------------------+--------+ CCA Prox  177     26                                         +----------+--------+--------+--------+------------------+--------+ CCA Distal94      23                                         +----------+--------+--------+--------+------------------+--------+ ICA Prox  96      25              homogeneous                +----------+--------+--------+--------+------------------+--------+ ICA Mid   109     31                                         +----------+--------+--------+--------+------------------+--------+ ICA Distal102     30                                         +----------+--------+--------+--------+------------------+--------+ ECA       115     14                                         +----------+--------+--------+--------+------------------+--------+ +----------+--------+-------+----------------+-------------------+  PSV cm/sEDV cmsDescribe        Arm Pressure (mmHG) +----------+--------+-------+----------------+-------------------+ [id number]     4      Multiphasic, WNL                     +----------+--------+-------+----------------+-------------------+ +---------+--------+--+--------+--+---------+ VertebralPSV cm/s47EDV cm/s17Antegrade +---------+--------+--+--------+--+---------+  Left Carotid Findings: +----------+--------+--------+--------+------------------+--------+           PSV cm/sEDV cm/sStenosisPlaque DescriptionComments +----------+--------+--------+--------+------------------+--------+ CCA Prox  167     25                                         +----------+--------+--------+--------+------------------+--------+ CCA Distal76      23                                         +----------+--------+--------+--------+------------------+--------+ ICA Prox  93      32              smooth                     +----------+--------+--------+--------+------------------+--------+ ICA Mid   100     29                                         +----------+--------+--------+--------+------------------+--------+ ICA Distal114     40                                         +----------+--------+--------+--------+------------------+--------+ ECA       119     17                                tortuous +----------+--------+--------+--------+------------------+--------+ +----------+--------+--------+----------------+-------------------+           PSV cm/sEDV cm/sDescribe        Arm Pressure (mmHG) +----------+--------+--------+----------------+-------------------+ [id number]      2       Multiphasic, WNL                    +----------+--------+--------+----------------+-------------------+ +---------+--------+--+--------+--+---------+ VertebralPSV cm/s42EDV cm/s12Antegrade +---------+--------+--+--------+--+---------+   Summary: Right Carotid: The extracranial vessels were near-normal with only minimal wall                thickening or plaque. Left Carotid: The extracranial vessels were near-normal with only minimal wall                thickening or plaque. Vertebrals:  Bilateral vertebral arteries demonstrate antegrade flow. Subclavians: Normal flow hemodynamics were seen in bilateral subclavian              arteries. *See table(s) above for measurements and observations.  Electronically signed by Maude Emmer MD on 04/11/2024 at 11:34:56 AM.    Final      ASSESSMENT AND PLAN: This is a very pleasant 68 years old white male with:  1) stage Ia (T1c, N0, M0) small cell lung cancer presented with left lower lobe lung nodule diagnosed in May 2025. 2) History of stage IA (T1c, N0, M0) non-small cell lung cancer, squamous cell carcinoma presented with  right upper lobe in July 2023 status post right upper lobectomy with lymph node sampling under the care of Dr. Shyrl on 05/10/2022 with tumor size of 2.3 cm.  He had repeat PET scan performed recently that showed hypermetabolic left lower lobe lung nodule with no other evidence of metastatic disease to the mediastinal lymph node or distant metastasis. He had repeat bronchoscopy with biopsy of the left lower lobe lung nodule and unfortunately the final pathology was consistent with a small cell carcinoma. He started systemic chemotherapy with carboplatin  for AUC of 5 on day 1 and etoposide  100 Mg/M2 on days 1, 2 and 3 every 3 weeks for 4 cycles concurrent with SBRT to the left lower lobe lung nodule.  First dose was 01/23/2024 He is status post 4 cycle of treatment.  He tolerated his treatment well except for the fatigue. He had repeat CT scan of the chest performed recently.  I personally and independently reviewed the scan and discussed the result with the patient today.  PET scan showed improvement of his disease with no concerning findings for progression. Assessment and Plan Assessment & Plan Stage IA small cell lung cancer, left lower lobe, post-chemoradiation, under surveillance Stage IA small cell lung cancer in the left lower lobe, previously treated with four cycles of carboplatin   and etoposide  chemotherapy and concurrent stereotactic body radiation therapy (SBRT). Recent CT scan shows no evidence of disease progression or metastasis. - Continue surveillance with regular CT scans. - Refer to Dr. Dewey for discussion regarding prophylactic cranial irradiation due to risk of brain metastasis. - Schedule follow-up appointment in three months with repeat CT scan.  Stage IA non-small cell lung cancer, right upper lobe, post-lobectomy Stage IA non-small cell lung cancer, squamous cell carcinoma, in the right upper lobe, status post right upper lobectomy in July 2023. No current evidence of recurrence or metastasis on recent imaging.  Chemotherapy-induced peripheral neuropathy Peripheral neuropathy, worsened following chemotherapy. Symptoms include significant neuropathy, which was present prior to chemotherapy but has exacerbated post-treatment. While complete resolution may not occur, some improvement is expected over time.  Anemia secondary to chemotherapy Mild anemia secondary to chemotherapy, with blood counts showing signs of recovery. - Monitor blood counts at the next follow-up in three months. The patient was advised to call immediately if he has any other concerning symptoms in the interval.  The patient voices understanding of current disease status and treatment options and is in agreement with the current care plan.  All questions were answered. The patient knows to call the clinic with any problems, questions or concerns. We can certainly see the patient much sooner if necessary.  The total time spent in the appointment was 30 minutes.  Disclaimer: This note was dictated with voice recognition software. Similar sounding words can inadvertently be transcribed and may not be corrected upon review.

## 2024-05-02 ENCOUNTER — Ambulatory Visit: Admitting: Radiation Oncology

## 2024-05-02 ENCOUNTER — Ambulatory Visit

## 2024-05-02 ENCOUNTER — Other Ambulatory Visit (HOSPITAL_BASED_OUTPATIENT_CLINIC_OR_DEPARTMENT_OTHER): Payer: Self-pay | Admitting: Internal Medicine

## 2024-05-02 ENCOUNTER — Other Ambulatory Visit: Payer: Self-pay | Admitting: Family Medicine

## 2024-05-02 NOTE — Progress Notes (Signed)
 Patient has completed radiation therapy to his left lower lobe in June 2025.  He is here to discuss prophylactic cranial irradiation.    SAFETY ISSUES: Prior radiation? SBRT LLL 01/2024 Pacemaker/ICD? No Possible current pregnancy? N/a Is the patient on methotrexate? No  Current Complaints / other details:

## 2024-05-02 NOTE — Progress Notes (Incomplete)
 Radiation Oncology         [phone] ________________________________  Name: Gregory Jensen        MRN: [id number]  Date of Service: 05/03/2024 DOB: Apr 13, 1956  RR:Ejmxzm, Worth HERO, MD  Gregory Sherrod, MD     REFERRING PHYSICIAN: Sherrod Sherrod, MD   DIAGNOSIS: The encounter diagnosis was Malignant neoplasm of upper lobe of right lung [facility]).   HISTORY OF PRESENT ILLNESS: Gregory Jensen is a 68 y.o. male seen at the request of Dr. Sherrod for a newly diagnosed small cell lung cancer with a history of Stage IA3, cT1cNM0, NSCLC, Squamous cell carcinoma of the RUL. The patient was diagnosed in July 2023, and he was treated with right upper lobectomy with Dr. Shyrl on 05/10/22. He was in observation until a CT on 10/17/23 that showed stability in the RUL but what had been a 5 mm nodule in the LLL in September 2024 CT was now measuring up to 1.8 cm. A PET scan on 11/03/23 showed hypermetabolic uptake in the LLL nodule measuring 2.1 cm, with an SUV of 9.6. No other hypermetabolic uptake was noted. He underwent a bronchoscopy on 12/26/23 and RML endobronchial biopsy and brushing were negative for malignancy, but the LLL FNA showed small cell carcinoma. An MRI brain on 01/17/24 showed no metastatic disease, but confirmed a known dural based lesion in the left frontal convexity measuring 11 mm, felt to be most consistent with meningioma.   He completed SBRT to the LLL in June of 2025. He then went on to receive systemic chemotherapy between 02/13/24-03/28/24. He had post treatment imaging with CT of the chest on 04/12/2024 that showed no evidence of metastatic disease within the chest and the previously treated nodule in the left lower lung had improved.  No new or suspicious disease was appreciated, and postoperative changes were noted in the RUL.  He is seen to consider prophylactic cranial irradiation.   PREVIOUS RADIATION THERAPY:   01/17/24-01/23/24 SBRT Treatment Site/dose:   The tumor in  the LLL was treated with a course of stereotactic body radiation treatment. The patient received 54 Gy In 3 fractions at 18 Gy per fraction.   PAST MEDICAL HISTORY:  Past Medical History:  Diagnosis Date   Cancer (HCC) 4 years ago   Prostate Cancer , Lung Cancer   Diabetes mellitus without complication (HCC)    type 2   Hyperlipidemia    Hypertension    Lung cancer (HCC)    Left Lower Lobe Lung   Neuromuscular disorder (HCC)    neuropathy   Neuropathy    in legs causing balance issues per patient   Sleep apnea    Smoker        PAST SURGICAL HISTORY: Past Surgical History:  Procedure Laterality Date   BACK SURGERY  15 years ago   BRONCHIAL BIOPSY  05/10/2022   Procedure: BRONCHIAL BIOPSIES;  Surgeon: Shelah Lamar RAMAN, MD;  Location: [facility] ENDOSCOPY;  Service: Pulmonary;;   BRONCHIAL BIOPSY  12/26/2023   Procedure: BRONCHOSCOPY, WITH BIOPSY;  Surgeon: Shelah Lamar RAMAN, MD;  Location: [facility] ENDOSCOPY;  Service: Pulmonary;;   BRONCHIAL BRUSHINGS  05/10/2022   Procedure: BRONCHIAL BRUSHINGS;  Surgeon: Shelah Lamar RAMAN, MD;  Location: [facility] ENDOSCOPY;  Service: Pulmonary;;   BRONCHIAL BRUSHINGS  12/26/2023   Procedure: BRONCHOSCOPY, WITH BRUSH BIOPSY;  Surgeon: Shelah Lamar RAMAN, MD;  Location: [facility] ENDOSCOPY;  Service: Pulmonary;;   BRONCHIAL NEEDLE ASPIRATION BIOPSY  05/10/2022   Procedure: BRONCHIAL NEEDLE ASPIRATION BIOPSIES;  Surgeon: Shelah Lamar RAMAN, MD;  Location: [facility] ENDOSCOPY;  Service: Pulmonary;;   BRONCHIAL NEEDLE ASPIRATION BIOPSY  12/26/2023   Procedure: BRONCHOSCOPY, WITH NEEDLE ASPIRATION BIOPSY;  Surgeon: Shelah Lamar RAMAN, MD;  Location: [facility] ENDOSCOPY;  Service: Pulmonary;;   COLONOSCOPY  2007   pt does not know MD name/normal exam per pt.   FIDUCIAL MARKER PLACEMENT  05/10/2022   Procedure: FIDUCIAL DYE MARKING;  Surgeon: Shelah Lamar RAMAN, MD;  Location: [facility] ENDOSCOPY;  Service: Pulmonary;;   FIDUCIAL MARKER PLACEMENT  12/26/2023   Procedure: INSERTION, FIDUCIAL MARKERS;  Surgeon:  Shelah Lamar RAMAN, MD;  Location: [facility] ENDOSCOPY;  Service: Pulmonary;;   INTERCOSTAL NERVE BLOCK Right 05/10/2022   Procedure: INTERCOSTAL NERVE BLOCK;  Surgeon: Shyrl Linnie KIDD, MD;  Location: [facility] OR;  Service: Thoracic;  Laterality: Right;   NODE DISSECTION Right 05/10/2022   Procedure: NODE DISSECTION;  Surgeon: Shyrl Linnie KIDD, MD;  Location: [facility] OR;  Service: Thoracic;  Laterality: Right;   PROSTATECTOMY  4 years ago   VIDEO BRONCHOSCOPY WITH ENDOBRONCHIAL NAVIGATION Left 12/26/2023   Procedure: VIDEO BRONCHOSCOPY WITH ENDOBRONCHIAL NAVIGATION;  Surgeon: Shelah Lamar RAMAN, MD;  Location: [facility] ENDOSCOPY;  Service: Pulmonary;  Laterality: Left;     FAMILY HISTORY:  Family History  Problem Relation Age of Onset   Alzheimer's disease Mother    Heart disease Father    Hypertension Sister    Heart disease Sister    Hypertension Sister    Stomach cancer Brother    Diabetes Other        family hx   Colon cancer Neg Hx    Colon polyps Neg Hx    Esophageal cancer Neg Hx    Rectal cancer Neg Hx      SOCIAL HISTORY:  reports that he has been smoking cigarettes. He has a 47 pack-year smoking history. He has never used smokeless tobacco. He reports that he does not currently use alcohol. He reports that he does not use drugs. The patient is in a relationship. He lives in [place] and is retired from working in a maintenance position. He has an adult son who lives in [place].    ALLERGIES: Jardiance  [empagliflozin ]   MEDICATIONS:  Current Outpatient Medications  Medication Sig Dispense Refill   aspirin  81 MG EC tablet Take 81 mg by mouth in the morning.     atorvastatin  (LIPITOR) 40 MG tablet TAKE 1 TABLET BY MOUTH EVERY DAY 90 tablet 0   Blood Glucose Monitoring Suppl (ONE TOUCH ULTRA 2) w/Device KIT Used TID to check glucose or PRN Code E11.9 1 kit 0   cyanocobalamin  1000 MCG tablet Take 1,000 mcg by mouth daily.     fenofibrate  (TRICOR ) 145 MG tablet TAKE 1 TABLET BY MOUTH  EVERY DAY 90 tablet 0   gabapentin  (NEURONTIN ) 100 MG capsule TAKE 1-2 CAPSULES (100-200 MG TOTAL) BY MOUTH AT BEDTIME. 60 capsule 1   glucose blood (ONETOUCH VERIO) test strip 1 each by Other route daily as needed for other. Use as instructed 100 each 4   ibuprofen (ADVIL) 200 MG tablet Take 400-600 mg by mouth daily as needed (pain.).     losartan  (COZAAR ) 50 MG tablet TAKE 1 TABLET BY MOUTH EVERY DAY 90 tablet 3   metFORMIN  (GLUCOPHAGE ) 500 MG tablet TAKE 1 TABLET BY MOUTH TWICE A DAY WITH FOOD 180 tablet 1   ondansetron  (ZOFRAN ) 8 MG tablet Take 1 tablet (8 mg total) by mouth every 8 (eight) hours as needed for nausea or vomiting.  Start on third day after chemotherapy. 30 tablet 1   OneTouch Delica Lancets 33G MISC 1 each by Does not apply route daily as needed. 100 each 4   oxyCODONE -acetaminophen  (PERCOCET/ROXICET) 5-325 MG tablet Take 1 tablet by mouth every 8 (eight) hours as needed for severe pain (pain score 7-10). 30 tablet 0   prochlorperazine  (COMPAZINE ) 10 MG tablet Take 1 tablet (10 mg total) by mouth every 6 (six) hours as needed for nausea or vomiting (Nausea or vomiting). 30 tablet 1   No current facility-administered medications for this visit.     REVIEW OF SYSTEMS: On review of systems, the patient reports that he is doing pretty well. Other than peripheral neuropathy he reports he has done very well with tolerating chemotherapy. He has taken Neurontin  at night time which helps him sleep and relieved some of the restlessness of his symptoms. He has had episodes of dizziness when moving from a seated to a standing position. He reports he has stopped taking his cozar due to low blood pressures, but has it in his list of medications to take if his pressures were to increase. He is not taking pain medication, or antiemetics. No other complaints are verbalized.      PHYSICAL EXAM:  Wt Readings from Last 3 Encounters:  04/25/24 178 lb 11.2 oz (81.1 kg)  04/19/24 181 lb 12.8 oz  (82.5 kg)  03/26/24 178 lb (80.7 kg)   Temp Readings from Last 3 Encounters:  04/25/24 (!) 96.7 F (35.9 C)  04/19/24 98 F (36.7 C) (Temporal)  04/14/24 (!) 97.5 F (36.4 C) (Temporal)   BP Readings from Last 3 Encounters:  04/25/24 132/68  04/19/24 (!) 153/71  04/14/24 (!) 148/59   Pulse Readings from Last 3 Encounters:  04/25/24 100  04/19/24 90  04/14/24 95    /10  In general this is a well appearing caucasian male in no acute distress. He's alert and oriented x4 and appropriate throughout the examination. Cardiopulmonary assessment is negative for acute distress and he exhibits normal effort.     ECOG = 1  0 - Asymptomatic (Fully active, able to carry on all predisease activities without restriction)  1 - Symptomatic but completely ambulatory (Restricted in physically strenuous activity but ambulatory and able to carry out work of a light or sedentary nature. For example, light housework, office work)  2 - Symptomatic, <50% in bed during the day (Ambulatory and capable of all self care but unable to carry out any work activities. Up and about more than 50% of waking hours)  3 - Symptomatic, >50% in bed, but not bedbound (Capable of only limited self-care, confined to bed or chair 50% or more of waking hours)  4 - Bedbound (Completely disabled. Cannot carry on any self-care. Totally confined to bed or chair)  5 - Death   Raylene MM, Creech RH, Tormey DC, et al. [phone]). Toxicity and response criteria of the [facility]. Am. DOROTHA Bridges. Oncol. 5 (6): 649-55    LABORATORY DATA:  Lab Results  Component Value Date   WBC 15.5 (H) 04/18/2024   HGB 10.1 (L) 04/18/2024   HCT 29.6 (L) 04/18/2024   MCV 97.0 04/18/2024   PLT 200 04/18/2024   Lab Results  Component Value Date   NA 140 04/18/2024   K 3.7 04/18/2024   CL 106 04/18/2024   CO2 23 04/18/2024   Lab Results  Component Value Date   ALT 19 04/18/2024   AST 18 04/18/2024  ALKPHOS 90  04/18/2024   BILITOT 0.5 04/18/2024      RADIOGRAPHY: CT Chest W Contrast Result Date: 04/12/2024 CLINICAL DATA:  Small cell lung cancer (SCLC), staging. * Tracking Code: BO * EXAM: CT CHEST WITH CONTRAST TECHNIQUE: Multidetector CT imaging of the chest was performed during intravenous contrast administration. RADIATION DOSE REDUCTION: This exam was performed according to the departmental dose-optimization program which includes automated exposure control, adjustment of the mA and/or kV according to patient size and/or use of iterative reconstruction technique. CONTRAST:  75mL OMNIPAQUE  IOHEXOL  300 MG/ML  SOLN COMPARISON:  Nuclear medicine PET scan from 11/03/2023 and CT scan chest from 10/17/2023. FINDINGS: Cardiovascular: Normal cardiac size. No pericardial effusion. No aortic aneurysm. There are coronary artery calcifications, in keeping with coronary artery disease. There are also mild peripheral atherosclerotic vascular calcifications of thoracic aorta and its major branches. Mediastinum/Nodes: Visualized thyroid  gland appears grossly unremarkable. No solid / cystic mediastinal masses. The esophagus is nondistended precluding optimal assessment. There are few mildly prominent mediastinal lymph nodes, which do not meet the size criteria for lymphadenopathy and appear slightly decreased in size since the prior study. Also the lymph nodes were not FDG avid on the prior PET-CT scan. No axillary or hilar lymphadenopathy by size criteria. Lungs/Pleura: The central tracheo-bronchial tree is patent. Note is made of prior right upper lobectomy. No mass or consolidation. No pleural effusion or pneumothorax. Previously seen larger nodule in the left lung lower lobe is not seen on today's exam. The smaller nodule seen on the prior exam appear stable on today's exam measuring 4 x 5 mm (series 7, image 118). There are stable 2 additional 5 mm noncalcified opacities in the bilateral major fissures (series 2004, images  74 and 93), which are favored to represent intra fissural lymph nodes. No new or suspicious lung nodule. Upper Abdomen: There is a tiny sliding hiatal hernia. There is a single calcified granuloma in the subcapsular right hepatic dome. Remaining visualized upper abdominal viscera within normal limits. Musculoskeletal: The visualized soft tissues of the chest wall are grossly unremarkable. No suspicious osseous lesions. There are mild multilevel degenerative changes in the visualized spine. IMPRESSION: 1. No metastatic disease identified within the chest. 2. Previously seen larger left lung lower lobe nodule is not seen on today's exam. The smaller nodule seen on the prior exam appear stable on today's exam. No new or suspicious lung nodule. 3. Multiple other nonacute observations, as described above. Aortic Atherosclerosis (ICD10-I70.0). Electronically Signed   By: Ree Molt M.D.   On: 04/12/2024 11:08   VAS US  CAROTID Result Date: 04/11/2024 Carotid Arterial Duplex Study Patient Name:  TRAYTON SZABO  Date of Exam:   04/11/2024 Medical Rec #: [id number]            Accession #:    [id number] Date of Birth: 1956-04-18            Patient Gender: M Patient Age:   73 years Exam Location:  [facility] Procedure:      VAS US  CAROTID Referring Phys: ROSALINE BANE --------------------------------------------------------------------------------  Indications:       Patient denies any cerebrovascular symptoms. Patient does                    report a intermittent thumping sensation in his brain. Risk Factors:      Hyperlipidemia, Diabetes, current smoker, coronary artery                    disease.  Comparison Study:  NA Performing Technologist: Orvil Holmes RDCS  Examination Guidelines: A complete evaluation includes B-mode imaging, spectral Doppler, color Doppler, and power Doppler as needed of all accessible portions of each vessel. Bilateral testing is considered an integral part of a complete examination.  Limited examinations for reoccurring indications may be performed as noted.  Right Carotid Findings: +----------+--------+--------+--------+------------------+--------+           PSV cm/sEDV cm/sStenosisPlaque DescriptionComments +----------+--------+--------+--------+------------------+--------+ CCA Prox  177     26                                         +----------+--------+--------+--------+------------------+--------+ CCA Distal94      23                                         +----------+--------+--------+--------+------------------+--------+ ICA Prox  96      25              homogeneous                +----------+--------+--------+--------+------------------+--------+ ICA Mid   109     31                                         +----------+--------+--------+--------+------------------+--------+ ICA Distal102     30                                         +----------+--------+--------+--------+------------------+--------+ ECA       115     14                                         +----------+--------+--------+--------+------------------+--------+ +----------+--------+-------+----------------+-------------------+           PSV cm/sEDV cmsDescribe        Arm Pressure (mmHG) +----------+--------+-------+----------------+-------------------+ [id number]     4      Multiphasic, WNL                    +----------+--------+-------+----------------+-------------------+ +---------+--------+--+--------+--+---------+ VertebralPSV cm/s47EDV cm/s17Antegrade +---------+--------+--+--------+--+---------+  Left Carotid Findings: +----------+--------+--------+--------+------------------+--------+           PSV cm/sEDV cm/sStenosisPlaque DescriptionComments +----------+--------+--------+--------+------------------+--------+ CCA Prox  167     25                                          +----------+--------+--------+--------+------------------+--------+ CCA Distal76      23                                         +----------+--------+--------+--------+------------------+--------+ ICA Prox  93      32              smooth                     +----------+--------+--------+--------+------------------+--------+ ICA Mid  100     29                                         +----------+--------+--------+--------+------------------+--------+ ICA Distal114     40                                         +----------+--------+--------+--------+------------------+--------+ ECA       119     17                                tortuous +----------+--------+--------+--------+------------------+--------+ +----------+--------+--------+----------------+-------------------+           PSV cm/sEDV cm/sDescribe        Arm Pressure (mmHG) +----------+--------+--------+----------------+-------------------+ Dlarojcpjw35      2       Multiphasic, WNL                    +----------+--------+--------+----------------+-------------------+ +---------+--------+--+--------+--+---------+ VertebralPSV cm/s42EDV cm/s12Antegrade +---------+--------+--+--------+--+---------+   Summary: Right Carotid: The extracranial vessels were near-normal with only minimal wall                thickening or plaque. Left Carotid: The extracranial vessels were near-normal with only minimal wall               thickening or plaque. Vertebrals:  Bilateral vertebral arteries demonstrate antegrade flow. Subclavians: Normal flow hemodynamics were seen in bilateral subclavian              arteries. *See table(s) above for measurements and observations.  Electronically signed by Maude Emmer MD on 04/11/2024 at 11:34:56 AM.    Final        IMPRESSION/PLAN: 1. Limited Stage, cT1cN0M0, small cell carcinoma of the LLL.  We discussed the patient's course since his last visit and he has been managing the  chemotherapy-induced neuropathic symptoms.  Outside of this, his CT scans have shown improvement in the treated area with the SBRT as well as presumed to be benefited by his recent chemotherapy treatment.  We discussed the rationale for prophylactic cranial radiation.  We outlined that this would involve an MRI scan for baseline assessment prior to proceeding, simulation without mask, 10 treatments to the entire brain sparing the hippocampus if restaging MRI is negative for disease.  After discussing this and also considering the risks, benefits, short and long-term effects of radiotherapy as well as the alternative of following him in surveillance with routine MRI scan, the patient elects to proceed with 3T MRI scan at this time, and be followed in surveillance.  2. Stage IA3, cT1cNM0, NSCLC, Squamous cell carcinoma of the RUL.  The patient continues to be followed in surveillance following his lobectomy and has been attending rounds of recurrent disease.  This will be followed along with send 1. 3. Chemotherapy induced peripheral neuropathy. The patient will continue his Gabapentin  at night. We also discussed possibly considering OTC Vitamin B6.  4. Dizziness. As above we will follow up with his MRI results. I encouraged him to try to keep track of his BPs at home and take routine readings, as well as measure this if he feels dizzy as his symptoms could be from orthostasis.   In a visit lasting 45 minutes, greater than 50% of the time  was spent face to face discussing the patient's condition, in preparation for the discussion, and coordinating the patient's care.   The above documentation reflects my direct findings during this shared patient visit. Please see the separate note by Dr. Dewey on this date for the remainder of the patient's plan of care.    Donald KYM Husband, [facility]   **Disclaimer: This note was dictated with voice recognition software. Similar sounding words can inadvertently be  transcribed and this note may contain transcription errors which may not have been corrected upon publication of note.**

## 2024-05-03 ENCOUNTER — Encounter: Payer: Self-pay | Admitting: Radiation Oncology

## 2024-05-03 ENCOUNTER — Ambulatory Visit
Admission: RE | Admit: 2024-05-03 | Discharge: 2024-05-03 | Disposition: A | Discharge status: Home / Self Care | Source: Ambulatory Visit | Attending: Radiation Oncology | Admitting: Radiation Oncology

## 2024-05-03 VITALS — BP 150/72 | HR 97 | Temp 97.3°F | Resp 20 | Ht 68.0 in | Wt 179.8 lb

## 2024-05-03 DIAGNOSIS — Z79899 Other long term (current) drug therapy: Secondary | ICD-10-CM | POA: Diagnosis not present

## 2024-05-03 DIAGNOSIS — T451X5A Adverse effect of antineoplastic and immunosuppressive drugs, initial encounter: Secondary | ICD-10-CM | POA: Insufficient documentation

## 2024-05-03 DIAGNOSIS — E119 Type 2 diabetes mellitus without complications: Secondary | ICD-10-CM | POA: Diagnosis not present

## 2024-05-03 DIAGNOSIS — C3411 Malignant neoplasm of upper lobe, right bronchus or lung: Secondary | ICD-10-CM

## 2024-05-03 DIAGNOSIS — C3432 Malignant neoplasm of lower lobe, left bronchus or lung: Secondary | ICD-10-CM | POA: Insufficient documentation

## 2024-05-03 DIAGNOSIS — Z923 Personal history of irradiation: Secondary | ICD-10-CM | POA: Diagnosis not present

## 2024-05-03 DIAGNOSIS — I10 Essential (primary) hypertension: Secondary | ICD-10-CM | POA: Insufficient documentation

## 2024-05-03 DIAGNOSIS — F17210 Nicotine dependence, cigarettes, uncomplicated: Secondary | ICD-10-CM | POA: Insufficient documentation

## 2024-05-03 DIAGNOSIS — Z7984 Long term (current) use of oral hypoglycemic drugs: Secondary | ICD-10-CM | POA: Diagnosis not present

## 2024-05-03 DIAGNOSIS — G629 Polyneuropathy, unspecified: Secondary | ICD-10-CM | POA: Diagnosis not present

## 2024-05-03 DIAGNOSIS — E785 Hyperlipidemia, unspecified: Secondary | ICD-10-CM | POA: Insufficient documentation

## 2024-05-03 DIAGNOSIS — R42 Dizziness and giddiness: Secondary | ICD-10-CM | POA: Insufficient documentation

## 2024-05-03 DIAGNOSIS — Z0289 Encounter for other administrative examinations: Secondary | ICD-10-CM | POA: Insufficient documentation

## 2024-05-03 DIAGNOSIS — E1142 Type 2 diabetes mellitus with diabetic polyneuropathy: Secondary | ICD-10-CM | POA: Insufficient documentation

## 2024-05-03 DIAGNOSIS — I700 Atherosclerosis of aorta: Secondary | ICD-10-CM | POA: Insufficient documentation

## 2024-05-03 DIAGNOSIS — Z800 Family history of malignant neoplasm of digestive organs: Secondary | ICD-10-CM | POA: Diagnosis not present

## 2024-05-03 DIAGNOSIS — G4730 Sleep apnea, unspecified: Secondary | ICD-10-CM | POA: Diagnosis not present

## 2024-05-03 DIAGNOSIS — Z8546 Personal history of malignant neoplasm of prostate: Secondary | ICD-10-CM | POA: Insufficient documentation

## 2024-05-03 DIAGNOSIS — I2510 Atherosclerotic heart disease of native coronary artery without angina pectoris: Secondary | ICD-10-CM | POA: Insufficient documentation

## 2024-05-03 DIAGNOSIS — G620 Drug-induced polyneuropathy: Secondary | ICD-10-CM | POA: Diagnosis not present

## 2024-05-09 ENCOUNTER — Ambulatory Visit
Admission: RE | Admit: 2024-05-09 | Discharge: 2024-05-09 | Disposition: A | Discharge status: Home / Self Care | Source: Ambulatory Visit | Attending: Radiation Oncology | Admitting: Radiation Oncology

## 2024-05-09 DIAGNOSIS — R9082 White matter disease, unspecified: Secondary | ICD-10-CM | POA: Diagnosis not present

## 2024-05-09 DIAGNOSIS — C3411 Malignant neoplasm of upper lobe, right bronchus or lung: Secondary | ICD-10-CM

## 2024-05-09 DIAGNOSIS — C3432 Malignant neoplasm of lower lobe, left bronchus or lung: Secondary | ICD-10-CM

## 2024-05-09 MED ORDER — GADOPICLENOL 0.5 MMOL/ML IV SOLN
8.5000 mL | Freq: Once | INTRAVENOUS | Status: AC | PRN
Start: 2024-05-09 — End: 2024-05-09
  Administered 2024-05-09: 8.5 mL via INTRAVENOUS

## 2024-05-10 ENCOUNTER — Encounter: Payer: Self-pay | Admitting: Radiation Oncology

## 2024-05-10 ENCOUNTER — Ambulatory Visit: Payer: Self-pay | Admitting: Radiation Oncology

## 2024-05-11 ENCOUNTER — Other Ambulatory Visit: Payer: Self-pay | Admitting: Radiation Therapy

## 2024-05-14 ENCOUNTER — Inpatient Hospital Stay

## 2024-05-15 ENCOUNTER — Telehealth: Payer: Self-pay | Admitting: Radiation Oncology

## 2024-05-15 DIAGNOSIS — C3432 Malignant neoplasm of lower lobe, left bronchus or lung: Secondary | ICD-10-CM

## 2024-05-15 NOTE — Telephone Encounter (Signed)
 I called the patient to let him know his MRI results and the rationale to continue surveillance. We also discussed the meningioma that was seen on his last two MRI scans but was not called in 2024's scan. In brain oncology conference, Dr. Shona felt that this was stable and persistent though not previously called. This will be followed closely but based on conference discussion does not need intervention at this time. The patient is pleased with this. I'll order his next MRI scan in 3 months.

## 2024-05-21 ENCOUNTER — Encounter: Payer: Self-pay | Admitting: Pharmacist

## 2024-05-21 NOTE — Progress Notes (Signed)
 Pharmacy Quality Measure Review  This patient is appearing on a report for being at risk of failing the adherence measure for cholesterol (statin), diabetes, and hypertension (ACEi/ARB) medications this calendar year.   Medication: metformin  500mg  Last fill date: 02/06/2024 for 90 day supply per adherence report  Reviewed recent refill history in Dr Annemarie database. Actual last refill date was 05/02/2024 for 90 day supply. Patient has 1 refill remaining. Next appointment with PCP is 07/20/2024.   Medication: losartan  50mg  Last fill date: 02/06/2024 for 90 day supply per adherence report  Reviewed recent refill history in Dr Annemarie database. Actual last refill date was 05/02/2024 for 90 day supply. Patient has 3 refills remaining.   Medication: atorvastatin  40mg  Last fill date: 02/06/2024 for 90 day supply per adherence report  Reviewed recent refill history in Dr Annemarie database. Actual last refill date was 03/27/2024 for 90 day supply. Patient has no refills remaining. Next appointment with cardiologist Dr Mona is not currently scheduled.    Insurance report was not up to date. No action needed at this time.  Will follow adherence in 2025 - next check in November 2025.    Madelin Ray, PharmD Clinical Pharmacist [facility]  Population Health [phone]

## 2024-05-22 ENCOUNTER — Telehealth: Payer: Self-pay

## 2024-05-22 ENCOUNTER — Telehealth: Payer: Self-pay | Admitting: Medical Oncology

## 2024-05-22 NOTE — Telephone Encounter (Signed)
 Dental extraction pending. I told 'Irinia @ dental clinic that pt is not on any tx from Dr. Sherrod and to get medical clearance from PCP.

## 2024-05-22 NOTE — Telephone Encounter (Signed)
 Copied from CRM #[id number]. Topic: General - Other >> May 22, 2024  2:36 PM Alfonso HERO wrote: Reason for CRM: Dr Caron Edison DDS office called wanting to know if Dr Kennyth requires pre med for extraction of a tooth for the patient. They can be reached at [phone].

## 2024-05-23 NOTE — Telephone Encounter (Signed)
 Please advice

## 2024-05-24 ENCOUNTER — Encounter: Payer: Self-pay | Admitting: Family Medicine

## 2024-05-24 NOTE — Telephone Encounter (Signed)
 Spoke with Curt, notified Per Dr Kennyth no need any pre procedure medications for tooth Extraction  Arena Requesting a letter with inforamtion faxed to [phone]

## 2024-05-24 NOTE — Telephone Encounter (Signed)
 He does not need any pre procedure medications.  Worth HERO. Kennyth, MD 05/24/2024 7:30 AM

## 2024-05-24 NOTE — Telephone Encounter (Signed)
 Letter faxed to [phone]

## 2024-06-06 ENCOUNTER — Other Ambulatory Visit: Payer: Self-pay | Admitting: Physician Assistant

## 2024-06-06 DIAGNOSIS — Z5111 Encounter for antineoplastic chemotherapy: Secondary | ICD-10-CM

## 2024-06-06 DIAGNOSIS — C3432 Malignant neoplasm of lower lobe, left bronchus or lung: Secondary | ICD-10-CM

## 2024-06-22 ENCOUNTER — Other Ambulatory Visit (HOSPITAL_BASED_OUTPATIENT_CLINIC_OR_DEPARTMENT_OTHER): Payer: Self-pay | Admitting: Internal Medicine

## 2024-07-02 ENCOUNTER — Telehealth: Payer: Self-pay | Admitting: Surgery

## 2024-07-02 ENCOUNTER — Other Ambulatory Visit: Payer: Self-pay | Admitting: Internal Medicine

## 2024-07-02 DIAGNOSIS — C3490 Malignant neoplasm of unspecified part of unspecified bronchus or lung: Secondary | ICD-10-CM

## 2024-07-02 NOTE — Telephone Encounter (Signed)
 Pt called stating he was trying to schedule his next CT scan but was unable to do so because the order was not in. Advised pt Dr Sherrod would be notified and our office will reach out to him and let him know once ordered.

## 2024-07-05 ENCOUNTER — Encounter: Payer: Self-pay | Admitting: Pharmacist

## 2024-07-05 NOTE — Progress Notes (Signed)
 Pharmacy Quality Measure Review  This patient is appearing on a report for being at risk of failing the adherence measure for cholesterol (statin), diabetes, and hypertension (ACEi/ARB) medications this calendar year.   Medication: metformin  500mg  Last fill date: 05/02/2024 for 90 day supply per adherence report Patient has 1 refill remaining. Next appointment with PCP is 07/20/2024.   Medication: losartan  50mg  Last fill date: 05/02/2024 or 90 day supply per adherence report Patient has 3 refills remaining.   Medication: atorvastatin  40mg  Last fill date: 03/27/2024 for 90 day supply per adherence report  Reviewed recent refill history in Dr Annemarie database. Actual last refill date was 06/22/2024 for 90 day supply. Patient has 2 refills remaining. Next appointment with lipid clinic is set for recall in June 2026.    Insurance report was not up to date. No action needed at this time.  Will continue to follow adherence thru 2025 - next due to refill metformin  and losartan  in December.     Madelin Ray, PharmD Clinical Pharmacist [facility]  Population Health [phone]

## 2024-07-13 ENCOUNTER — Encounter (HOSPITAL_COMMUNITY): Payer: Self-pay

## 2024-07-13 ENCOUNTER — Ambulatory Visit (HOSPITAL_COMMUNITY)
Admission: RE | Admit: 2024-07-13 | Discharge: 2024-07-13 | Disposition: A | Discharge status: Home / Self Care | Source: Ambulatory Visit | Attending: Internal Medicine | Admitting: Internal Medicine

## 2024-07-13 DIAGNOSIS — C3411 Malignant neoplasm of upper lobe, right bronchus or lung: Secondary | ICD-10-CM | POA: Diagnosis present

## 2024-07-13 DIAGNOSIS — I700 Atherosclerosis of aorta: Secondary | ICD-10-CM | POA: Diagnosis not present

## 2024-07-13 DIAGNOSIS — C3490 Malignant neoplasm of unspecified part of unspecified bronchus or lung: Secondary | ICD-10-CM | POA: Diagnosis not present

## 2024-07-13 DIAGNOSIS — J439 Emphysema, unspecified: Secondary | ICD-10-CM | POA: Diagnosis not present

## 2024-07-13 LAB — POCT I-STAT CREATININE: Creatinine, Ser: 1.2 mg/dL (ref 0.61–1.24)

## 2024-07-13 MED ORDER — SODIUM CHLORIDE (PF) 0.9 % IJ SOLN
INTRAMUSCULAR | Status: AC
Start: 2024-07-13 — End: 2024-07-13
  Filled 2024-07-13: qty 50

## 2024-07-13 MED ORDER — IOHEXOL 300 MG/ML  SOLN
75.0000 mL | Freq: Once | INTRAMUSCULAR | Status: AC | PRN
  Administered 2024-07-13: 75 mL via INTRAVENOUS

## 2024-07-16 ENCOUNTER — Encounter: Payer: Self-pay | Admitting: Internal Medicine

## 2024-07-16 ENCOUNTER — Ambulatory Visit
Admission: RE | Admit: 2024-07-16 | Discharge: 2024-07-16 | Disposition: A | Source: Ambulatory Visit | Attending: Radiation Oncology | Admitting: Radiation Oncology

## 2024-07-16 ENCOUNTER — Encounter: Payer: Self-pay | Admitting: Physician Assistant

## 2024-07-16 DIAGNOSIS — C3432 Malignant neoplasm of lower lobe, left bronchus or lung: Secondary | ICD-10-CM

## 2024-07-16 DIAGNOSIS — C3490 Malignant neoplasm of unspecified part of unspecified bronchus or lung: Secondary | ICD-10-CM | POA: Diagnosis not present

## 2024-07-16 MED ORDER — GADOPICLENOL 0.5 MMOL/ML IV SOLN
8.0000 mL | Freq: Once | INTRAVENOUS | Status: AC | PRN
  Administered 2024-07-16: 8 mL via INTRAVENOUS

## 2024-07-18 ENCOUNTER — Inpatient Hospital Stay: Attending: Internal Medicine | Admitting: Internal Medicine

## 2024-07-18 ENCOUNTER — Other Ambulatory Visit: Payer: Self-pay | Admitting: Radiation Oncology

## 2024-07-18 ENCOUNTER — Ambulatory Visit: Payer: Self-pay | Admitting: Radiation Oncology

## 2024-07-18 VITALS — BP 130/61 | HR 88 | Temp 98.0°F | Resp 17 | Ht 68.0 in | Wt 182.0 lb

## 2024-07-18 DIAGNOSIS — C3432 Malignant neoplasm of lower lobe, left bronchus or lung: Secondary | ICD-10-CM | POA: Diagnosis present

## 2024-07-18 DIAGNOSIS — Z79899 Other long term (current) drug therapy: Secondary | ICD-10-CM | POA: Diagnosis not present

## 2024-07-18 DIAGNOSIS — R053 Chronic cough: Secondary | ICD-10-CM | POA: Insufficient documentation

## 2024-07-18 DIAGNOSIS — Z902 Acquired absence of lung [part of]: Secondary | ICD-10-CM | POA: Insufficient documentation

## 2024-07-18 DIAGNOSIS — Z923 Personal history of irradiation: Secondary | ICD-10-CM | POA: Insufficient documentation

## 2024-07-18 DIAGNOSIS — C3490 Malignant neoplasm of unspecified part of unspecified bronchus or lung: Secondary | ICD-10-CM | POA: Diagnosis not present

## 2024-07-18 DIAGNOSIS — C3411 Malignant neoplasm of upper lobe, right bronchus or lung: Secondary | ICD-10-CM

## 2024-07-18 DIAGNOSIS — Z9221 Personal history of antineoplastic chemotherapy: Secondary | ICD-10-CM | POA: Diagnosis not present

## 2024-07-18 DIAGNOSIS — E1142 Type 2 diabetes mellitus with diabetic polyneuropathy: Secondary | ICD-10-CM | POA: Insufficient documentation

## 2024-07-18 DIAGNOSIS — F17210 Nicotine dependence, cigarettes, uncomplicated: Secondary | ICD-10-CM | POA: Insufficient documentation

## 2024-07-18 NOTE — Progress Notes (Signed)
 [facility] [phone]) [phone]   [phone]  OFFICE PROGRESS NOTE  [name], MD [address] [place] [address]  DIAGNOSIS:   1) stage IA (T1c, N0, M0) small cell lung cancer presented with left lower lobe lung nodule diagnosed in May 2025.  2) Stage IA (T1c, N0, M0) non-small cell lung cancer, squamous cell carcinoma presented with right upper lobe in July 2023   PRIOR THERAPY:  1) Status post right upper lobectomy with lymph node sampling under the care of Dr. Shyrl on 05/10/2022 with tumor size of 2.3 cm.  2) Systemic chemotherapy with carboplatin  for AUC of 5 on day 1 and etoposide  100 Mg/M2 on days 1, 2 and 3 every 3 weeks.  First dose 01/23/2024.  This is concurrent with SBRT to the left lower lobe lung nodule under the care of Dr. Dewey.  Status post 4 cycles.  CURRENT THERAPY: Observation.  INTERVAL HISTORY: Gregory Jensen 68 y.o. male returns to the clinic today for follow-up visit. Discussed the use of AI scribe software for clinical note transcription with the patient, who gave verbal consent to proceed.  History of Present Illness Gregory Jensen is a 68 year old male with stage 1A small cell lung cancer and stage 1A non-small cell lung cancer who presents for evaluation with repeat CT scan of the chest and MRI of the brain.  He has a history of stage 1A small cell lung cancer, initially presenting with a left lower lobe lung nodule in May 2025. He underwent curative systemic chemotherapy with carboplatin  and etoposide  for four cycles, concurrent with stereotactic body radiation therapy (SPRT). He is currently in an observation period and is here for evaluation with repeat imaging studies.  He also has a history of stage 1A non-small cell lung cancer, specifically squamous cell carcinoma, involving the right upper lobe. This was treated with a right upper lobectomy in July 2023.  He has a dry cough that 'comes from the top  of my throat' and continues to smoke, which he associates with the cough. No new complaints or significant changes in his condition.  He experiences neuropathy, which he describes as worsening. He was previously prescribed gabapentin , taking 100 mg at night, but reports no significant improvement. He also has diabetes, which he acknowledges as a contributing factor to his neuropathy.  He occasionally experiences cramping, which he describes as not concerning.    MEDICAL HISTORY: Past Medical History:  Diagnosis Date   Cancer (HCC) 4 years ago   Prostate Cancer , Lung Cancer   Diabetes mellitus without complication (HCC)    type 2   Hyperlipidemia    Hypertension    Lung cancer (HCC)    Left Lower Lobe Lung   Neuromuscular disorder (HCC)    neuropathy   Neuropathy    in legs causing balance issues per patient   Sleep apnea    Smoker     ALLERGIES:  is allergic to jardiance  [empagliflozin ].  MEDICATIONS:  Current Outpatient Medications  Medication Sig Dispense Refill   aspirin  81 MG EC tablet Take 81 mg by mouth in the morning.     atorvastatin  (LIPITOR) 40 MG tablet TAKE 1 TABLET BY MOUTH EVERY DAY 90 tablet 2   Blood Glucose Monitoring Suppl (ONE TOUCH ULTRA 2) w/Device KIT Used TID to check glucose or PRN Code E11.9 1 kit 0   cyanocobalamin  1000 MCG tablet Take 1,000 mcg by mouth daily.  fenofibrate  (TRICOR ) 145 MG tablet TAKE 1 TABLET BY MOUTH EVERY DAY 90 tablet 2   gabapentin  (NEURONTIN ) 100 MG capsule TAKE 1-2 CAPSULES (100-200 MG TOTAL) BY MOUTH AT BEDTIME. 60 capsule 1   glucose blood (ONETOUCH VERIO) test strip 1 each by Other route daily as needed for other. Use as instructed 100 each 4   ibuprofen (ADVIL) 200 MG tablet Take 400-600 mg by mouth daily as needed (pain.).     losartan  (COZAAR ) 50 MG tablet TAKE 1 TABLET BY MOUTH EVERY DAY 90 tablet 3   metFORMIN  (GLUCOPHAGE ) 500 MG tablet TAKE 1 TABLET BY MOUTH TWICE A DAY WITH FOOD 180 tablet 1   ondansetron   (ZOFRAN ) 8 MG tablet Take 1 tablet (8 mg total) by mouth every 8 (eight) hours as needed for nausea or vomiting. Start on third day after chemotherapy. 30 tablet 1   OneTouch Delica Lancets 33G MISC 1 each by Does not apply route daily as needed. 100 each 4   oxyCODONE -acetaminophen  (PERCOCET/ROXICET) 5-325 MG tablet Take 1 tablet by mouth every 8 (eight) hours as needed for severe pain (pain score 7-10). 30 tablet 0   prochlorperazine  (COMPAZINE ) 10 MG tablet Take 1 tablet (10 mg total) by mouth every 6 (six) hours as needed for nausea or vomiting (Nausea or vomiting). 30 tablet 1   No current facility-administered medications for this visit.    SURGICAL HISTORY:  Past Surgical History:  Procedure Laterality Date   BACK SURGERY  15 years ago   BRONCHIAL BIOPSY  05/10/2022   Procedure: BRONCHIAL BIOPSIES;  Surgeon: Shelah Lamar RAMAN, MD;  Location: [facility] ENDOSCOPY;  Service: Pulmonary;;   BRONCHIAL BIOPSY  12/26/2023   Procedure: BRONCHOSCOPY, WITH BIOPSY;  Surgeon: Shelah Lamar RAMAN, MD;  Location: [facility] ENDOSCOPY;  Service: Pulmonary;;   BRONCHIAL BRUSHINGS  05/10/2022   Procedure: BRONCHIAL BRUSHINGS;  Surgeon: Shelah Lamar RAMAN, MD;  Location: [facility] ENDOSCOPY;  Service: Pulmonary;;   BRONCHIAL BRUSHINGS  12/26/2023   Procedure: BRONCHOSCOPY, WITH BRUSH BIOPSY;  Surgeon: Shelah Lamar RAMAN, MD;  Location: [facility] ENDOSCOPY;  Service: Pulmonary;;   BRONCHIAL NEEDLE ASPIRATION BIOPSY  05/10/2022   Procedure: BRONCHIAL NEEDLE ASPIRATION BIOPSIES;  Surgeon: Shelah Lamar RAMAN, MD;  Location: [facility] ENDOSCOPY;  Service: Pulmonary;;   BRONCHIAL NEEDLE ASPIRATION BIOPSY  12/26/2023   Procedure: BRONCHOSCOPY, WITH NEEDLE ASPIRATION BIOPSY;  Surgeon: Shelah Lamar RAMAN, MD;  Location: [facility] ENDOSCOPY;  Service: Pulmonary;;   COLONOSCOPY  2007   pt does not know MD name/normal exam per pt.   FIDUCIAL MARKER PLACEMENT  05/10/2022   Procedure: FIDUCIAL DYE MARKING;  Surgeon: Shelah Lamar RAMAN, MD;  Location: [facility] ENDOSCOPY;  Service:  Pulmonary;;   FIDUCIAL MARKER PLACEMENT  12/26/2023   Procedure: INSERTION, FIDUCIAL MARKERS;  Surgeon: Shelah Lamar RAMAN, MD;  Location: [facility] ENDOSCOPY;  Service: Pulmonary;;   INTERCOSTAL NERVE BLOCK Right 05/10/2022   Procedure: INTERCOSTAL NERVE BLOCK;  Surgeon: Shyrl Linnie KIDD, MD;  Location: [facility] OR;  Service: Thoracic;  Laterality: Right;   NODE DISSECTION Right 05/10/2022   Procedure: NODE DISSECTION;  Surgeon: Shyrl Linnie KIDD, MD;  Location: [facility] OR;  Service: Thoracic;  Laterality: Right;   PROSTATECTOMY  4 years ago   VIDEO BRONCHOSCOPY WITH ENDOBRONCHIAL NAVIGATION Left 12/26/2023   Procedure: VIDEO BRONCHOSCOPY WITH ENDOBRONCHIAL NAVIGATION;  Surgeon: Shelah Lamar RAMAN, MD;  Location: [facility] ENDOSCOPY;  Service: Pulmonary;  Laterality: Left;    REVIEW OF SYSTEMS:  Constitutional: negative Eyes: negative Ears, nose, mouth, throat, and face: negative Respiratory: positive for cough  Cardiovascular: negative Gastrointestinal: negative Genitourinary:negative Integument/breast: negative Hematologic/lymphatic: negative Musculoskeletal:negative Neurological: positive for paresthesia Behavioral/Psych: negative Endocrine: negative Allergic/Immunologic: negative   PHYSICAL EXAMINATION: General appearance: alert, cooperative, fatigued, and no distress Head: Normocephalic, without obvious abnormality, atraumatic Neck: no adenopathy, no JVD, supple, symmetrical, trachea midline, and thyroid  not enlarged, symmetric, no tenderness/mass/nodules Lymph nodes: Cervical, supraclavicular, and axillary nodes normal. Resp: clear to auscultation bilaterally Back: symmetric, no curvature. ROM normal. No CVA tenderness. Cardio: regular rate and rhythm, S1, S2 normal, no murmur, click, rub or gallop GI: soft, non-tender; bowel sounds normal; no masses,  no organomegaly Extremities: extremities normal, atraumatic, no cyanosis or edema Neurologic: Alert and oriented X 3, normal strength and tone. Normal  symmetric reflexes. Normal coordination and gait  ECOG PERFORMANCE STATUS: 1 - Symptomatic but completely ambulatory  Blood pressure 130/61, pulse 88, temperature 98 F (36.7 C), temperature source Temporal, resp. rate 17, height 5' 8 (1.727 m), weight 182 lb (82.6 kg), SpO2 95%.  LABORATORY DATA: Lab Results  Component Value Date   WBC 15.5 (H) 04/18/2024   HGB 10.1 (L) 04/18/2024   HCT 29.6 (L) 04/18/2024   MCV 97.0 04/18/2024   PLT 200 04/18/2024      Chemistry      Component Value Date/Time   NA 140 04/18/2024 0957   NA 138 04/27/2022 0847   K 3.7 04/18/2024 0957   CL 106 04/18/2024 0957   CO2 23 04/18/2024 0957   BUN 21 04/18/2024 0957   BUN 19 04/27/2022 0847   CREATININE 1.20 07/13/2024 1029   CREATININE 1.12 04/18/2024 0957   CREATININE 0.97 07/23/2020 0827      Component Value Date/Time   CALCIUM  9.3 04/18/2024 0957   ALKPHOS 90 04/18/2024 0957   AST 18 04/18/2024 0957   ALT 19 04/18/2024 0957   BILITOT 0.5 04/18/2024 0957       RADIOGRAPHIC STUDIES: MR Brain W Wo Contrast Result Date: 07/17/2024 EXAM: MRI BRAIN WITH AND WITHOUT CONTRAST 07/16/2024 09:49:54 AM TECHNIQUE: Multiplanar multisequence MRI of the head/brain was performed with and without the administration of intravenous contrast. COMPARISON: MRI Brain 05/09/2024. CLINICAL HISTORY: Small cell lung cancer (SCLC), monitor. FINDINGS: BRAIN AND VENTRICLES: No acute infarct. No acute intracranial hemorrhage. No mass effect or midline shift. No hydrocephalus. The sella is unremarkable. Normal flow voids. No mass or abnormal enhancement. Multifocal hyperintense T2-weighted signal within the cerebral white matter, most commonly due to chronic small vessel disease. ORBITS: No acute abnormality. SINUSES: No acute abnormality. BONES AND SOFT TISSUES: Normal bone marrow signal and enhancement. No acute soft tissue abnormality. IMPRESSION: 1. No acute intracranial abnormality. 2. Multifocal T2 hyperintense white  matter signal most consistent with chronic small vessel disease. Electronically signed by: Franky Stanford MD 07/17/2024 08:01 PM EST RP Workstation: [id number]   CT Chest W Contrast Result Date: 07/17/2024 CLINICAL DATA:  Restaging non-small cell lung cancer. EXAM: CT CHEST WITH CONTRAST TECHNIQUE: Multidetector CT imaging of the chest was performed during intravenous contrast administration. RADIATION DOSE REDUCTION: This exam was performed according to the departmental dose-optimization program which includes automated exposure control, adjustment of the mA and/or kV according to patient size and/or use of iterative reconstruction technique. CONTRAST:  75mL OMNIPAQUE  IOHEXOL  300 MG/ML  SOLN COMPARISON:  Chest CT 04/12/2024 FINDINGS: Cardiovascular: The heart is normal in size. No pericardial effusion. Stable tortuosity and calcification of the thoracic aorta. No dissection. Stable age advanced three-vessel coronary artery calcifications. Mediastinum/Nodes: Stable scattered mediastinal and hilar lymph nodes. No new or progressive changes. The  esophagus is grossly normal. Lungs/Pleura: Surgical changes from a prior right upper lobe lobectomy. No findings suspicious for recurrent tumor. Stable underlying emphysematous changes and areas of pulmonary scarring. 5 mm left lower lobe 115/8 is stable. No new pulmonary nodules are identified. No new pulmonary lesions. No pleural effusion or pleural lesions. Upper Abdomen: No significant upper abdominal findings. No hepatic or adrenal gland lesions. Stable mild thickening of the medial limb of the left adrenal gland. Stable vascular calcifications. No upper abdominal adenopathy. Musculoskeletal: No chest wall mass, supraclavicular or axillary adenopathy. The bony thorax is intact. IMPRESSION: 1. Surgical changes from a prior right upper lobe lobectomy. No findings suspicious for recurrent tumor. 2. Stable 5 mm left lower lobe pulmonary nodule. 3. Stable scattered  mediastinal and hilar lymph nodes. 4. Stable age advanced three-vessel coronary artery calcifications. Aortic Atherosclerosis (ICD10-I70.0) and Emphysema (ICD10-J43.9). Electronically Signed   By: MYRTIS Stammer M.D.   On: 07/17/2024 17:16     ASSESSMENT AND PLAN: This is a very pleasant 69 years old white male with:  1) stage Ia (T1c, N0, M0) small cell lung cancer presented with left lower lobe lung nodule diagnosed in May 2025. 2) History of stage IA (T1c, N0, M0) non-small cell lung cancer, squamous cell carcinoma presented with right upper lobe in July 2023 status post right upper lobectomy with lymph node sampling under the care of Dr. Shyrl on 05/10/2022 with tumor size of 2.3 cm.  He had repeat PET scan performed recently that showed hypermetabolic left lower lobe lung nodule with no other evidence of metastatic disease to the mediastinal lymph node or distant metastasis. He had repeat bronchoscopy with biopsy of the left lower lobe lung nodule and unfortunately the final pathology was consistent with a small cell carcinoma. He started systemic chemotherapy with carboplatin  for AUC of 5 on day 1 and etoposide  100 Mg/M2 on days 1, 2 and 3 every 3 weeks for 4 cycles concurrent with SBRT to the left lower lobe lung nodule.  First dose was 01/23/2024 He is status post 4 cycle of treatment.  He tolerated his treatment well except for the fatigue. The patient is currently on observation. He had repeat CT scan of the chest as well as MRI of the brain performed recently.  I personally and independently reviewed the imaging studies and discussed the result with the patient today. There is no evidence for disease recurrence or metastasis. Assessment and Plan Assessment & Plan Stage 1A small cell lung cancer, left lower lobe, under observation Currently under observation following completion of palliative systemic chemotherapy with carboplatin  and etoposide  for four cycles, concurrent with SPRT. Recent  CT scan of the chest shows no evidence of growth or spread. - Continue observation - Will schedule chest CT in six months  History of stage 1A non-small cell lung cancer, right upper lobe, status post right upper lobectomy Status post right upper lobectomy in July 2023 for stage 1A non-small cell lung cancer, squamous cell carcinoma involving the right upper lobe.  Peripheral neuropathy, likely multifactorial including diabetes and chemotherapy Peripheral neuropathy has not improved and may have worsened slightly. Likely multifactorial, including diabetes and previous chemotherapy. Currently managed with gabapentin  100 mg at night. - Continue gabapentin  100 mg at night - Consider increasing gabapentin  to 300 mg if symptoms persist  Chronic cough, likely related to tobacco use and possible acid reflux Chronic dry cough, likely related to ongoing tobacco use and possible acid reflux. Cough originates from the throat and is persistent. -  Recommended over-the-counter omeprazole for acid reflux  Tobacco use disorder Continued tobacco use despite history of lung cancer. Smoking cessation is strongly advised to prevent further health complications. - Advised smoking cessation The patient was advised to call immediately if he has any other concerning symptoms in the interval.  The patient voices understanding of current disease status and treatment options and is in agreement with the current care plan.  All questions were answered. The patient knows to call the clinic with any problems, questions or concerns. We can certainly see the patient much sooner if necessary.  The total time spent in the appointment was 30 minutes.  Disclaimer: This note was dictated with voice recognition software. Similar sounding words can inadvertently be transcribed and may not be corrected upon review.

## 2024-07-19 ENCOUNTER — Other Ambulatory Visit: Payer: Self-pay

## 2024-07-20 ENCOUNTER — Ambulatory Visit: Admitting: Family Medicine

## 2024-07-20 ENCOUNTER — Encounter: Payer: Self-pay | Admitting: Family Medicine

## 2024-07-20 VITALS — BP 124/62 | HR 67 | Temp 98.2°F | Ht 68.0 in | Wt 182.4 lb

## 2024-07-20 DIAGNOSIS — Z0001 Encounter for general adult medical examination with abnormal findings: Secondary | ICD-10-CM

## 2024-07-20 DIAGNOSIS — Z0000 Encounter for general adult medical examination without abnormal findings: Secondary | ICD-10-CM | POA: Diagnosis not present

## 2024-07-20 DIAGNOSIS — E1129 Type 2 diabetes mellitus with other diabetic kidney complication: Secondary | ICD-10-CM | POA: Insufficient documentation

## 2024-07-20 DIAGNOSIS — E119 Type 2 diabetes mellitus without complications: Secondary | ICD-10-CM

## 2024-07-20 DIAGNOSIS — E1169 Type 2 diabetes mellitus with other specified complication: Secondary | ICD-10-CM | POA: Diagnosis not present

## 2024-07-20 DIAGNOSIS — E785 Hyperlipidemia, unspecified: Secondary | ICD-10-CM | POA: Diagnosis not present

## 2024-07-20 DIAGNOSIS — Z125 Encounter for screening for malignant neoplasm of prostate: Secondary | ICD-10-CM | POA: Diagnosis not present

## 2024-07-20 DIAGNOSIS — C61 Malignant neoplasm of prostate: Secondary | ICD-10-CM

## 2024-07-20 DIAGNOSIS — I152 Hypertension secondary to endocrine disorders: Secondary | ICD-10-CM | POA: Diagnosis not present

## 2024-07-20 DIAGNOSIS — E1159 Type 2 diabetes mellitus with other circulatory complications: Secondary | ICD-10-CM

## 2024-07-20 DIAGNOSIS — Z7984 Long term (current) use of oral hypoglycemic drugs: Secondary | ICD-10-CM | POA: Diagnosis not present

## 2024-07-20 DIAGNOSIS — E1165 Type 2 diabetes mellitus with hyperglycemia: Secondary | ICD-10-CM | POA: Diagnosis not present

## 2024-07-20 DIAGNOSIS — G6289 Other specified polyneuropathies: Secondary | ICD-10-CM | POA: Diagnosis not present

## 2024-07-20 DIAGNOSIS — R809 Proteinuria, unspecified: Secondary | ICD-10-CM | POA: Diagnosis not present

## 2024-07-20 LAB — LIPID PANEL
Cholesterol: 114 mg/dL (ref 0–200)
HDL: 21.8 mg/dL — ABNORMAL LOW (ref >39.00)
LDL Cholesterol: 38 mg/dL (ref 0–99)
NonHDL: 92.62
Total CHOL/HDL Ratio: 5
Triglycerides: 273 mg/dL — ABNORMAL HIGH (ref 0.0–149.0)
VLDL: 54.6 mg/dL — ABNORMAL HIGH (ref 0.0–40.0)

## 2024-07-20 LAB — MICROALBUMIN / CREATININE URINE RATIO
Creatinine,U: 131.5 mg/dL
Microalb Creat Ratio: 53.1 mg/g — ABNORMAL HIGH (ref 0.0–30.0)
Microalb, Ur: 7 mg/dL — ABNORMAL HIGH (ref 0.0–1.9)

## 2024-07-20 LAB — COMPREHENSIVE METABOLIC PANEL WITH GFR
ALT: 17 U/L (ref 0–53)
AST: 19 U/L (ref 0–37)
Albumin: 4.8 g/dL (ref 3.5–5.2)
Alkaline Phosphatase: 52 U/L (ref 39–117)
BUN: 15 mg/dL (ref 6–23)
CO2: 26 meq/L (ref 19–32)
Calcium: 9.9 mg/dL (ref 8.4–10.5)
Chloride: 103 meq/L (ref 96–112)
Creatinine, Ser: 1.14 mg/dL (ref 0.40–1.50)
GFR: 65.69 mL/min (ref >60.00)
Glucose, Bld: 129 mg/dL — ABNORMAL HIGH (ref 70–99)
Potassium: 4.4 meq/L (ref 3.5–5.1)
Sodium: 139 meq/L (ref 135–145)
Total Bilirubin: 0.5 mg/dL (ref 0.2–1.2)
Total Protein: 7 g/dL (ref 6.0–8.3)

## 2024-07-20 LAB — CBC
HCT: 40 % (ref 39.0–52.0)
Hemoglobin: 13.7 g/dL (ref 13.0–17.0)
MCHC: 34.2 g/dL (ref 30.0–36.0)
MCV: 93.6 fl (ref 78.0–100.0)
Platelets: 185 K/uL (ref 150.0–400.0)
RBC: 4.27 Mil/uL (ref 4.22–5.81)
RDW: 14 % (ref 11.5–15.5)
WBC: 7.1 K/uL (ref 4.0–10.5)

## 2024-07-20 LAB — HEMOGLOBIN A1C: Hgb A1c MFr Bld: 7.2 % — ABNORMAL HIGH (ref 4.6–6.5)

## 2024-07-20 LAB — TSH: TSH: 5.32 u[IU]/mL (ref 0.35–5.50)

## 2024-07-20 LAB — PSA: PSA: 0 ng/mL — ABNORMAL LOW (ref 0.10–4.00)

## 2024-07-20 NOTE — Assessment & Plan Note (Signed)
 Also cardiology.  Check lipids today with labs.  He is on Lipitor 40 mg daily and fenofibrate  145 mcg daily.

## 2024-07-20 NOTE — Assessment & Plan Note (Signed)
 Check urine microalbumin

## 2024-07-20 NOTE — Assessment & Plan Note (Signed)
 Check PSA. ?

## 2024-07-20 NOTE — Assessment & Plan Note (Signed)
 Overall symptoms are manageable though do seem to have worsened slightly recently.  He has seen neurology for this in the past and underwent nerve conduction study which showed sensorimotor polyneuropathy.  His oncologist has him on gabapentin .  We did discuss adjusting dose or having him see a physical therapist to work on balance however he declined.  He will let us  know if he changes his mind.

## 2024-07-20 NOTE — Patient Instructions (Addendum)
 It was very nice to see you today!  VISIT SUMMARY: You had your annual physical exam today. We discussed your ongoing health conditions, including diabetes, neuropathy, lung cancer, and emphysema. Blood work was ordered to monitor various health markers, and we talked about the importance of diet and exercise.  YOUR PLAN: ADULT WELLNESS VISIT: Routine visit with no new concerns. Blood pressure is controlled. -Continue current medications. -Blood work ordered to check A1c, cholesterol, kidney and liver function, electrolytes, thyroid , blood counts, and PSA. -Rechecked urine for proteinuria. -Encouraged to monitor sugar intake and maintain a healthy diet and exercise routine.  TYPE 2 DIABETES MELLITUS WITH DIABETIC KIDNEY DISEASE AND POLYNEUROPATHY: Ongoing management of diabetic kidney disease and neuropathy, which affects balance and muscle loss without significant pain or tingling. -Increased gabapentin  dose. -Referred to physical therapy for balance. -No neurology referral needed. -Monitor diabetes control through blood work.  HISTORY OF MALIGNANT NEOPLASM OF LUNG, POST-TREATMENT, UNDER SURVEILLANCE: Post-treatment with no recurrence on recent CT scan. -Continue surveillance with CT scans every six months. -Recent brain MRI is normal.  MALIGNANT NEOPLASM OF PROSTATE, UNDER SURVEILLANCE: Under surveillance with PSA monitoring. -Continue PSA monitoring.  EMPHYSEMA: Symptoms are manageable with no significant respiratory distress. -No inhaler needed currently. -Monitor respiratory symptoms and consider inhaler if symptoms worsen.  Return in about 6 months (around 01/18/2025) for Follow Up.   Take care, Dr Kennyth  PLEASE NOTE:  If you had any lab tests, please let us  know if you have not heard back within a few days. You may see your results on mychart before we have a chance to review them but we will give you a call once they are reviewed by us .   If we ordered any referrals  today, please let us  know if you have not heard from their office within the next week.   If you had any urgent prescriptions sent in today, please check with the pharmacy within an hour of our visit to make sure the prescription was transmitted appropriately.   Please try these tips to maintain a healthy lifestyle:  Eat at least 3 REAL meals and 1-2 snacks per day.  Aim for no more than 5 hours between eating.  If you eat breakfast, please do so within one hour of getting up.   Each meal should contain half fruits/vegetables, one quarter protein, and one quarter carbs (no bigger than a computer mouse)  Cut down on sweet beverages. This includes juice, soda, and sweet tea.   Drink at least 1 glass of water with each meal and aim for at least 8 glasses per day  Exercise at least 150 minutes every week.

## 2024-07-20 NOTE — Assessment & Plan Note (Signed)
 He is on metformin  500 mg twice daily.  Check A1c.

## 2024-07-20 NOTE — Progress Notes (Signed)
 Chief Complaint:  Gregory Jensen is a 68 y.o. male who presents today for his annual comprehensive physical exam.    Assessment/Plan:  Chronic Problems Addressed Today: Type 2 diabetes mellitus with hyperglycemia (HCC) He is on metformin  500 mg twice daily.  Check A1c.  Dyslipidemia associated with type 2 diabetes mellitus (HCC) Also cardiology.  Check lipids today with labs.  He is on Lipitor 40 mg daily and fenofibrate  145 mcg daily.  Peripheral neuropathy Overall symptoms are manageable though do seem to have worsened slightly recently.  He has seen neurology for this in the past and underwent nerve conduction study which showed sensorimotor polyneuropathy.  His oncologist has him on gabapentin .  We did discuss adjusting dose or having him see a physical therapist to work on balance however he declined.  He will let us  know if he changes his mind.  Prostate cancer [facility]) Check PSA.   Hypertension associated with diabetes (HCC) At goal today on losartan  50 mg daily.  Microalbuminuria due to type 2 diabetes mellitus (HCC) Check urine microalbumin.   Preventative Healthcare: Vaccines declined.  Check labs.  Due for colonoscopy in 2027.  Patient Counseling(The following topics were reviewed and/or handout was given):  -Nutrition: Stressed importance of moderation in sodium/caffeine intake, saturated fat and cholesterol, caloric balance, sufficient intake of fresh fruits, vegetables, and fiber.  -Stressed the importance of regular exercise.   -Substance Abuse: Discussed cessation/primary prevention of tobacco, alcohol, or other drug use; driving or other dangerous activities under the influence; availability of treatment for abuse.   -Injury prevention: Discussed safety belts, safety helmets, smoke detector, smoking near bedding or upholstery.   -Sexuality: Discussed sexually transmitted diseases, partner selection, use of condoms, avoidance of unintended pregnancy and  contraceptive alternatives.   -Dental health: Discussed importance of regular tooth brushing, flossing, and dental visits.  -Health maintenance and immunizations reviewed. Please refer to Health maintenance section.  Return to care in 1 year for next preventative visit.     Subjective:  HPI:  He has no acute complaints today. Patient is here today for his annual physical.  See assessment / plan for status of chronic conditions.  Discussed the use of AI scribe software for clinical note transcription with the patient, who gave verbal consent to proceed.  History of Present Illness Gregory Jensen is a 68 year old male who presents for an annual physical exam.  He has a history of diabetes and neuropathy. His neuropathy affects his balance and has led to muscle loss in his leg, but does not cause pain or tingling. He takes gabapentin  100 mg, two tablets every night, which he feels might help him sleep but does not significantly impact his neuropathy symptoms.  He has a history of lung cancer, for which he received radiation and chemotherapy. He reports that a spot on his lung was treated with radiation and chemotherapy. He had a CT scan and an MRI last week to monitor for any recurrence, particularly in the brain. He will continue to have follow-up scans every six months.  He reports a history of emphysema, which was noted on a CT scan. He does not experience significant symptoms such as cough or shortness of breath, and his breathing is generally manageable.  His diet is consistent with previous habits, and his exercise is minimal. He is aware of the importance of monitoring his sugar intake due to his diabetes. He recalls a previous urine test that showed some protein, which was attributed to  his diabetes. He is fasting today for blood work to check his A1c, cholesterol, kidney function, liver function, electrolytes, thyroid , blood counts, and PSA.       07/20/2024   10:06 AM   Depression screen PHQ 2/9  Decreased Interest 0  Down, Depressed, Hopeless 0  PHQ - 2 Score 0    Health Maintenance Due  Topic Date Due   FOOT EXAM  01/18/2020   HEMOGLOBIN A1C  07/20/2024   Medicare Annual Wellness (AWV)  08/08/2024     ROS: Per HPI, otherwise a complete review of systems was negative.   PMH:  The following were reviewed and entered/updated in epic: Past Medical History:  Diagnosis Date   Cancer (HCC) 4 years ago   Prostate Cancer , Lung Cancer   Diabetes mellitus without complication (HCC)    type 2   Hyperlipidemia    Hypertension    Lung cancer (HCC)    Left Lower Lobe Lung   Neuromuscular disorder (HCC)    neuropathy   Neuropathy    in legs causing balance issues per patient   Sleep apnea    Smoker    Patient Active Problem List   Diagnosis Date Noted   Microalbuminuria due to type 2 diabetes mellitus (HCC) 07/20/2024   Encounter for antineoplastic chemotherapy 02/13/2024   Chemotherapy induced neutropenia 02/06/2024   Spondylosis 01/19/2024   Primary small cell carcinoma of lower lobe of left lung (HCC) 01/10/2024   Pain of left upper extremity 04/01/2023   Hypertension associated with diabetes (HCC) 01/19/2023   Malignant neoplasm of upper lobe of right lung (HCC) 07/20/2022   Lung nodule 05/10/2022   S/P lobectomy of lung 05/10/2022   Pulmonary nodule 03/23/2022   Peripheral neuropathy 01/22/2021   Actinic keratosis 07/23/2020   Joint pain 07/23/2019   Coronary artery calcification 04/19/2018   History of colonic polyps 01/14/2017   Type 2 diabetes mellitus with hyperglycemia (HCC) 07/15/2015   Prostate cancer (HCC) 09/22/2010   Previous back surgery 09/22/2010   Carpal tunnel syndrome 09/22/2010   Nicotine dependence with current use 09/22/2010   Dyslipidemia associated with type 2 diabetes mellitus (HCC) 09/22/2010   Past Surgical History:  Procedure Laterality Date   BACK SURGERY  15 years ago   BRONCHIAL BIOPSY  05/10/2022    Procedure: BRONCHIAL BIOPSIES;  Surgeon: Shelah Lamar RAMAN, MD;  Location: [facility] ENDOSCOPY;  Service: Pulmonary;;   BRONCHIAL BIOPSY  12/26/2023   Procedure: BRONCHOSCOPY, WITH BIOPSY;  Surgeon: Shelah Lamar RAMAN, MD;  Location: [facility] ENDOSCOPY;  Service: Pulmonary;;   BRONCHIAL BRUSHINGS  05/10/2022   Procedure: BRONCHIAL BRUSHINGS;  Surgeon: Shelah Lamar RAMAN, MD;  Location: [facility] ENDOSCOPY;  Service: Pulmonary;;   BRONCHIAL BRUSHINGS  12/26/2023   Procedure: BRONCHOSCOPY, WITH BRUSH BIOPSY;  Surgeon: Shelah Lamar RAMAN, MD;  Location: [facility] ENDOSCOPY;  Service: Pulmonary;;   BRONCHIAL NEEDLE ASPIRATION BIOPSY  05/10/2022   Procedure: BRONCHIAL NEEDLE ASPIRATION BIOPSIES;  Surgeon: Shelah Lamar RAMAN, MD;  Location: [facility] ENDOSCOPY;  Service: Pulmonary;;   BRONCHIAL NEEDLE ASPIRATION BIOPSY  12/26/2023   Procedure: BRONCHOSCOPY, WITH NEEDLE ASPIRATION BIOPSY;  Surgeon: Shelah Lamar RAMAN, MD;  Location: [facility] ENDOSCOPY;  Service: Pulmonary;;   COLONOSCOPY  2007   pt does not know MD name/normal exam per pt.   FIDUCIAL MARKER PLACEMENT  05/10/2022   Procedure: FIDUCIAL DYE MARKING;  Surgeon: Shelah Lamar RAMAN, MD;  Location: [facility] ENDOSCOPY;  Service: Pulmonary;;   FIDUCIAL MARKER PLACEMENT  12/26/2023   Procedure: INSERTION, FIDUCIAL MARKERS;  Surgeon:  Shelah Lamar RAMAN, MD;  Location: [facility] ENDOSCOPY;  Service: Pulmonary;;   INTERCOSTAL NERVE BLOCK Right 05/10/2022   Procedure: INTERCOSTAL NERVE BLOCK;  Surgeon: Shyrl Linnie KIDD, MD;  Location: [facility] OR;  Service: Thoracic;  Laterality: Right;   NODE DISSECTION Right 05/10/2022   Procedure: NODE DISSECTION;  Surgeon: Shyrl Linnie KIDD, MD;  Location: [facility] OR;  Service: Thoracic;  Laterality: Right;   PROSTATECTOMY  4 years ago   VIDEO BRONCHOSCOPY WITH ENDOBRONCHIAL NAVIGATION Left 12/26/2023   Procedure: VIDEO BRONCHOSCOPY WITH ENDOBRONCHIAL NAVIGATION;  Surgeon: Shelah Lamar RAMAN, MD;  Location: [facility] ENDOSCOPY;  Service: Pulmonary;  Laterality: Left;    Family History  Problem  Relation Age of Onset   Alzheimer's disease Mother    Heart disease Father    Hypertension Sister    Heart disease Sister    Hypertension Sister    Stomach cancer Brother    Diabetes Other        family hx   Colon cancer Neg Hx    Colon polyps Neg Hx    Esophageal cancer Neg Hx    Rectal cancer Neg Hx     Medications- reviewed and updated Current Outpatient Medications  Medication Sig Dispense Refill   aspirin  81 MG EC tablet Take 81 mg by mouth in the morning.     atorvastatin  (LIPITOR) 40 MG tablet TAKE 1 TABLET BY MOUTH EVERY DAY 90 tablet 2   Blood Glucose Monitoring Suppl (ONE TOUCH ULTRA 2) w/Device KIT Used TID to check glucose or PRN Code E11.9 1 kit 0   cyanocobalamin  1000 MCG tablet Take 1,000 mcg by mouth daily.     fenofibrate  (TRICOR ) 145 MG tablet TAKE 1 TABLET BY MOUTH EVERY DAY 90 tablet 2   gabapentin  (NEURONTIN ) 100 MG capsule TAKE 1-2 CAPSULES (100-200 MG TOTAL) BY MOUTH AT BEDTIME. 60 capsule 1   glucose blood (ONETOUCH VERIO) test strip 1 each by Other route daily as needed for other. Use as instructed 100 each 4   ibuprofen (ADVIL) 200 MG tablet Take 400-600 mg by mouth daily as needed (pain.).     losartan  (COZAAR ) 50 MG tablet TAKE 1 TABLET BY MOUTH EVERY DAY 90 tablet 3   metFORMIN  (GLUCOPHAGE ) 500 MG tablet TAKE 1 TABLET BY MOUTH TWICE A DAY WITH FOOD 180 tablet 1   ondansetron  (ZOFRAN ) 8 MG tablet Take 1 tablet (8 mg total) by mouth every 8 (eight) hours as needed for nausea or vomiting. Start on third day after chemotherapy. 30 tablet 1   OneTouch Delica Lancets 33G MISC 1 each by Does not apply route daily as needed. 100 each 4   oxyCODONE -acetaminophen  (PERCOCET/ROXICET) 5-325 MG tablet Take 1 tablet by mouth every 8 (eight) hours as needed for severe pain (pain score 7-10). 30 tablet 0   prochlorperazine  (COMPAZINE ) 10 MG tablet Take 1 tablet (10 mg total) by mouth every 6 (six) hours as needed for nausea or vomiting (Nausea or vomiting). 30 tablet 1    No current facility-administered medications for this visit.    Allergies-reviewed and updated Allergies  Allergen Reactions   Jardiance  [Empagliflozin ] Other (See Comments)    Candidal balanitis    Social History   Socioeconomic History   Marital status: Significant Other    Spouse name: Avelina   Number of children: 1   Years of education: 12   Highest education level: Not on file  Occupational History   Occupation: [employer]    Comment: Retired  Tobacco Use  Smoking status: Every Day    Current packs/day: 1.00    Average packs/day: 1 pack/day for 47.0 years (47.0 ttl pk-yrs)    Types: Cigarettes   Smokeless tobacco: Never   Tobacco comments:    1 pack of cigarettes smoked daily 12/23/2023  Vaping Use   Vaping status: Never Used  Substance and Sexual Activity   Alcohol use: Not Currently   Drug use: Never   Sexual activity: Not on file  Other Topics Concern   Not on file  Social History Narrative   Lives with wife. Has one son who lives in [place].    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        Are you right handed or left handed? Right Handed   Are you currently employed ? No- Retired   What is your current occupation? No   Do you live at home alone? No   Who lives with you? Avelina- Significant other   What type of home do you live in: 1 story or 2 story? One story home       Social Drivers of Health   Financial Resource Strain: Low Risk  (08/09/2023)   Overall Financial Resource Strain (CARDIA)    Difficulty of Paying Living Expenses: Not hard at all  Food Insecurity: No Food Insecurity (08/09/2023)   Hunger Vital Sign    Worried About Running Out of Food in the Last Year: Never true    Ran Out of Food in the Last Year: Never true  Transportation Needs: No Transportation Needs (08/09/2023)   PRAPARE - Administrator, Civil Service (Medical): No    Lack of Transportation (Non-Medical): No   Physical Activity: Insufficiently Active (08/09/2023)   Exercise Vital Sign    Days of Exercise per Week: 5 days    Minutes of Exercise per Session: 10 min  Stress: No Stress Concern Present (08/09/2023)   [employer] of Occupational Health - Occupational Stress Questionnaire    Feeling of Stress : Not at all  Social Connections: Moderately Isolated (08/09/2023)   Social Connection and Isolation Panel    Frequency of Communication with Friends and Family: More than three times a week    Frequency of Social Gatherings with Friends and Family: More than three times a week    Attends Religious Services: Never    Database Administrator or Organizations: No    Attends Engineer, Structural: Never    Marital Status: Married        Objective:  Physical Exam: BP 124/62   Pulse 67   Temp 98.2 F (36.8 C) (Temporal)   Ht 5' 8 (1.727 m)   Wt 182 lb 6.4 oz (82.7 kg)   SpO2 95%   BMI 27.73 kg/m   Body mass index is 27.73 kg/m. Wt Readings from Last 3 Encounters:  07/20/24 182 lb 6.4 oz (82.7 kg)  07/18/24 182 lb (82.6 kg)  05/03/24 179 lb 12.8 oz (81.6 kg)   Gen: NAD, resting comfortably HEENT: TMs normal bilaterally. OP clear. No thyromegaly noted.  CV: RRR with no murmurs appreciated Pulm: NWOB, CTAB with no crackles, wheezes, or rhonchi GI: Normal bowel sounds present. Soft, Nontender, Nondistended. MSK: no edema, cyanosis, or clubbing noted Skin: warm, dry Neuro: CN2-12 grossly intact. Strength 5/5 in upper and lower extremities. Reflexes symmetric and intact bilaterally.  Psych: Normal affect and thought content     Christle Nolting M. Kennyth, MD  07/20/2024 10:36 AM

## 2024-07-20 NOTE — Assessment & Plan Note (Signed)
At goal today on losartan 50 mg daily. 

## 2024-07-23 ENCOUNTER — Ambulatory Visit: Payer: Self-pay | Admitting: Family Medicine

## 2024-07-23 NOTE — Progress Notes (Signed)
 A1c mildly elevated to 7.2.  Do not need to make any changes to treatment plan.  We should recheck again in 6 months.  All of his other labs are stable.  Does have mild protein in his urine which is probably due to his diabetes.  Is very important we continue work on good glycemic control.  We can recheck this again in 6-12 months.  Do not need to make any other changes to his treatment plan at this time.

## 2024-07-24 DIAGNOSIS — E113291 Type 2 diabetes mellitus with mild nonproliferative diabetic retinopathy without macular edema, right eye: Secondary | ICD-10-CM | POA: Diagnosis not present

## 2024-07-24 DIAGNOSIS — H35033 Hypertensive retinopathy, bilateral: Secondary | ICD-10-CM | POA: Diagnosis not present

## 2024-07-24 DIAGNOSIS — H524 Presbyopia: Secondary | ICD-10-CM | POA: Diagnosis not present

## 2024-08-06 ENCOUNTER — Other Ambulatory Visit: Payer: Self-pay | Admitting: Physician Assistant

## 2024-08-06 DIAGNOSIS — C3432 Malignant neoplasm of lower lobe, left bronchus or lung: Secondary | ICD-10-CM

## 2024-08-06 DIAGNOSIS — Z5111 Encounter for antineoplastic chemotherapy: Secondary | ICD-10-CM

## 2024-08-13 ENCOUNTER — Ambulatory Visit: Payer: PPO

## 2024-08-13 VITALS — Ht 68.0 in | Wt 182.0 lb

## 2024-08-13 DIAGNOSIS — Z0000 Encounter for general adult medical examination without abnormal findings: Secondary | ICD-10-CM

## 2024-08-13 NOTE — Patient Instructions (Signed)
 Mr. Gregory Jensen,  Thank you for taking the time for your Medicare Wellness Visit. I appreciate your continued commitment to your health goals. Please review the care plan we discussed, and feel free to reach out if I can assist you further.  Please note that Annual Wellness Visits do not include a physical exam. Some assessments may be limited, especially if the visit was conducted virtually. If needed, we may recommend an in-person follow-up with your provider.  Ongoing Care Seeing your primary care provider every 3 to 6 months helps us  monitor your health and provide consistent, personalized care.   Referrals If a referral was made during today's visit and you haven't received any updates within two weeks, please contact the referred provider directly to check on the status.  Recommended Screenings:  Health Maintenance  Topic Date Due   Complete foot exam   01/18/2020   Eye exam for diabetics  07/24/2024   Zoster (Shingles) Vaccine (1 of 2) 10/18/2024*   Flu Shot  11/13/2024*   DTaP/Tdap/Td vaccine (2 - Td or Tdap) 07/20/2025*   Pneumococcal Vaccine for age over 94 (1 of 2 - PCV) 07/20/2025*   Hemoglobin A1C  01/18/2025   Yearly kidney function blood test for diabetes  07/20/2025   Yearly kidney health urinalysis for diabetes  07/20/2025   Medicare Annual Wellness Visit  08/13/2025   Colon Cancer Screening  05/01/2026   Hepatitis C Screening  Completed   Meningitis B Vaccine  Aged Out   Screening for Lung Cancer  Discontinued   COVID-19 Vaccine  Discontinued  *Topic was postponed. The date shown is not the original due date.       08/13/2024    9:47 AM  Advanced Directives  Does Patient Have a Medical Advance Directive? No  Would patient like information on creating a medical advance directive? No - Patient declined    Vision: Annual vision screenings are recommended for early detection of glaucoma, cataracts, and diabetic retinopathy. These exams can also reveal signs of  chronic conditions such as diabetes and high blood pressure.  Dental: Annual dental screenings help detect early signs of oral cancer, gum disease, and other conditions linked to overall health, including heart disease and diabetes.  Please see the attached documents for additional preventive care recommendations.

## 2024-08-13 NOTE — Progress Notes (Signed)
 "  Chief Complaint  Patient presents with   Medicare Wellness     Subjective:   Gregory Jensen is a 68 y.o. male who presents for a Medicare Annual Wellness Visit.  Visit info / Clinical Intake: Medicare Wellness Visit Type:: Subsequent Annual Wellness Visit Persons participating in visit and providing information:: patient Medicare Wellness Visit Mode:: Telephone If telephone:: video declined Since this visit was completed virtually, some vitals may be partially provided or unavailable. Missing vitals are due to the limitations of the virtual format.: Unable to obtain vitals - no equipment If Telephone or Video please confirm:: I connected with patient using audio/video enable telemedicine. I verified patient identity with two identifiers, discussed telehealth limitations, and patient agreed to proceed. Patient Location:: home Provider Location:: office Interpreter Needed?: No Pre-visit prep was completed: yes AWV questionnaire completed by patient prior to visit?: no Living arrangements:: lives with spouse/significant other Patient's Overall Health Status Rating: (!) fair Typical amount of pain: none Does pain affect daily life?: no Are you currently prescribed opioids?: no  Dietary Habits and Nutritional Risks How many meals a day?: 2 Eats fruit and vegetables daily?: (!) no Most meals are obtained by: preparing own meals; eating out In the last 2 weeks, have you had any of the following?: none Diabetic:: (!) yes Any non-healing wounds?: no How often do you check your BS?: as needed Would you like to be referred to a Nutritionist or for Diabetic Management? : no  Functional Status Activities of Daily Living (to include ambulation/medication): Independent Ambulation: Independent with device- listed below Home Assistive Devices/Equipment: [place]; Eyeglasses Medication Administration: Independent Home Management (perform basic housework or laundry): Independent Manage  your own finances?: yes Primary transportation is: driving Concerns about vision?: no *vision screening is required for WTM* Concerns about hearing?: (!) yes Uses hearing aids?: no Hear whispered voice?: yes  Fall Screening Falls in the past year?: 0 Number of falls in past year: 0 Was there an injury with Fall?: 0 Fall Risk Category Calculator: 0 Patient Fall Risk Level: Low Fall Risk  Fall Risk Patient at Risk for Falls Due to: Impaired balance/gait; Impaired mobility Fall risk Follow up: Falls evaluation completed  Home and Transportation Safety: All rugs have non-skid backing?: (!) no All stairs or steps have railings?: yes Grab bars in the bathtub or shower?: (!) no Have non-skid surface in bathtub or shower?: (!) no Good home lighting?: yes Regular seat belt use?: yes Hospital stays in the last year:: no  Cognitive Assessment Difficulty concentrating, remembering, or making decisions? : no Will 6CIT or Mini Cog be Completed: yes What year is it?: 0 points What month is it?: 0 points Give patient an address phrase to remember (5 components): [address] [place]  About what time is it?: 0 points Count backwards from 20 to 1: 0 points Say the months of the year in reverse: 0 points Repeat the address phrase from earlier: 0 points 6 CIT Score: 0 points  Advance Directives (For Healthcare) Does Patient Have a Medical Advance Directive?: No Would patient like information on creating a medical advance directive?: No - Patient declined  Reviewed/Updated  Reviewed/Updated: Reviewed All (Medical, Surgical, Family, Medications, Allergies, Care Teams, Patient Goals)    Allergies (verified) Jardiance  [empagliflozin ]   Current Medications (verified) Outpatient Encounter Medications as of 08/13/2024  Medication Sig   aspirin  81 MG EC tablet Take 81 mg by mouth in the morning.   atorvastatin  (LIPITOR) 40 MG tablet TAKE 1 TABLET BY  MOUTH EVERY DAY   Blood Glucose  Monitoring Suppl (ONE TOUCH ULTRA 2) w/Device KIT Used TID to check glucose or PRN Code E11.9   cyanocobalamin  1000 MCG tablet Take 1,000 mcg by mouth daily.   fenofibrate  (TRICOR ) 145 MG tablet TAKE 1 TABLET BY MOUTH EVERY DAY   gabapentin  (NEURONTIN ) 100 MG capsule TAKE 1-2 CAPSULES (100-200 MG TOTAL) BY MOUTH AT BEDTIME.   glucose blood (ONETOUCH VERIO) test strip 1 each by Other route daily as needed for other. Use as instructed   ibuprofen (ADVIL) 200 MG tablet Take 400-600 mg by mouth daily as needed (pain.).   losartan  (COZAAR ) 50 MG tablet TAKE 1 TABLET BY MOUTH EVERY DAY   metFORMIN  (GLUCOPHAGE ) 500 MG tablet TAKE 1 TABLET BY MOUTH TWICE A DAY WITH FOOD   ondansetron  (ZOFRAN ) 8 MG tablet Take 1 tablet (8 mg total) by mouth every 8 (eight) hours as needed for nausea or vomiting. Start on third day after chemotherapy.   OneTouch Delica Lancets 33G MISC 1 each by Does not apply route daily as needed.   [DISCONTINUED] oxyCODONE -acetaminophen  (PERCOCET/ROXICET) 5-325 MG tablet Take 1 tablet by mouth every 8 (eight) hours as needed for severe pain (pain score 7-10).   [DISCONTINUED] prochlorperazine  (COMPAZINE ) 10 MG tablet Take 1 tablet (10 mg total) by mouth every 6 (six) hours as needed for nausea or vomiting (Nausea or vomiting).   No facility-administered encounter medications on file as of 08/13/2024.    History: Past Medical History:  Diagnosis Date   Cancer (HCC) 4 years ago   Prostate Cancer , Lung Cancer   Diabetes mellitus without complication (HCC)    type 2   Hyperlipidemia    Hypertension    Lung cancer (HCC)    Left Lower Lobe Lung   Neuromuscular disorder (HCC)    neuropathy   Neuropathy    in legs causing balance issues per patient   Sleep apnea    Smoker    Past Surgical History:  Procedure Laterality Date   BACK SURGERY  15 years ago   BRONCHIAL BIOPSY  05/10/2022   Procedure: BRONCHIAL BIOPSIES;  Surgeon: Shelah Lamar RAMAN, MD;  Location[facility]COPY;   Service: Pulmonary;;   BRONCHIAL BIOPSY  12/26/2023   Procedure: BRONCHOSCOPY, WITH BIOPSY;  Surgeon: Shelah Lamar RAMAN, MD;  Location: [facility] ENDOSCOPY;  Service: Pulmonary;;   BRONCHIAL BRUSHINGS  05/10/2022   Procedure: BRONCHIAL BRUSHINGS;  Surgeon: Shelah Lamar RAMAN, MD;  Location: [facility] ENDOSCOPY;  Service: Pulmonary;;   BRONCHIAL BRUSHINGS  12/26/2023   Procedure: BRONCHOSCOPY, WITH BRUSH BIOPSY;  Surgeon: Shelah Lamar RAMAN, MD;  Location: [facility] ENDOSCOPY;  Service: Pulmonary;;   BRONCHIAL NEEDLE ASPIRATION BIOPSY  05/10/2022   Procedure: BRONCHIAL NEEDLE ASPIRATION BIOPSIES;  Surgeon: Shelah Lamar RAMAN, MD;  Location: [facility] ENDOSCOPY;  Service: Pulmonary;;   BRONCHIAL NEEDLE ASPIRATION BIOPSY  12/26/2023   Procedure: BRONCHOSCOPY, WITH NEEDLE ASPIRATION BIOPSY;  Surgeon: Shelah Lamar RAMAN, MD;  Location: [facility] ENDOSCOPY;  Service: Pulmonary;;   COLONOSCOPY  2007   pt does not know MD name/normal exam per pt.   FIDUCIAL MARKER PLACEMENT  05/10/2022   Procedure: FIDUCIAL DYE MARKING;  Surgeon: Shelah Lamar RAMAN, MD;  Location: [facility] ENDOSCOPY;  Service: Pulmonary;;   FIDUCIAL MARKER PLACEMENT  12/26/2023   Procedure: INSERTION, FIDUCIAL MARKERS;  Surgeon: Shelah Lamar RAMAN, MD;  Location: [facility] ENDOSCOPY;  Service: Pulmonary;;   INTERCOSTAL NERVE BLOCK Right 05/10/2022   Procedure: INTERCOSTAL NERVE BLOCK;  Surgeon: Shyrl Linnie KIDD, MD;  Location: [facility]  OR;  Service: Thoracic;  Laterality: Right;   NODE DISSECTION Right 05/10/2022   Procedure: NODE DISSECTION;  Surgeon: Shyrl Linnie KIDD, MD;  Location: [facility] OR;  Service: Thoracic;  Laterality: Right;   PROSTATECTOMY  4 years ago   VIDEO BRONCHOSCOPY WITH ENDOBRONCHIAL NAVIGATION Left 12/26/2023   Procedure: VIDEO BRONCHOSCOPY WITH ENDOBRONCHIAL NAVIGATION;  Surgeon: Shelah Lamar RAMAN, MD;  Location: [facility] ENDOSCOPY;  Service: Pulmonary;  Laterality: Left;   Family History  Problem Relation Age of Onset   Alzheimer's disease Mother    Heart disease Father    Hypertension  Sister    Heart disease Sister    Hypertension Sister    Stomach cancer Brother    Diabetes Other        family hx   Colon cancer Neg Hx    Colon polyps Neg Hx    Esophageal cancer Neg Hx    Rectal cancer Neg Hx    Social History   Occupational History   Occupation: [employer]    Comment: Retired  Tobacco Use   Smoking status: Every Day    Current packs/day: 1.00    Average packs/day: 1 pack/day for 47.0 years (47.0 ttl pk-yrs)    Types: Cigarettes   Smokeless tobacco: Never   Tobacco comments:    1 pack of cigarettes smoked daily 12/23/2023  Vaping Use   Vaping status: Never Used  Substance and Sexual Activity   Alcohol use: Not Currently   Drug use: Never   Sexual activity: Not on file   Tobacco Counseling Ready to quit: Not Answered Counseling given: Not Answered Tobacco comments: 1 pack of cigarettes smoked daily 12/23/2023  SDOH Screenings   Food Insecurity: No Food Insecurity (08/13/2024)  Housing: Low Risk (08/13/2024)  Transportation Needs: No Transportation Needs (08/13/2024)  Utilities: Not At Risk (08/13/2024)  Alcohol Screen: Low Risk (08/13/2024)  Depression (PHQ2-9): Low Risk (07/20/2024)  Financial Resource Strain: Low Risk (08/09/2023)  Physical Activity: Inactive (08/13/2024)  Social Connections: Moderately Isolated (08/13/2024)  Stress: No Stress Concern Present (08/13/2024)  Tobacco Use: High Risk (08/13/2024)  Health Literacy: Adequate Health Literacy (08/13/2024)   See flowsheets for full screening details  Depression Screen PHQ 2 & 9 Depression Scale- Over the past 2 weeks, how often have you been bothered by any of the following problems? Little interest or pleasure in doing things: 0 Feeling down, depressed, or hopeless (PHQ Adolescent also includes...irritable): 0 PHQ-2 Total Score: 0     Goals Addressed             This Visit's Progress    quit smoking       Stop smoking  If you wish to quit smoking, help is available. For  free tobacco cessation program offerings call the [facility] at 410-343-9494 or Live Well Line at 820 056 9490. You may also visit www.[employer].com or email livelifewell@[employer] .com for more information on other programs.   You may also call 1-800-QUIT-NOW ((226)306-1827) or visit www.[employer].ch or www.[facility].org for additional resources on smoking cessation.               Objective:    Today's Vitals   08/13/24 0942  Weight: 182 lb (82.6 kg)  Height: 5' 8 (1.727 m)   Body mass index is 27.67 kg/m.  Hearing/Vision screen Hearing Screening - Comments:: Slight hearing loss  Vision Screening - Comments:: Wears rx glasses - up to date with routine eye exams with Dr Shona Amato  Immunizations and Health Maintenance Health Maintenance  Topic Date Due   FOOT EXAM  01/18/2020   OPHTHALMOLOGY EXAM  07/24/2024   Zoster Vaccines- Shingrix (1 of 2) 10/18/2024 (Originally 03/09/1975)   Influenza Vaccine  11/13/2024 (Originally 03/16/2024)   DTaP/Tdap/Td (2 - Td or Tdap) 07/20/2025 (Originally 12/20/2023)   Pneumococcal Vaccine: 50+ Years (1 of 2 - PCV) 07/20/2025 (Originally 03/09/1975)   HEMOGLOBIN A1C  01/18/2025   Diabetic kidney evaluation - eGFR measurement  07/20/2025   Diabetic kidney evaluation - Urine ACR  07/20/2025   Medicare Annual Wellness (AWV)  08/13/2025   Colonoscopy  05/01/2026   Hepatitis C Screening  Completed   Meningococcal B Vaccine  Aged Out   Lung Cancer Screening  Discontinued   COVID-19 Vaccine  Discontinued        Assessment/Plan:  This is a routine wellness examination for Gregory Jensen.  Patient Care Team: [name], MD as PCP - General (Family Medicine) Tobie Tonita POUR, DO as Consulting Physician (Neurology) Elma Shona RAMAN, MD as Attending Physician (Optometry)  I have personally reviewed and noted the following in the patients chart:   Medical and social history Use of alcohol, tobacco or illicit drugs   Current medications and supplements including opioid prescriptions. Functional ability and status Nutritional status Physical activity Advanced directives List of other physicians Hospitalizations, surgeries, and ER visits in previous 12 months Vitals Screenings to include cognitive, depression, and falls Referrals and appointments  No orders of the defined types were placed in this encounter.  In addition, I have reviewed and discussed with patient certain preventive protocols, quality metrics, and best practice recommendations. A written personalized care plan for preventive services as well as general preventive health recommendations were provided to patient.   Ellouise VEAR Haws, LPN   [id number]   Return in about 1 year (around 08/13/2025).  After Visit Summary: (MyChart) Due to this being a telephonic visit, the after visit summary with patients personalized plan was offered to patient via MyChart   Nurse Notes: HM Addressed: Diabetic Foot Exam recommended Pt record request sent to Dr for diabetic eye exam report   "

## 2024-08-14 ENCOUNTER — Other Ambulatory Visit: Payer: Self-pay

## 2024-10-16 ENCOUNTER — Other Ambulatory Visit

## 2025-01-16 ENCOUNTER — Inpatient Hospital Stay

## 2025-01-18 ENCOUNTER — Ambulatory Visit: Admitting: Family Medicine

## 2025-01-23 ENCOUNTER — Inpatient Hospital Stay: Admitting: Internal Medicine

## 2025-08-14 ENCOUNTER — Ambulatory Visit
